# Patient Record
Sex: Female | Born: 1960 | Race: Black or African American | Hispanic: No | Marital: Single | Smoking: Never smoker
Health system: Southern US, Community
[De-identification: ages and names within clinical notes are randomized; demographics above are authoritative.]

## PROBLEM LIST (undated history)

## (undated) DIAGNOSIS — E785 Hyperlipidemia, unspecified: Secondary | ICD-10-CM

## (undated) DIAGNOSIS — E079 Disorder of thyroid, unspecified: Secondary | ICD-10-CM

## (undated) DIAGNOSIS — E119 Type 2 diabetes mellitus without complications: Secondary | ICD-10-CM

## (undated) DIAGNOSIS — E039 Hypothyroidism, unspecified: Secondary | ICD-10-CM

---

## 2005-01-14 ENCOUNTER — Emergency Department: Payer: Self-pay | Admitting: Emergency Medicine

## 2005-01-14 IMAGING — CR DG CHEST 2V
1 series · 2 of 2 positions shown · non-contrast
Comparison: none

REASON FOR EXAM: Nausea
COMMENTS:

PROCEDURE:     DXR - DXR CHEST PA (OR AP) AND LATERAL  - [DATE]  [DATE]
RESULT:     Mild interstitial prominence is noted.  Atypical pneumonitis
cannot be excluded.  The lungs are otherwise clear.  No alveolar infiltrates
are noted.  The cardiovascular structures are unremarkable.

[Series 1396: postero_anterior · 0.11mm/px · 2 of 2 slices shown]
[im 1/2]
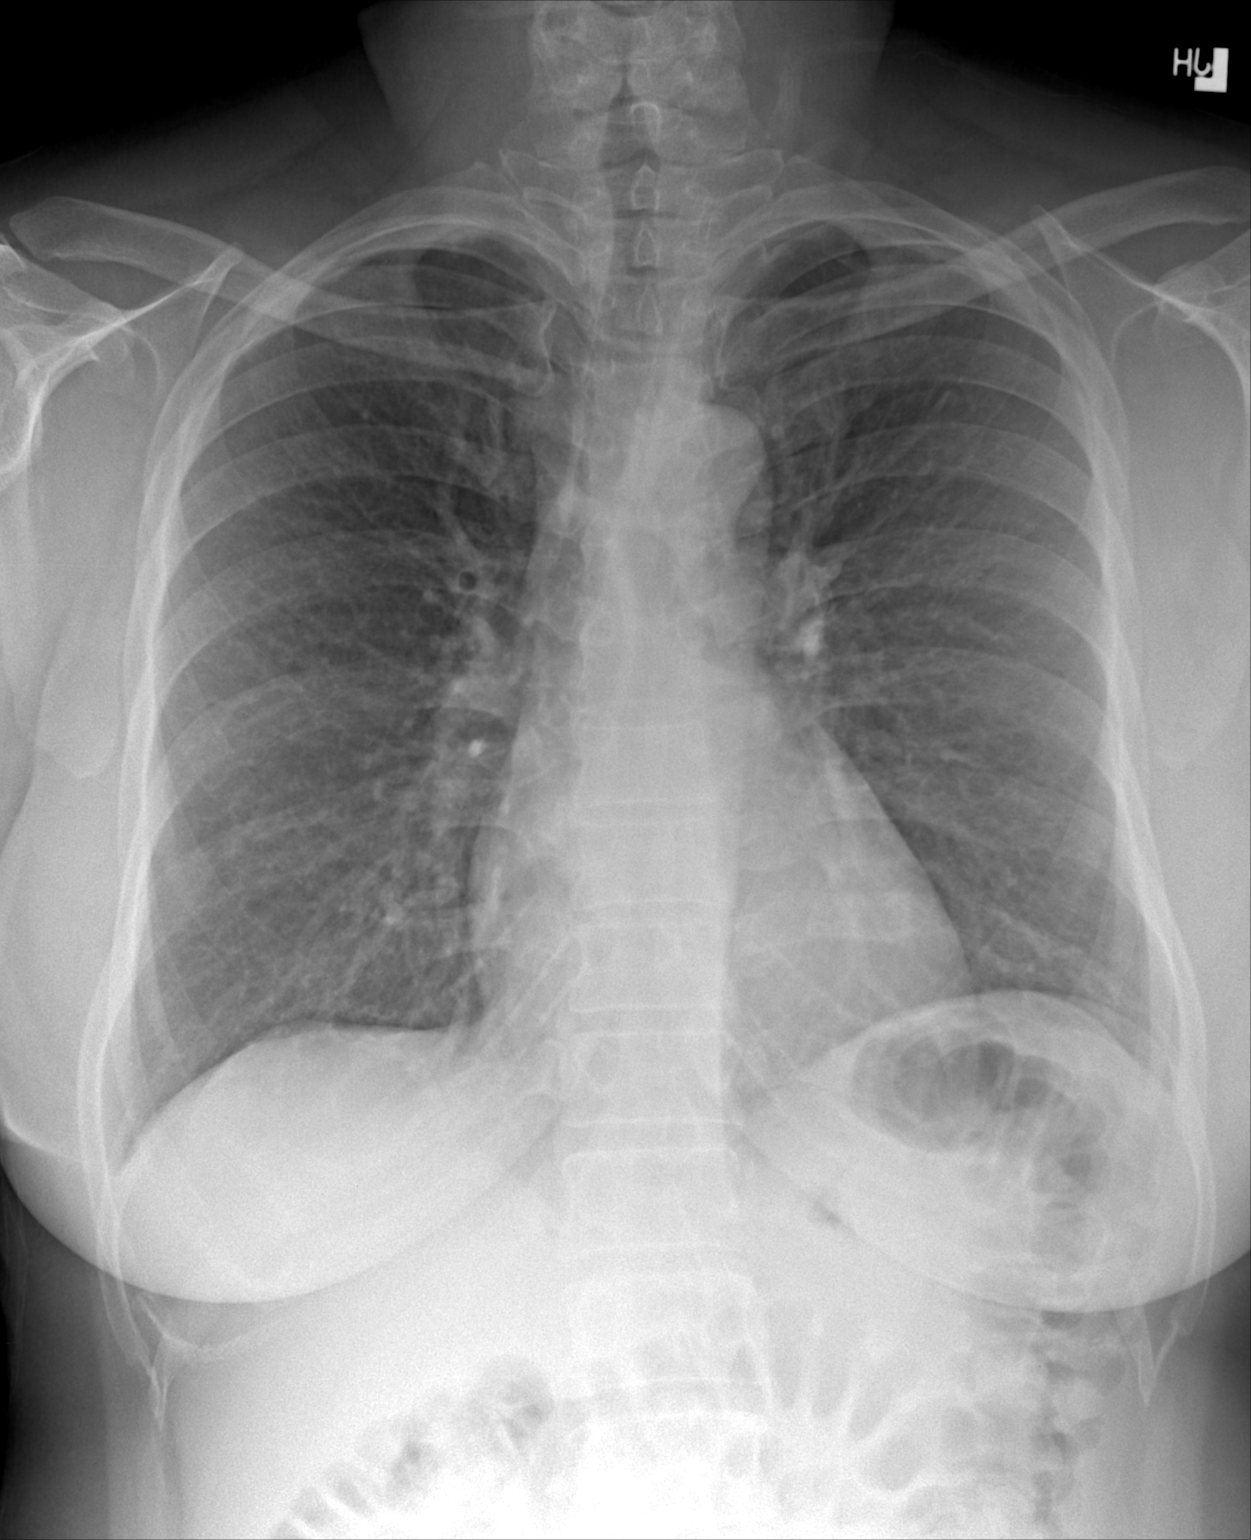
[im 2/2]
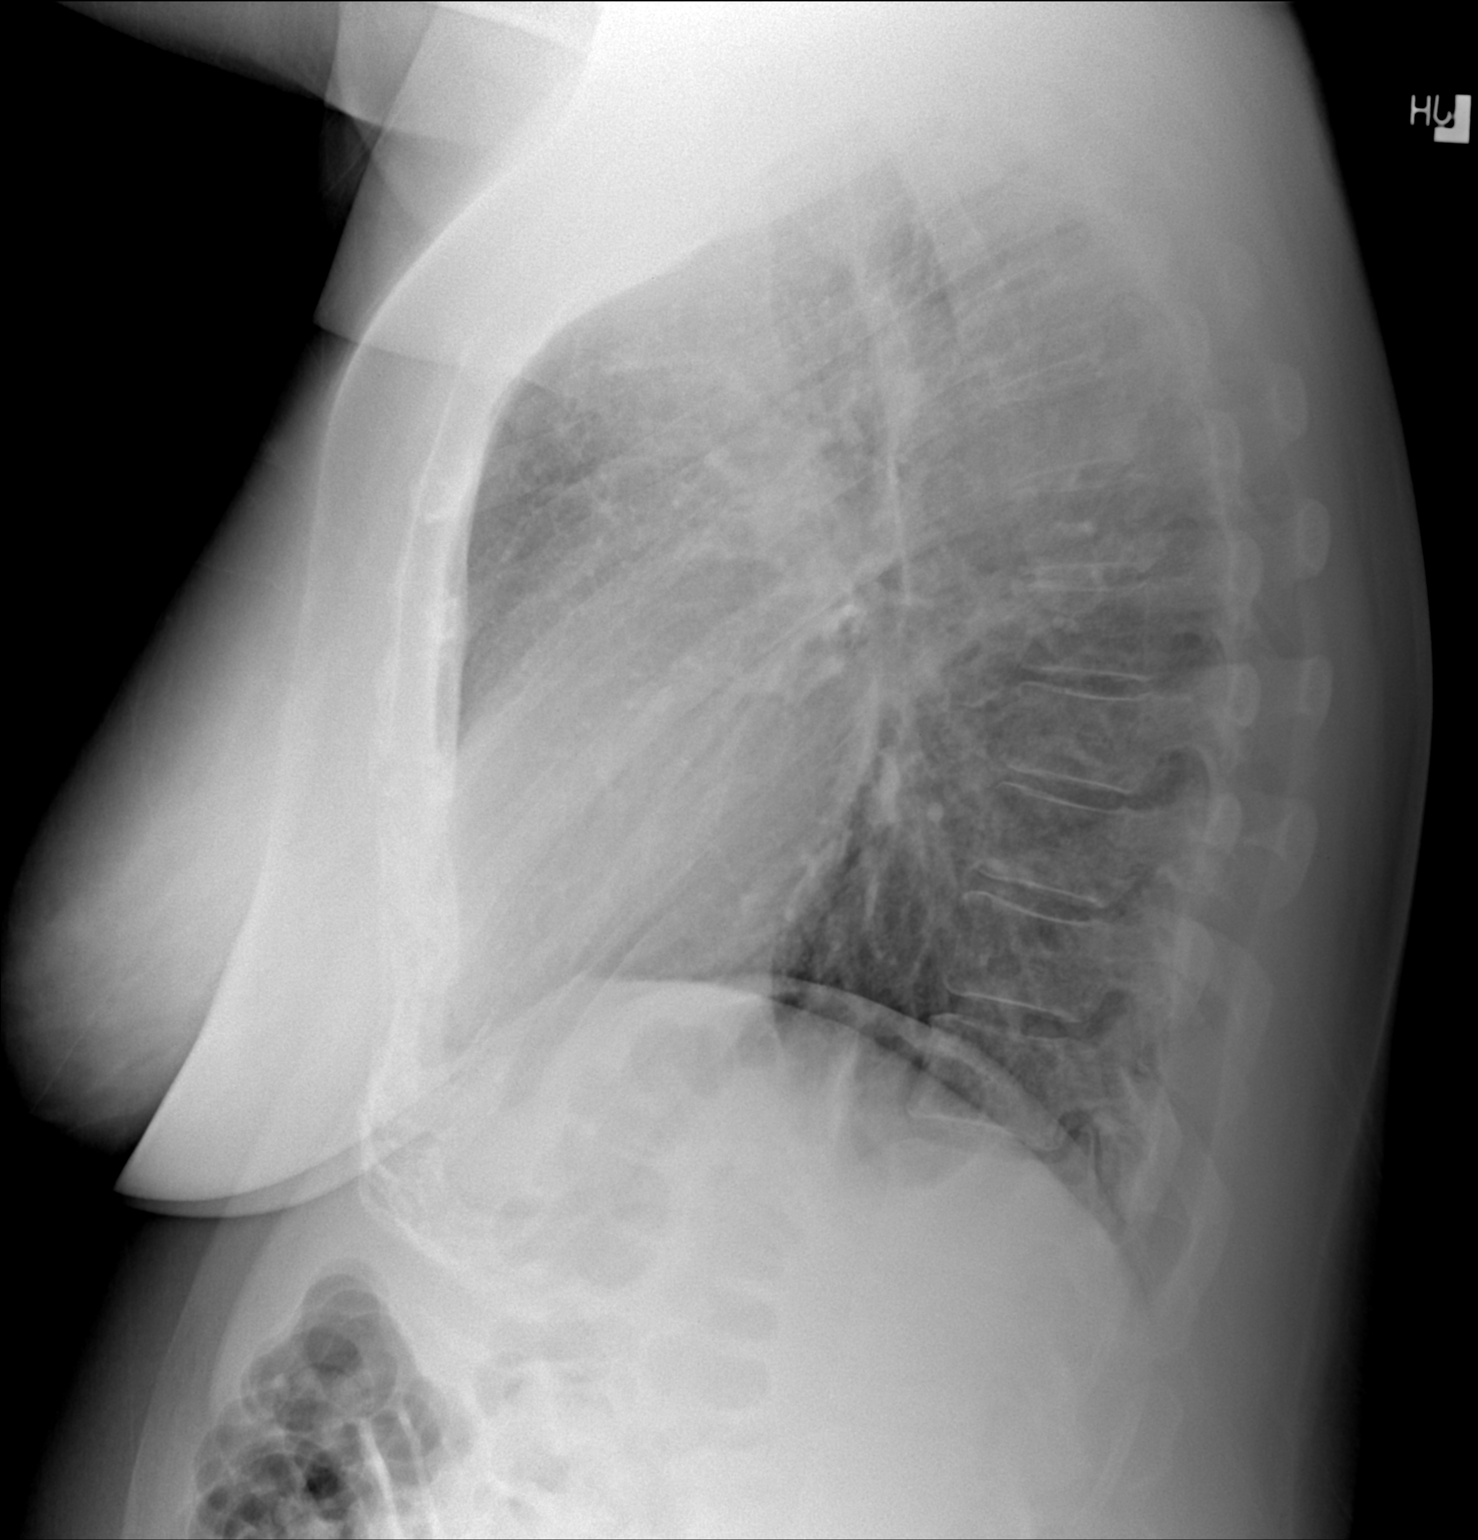

[2 of 2 positions shown; findings below may reference images not displayed]

IMPRESSION: Mild diffuse bilateral interstitial prominence
consistent with atypical pneumonitis.

## 2008-12-30 ENCOUNTER — Emergency Department: Payer: Self-pay | Admitting: Emergency Medicine

## 2010-01-21 ENCOUNTER — Emergency Department: Payer: Self-pay | Admitting: Emergency Medicine

## 2010-01-21 IMAGING — US TRANSABDOMINAL ULTRASOUND OF PELVIS
1 series · 17 of 25 positions shown · non-contrast
Comparison: none

REASON FOR EXAM: menorrhagia
COMMENTS:

[Series 1: transabdominal ultrasound of pelvis · 17 of 30 slices shown]
[im 1/30]
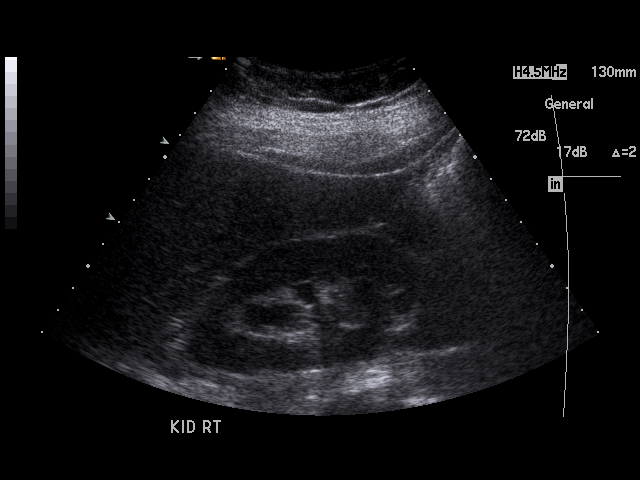
[im 3/30]
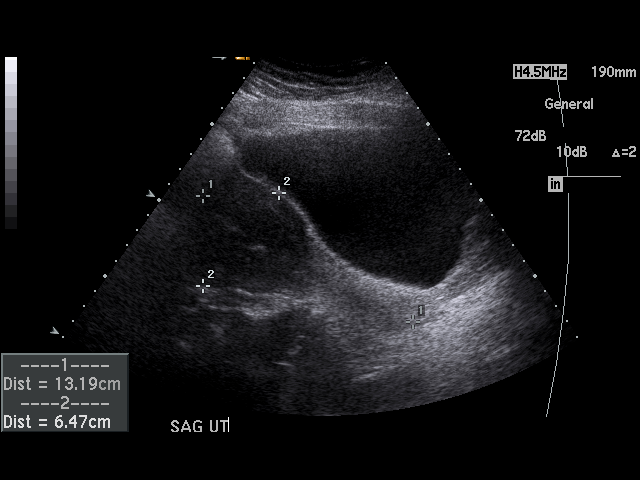
[im 4/30]
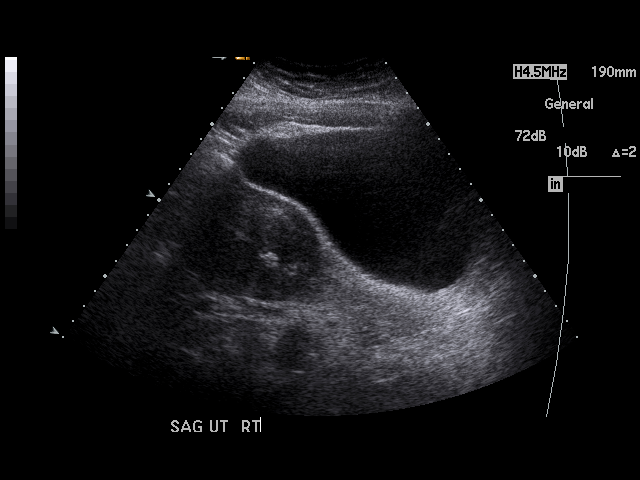
[im 7/30]
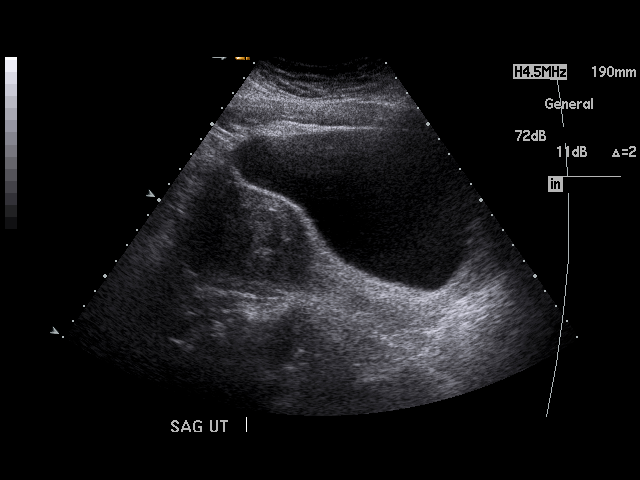
[im 8/30]
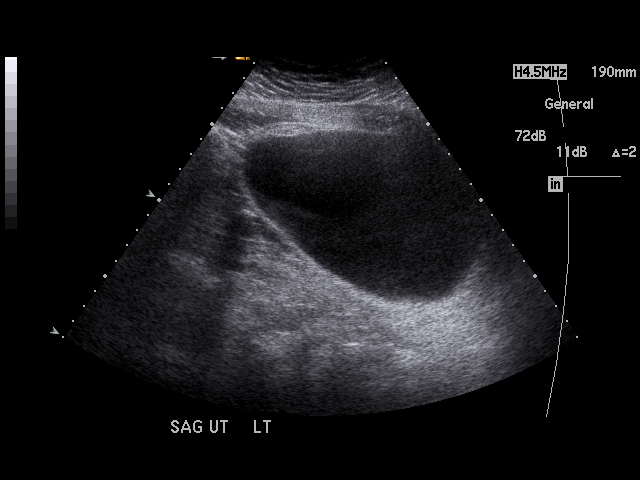
[im 10/30]
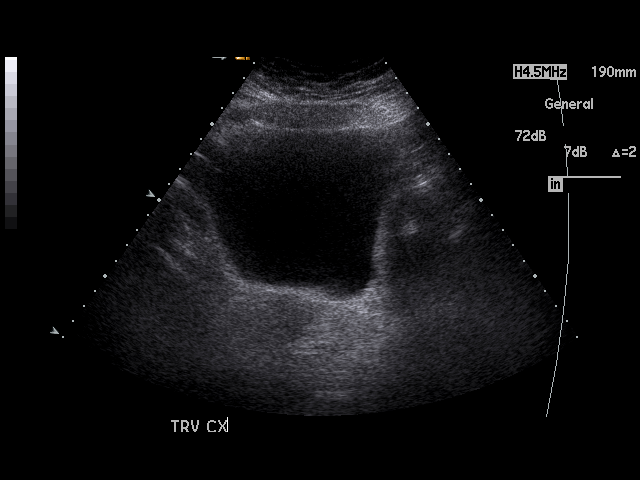
[im 11/30]
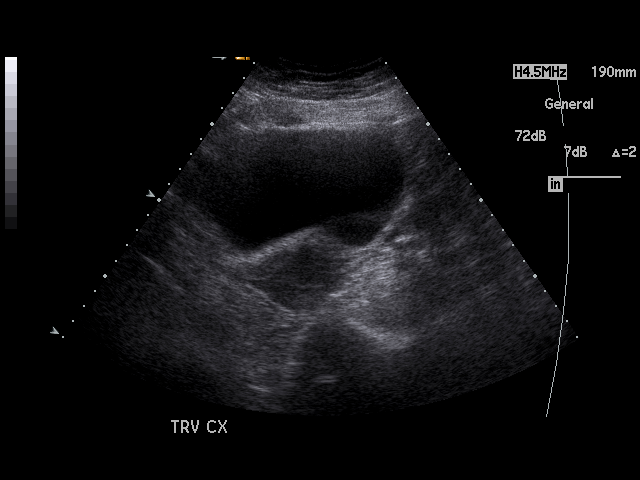
[im 14/30]
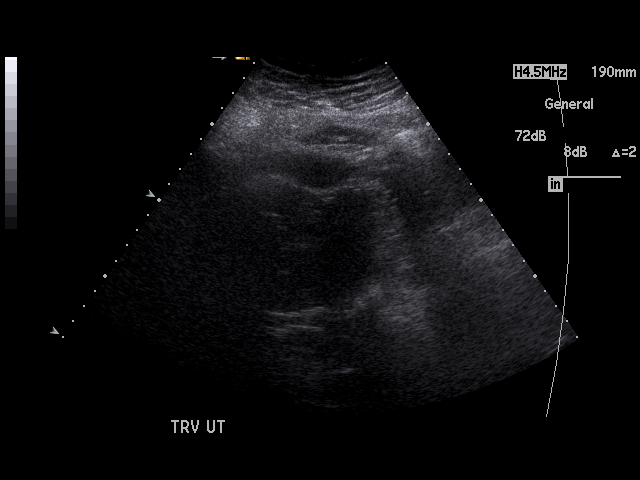
[im 15/30]
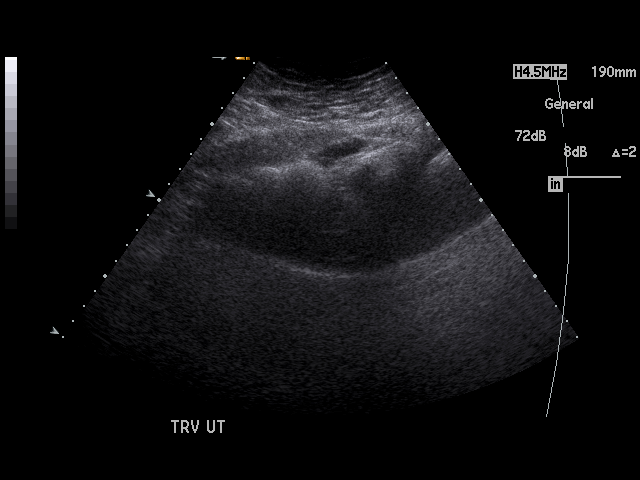
[im 16/30]
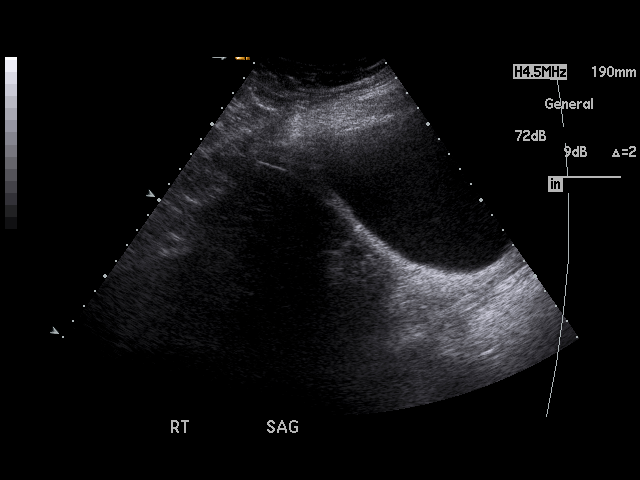
[im 19/30]
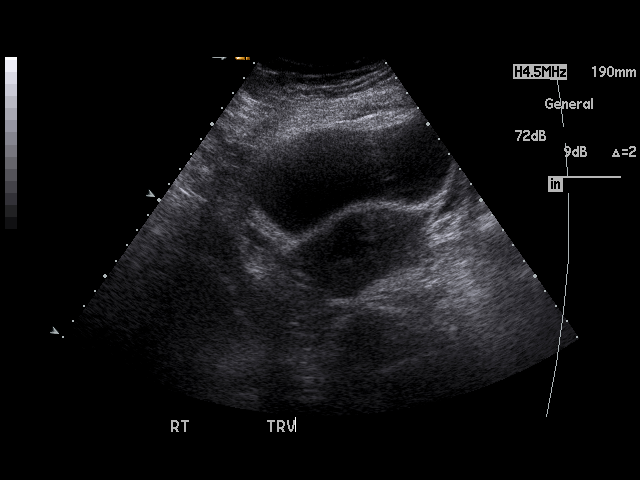
[im 20/30]
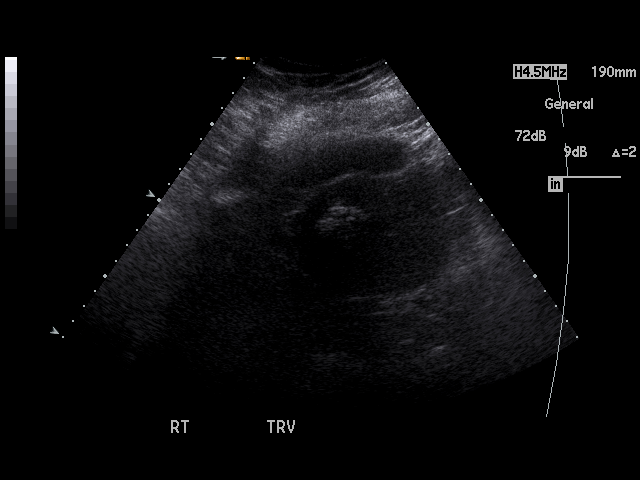
[im 22/30]
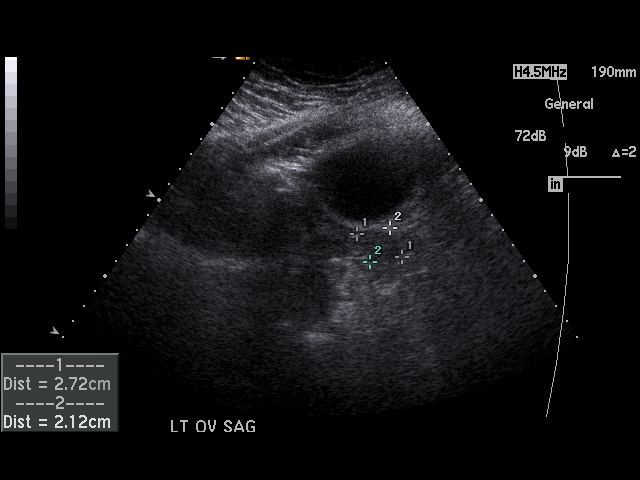
[im 23/30]
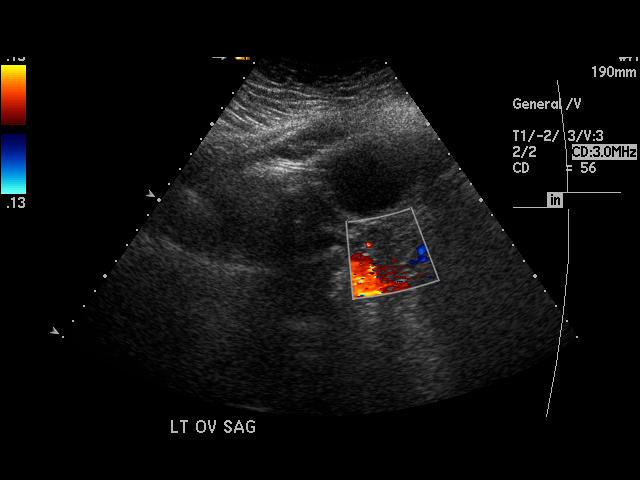
[im 26/30]
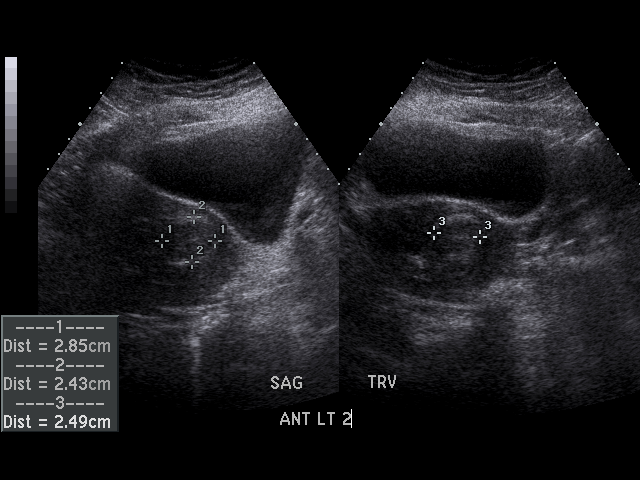
[im 27/30]
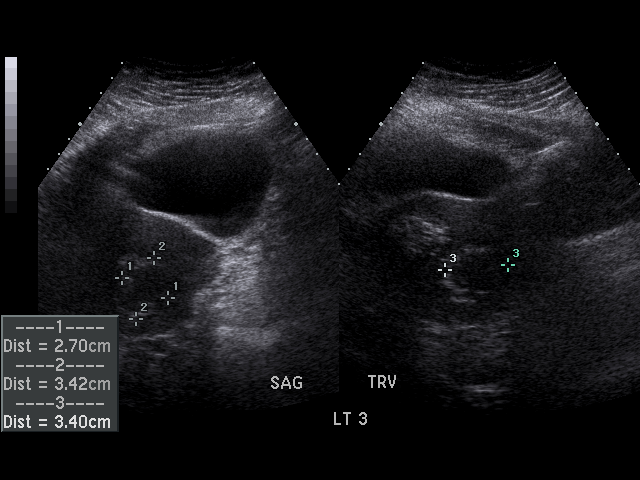
[im 30/30]
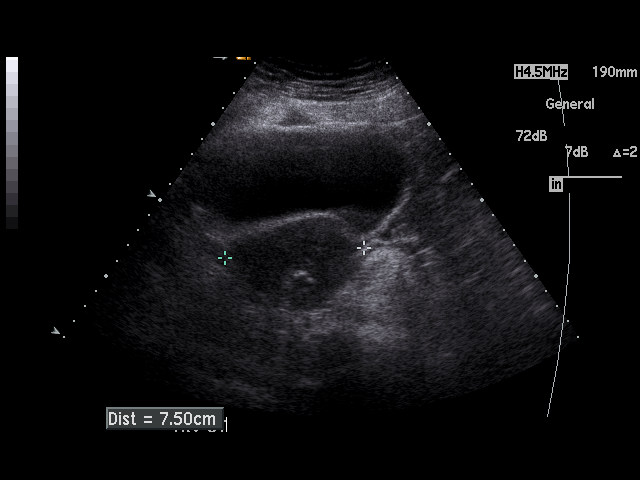

[17 of 25 positions shown; findings below may reference images not displayed]

PROCEDURE:     US  - US PELVIS EXAM  - [DATE]  [DATE]

RESULT:     The uterus is enlarged measuring 13.2 x 6.5 x 7.5 cm. There are
at least 3 hypoechoic foci in the uterine fundus compatible with fibroids.
The endometrial [place] is not abnormally thickened. The right ovary is not
demonstrated. The left ovary is normal in appearance measuring 2.7 x 2.1 x
2.5 cm. There is no free fluid in the cul-de-sac. There is noted the mild
hydronephrosis on the right.
IMPRESSION: 1. The uterus is enlarged and contains at least 3 fibroids with the largest
measuring 3.7 cm in greatest dimension.
2. The right ovary could not be demonstrated. The left ovary appeared normal.
3. There is mild hydronephrosis on the right.

## 2012-05-22 ENCOUNTER — Ambulatory Visit: Payer: Self-pay

## 2012-05-22 IMAGING — MG MM DIGITAL SCREENING BILAT W/ CAD
1 series · 4 of 4 positions shown · non-contrast
Comparison: none

REASON FOR EXAM: screening
COMMENTS:

PROCEDURE:     MAM - MAM [REDACTED] DIG SCREEN MAM W/CAD  - [DATE] [DATE]
RESULT:     Bilateral digital screening mammogram with CAD dated [DATE].

[R CC · right · 4 of 4 slices shown]
[im 1/4]
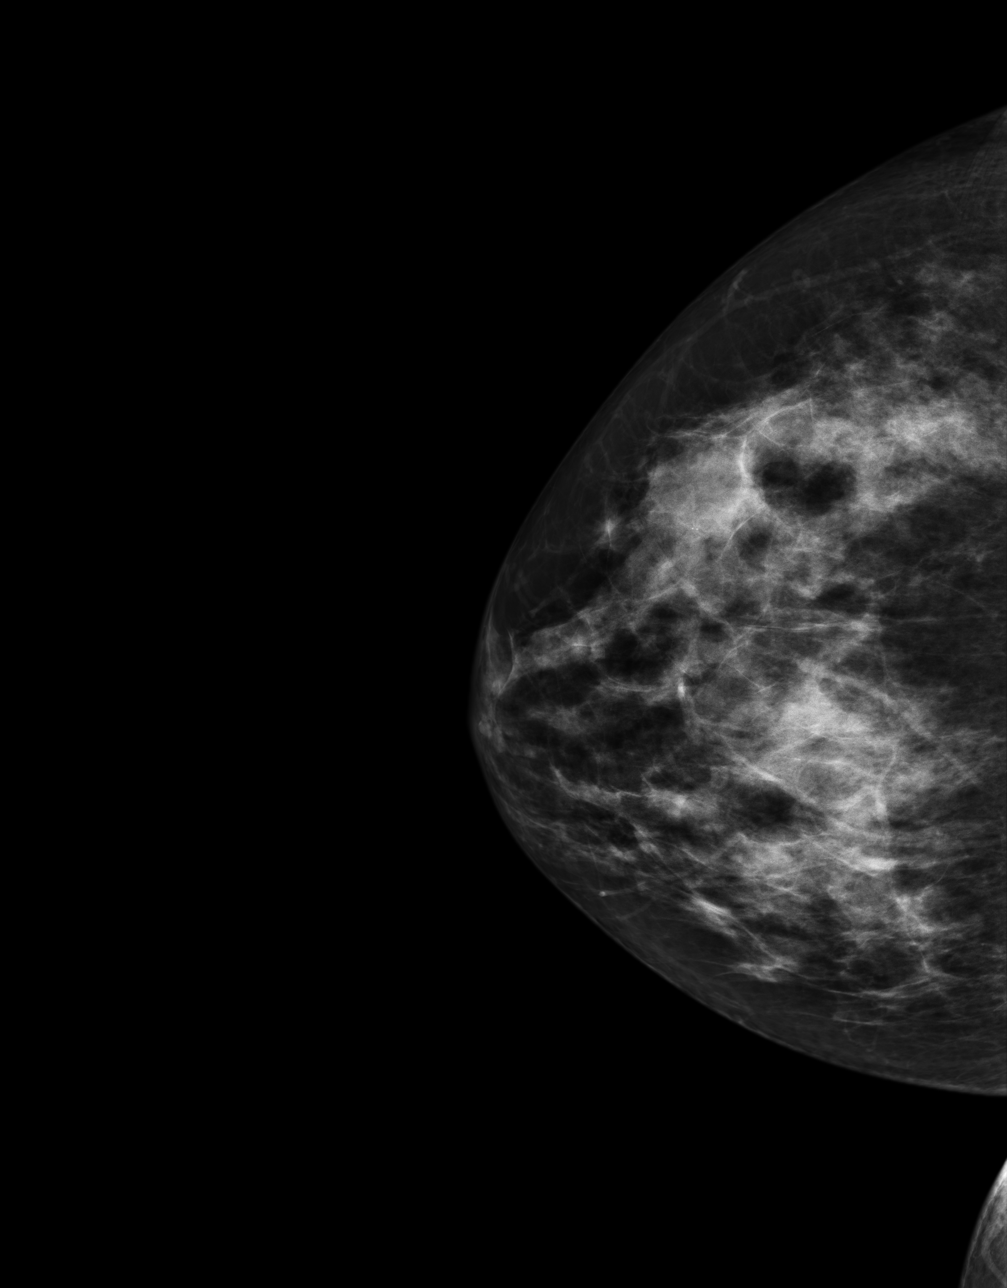
[im 2/4]
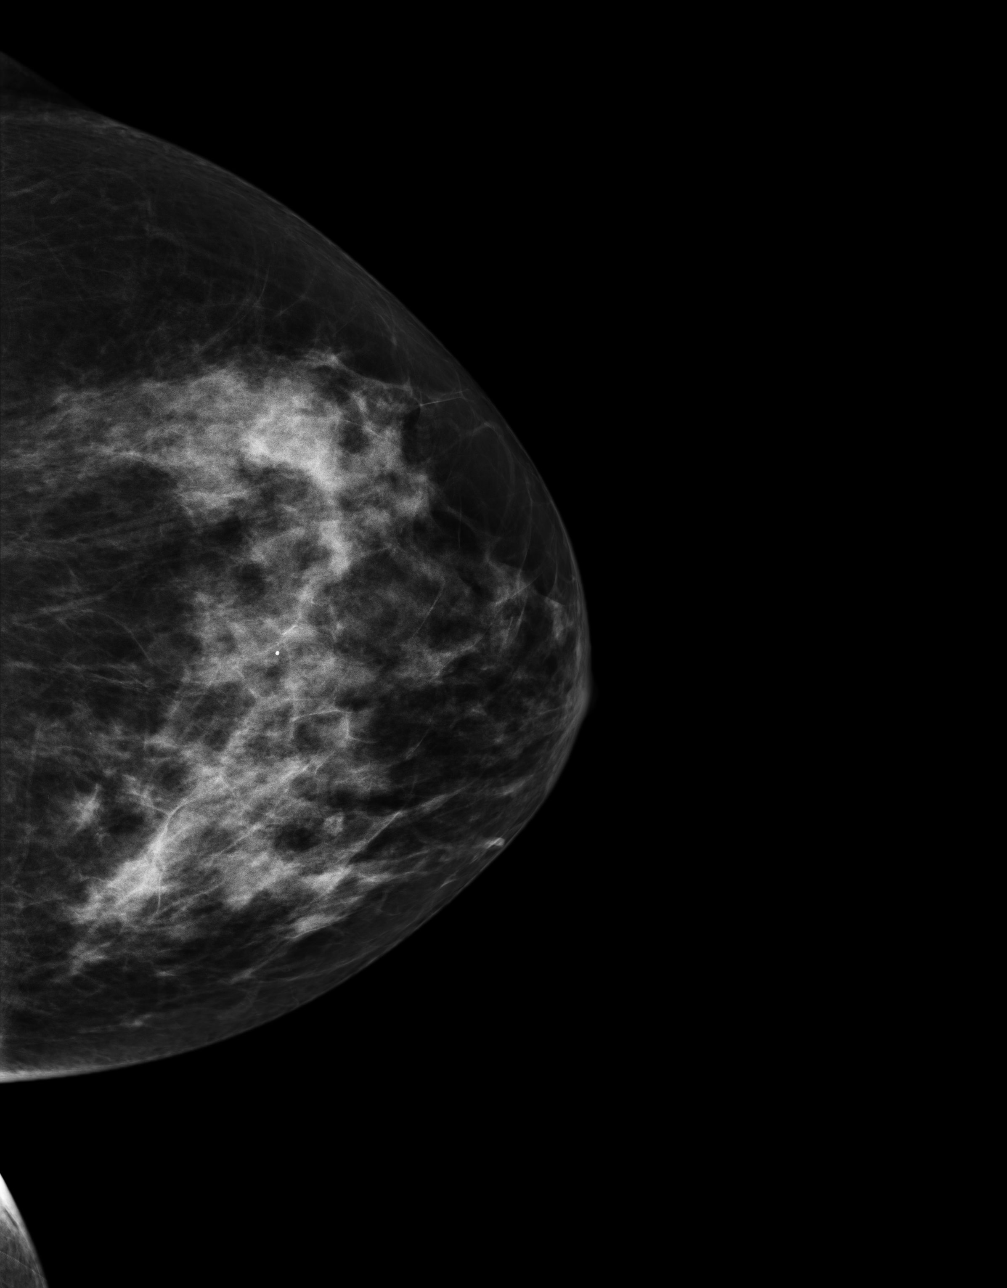
[im 3/4]
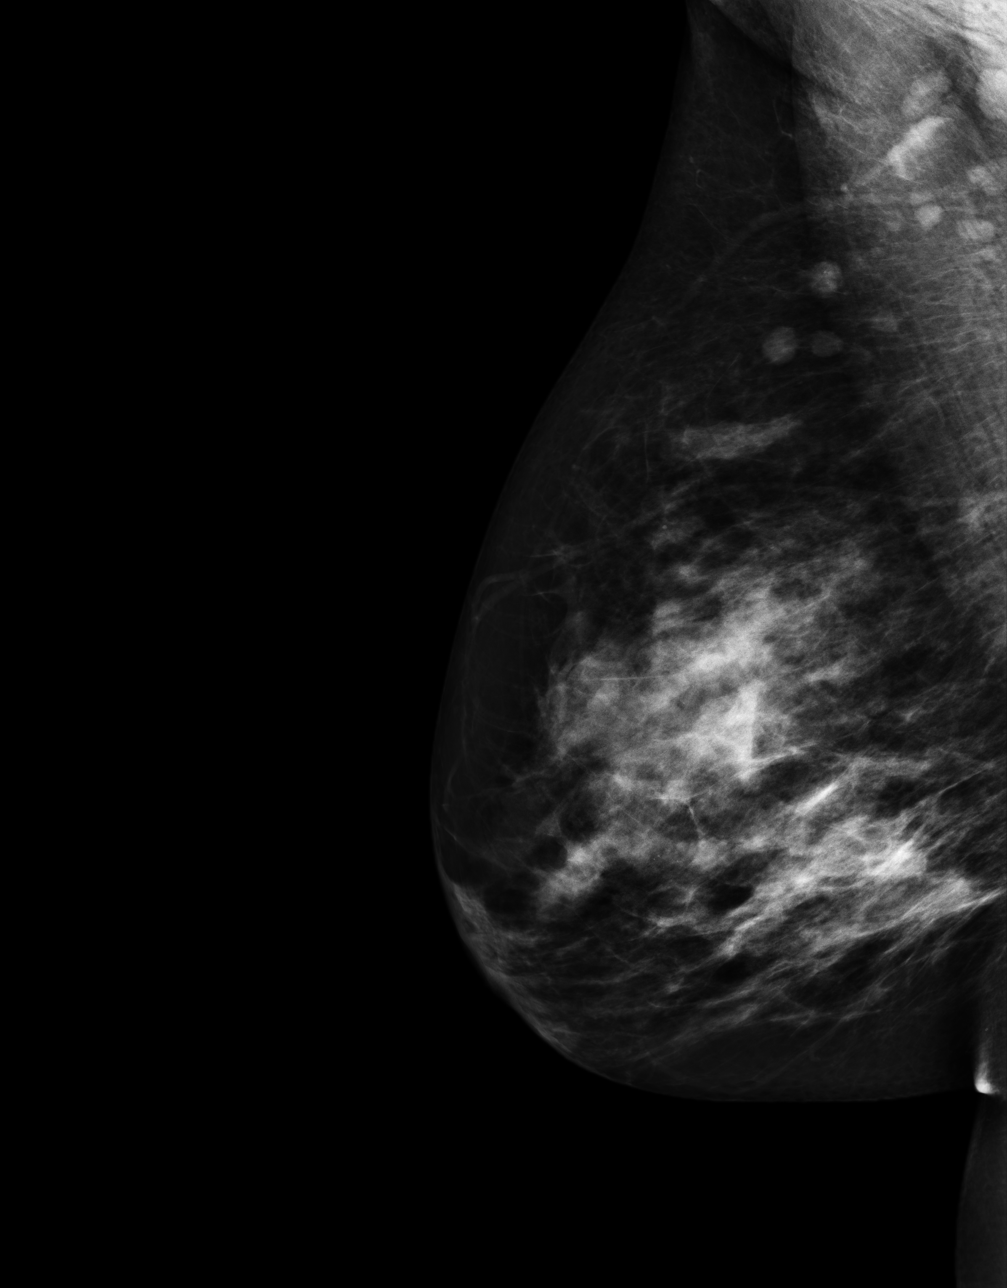
[im 4/4]
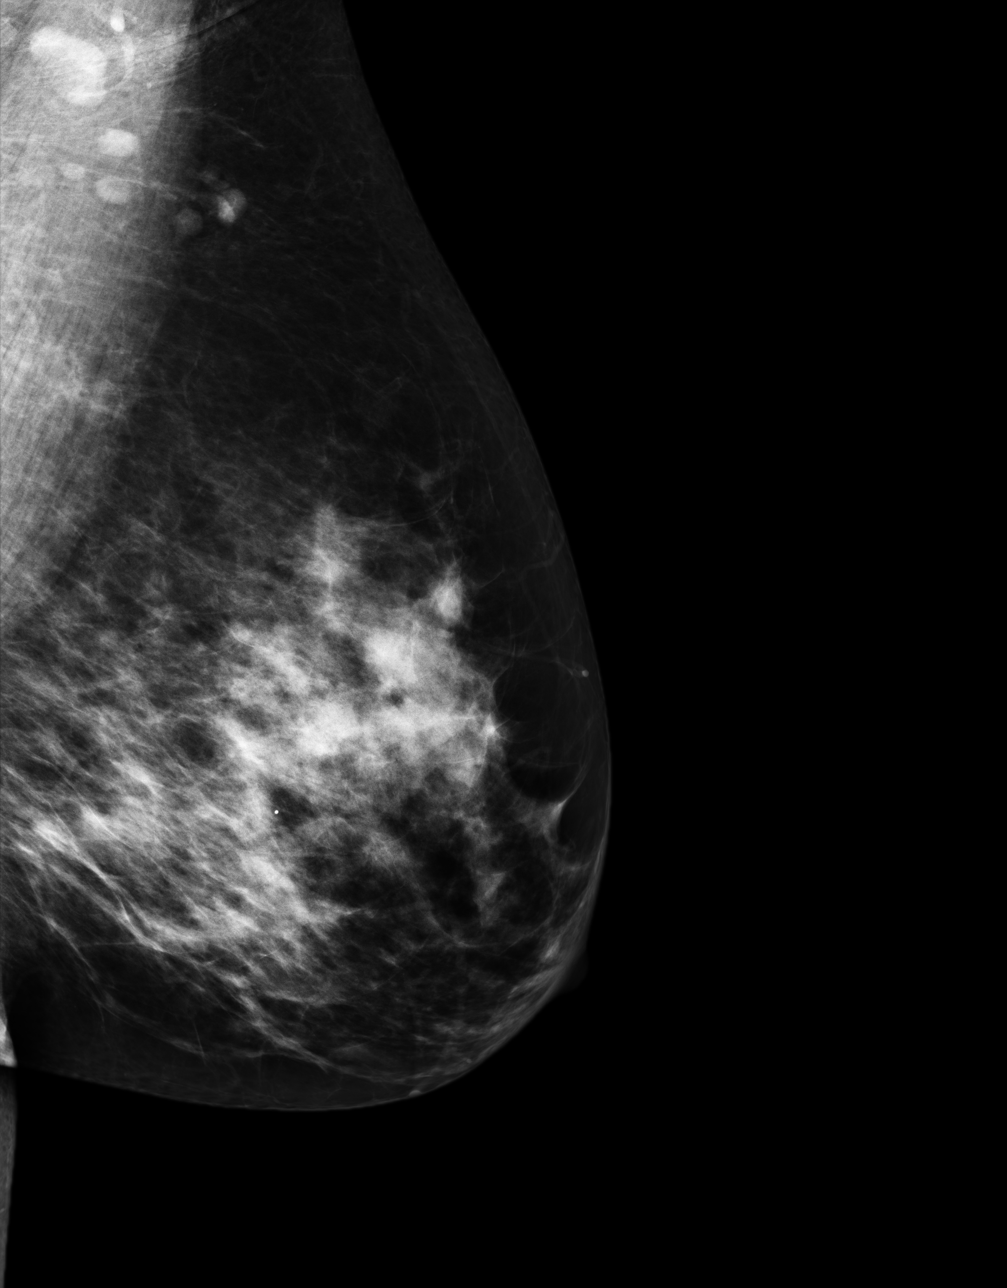

[4 of 4 positions shown; findings below may reference images not displayed]

FINDINGS: BREAST COMPOSITION: The breast composition is SCATTERED
FIBROGLANDULAR TISSUE (glandular tissue is 25-50%)

Areas of asymmetric density projects in the superior portion of the left
breast. Further evaluation medication breast imaging recommended. Is
otherwise no further radiographic evidence to suggest one superior
IMPRESSION: BI-RADS: Category 0 - Need Additional Imaging Evaluation

A NEGATIVE MAMMOGRAM REPORT DOES NOT PRECLUDE BIOPSY OR OTHER EVALUATION OF
A CLINICALLY PALPABLE OR OTHERWISE SUSPICIOUS MASS OR LESION. BREAST CANCER
SORNALY NOT BE DETECTED IN UP TO 10% OF CASES.

## 2012-05-30 ENCOUNTER — Ambulatory Visit: Payer: Self-pay

## 2012-05-30 IMAGING — MG MM ADDITIONAL VIEWS AT NO CHARGE
1 series · 3 of 3 positions shown · non-contrast
Comparison: none

REASON FOR EXAM: av lt asymmetric density [REDACTED]
COMMENTS:

PROCEDURE:     MAM - MAM [REDACTED] ADDED VIEW DIG LEFT  - [DATE]  [DATE]
RESULT:     Additional views left breast dated [DATE]

[L ML · left · 3 of 3 slices shown]
[im 1/3]
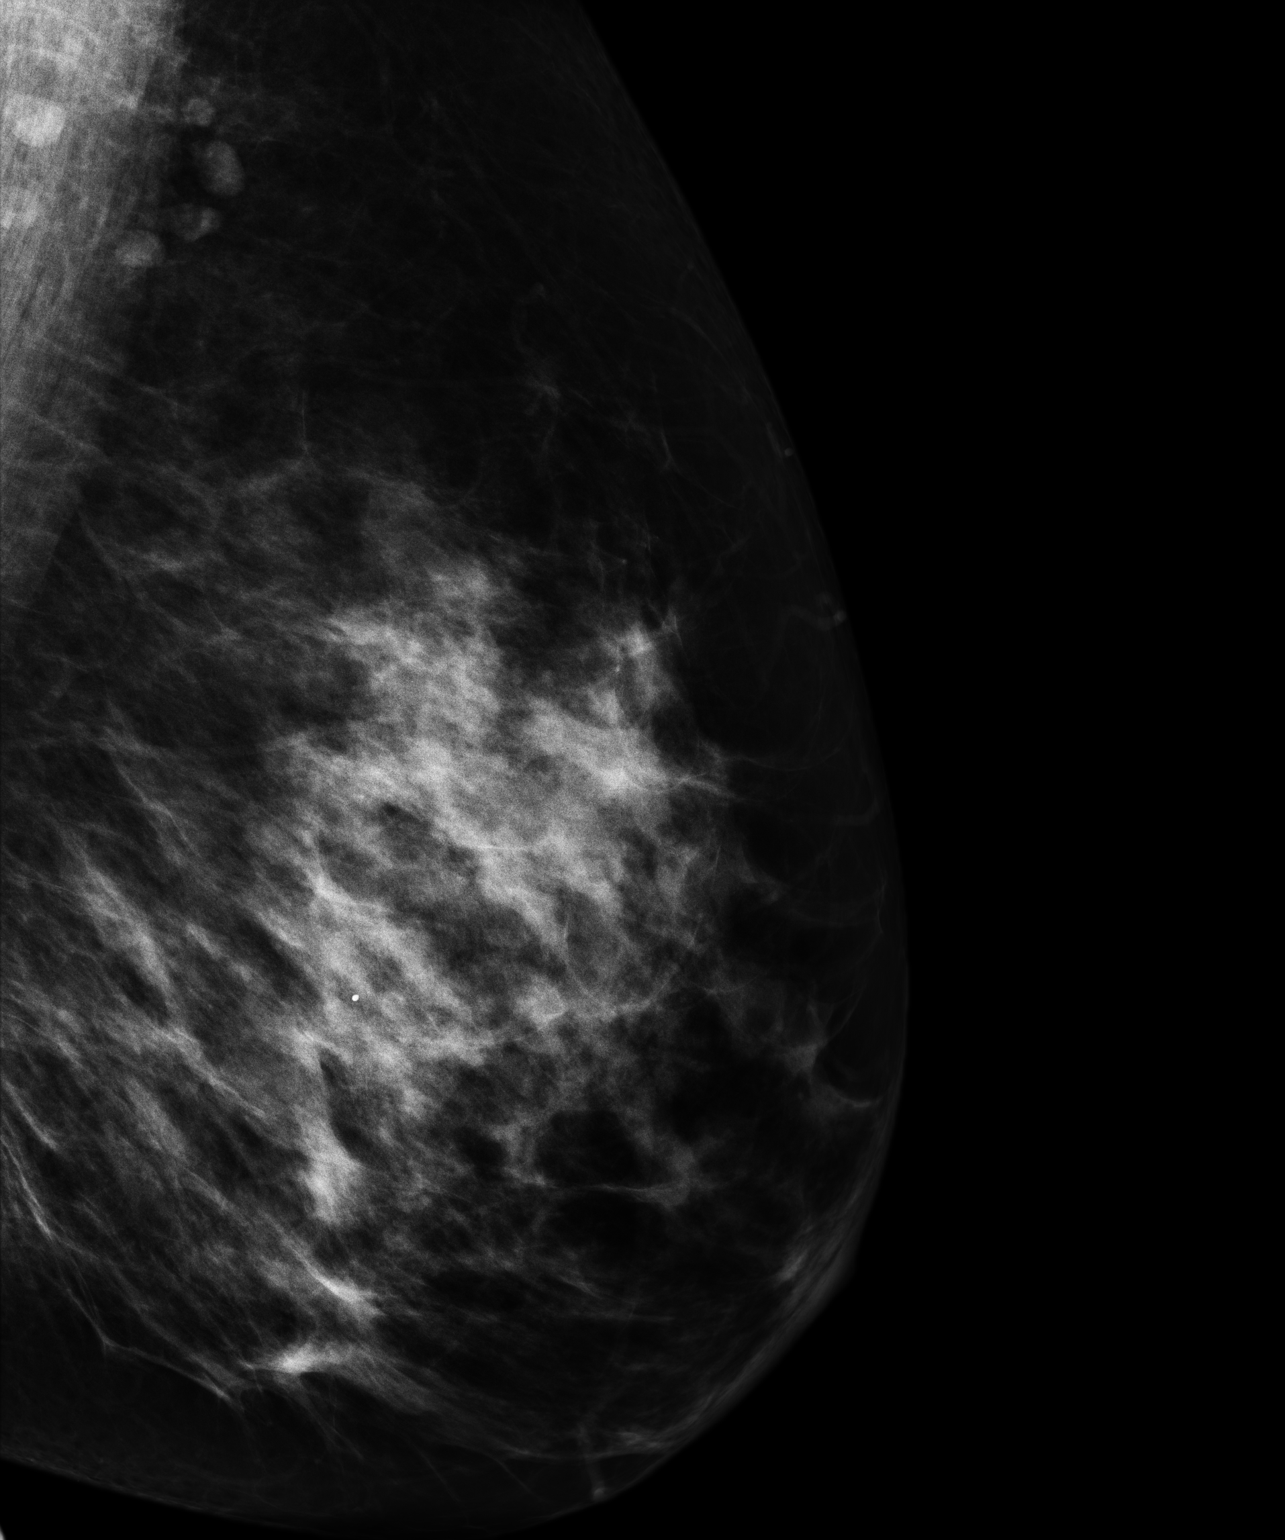
[im 2/3]
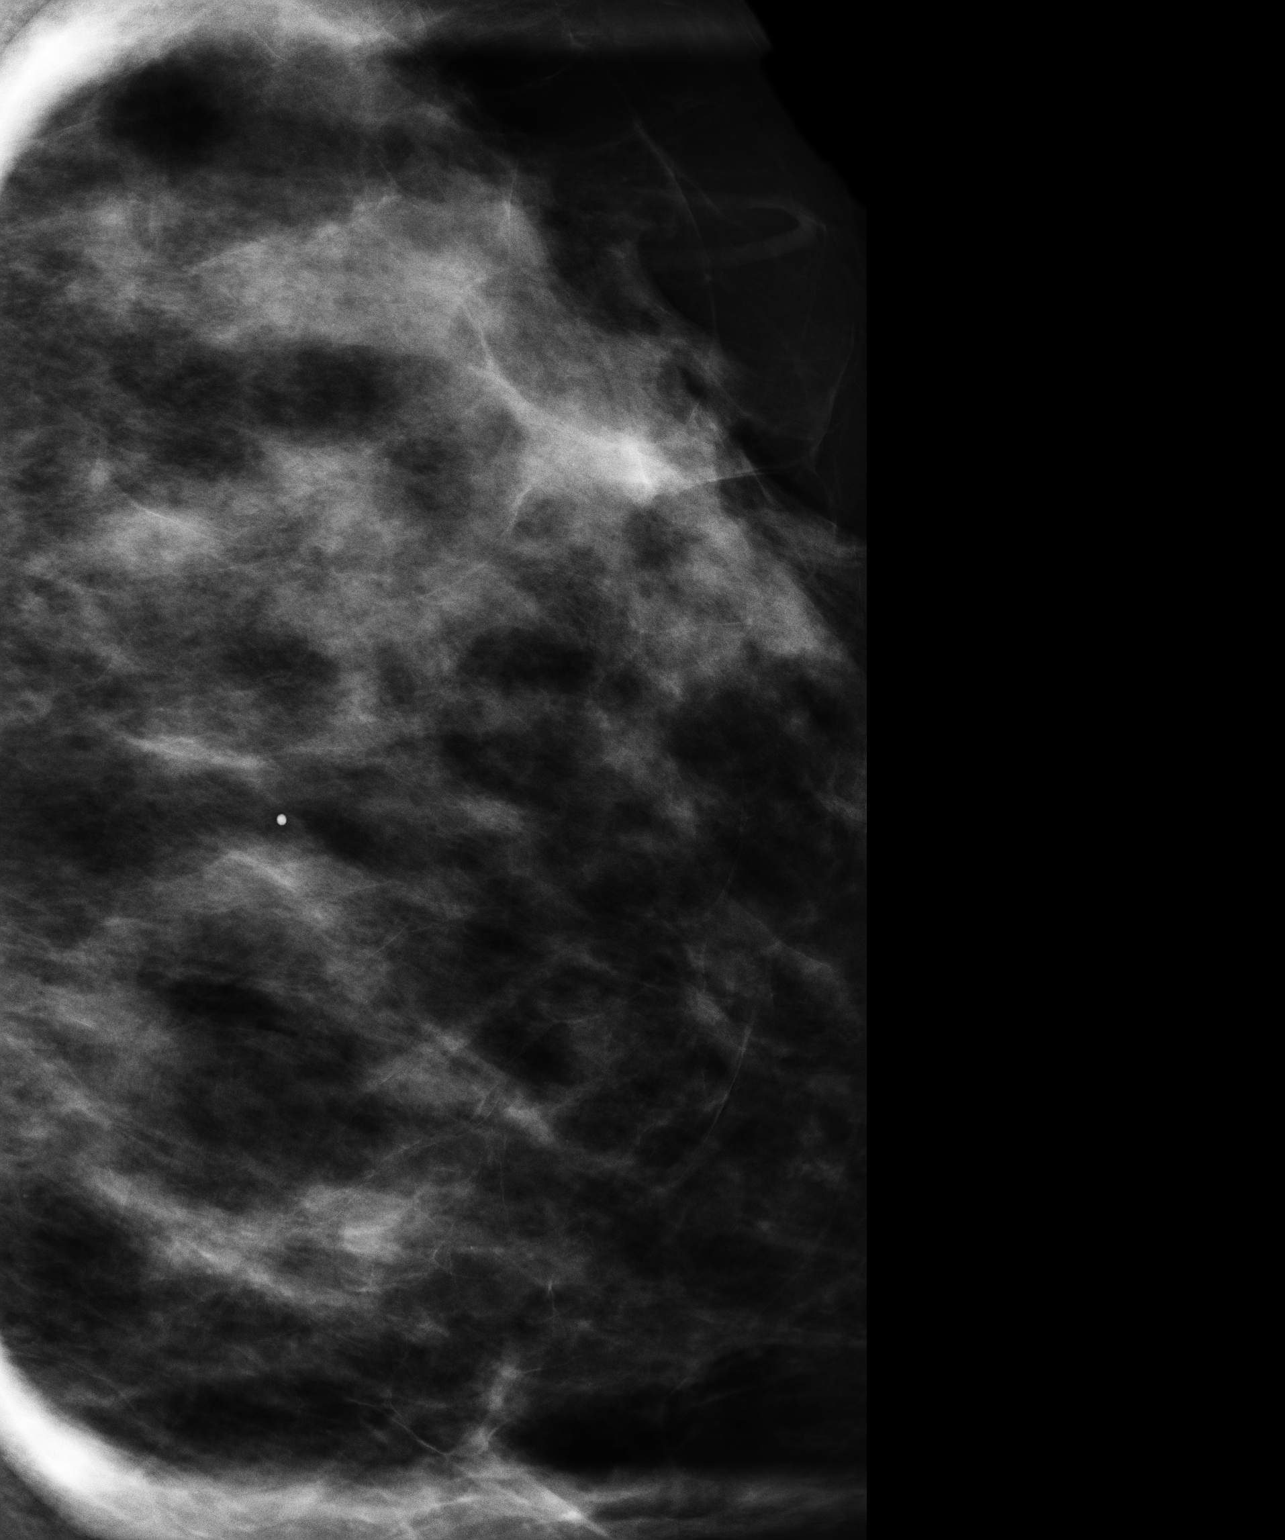
[im 3/3]
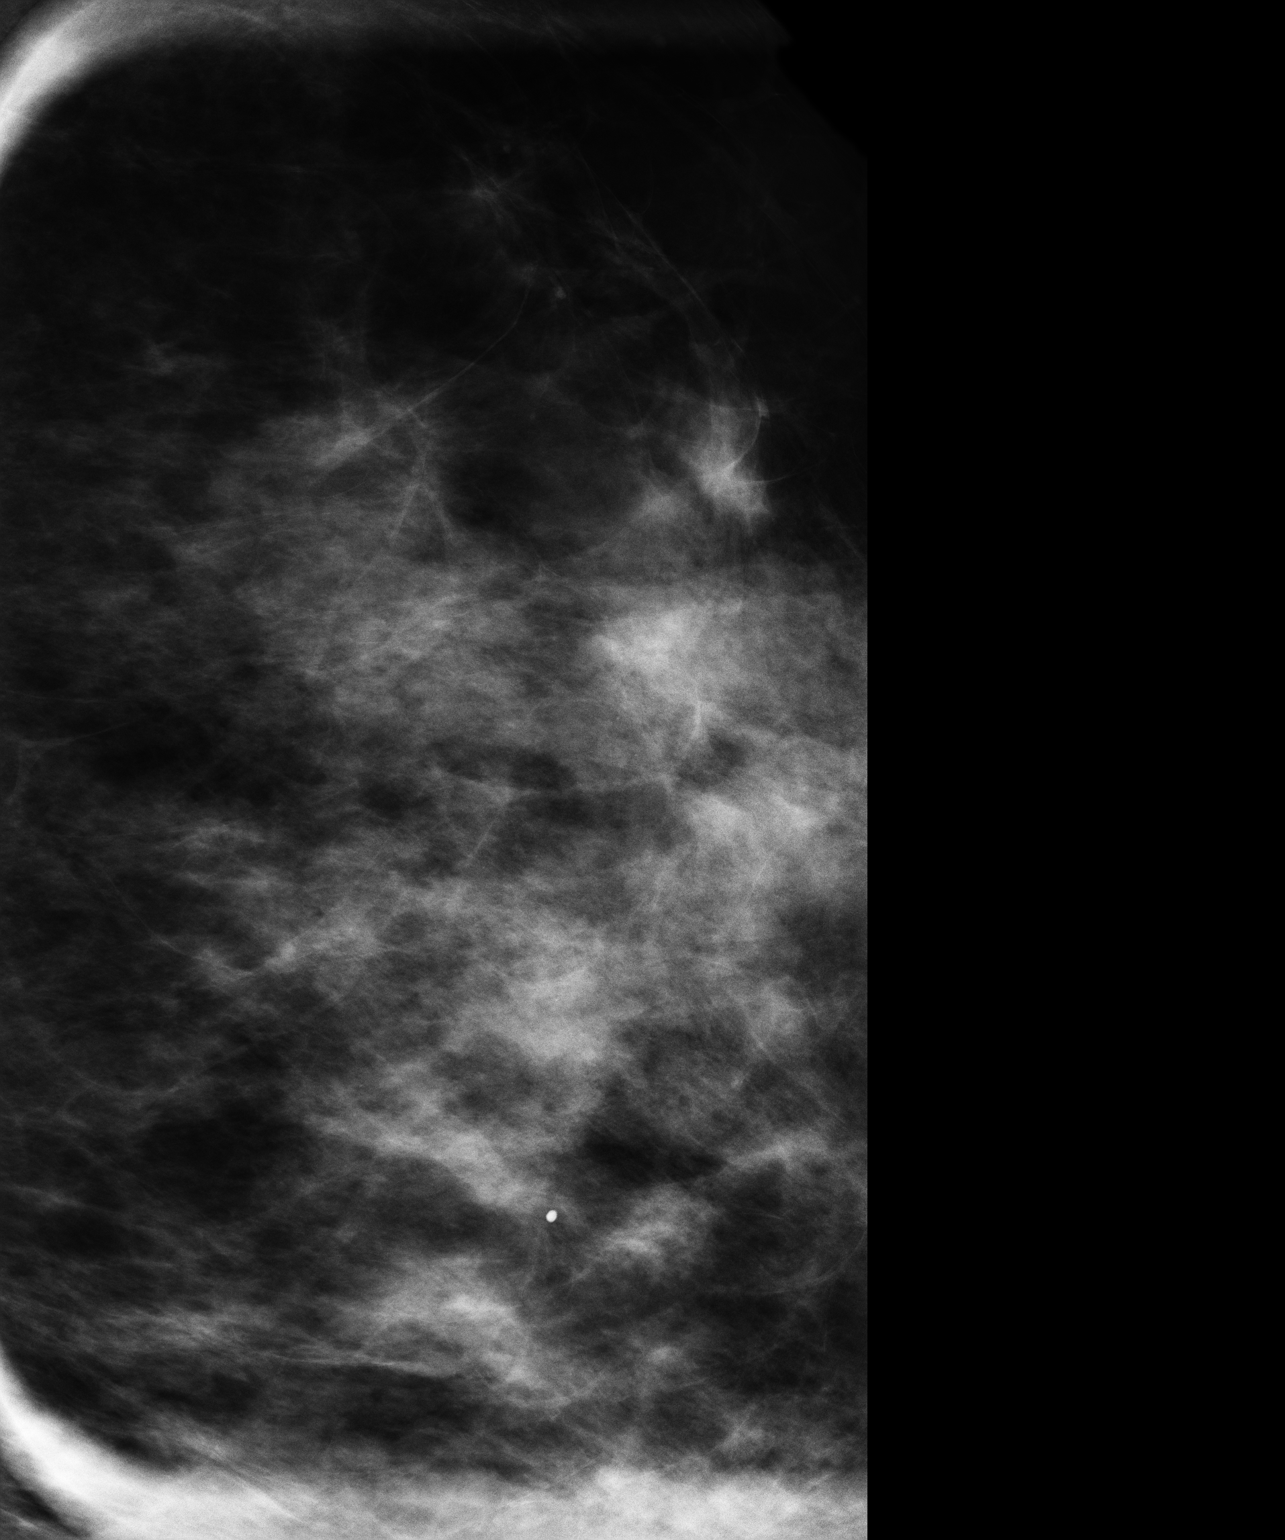

[3 of 3 positions shown; findings below may reference images not displayed]

FINDINGS: The regions of concern within the left breast efface with
compression.
IMPRESSION: BI-RADS: Category 2- Benign Finding

A NEGATIVE MAMMOGRAM REPORT DOES NOT PRECLUDE BIOPSY OR OTHER EVALUATION OF
A CLINICALLY PALPABLE OR OTHERWISE SUSPICIOUS MASS OR LESION. BREAST CANCER
MAY NOT BE DETECTED IN UP TO 10% OF CASES.

## 2012-08-15 DIAGNOSIS — Z0000 Encounter for general adult medical examination without abnormal findings: Secondary | ICD-10-CM | POA: Insufficient documentation

## 2012-08-15 DIAGNOSIS — L819 Disorder of pigmentation, unspecified: Secondary | ICD-10-CM | POA: Insufficient documentation

## 2013-01-01 DIAGNOSIS — E0500 Thyrotoxicosis with diffuse goiter without thyrotoxic crisis or storm: Secondary | ICD-10-CM | POA: Insufficient documentation

## 2013-01-01 DIAGNOSIS — R824 Acetonuria: Secondary | ICD-10-CM | POA: Insufficient documentation

## 2013-01-01 DIAGNOSIS — E1110 Type 2 diabetes mellitus with ketoacidosis without coma: Secondary | ICD-10-CM | POA: Insufficient documentation

## 2013-01-01 DIAGNOSIS — R739 Hyperglycemia, unspecified: Secondary | ICD-10-CM | POA: Insufficient documentation

## 2013-01-06 DIAGNOSIS — E1069 Type 1 diabetes mellitus with other specified complication: Secondary | ICD-10-CM | POA: Insufficient documentation

## 2013-01-06 DIAGNOSIS — E109 Type 1 diabetes mellitus without complications: Secondary | ICD-10-CM | POA: Insufficient documentation

## 2013-06-02 ENCOUNTER — Ambulatory Visit: Payer: Self-pay

## 2013-06-02 IMAGING — MG MM DIGITAL SCREENING BILAT W/ CAD
1 series · 4 of 4 positions shown · non-contrast
Comparison: Previous exam(s).

CLINICAL DATA: Screening.

EXAM:
DIGITAL SCREENING BILATERAL MAMMOGRAM WITH CAD

[R CC · right · 4 of 4 slices shown]
[im 1/4]
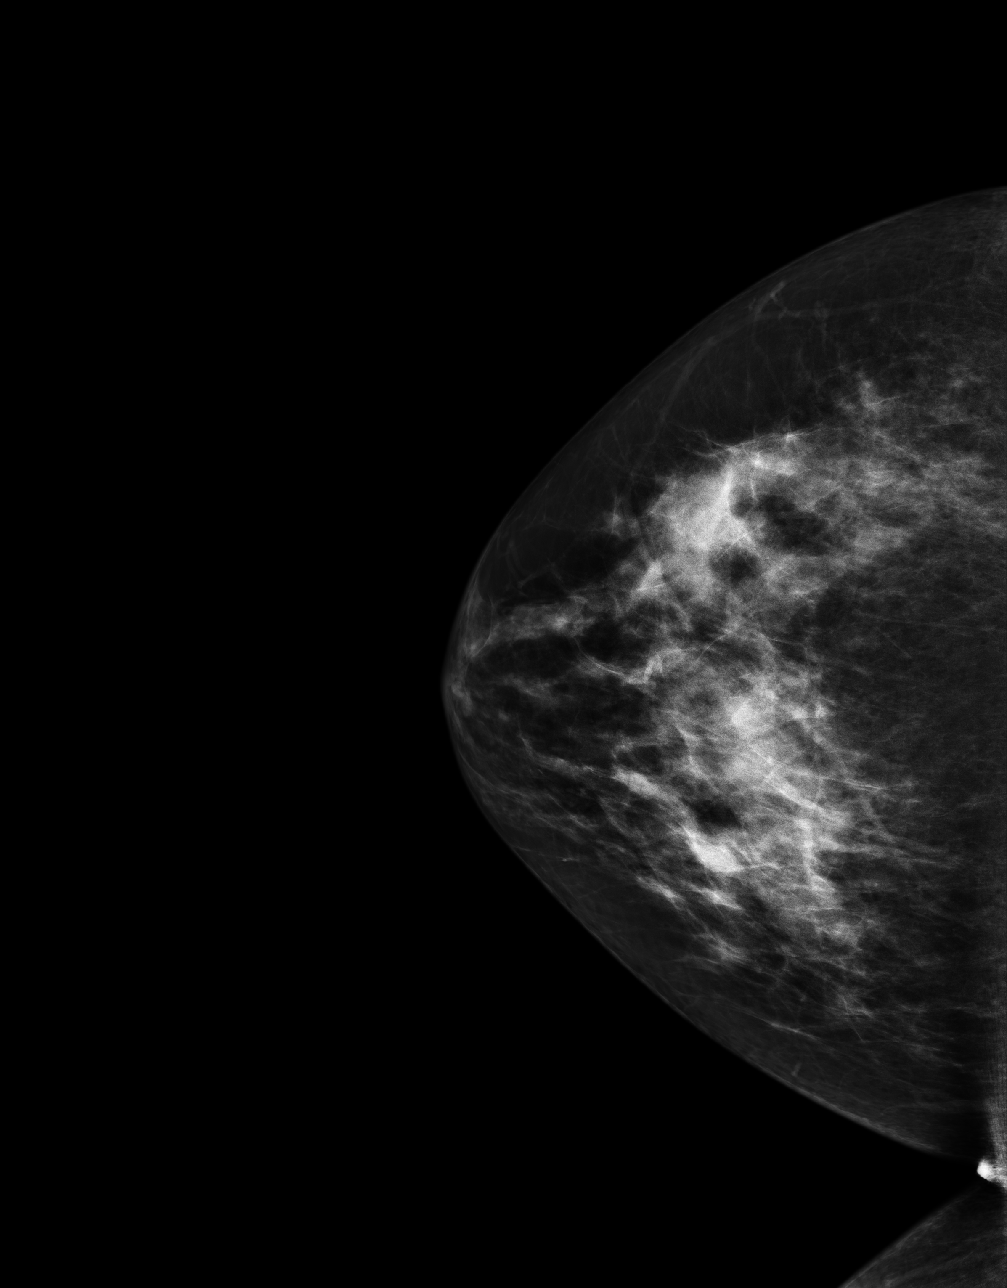
[im 2/4]
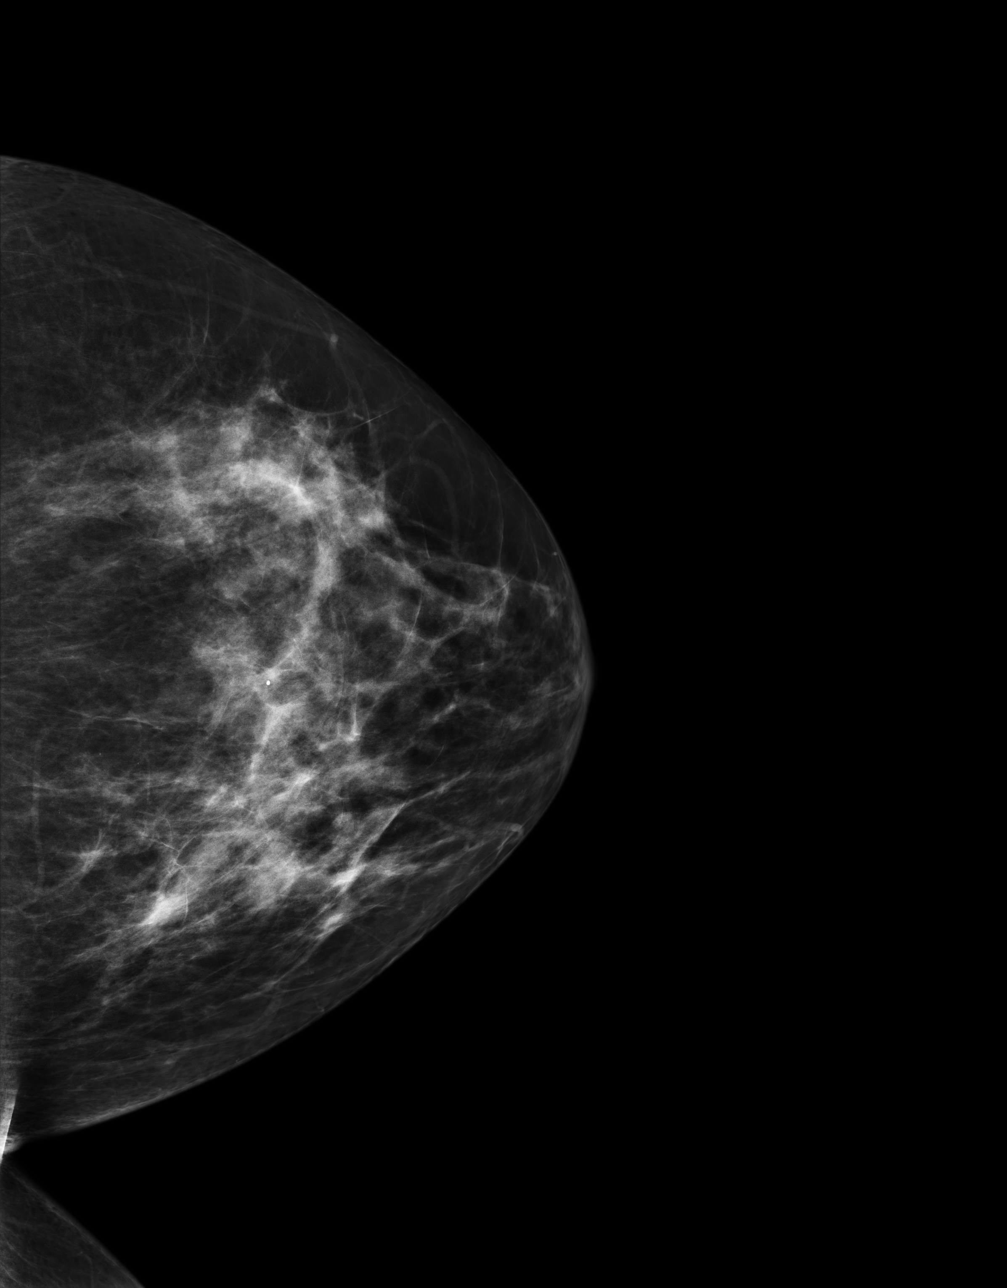
[im 3/4]
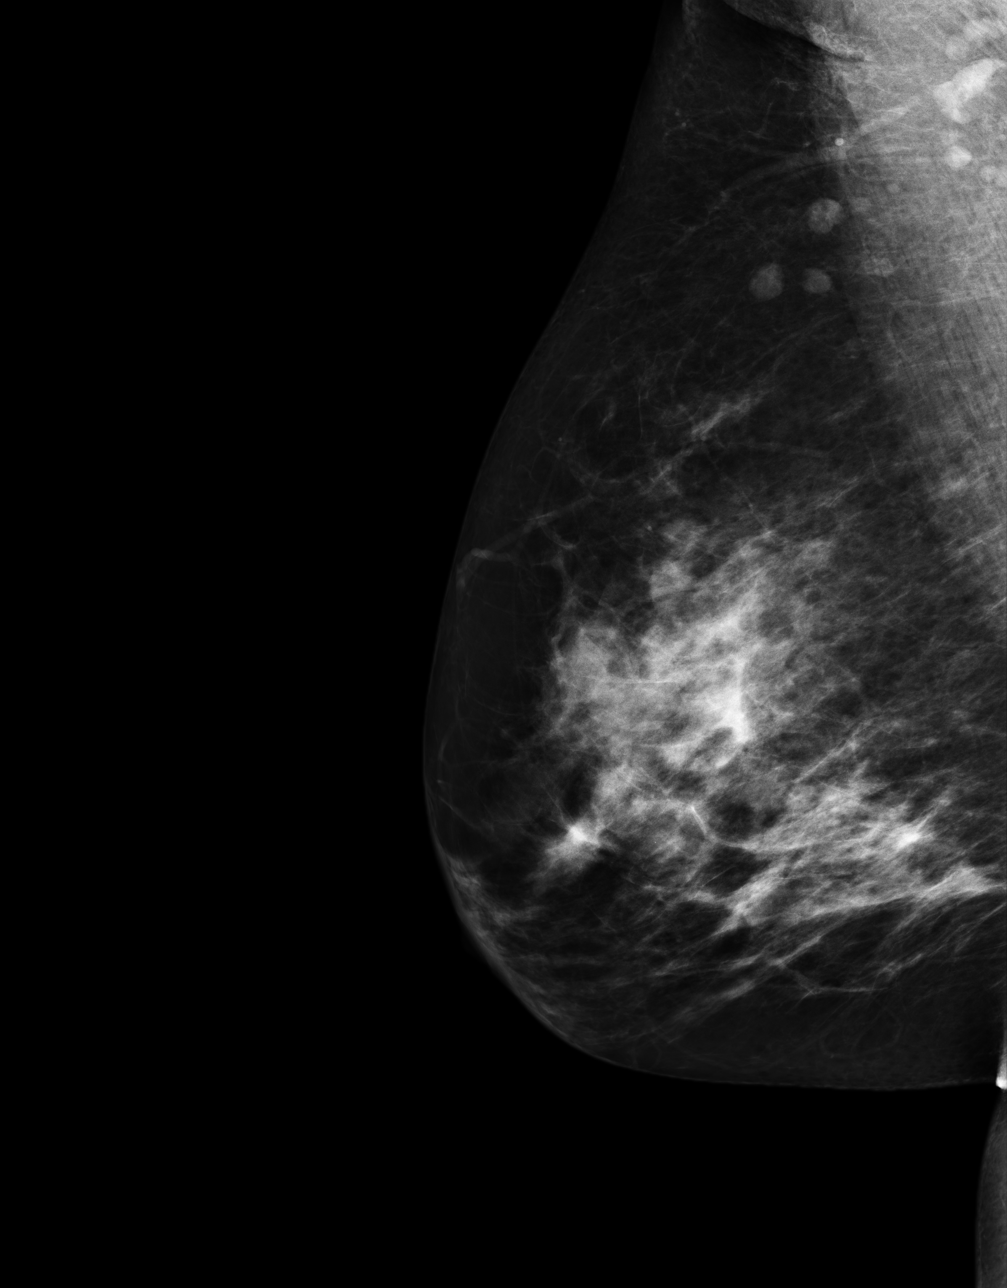
[im 4/4]
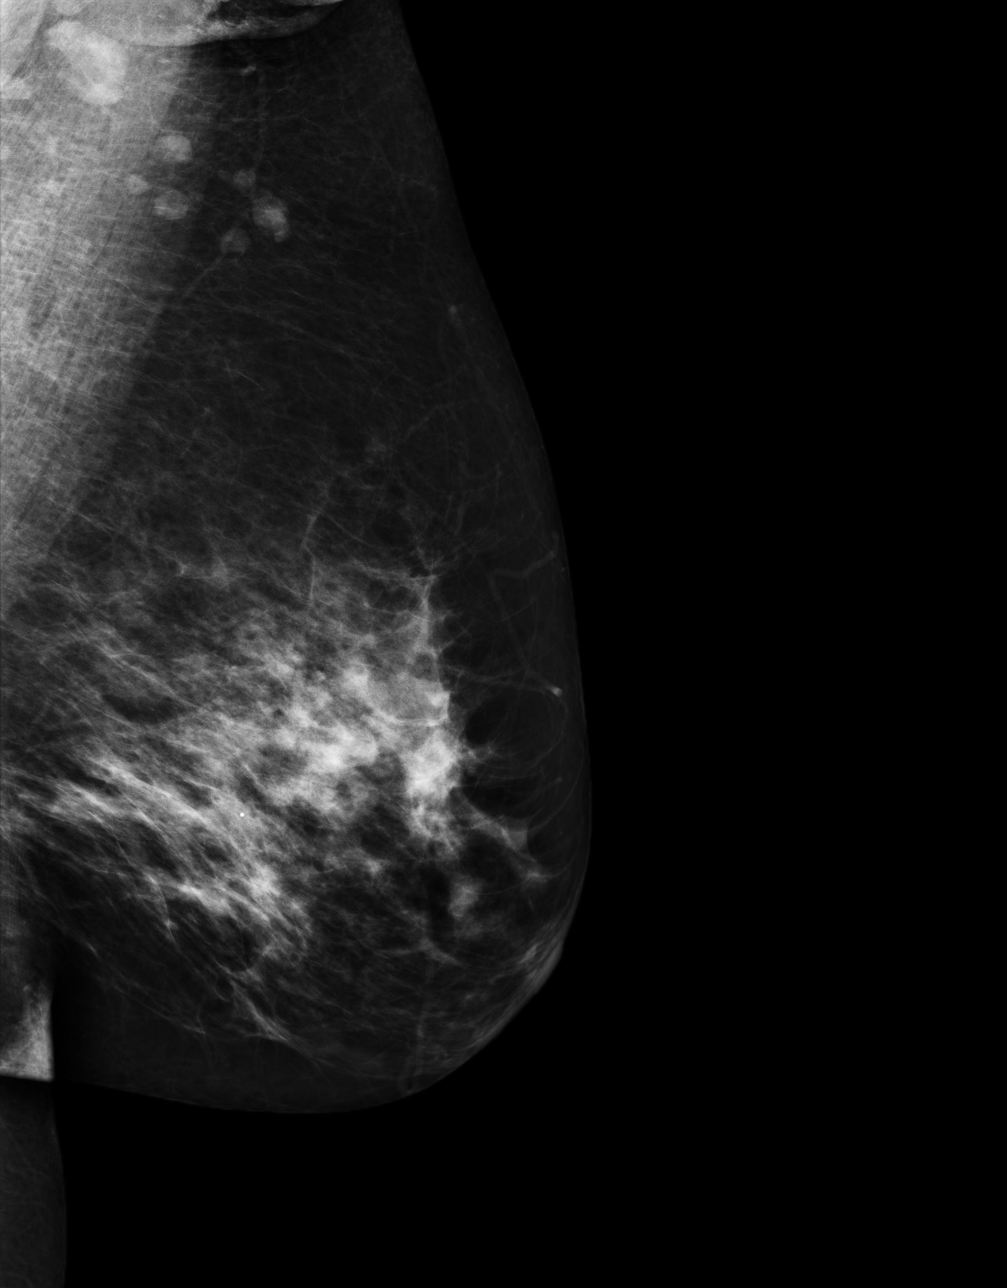

[4 of 4 positions shown; findings below may reference images not displayed]

ACR Breast Density Category c: The breast tissue is heterogeneously
dense, which may obscure small masses.
FINDINGS: There are no findings suspicious for malignancy. Images were
processed with CAD.
IMPRESSION: No mammographic evidence of malignancy. A result letter of this
screening mammogram will be mailed directly to the patient.

RECOMMENDATION:
Screening mammogram in one year. (Code:[0J])

BI-RADS CATEGORY  1: Negative.

## 2013-10-16 DIAGNOSIS — E785 Hyperlipidemia, unspecified: Secondary | ICD-10-CM | POA: Insufficient documentation

## 2013-10-16 DIAGNOSIS — R350 Frequency of micturition: Secondary | ICD-10-CM | POA: Insufficient documentation

## 2013-10-16 DIAGNOSIS — Z79899 Other long term (current) drug therapy: Secondary | ICD-10-CM | POA: Insufficient documentation

## 2013-10-16 DIAGNOSIS — N841 Polyp of cervix uteri: Secondary | ICD-10-CM | POA: Insufficient documentation

## 2014-02-24 DIAGNOSIS — N939 Abnormal uterine and vaginal bleeding, unspecified: Secondary | ICD-10-CM | POA: Insufficient documentation

## 2014-04-15 DIAGNOSIS — M79672 Pain in left foot: Secondary | ICD-10-CM | POA: Insufficient documentation

## 2014-07-09 ENCOUNTER — Ambulatory Visit: Payer: Self-pay | Admitting: Family Medicine

## 2014-07-13 ENCOUNTER — Ambulatory Visit: Payer: Self-pay

## 2014-07-13 IMAGING — MG MM DIGITAL SCREENING BILAT W/ CAD
5 series · 5 of 5 positions shown · non-contrast
Comparison: Previous exam(s).

CLINICAL DATA: Screening.

EXAM:
DIGITAL SCREENING BILATERAL MAMMOGRAM WITH CAD

[R CC]
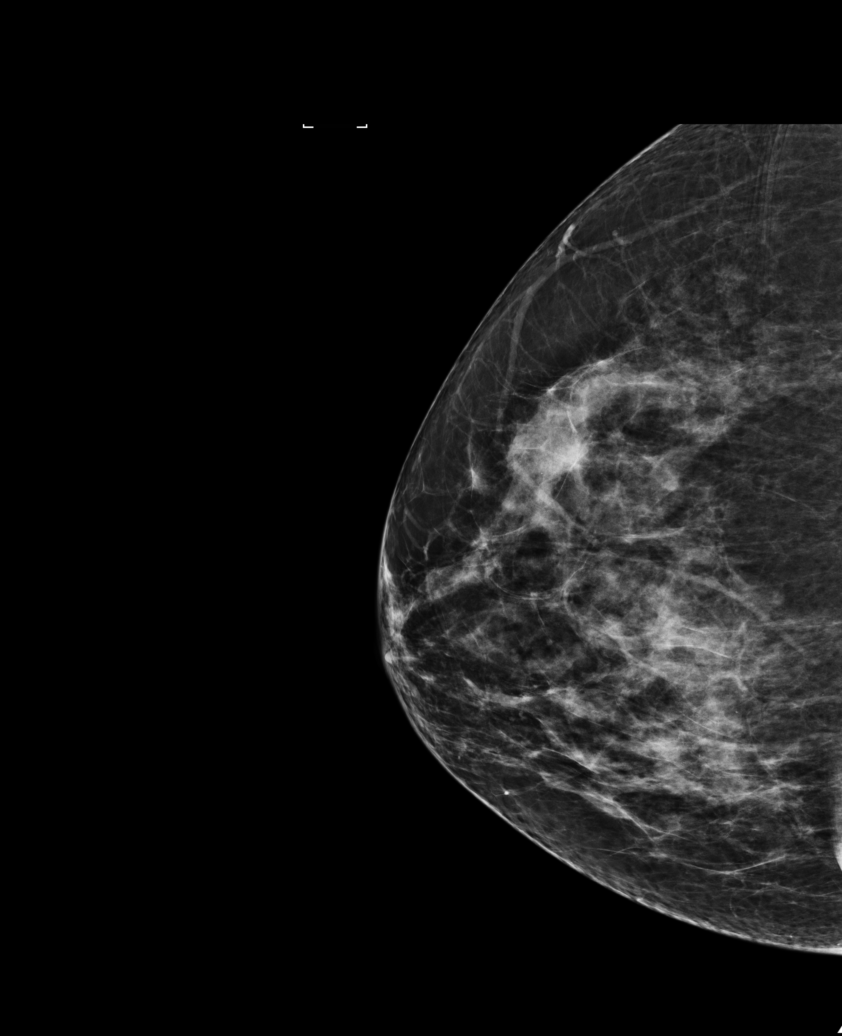

[R MLO (1 of 2)]
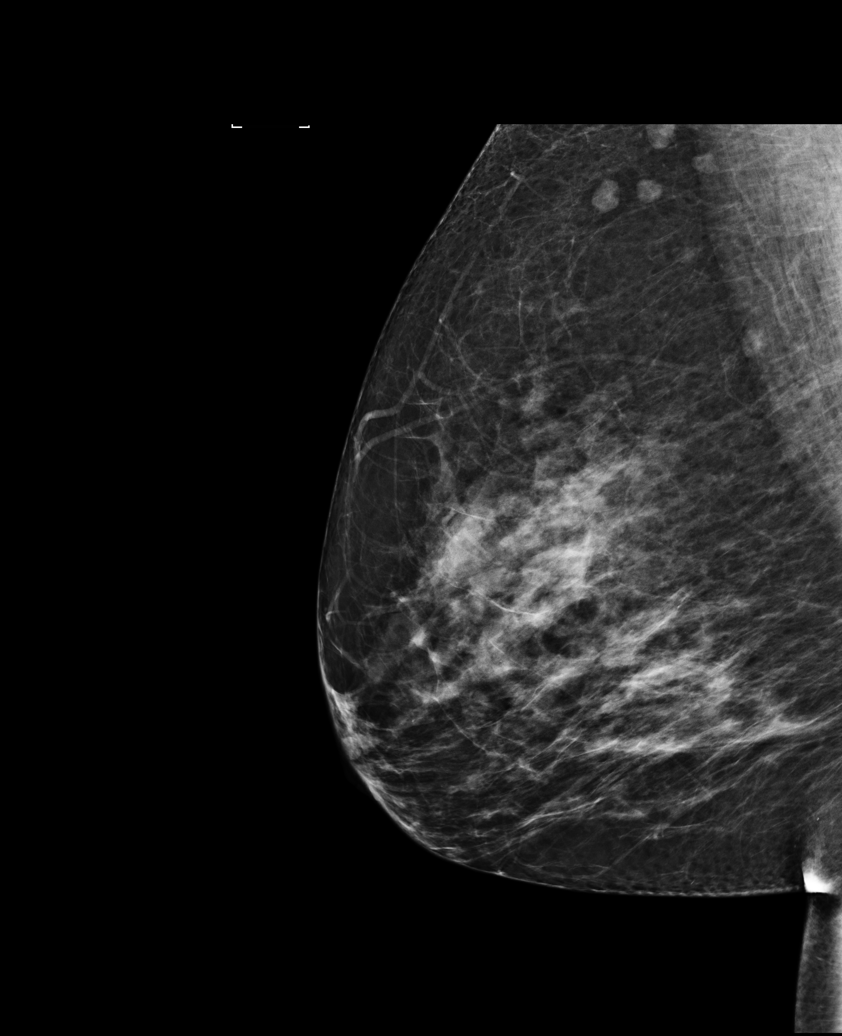

[L MLO]
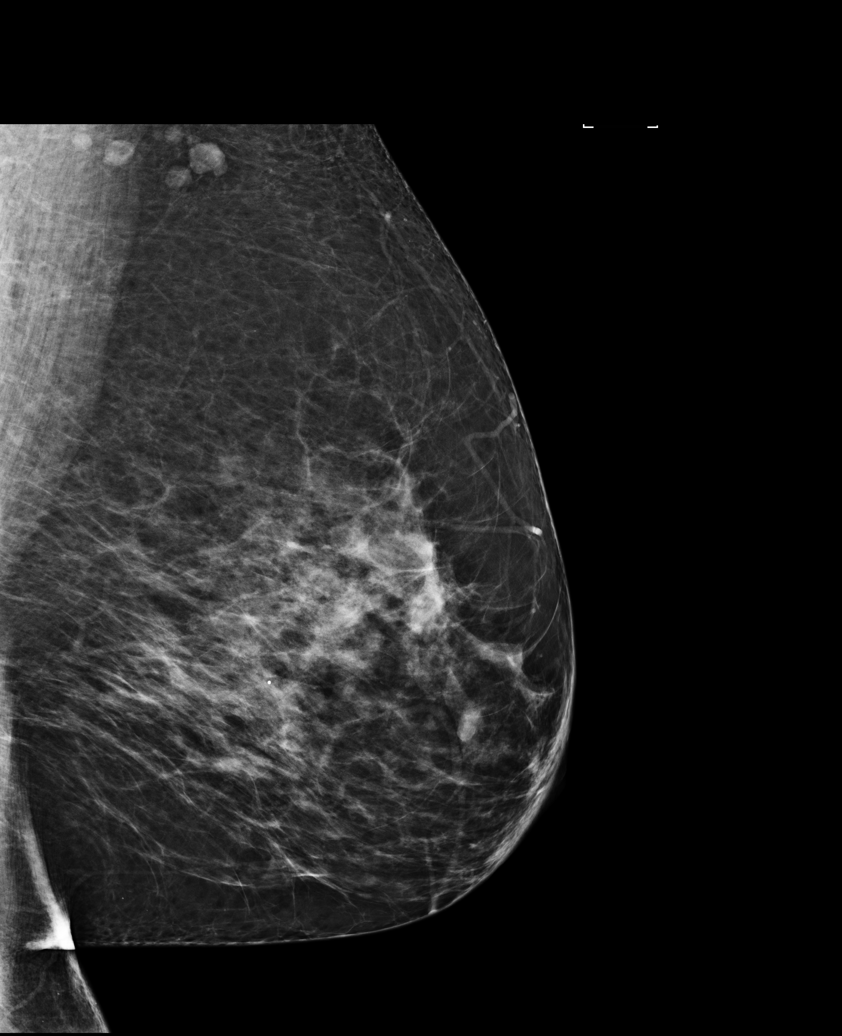

[L CC]
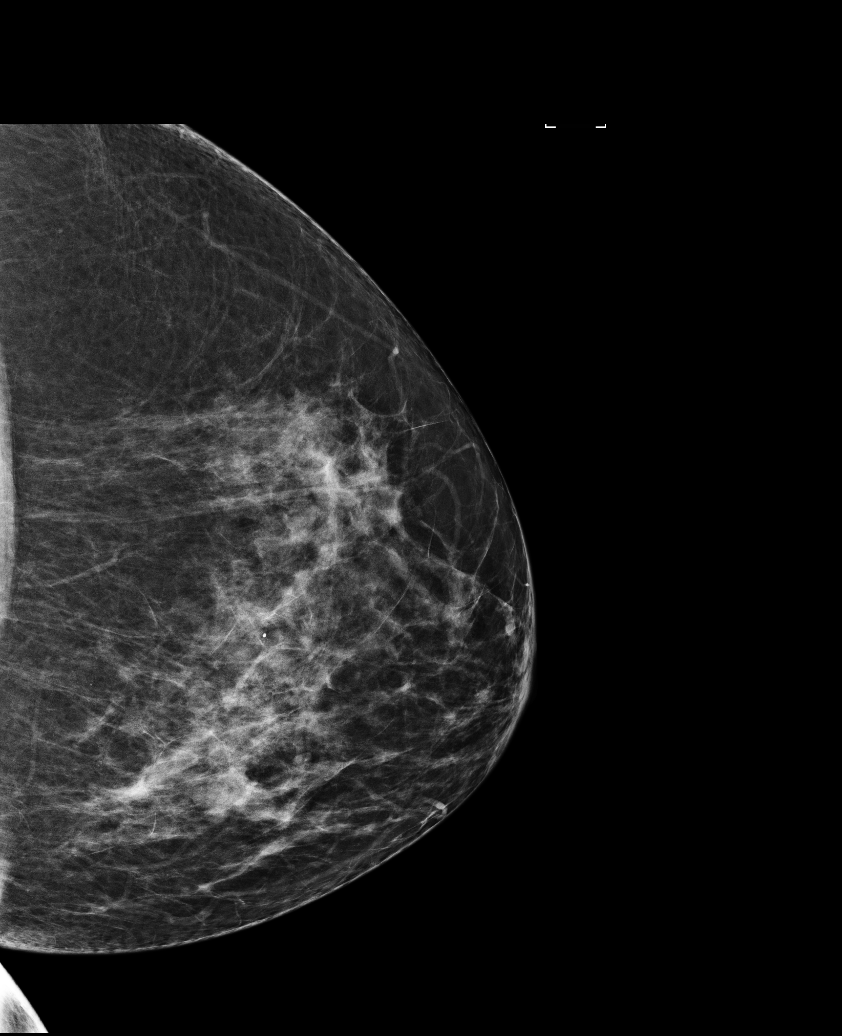

[R MLO (2 of 2)]
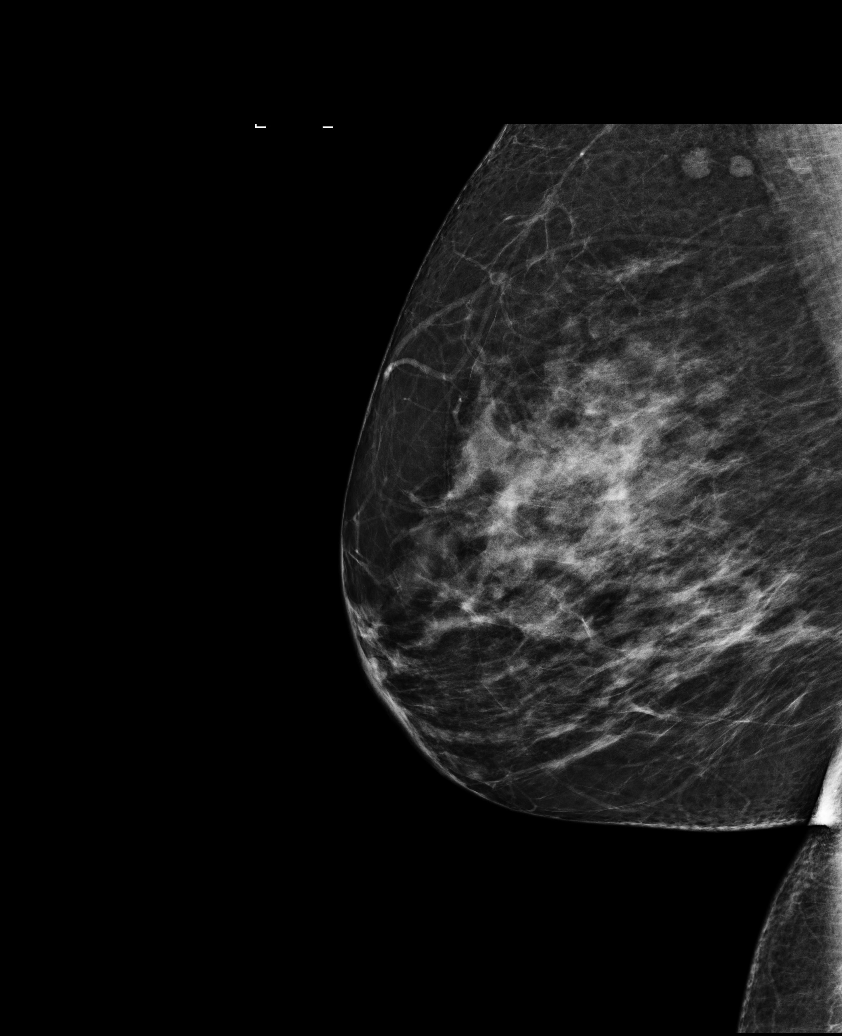

[5 of 5 positions shown; findings below may reference images not displayed]

ACR Breast Density Category c: The breast tissue is heterogeneously
dense, which may obscure small masses.
FINDINGS: There are no findings suspicious for malignancy. Images were
processed with CAD.
IMPRESSION: No mammographic evidence of malignancy. A result letter of this
screening mammogram will be mailed directly to the patient.

RECOMMENDATION:
Screening mammogram in one year. (Code:[0J])

BI-RADS CATEGORY  1: Negative.

## 2014-10-29 DIAGNOSIS — K635 Polyp of colon: Secondary | ICD-10-CM | POA: Insufficient documentation

## 2015-05-31 ENCOUNTER — Other Ambulatory Visit: Payer: Self-pay | Admitting: Internal Medicine

## 2015-07-30 ENCOUNTER — Other Ambulatory Visit: Payer: Self-pay | Admitting: Internal Medicine

## 2015-08-02 ENCOUNTER — Other Ambulatory Visit: Payer: Self-pay | Admitting: Internal Medicine

## 2015-08-02 DIAGNOSIS — Z1239 Encounter for other screening for malignant neoplasm of breast: Secondary | ICD-10-CM

## 2015-08-23 ENCOUNTER — Ambulatory Visit
Admission: RE | Admit: 2015-08-23 | Discharge: 2015-08-23 | Disposition: A | Discharge status: Home / Self Care | Payer: Medicaid Other | Source: Ambulatory Visit | Attending: Internal Medicine | Admitting: Internal Medicine

## 2015-08-23 DIAGNOSIS — Z1231 Encounter for screening mammogram for malignant neoplasm of breast: Secondary | ICD-10-CM | POA: Insufficient documentation

## 2015-08-23 DIAGNOSIS — Z1239 Encounter for other screening for malignant neoplasm of breast: Secondary | ICD-10-CM

## 2015-08-23 IMAGING — MG MM DIGITAL SCREENING BILAT W/ CAD
5 series · 5 of 5 positions shown · non-contrast
Comparison: Previous exam(s).

CLINICAL DATA: Screening.

EXAM:
DIGITAL SCREENING BILATERAL MAMMOGRAM WITH CAD

[L CC]
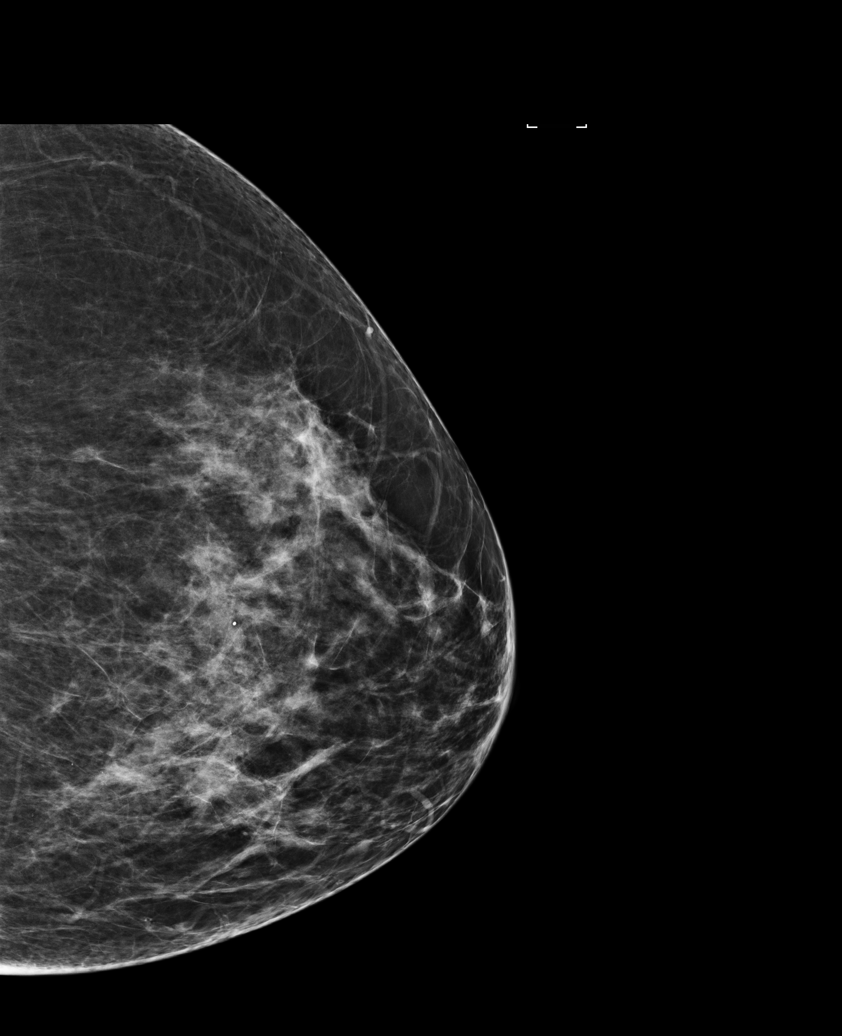

[R MLO (1 of 2)]
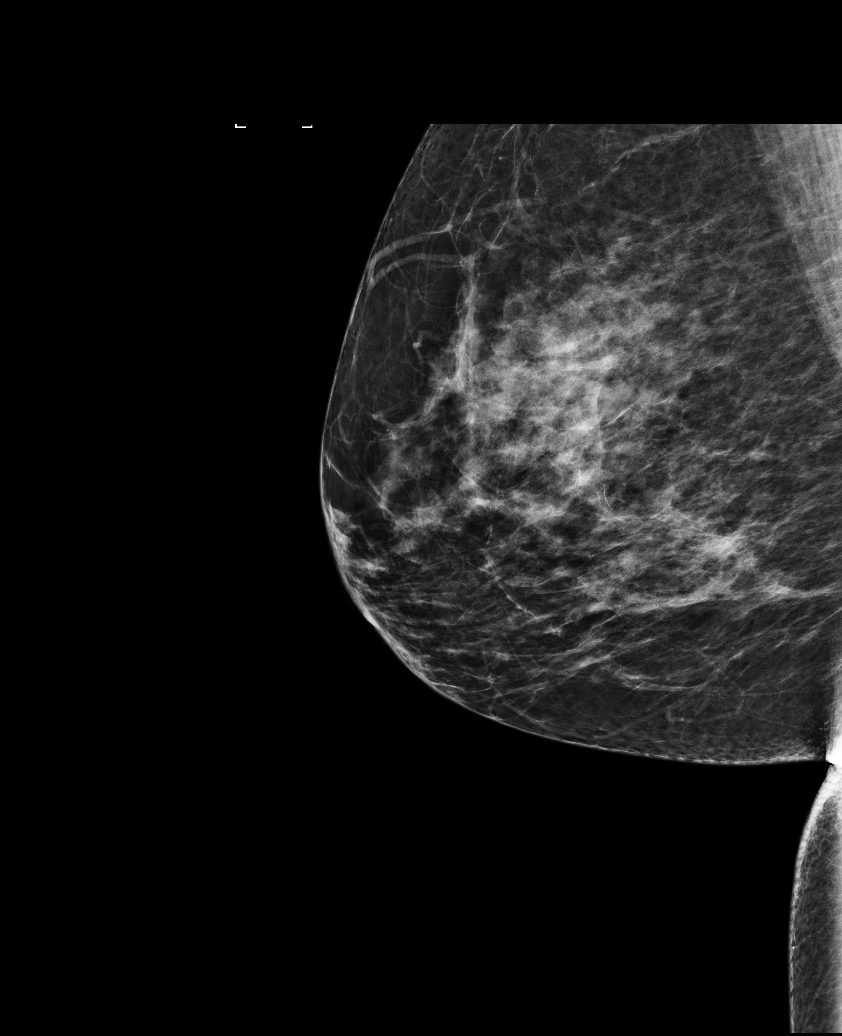

[L MLO]
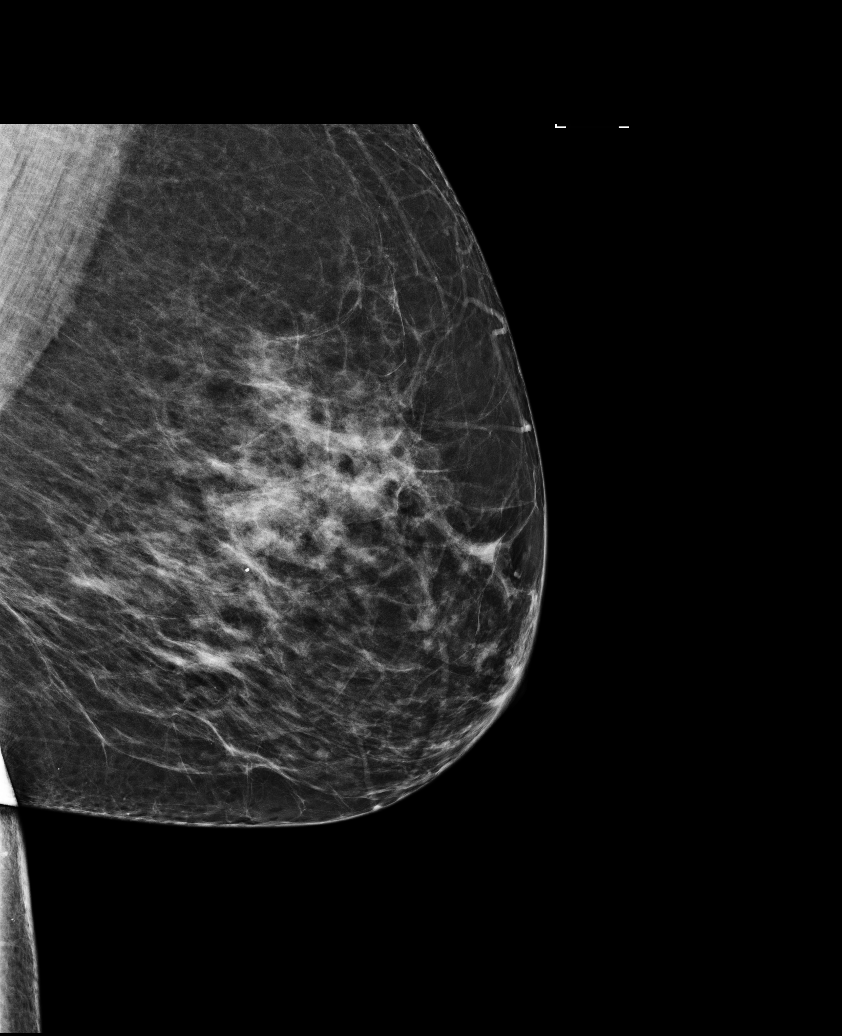

[R CC]
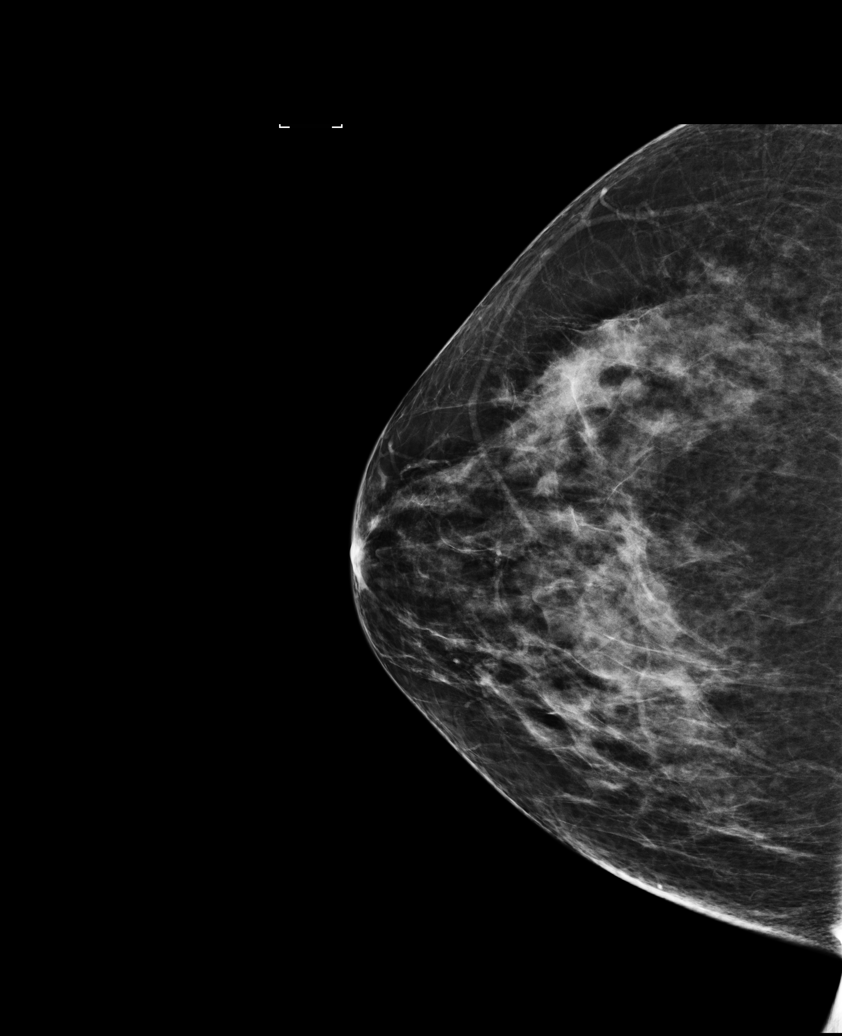

[R MLO (2 of 2)]
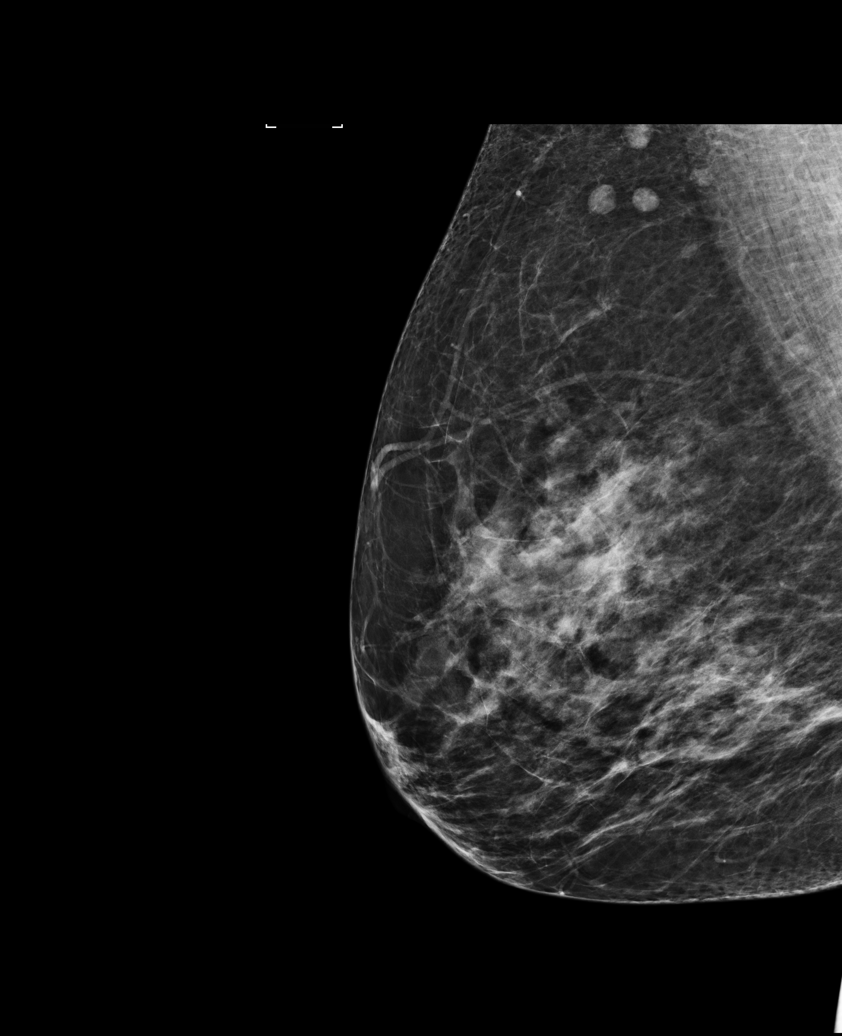

[5 of 5 positions shown; findings below may reference images not displayed]

ACR Breast Density Category c: The breast tissue is heterogeneously
dense, which may obscure small masses.
FINDINGS: There are no findings suspicious for malignancy. Images were
processed with CAD.
IMPRESSION: No mammographic evidence of malignancy. A result letter of this
screening mammogram will be mailed directly to the patient.

RECOMMENDATION:
Screening mammogram in one year. (Code:[0J])

BI-RADS CATEGORY  1: Negative.

## 2016-07-20 ENCOUNTER — Other Ambulatory Visit: Payer: Self-pay | Admitting: Internal Medicine

## 2016-07-20 DIAGNOSIS — Z1231 Encounter for screening mammogram for malignant neoplasm of breast: Secondary | ICD-10-CM | POA: Insufficient documentation

## 2016-07-21 ENCOUNTER — Other Ambulatory Visit: Payer: Self-pay | Admitting: Internal Medicine

## 2016-07-21 DIAGNOSIS — Z1231 Encounter for screening mammogram for malignant neoplasm of breast: Secondary | ICD-10-CM

## 2016-09-22 DIAGNOSIS — E2839 Other primary ovarian failure: Secondary | ICD-10-CM | POA: Insufficient documentation

## 2016-10-17 ENCOUNTER — Other Ambulatory Visit: Payer: Self-pay | Admitting: Internal Medicine

## 2016-10-17 ENCOUNTER — Ambulatory Visit
Admission: RE | Admit: 2016-10-17 | Discharge: 2016-10-17 | Disposition: A | Discharge status: Home / Self Care | Payer: Medicaid Other | Source: Ambulatory Visit | Attending: Internal Medicine | Admitting: Internal Medicine

## 2016-10-17 DIAGNOSIS — Z1231 Encounter for screening mammogram for malignant neoplasm of breast: Secondary | ICD-10-CM | POA: Insufficient documentation

## 2016-10-17 DIAGNOSIS — E2839 Other primary ovarian failure: Secondary | ICD-10-CM

## 2016-10-17 IMAGING — MG MM DIGITAL SCREENING BILAT W/ CAD
4 series · 4 of 4 positions shown · non-contrast
Comparison: Previous exam(s).

CLINICAL DATA: Screening.

EXAM:
DIGITAL SCREENING BILATERAL MAMMOGRAM WITH CAD

[L CC]
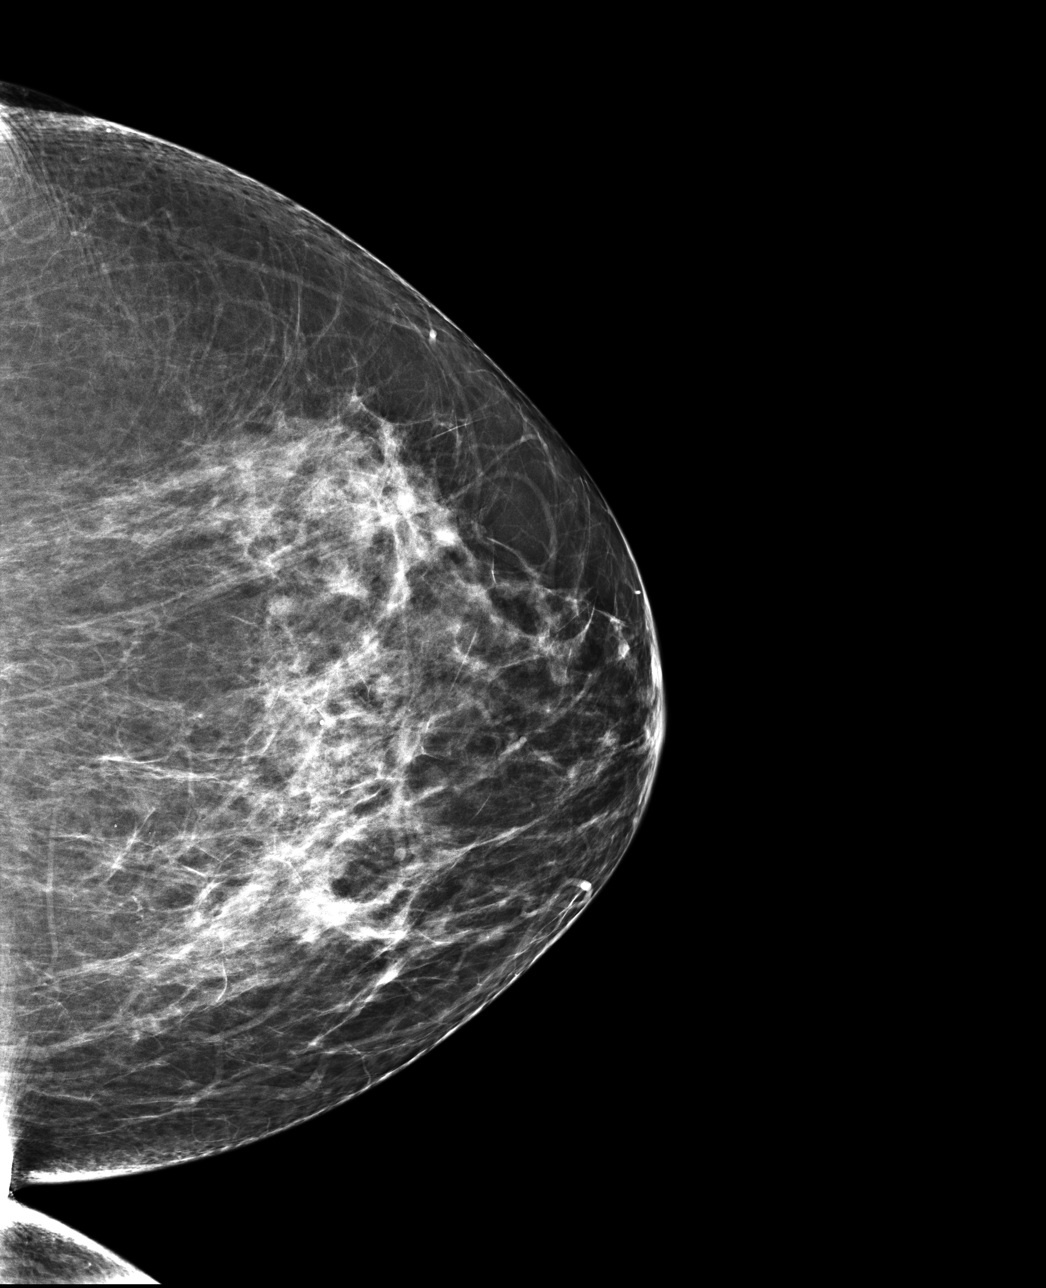

[R CC]
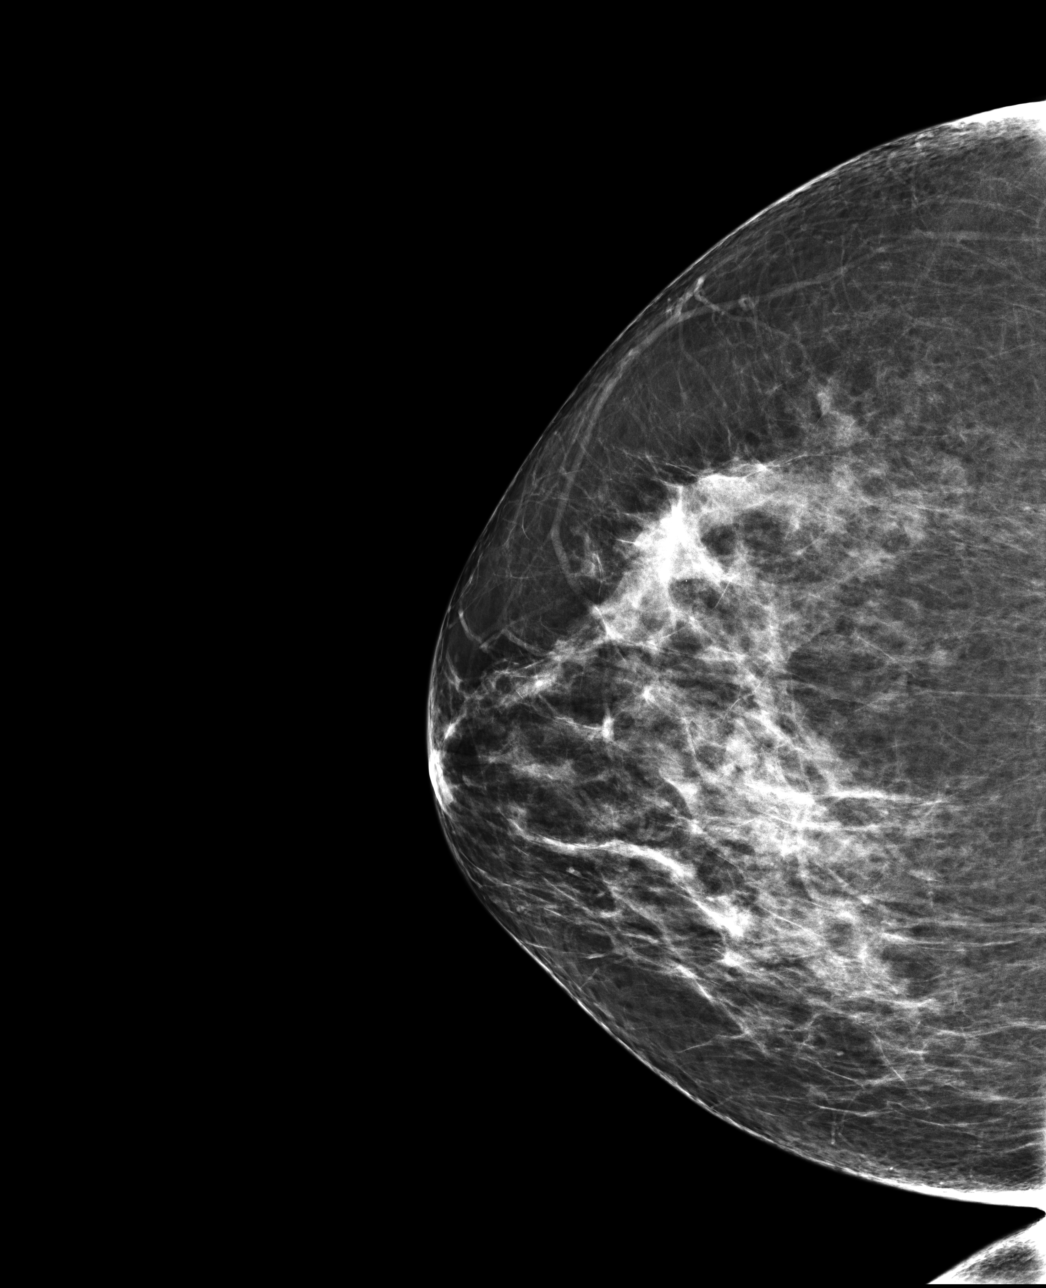

[R MLO]
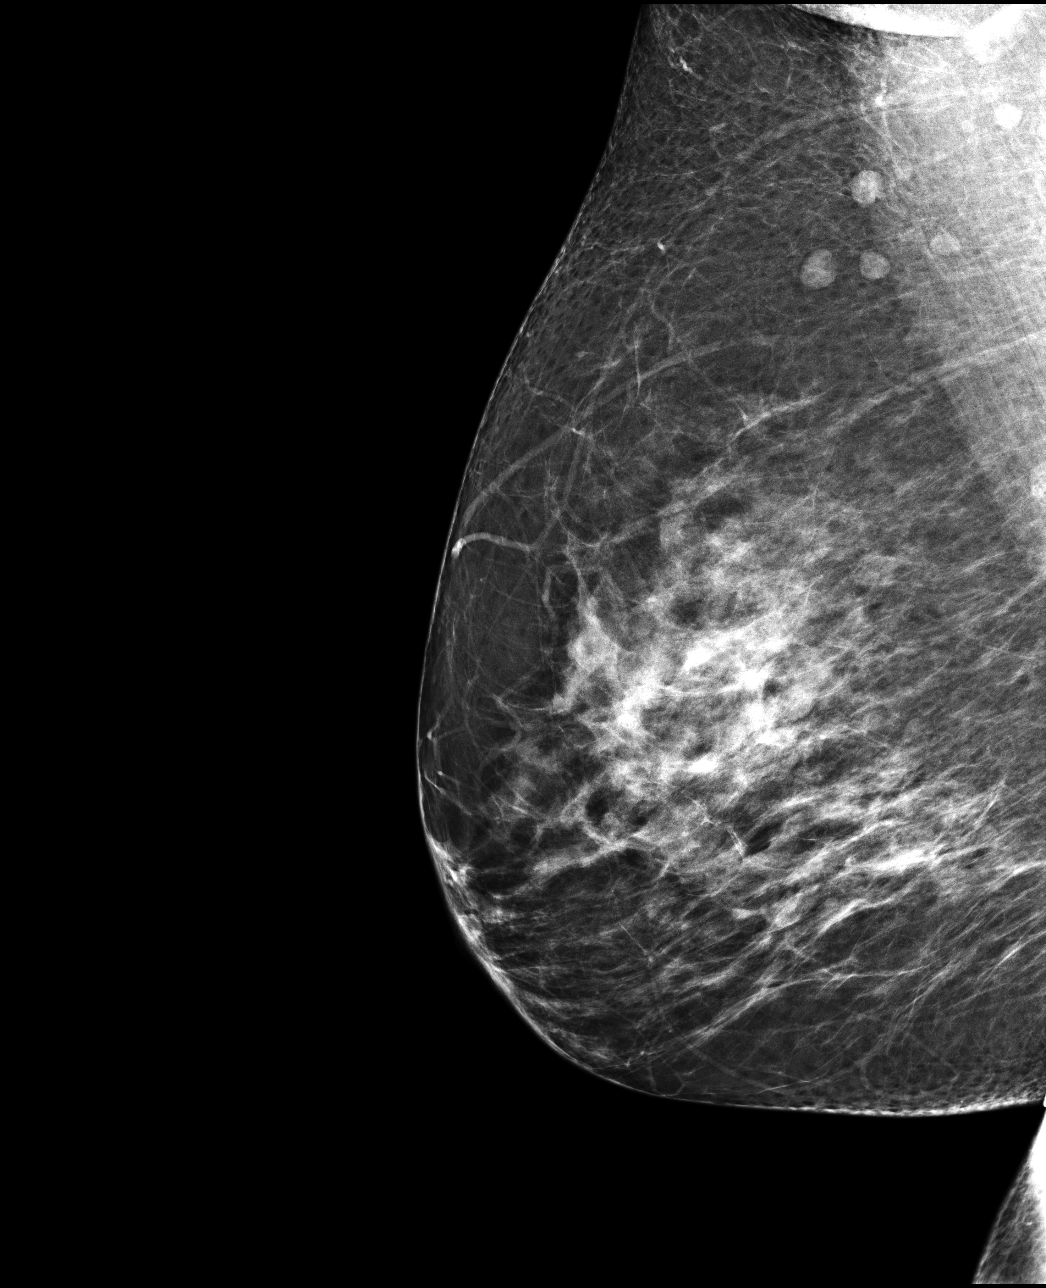

[L MLO]
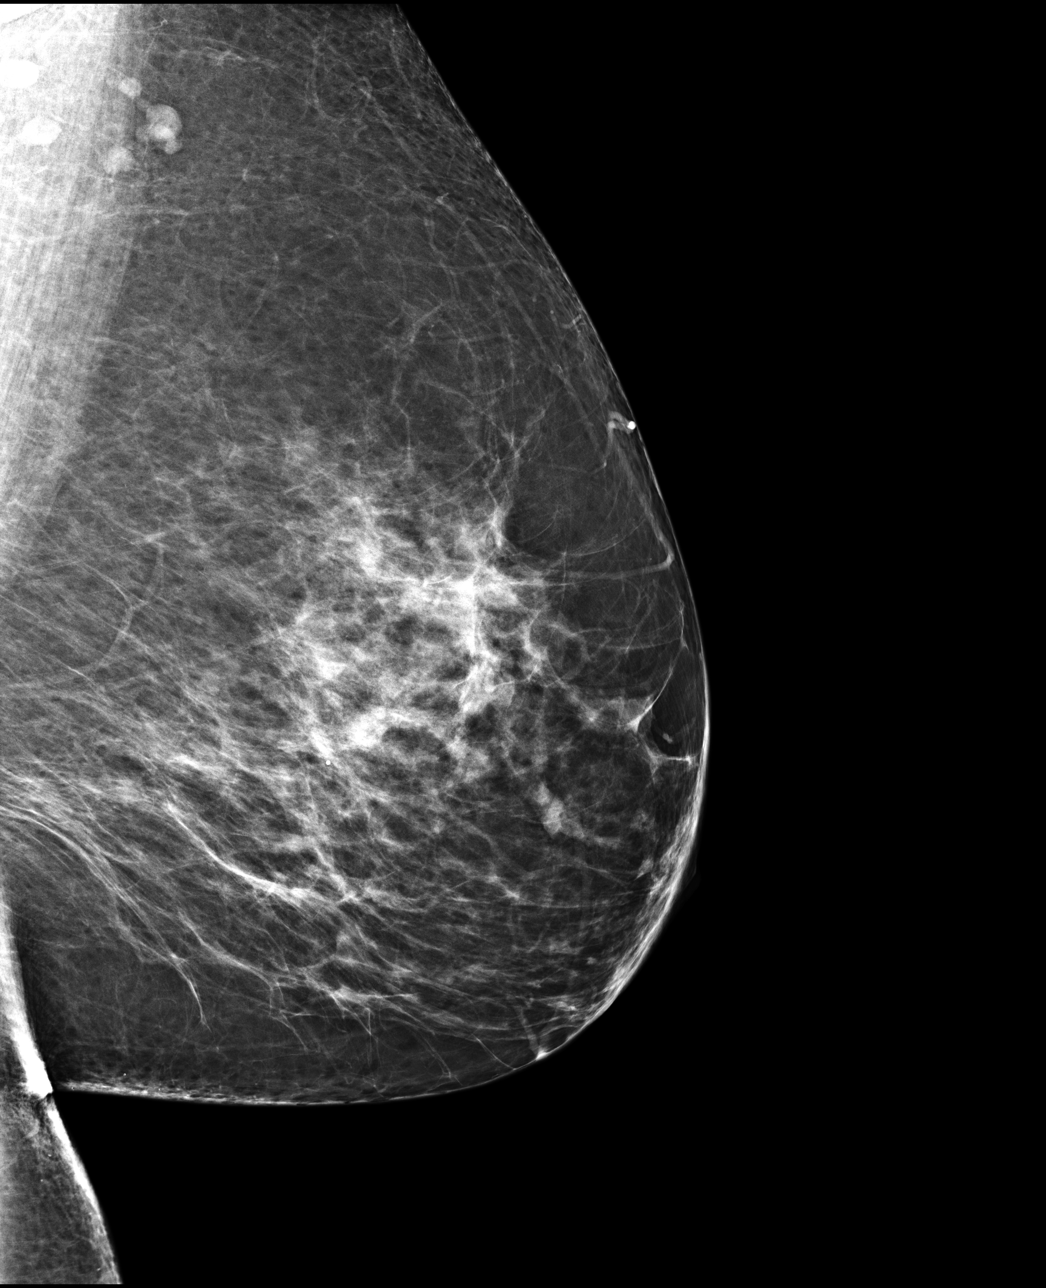

[4 of 4 positions shown; findings below may reference images not displayed]

ACR Breast Density Category c: The breast tissue is heterogeneously
dense, which may obscure small masses.
FINDINGS: There are no findings suspicious for malignancy. Images were
processed with CAD.
IMPRESSION: No mammographic evidence of malignancy. A result letter of this
screening mammogram will be mailed directly to the patient.

RECOMMENDATION:
Screening mammogram in one year. (Code:[0J])

BI-RADS CATEGORY  1: Negative.

## 2016-12-13 ENCOUNTER — Ambulatory Visit
Admission: RE | Admit: 2016-12-13 | Discharge: 2016-12-13 | Disposition: A | Discharge status: Home / Self Care | Payer: Medicaid Other | Source: Ambulatory Visit | Attending: Internal Medicine | Admitting: Internal Medicine

## 2016-12-13 DIAGNOSIS — M8588 Other specified disorders of bone density and structure, other site: Secondary | ICD-10-CM | POA: Diagnosis not present

## 2016-12-13 DIAGNOSIS — E2839 Other primary ovarian failure: Secondary | ICD-10-CM | POA: Diagnosis not present

## 2017-06-11 ENCOUNTER — Emergency Department
Admission: EM | Admit: 2017-06-11 | Discharge: 2017-06-11 | Disposition: A | Discharge status: Home / Self Care | Payer: Medicaid Other | Attending: Emergency Medicine | Admitting: Emergency Medicine

## 2017-06-11 ENCOUNTER — Encounter: Payer: Self-pay | Admitting: Medical Oncology

## 2017-06-11 DIAGNOSIS — L0291 Cutaneous abscess, unspecified: Secondary | ICD-10-CM | POA: Diagnosis not present

## 2017-06-11 DIAGNOSIS — Z79899 Other long term (current) drug therapy: Secondary | ICD-10-CM | POA: Insufficient documentation

## 2017-06-11 DIAGNOSIS — Z7982 Long term (current) use of aspirin: Secondary | ICD-10-CM | POA: Diagnosis not present

## 2017-06-11 DIAGNOSIS — E119 Type 2 diabetes mellitus without complications: Secondary | ICD-10-CM | POA: Insufficient documentation

## 2017-06-11 HISTORY — DX: Type 2 diabetes mellitus without complications: E11.9

## 2017-06-11 HISTORY — DX: Disorder of thyroid, unspecified: E07.9

## 2017-06-11 MED ORDER — IBUPROFEN 600 MG PO TABS: 600.0000 mg | ORAL_TABLET | Freq: Three times a day (TID) | ORAL | 0 refills | Status: DC | PRN

## 2017-06-11 MED ORDER — CLINDAMYCIN HCL 300 MG PO CAPS: 300.0000 mg | ORAL_CAPSULE | Freq: Four times a day (QID) | ORAL | 0 refills | Status: AC

## 2017-06-11 MED ORDER — OXYCODONE-ACETAMINOPHEN 7.5-325 MG PO TABS: 1.0000 | ORAL_TABLET | Freq: Four times a day (QID) | ORAL | 0 refills | Status: DC | PRN

## 2017-06-11 NOTE — ED Triage Notes (Signed)
Pt reports abscess to lower abdomen

## 2017-06-11 NOTE — ED Notes (Addendum)
See triage note.states she has swollen area to lower abd /groin area  On arrival large reddened area noted to right lower pelvic area  States she had area lanced about 1-2 weeks ago   But area returned

## 2017-06-11 NOTE — Discharge Instructions (Signed)
Follow discharge care instructions.  If no improvement in 1 week contact the surgical department to schedule appointment for definitive evaluation and treatment.

## 2017-06-11 NOTE — ED Provider Notes (Signed)
[facility] Emergency Department Provider Note   ____________________________________________   First MD Initiated Contact with Patient 06/11/17 1112     (approximate)  I have reviewed the triage vital signs and the nursing notes.   HISTORY  Chief Complaint Abscess    HPI Andrea [name] is a 57 y.o. female patient has abscess to the right groin area.  Patient states she was seen by urgent care clinic approximately 2 weeks ago and the area was incised and drained and placed placed on doxycycline.  Patient state abscess has returned the same location.  Patient rates the pain as a 10/10.  Patient denies drainage or fever.  Past Medical History:  Diagnosis Date  . Diabetes mellitus without complication ([place])   . Thyroid disease     There are no active problems to display for this patient.   History reviewed. No pertinent surgical history.  Prior to Admission medications   Medication Sig Start Date End Date Taking? Authorizing Provider  aspirin 81 MG chewable tablet Chew 81 mg by mouth daily.   Yes [provider]  insulin aspart protamine- aspart (NOVOLOG MIX 70/30) (70-30) 100 UNIT/ML injection Inject into the skin.   Yes [provider]  lisinopril (PRINIVIL,ZESTRIL) 20 MG tablet Take 20 mg by mouth daily.   Yes [provider]  pravastatin (PRAVACHOL) 40 MG tablet Take 40 mg by mouth daily.   Yes [provider]  clindamycin (CLEOCIN) 300 MG capsule Take 1 capsule (300 mg total) by mouth 4 (four) times daily for 10 days. 06/11/17 06/21/17  Sable Feil, PA-C  ibuprofen (ADVIL,MOTRIN) 600 MG tablet Take 1 tablet (600 mg total) by mouth every 8 (eight) hours as needed. 06/11/17   Sable Feil, PA-C  oxyCODONE-acetaminophen (PERCOCET) 7.5-325 MG tablet Take 1 tablet by mouth every 6 (six) hours as needed for severe pain. 06/11/17   Sable Feil, PA-C    Allergies Patient has no known allergies.  Family History    Problem Relation Age of Onset  . Breast cancer Neg Hx     Social History Social History   Tobacco Use  . Smoking status: Never Smoker  . Smokeless tobacco: Never Used  Substance Use Topics  . Alcohol use: No    Frequency: Never  . Drug use: Not on file    Review of Systems Constitutional: No fever/chills Eyes: No visual changes. ENT: No sore throat. Cardiovascular: Denies chest pain. Respiratory: Denies shortness of breath. Gastrointestinal: No abdominal pain.  No nausea, no vomiting.  No diarrhea.  No constipation. Genitourinary: Negative for dysuria. Musculoskeletal: Negative for back pain. Skin: Nodule lesion right groin area Neurological: Negative for headaches, focal weakness or numbness. Endocrine:Diabetes and hypothyroidism. ____________________________________________   PHYSICAL EXAM:  VITAL SIGNS: ED Triage Vitals  Enc Vitals Group     BP 06/11/17 1026 134/70     Pulse Rate 06/11/17 1026 100     Resp 06/11/17 1026 16     Temp 06/11/17 1026 98.6 F (37 C)     Temp Source 06/11/17 1026 Oral     SpO2 06/11/17 1026 95 %     Weight 06/11/17 1025 191 lb (86.6 kg)     Height 06/11/17 1025 5\' 1"  (1.549 m)     Head Circumference --      Peak Flow --      Pain Score 06/11/17 1025 10     Pain Loc --      Pain Edu? --  Excl. in [place]? --     Constitutional: Alert and oriented. Well appearing and in no acute distress.  Anxious Cardiovascular: Normal rate, regular rhythm. Grossly normal heart sounds.  Good peripheral circulation. Respiratory: Normal respiratory effort.  No retractions. Lungs CTAB. Neurologic:  Normal speech and language. No gross focal neurologic deficits are appreciated. No gait instability. Skin: Chaperone exam revealed nodular lesion space approximately 2.5 cm.  Psychiatric: Mood and affect are normal. Speech and behavior are normal.  ____________________________________________   LABS (all labs ordered are listed, but only abnormal  results are displayed)  Labs Reviewed - No data to display ____________________________________________  EKG   ____________________________________________  RADIOLOGY  ED MD interpretation:    Official radiology report(s): No results found.  ____________________________________________   PROCEDURES  Procedure(s) performed: None  Procedures  Critical Care performed: No  ____________________________________________   INITIAL IMPRESSION / ASSESSMENT AND PLAN / ED COURSE  As part of my medical decision making, I reviewed the following data within the electronic MEDICAL RECORD NUMBER    Abscess right groin area.  Discussed rationale for incision and drainage at this time.  Patient refused procedure.  Patient states she will try antibiotics and pain medication.  Advised patient if no improvement or worsening of complaint to contact the surgical department for definitive evaluation and treatment.  Patient will be discharged prescription for clindamycin, Percocet, and ibuprofen.      ____________________________________________   FINAL CLINICAL IMPRESSION(S) / ED DIAGNOSES  Final diagnoses:  Abscess     ED Discharge Orders        Ordered    clindamycin (CLEOCIN) 300 MG capsule  4 times daily     06/11/17 1117    oxyCODONE-acetaminophen (PERCOCET) 7.5-325 MG tablet  Every 6 hours PRN     06/11/17 1117    ibuprofen (ADVIL,MOTRIN) 600 MG tablet  Every 8 hours PRN     06/11/17 1117       Note:  This document was prepared using Dragon voice recognition software and may include unintentional dictation errors.    Sable Feil, PA-C 06/11/17 1124    Lavonia Drafts, MD 06/11/17 1316

## 2017-06-12 ENCOUNTER — Telehealth: Payer: Self-pay | Admitting: General Practice

## 2017-06-12 NOTE — Telephone Encounter (Signed)
-----   Message from Mickie Kay sent at 06/12/2017 11:58 AM EST ----- Regarding: ED follow up ED follow up-New patient--Abscess of right groin--being treated with antibiotics at this time. Please ask patient if she would like to see a surgeon to follow up on the status of the abscess or another possible I&D. ---New patient. Any Surgeon.

## 2017-06-12 NOTE — Telephone Encounter (Signed)
Patient's coming in next week to see Dr. Adonis Huguenin

## 2017-06-12 NOTE — Telephone Encounter (Signed)
Left a message for the patient to call the office. °

## 2017-06-13 ENCOUNTER — Emergency Department
Admission: EM | Admit: 2017-06-13 | Discharge: 2017-06-13 | Disposition: A | Discharge status: Home / Self Care | Payer: Medicaid Other | Source: Home / Self Care | Attending: Emergency Medicine | Admitting: Emergency Medicine

## 2017-06-13 ENCOUNTER — Encounter: Payer: Self-pay | Admitting: Emergency Medicine

## 2017-06-13 DIAGNOSIS — Z794 Long term (current) use of insulin: Secondary | ICD-10-CM | POA: Insufficient documentation

## 2017-06-13 DIAGNOSIS — E119 Type 2 diabetes mellitus without complications: Secondary | ICD-10-CM | POA: Insufficient documentation

## 2017-06-13 DIAGNOSIS — L0291 Cutaneous abscess, unspecified: Secondary | ICD-10-CM

## 2017-06-13 DIAGNOSIS — Z79899 Other long term (current) drug therapy: Secondary | ICD-10-CM

## 2017-06-13 DIAGNOSIS — L02214 Cutaneous abscess of groin: Secondary | ICD-10-CM | POA: Insufficient documentation

## 2017-06-13 DIAGNOSIS — Z7982 Long term (current) use of aspirin: Secondary | ICD-10-CM

## 2017-06-13 NOTE — ED Provider Notes (Signed)
[facility] Emergency Department Provider Note  ____________________________________________  Time seen: Approximately 4:47 PM  I have reviewed the triage vital signs and the nursing notes.   HISTORY  Chief Complaint Abscess    HPI Andrea [name] is a 57 y.o. female that presents to the emergency department for evaluation of abscess to right groin for 3 days.  Patient was seen in this emergency department 2 days ago and was given clindamycin.  She started taking the clindamycin yesterday.  Abscess burst today and started draining.  She states that the drainage smells foul.  She called the hospital who recommended that she come to the emergency department to be evaluated. She has an appointment tomorrow with general surgery.  No fever, chills, nausea, vomiting, abdominal pain.   Past Medical History:  Diagnosis Date  . Diabetes mellitus without complication ([place])   . Thyroid disease     There are no active problems to display for this patient.   History reviewed. No pertinent surgical history.  Prior to Admission medications   Medication Sig Start Date End Date Taking? Authorizing Provider  aspirin 81 MG chewable tablet Chew 81 mg by mouth daily.    [provider]  clindamycin (CLEOCIN) 300 MG capsule Take 1 capsule (300 mg total) by mouth 4 (four) times daily for 10 days. 06/11/17 06/21/17  Sable Feil, PA-C  ibuprofen (ADVIL,MOTRIN) 600 MG tablet Take 1 tablet (600 mg total) by mouth every 8 (eight) hours as needed. 06/11/17   Sable Feil, PA-C  insulin aspart protamine- aspart (NOVOLOG MIX 70/30) (70-30) 100 UNIT/ML injection Inject into the skin.    [provider]  lisinopril (PRINIVIL,ZESTRIL) 20 MG tablet Take 20 mg by mouth daily.    [provider]  oxyCODONE-acetaminophen (PERCOCET) 7.5-325 MG tablet Take 1 tablet by mouth every 6 (six) hours as needed for severe pain. 06/11/17   Sable Feil, PA-C  pravastatin  (PRAVACHOL) 40 MG tablet Take 40 mg by mouth daily.    [provider]    Allergies Patient has no known allergies.  Family History  Problem Relation Age of Onset  . Breast cancer Neg Hx     Social History Social History   Tobacco Use  . Smoking status: Never Smoker  . Smokeless tobacco: Never Used  Substance Use Topics  . Alcohol use: No    Frequency: Never  . Drug use: Not on file     Review of Systems  Constitutional: No fever/chills Cardiovascular: No chest pain. Respiratory: No SOB. Gastrointestinal: No abdominal pain.  No nausea, no vomiting.  Skin: Negative for lacerations, ecchymosis.   ____________________________________________   PHYSICAL EXAM:  VITAL SIGNS: ED Triage Vitals  Enc Vitals Group     BP 06/13/17 1621 (!) 178/85     Pulse Rate 06/13/17 1621 (!) 103     Resp 06/13/17 1621 18     Temp 06/13/17 1621 98.6 F (37 C)     Temp Source 06/13/17 1621 Oral     SpO2 06/13/17 1621 98 %     Weight 06/13/17 1622 191 lb (86.6 kg)     Height 06/13/17 1622 5\' 1"  (1.549 m)     Head Circumference --      Peak Flow --      Pain Score 06/13/17 1622 10     Pain Loc --      Pain Edu? --      Excl. in [place]? --  Constitutional: Alert and oriented. Well appearing and in no acute distress. Eyes: Conjunctivae are normal. PERRL. EOMI. Head: Atraumatic. ENT:      Ears:      Nose: No congestion/rhinnorhea.      Mouth/Throat: Mucous membranes are moist.  Neck: No stridor.   Cardiovascular: Normal rate, regular rhythm.  Good peripheral circulation. Respiratory: Normal respiratory effort without tachypnea or retractions. Lungs CTAB. Good air entry to the bases with no decreased or absent breath sounds. Musculoskeletal: Full range of motion to all extremities. No gross deformities appreciated. Neurologic:  Normal speech and language. No gross focal neurologic deficits are appreciated.  Skin:  Skin is warm, dry.  1/4 cm opening to right groin that is  actively draining yellow foul-smelling purulent drainage.   ____________________________________________   LABS (all labs ordered are listed, but only abnormal results are displayed)  Labs Reviewed - No data to display ____________________________________________  EKG   ____________________________________________  RADIOLOGY  No results found.  ____________________________________________    PROCEDURES  Procedure(s) performed:    Procedures    Medications - No data to display   ____________________________________________   INITIAL IMPRESSION / ASSESSMENT AND PLAN / ED COURSE  Pertinent labs & imaging results that were available during my care of the patient were reviewed by me and considered in my medical decision making (see chart for details).  Review of the [place] CSRS was performed in accordance of the [place] prior to dispensing any controlled drugs.   Patient's diagnosis is consistent with abscess.  Vital signs and exam are reassuring.  Abscess is actively draining in ED so no indication for futher I&D.  She has started taking clindamycin.  She has an appointment with general surgery tomorrow for evaluation.  Patient is to follow up with PCP as directed. Patient is given ED precautions to return to the ED for any worsening or new symptoms.     ____________________________________________  FINAL CLINICAL IMPRESSION(S) / ED DIAGNOSES  Final diagnoses:  Abscess      NEW MEDICATIONS STARTED DURING THIS VISIT:  ED Discharge Orders    None          This chart was dictated using voice recognition software/Dragon. Despite best efforts to proofread, errors can occur which can change the meaning. Any change was purely unintentional.    Laban Emperor, PA-C 06/13/17 1833    Schuyler Amor, MD 06/14/17 2223

## 2017-06-13 NOTE — ED Triage Notes (Signed)
PT was seen here on Monday for a abscess in lower abdomen. Pt states "it busted today," which made her call the nurse hotline who told her to come in to have it evaluated. Pt states she was given antibiotics and has been taking them as prescribed.

## 2017-06-14 ENCOUNTER — Encounter: Payer: Self-pay | Admitting: *Deleted

## 2017-06-14 ENCOUNTER — Other Ambulatory Visit: Payer: Self-pay

## 2017-06-14 ENCOUNTER — Ambulatory Visit (INDEPENDENT_AMBULATORY_CARE_PROVIDER_SITE_OTHER): Payer: Medicaid Other | Admitting: Surgery

## 2017-06-14 ENCOUNTER — Inpatient Hospital Stay
Admission: AD | Admit: 2017-06-14 | Discharge: 2017-06-16 | DRG: 603 | Disposition: A | Discharge status: Home / Self Care | Payer: Medicaid Other | Source: Ambulatory Visit | Attending: Surgery | Admitting: Surgery

## 2017-06-14 ENCOUNTER — Encounter: Payer: Self-pay | Admitting: Surgery

## 2017-06-14 VITALS — BP 149/90 | HR 105 | Temp 98.2°F | Ht 61.0 in | Wt 197.8 lb

## 2017-06-14 DIAGNOSIS — Z8249 Family history of ischemic heart disease and other diseases of the circulatory system: Secondary | ICD-10-CM | POA: Diagnosis not present

## 2017-06-14 DIAGNOSIS — E1065 Type 1 diabetes mellitus with hyperglycemia: Secondary | ICD-10-CM | POA: Diagnosis present

## 2017-06-14 DIAGNOSIS — L0390 Cellulitis, unspecified: Secondary | ICD-10-CM | POA: Diagnosis present

## 2017-06-14 DIAGNOSIS — Z794 Long term (current) use of insulin: Secondary | ICD-10-CM | POA: Diagnosis not present

## 2017-06-14 DIAGNOSIS — E039 Hypothyroidism, unspecified: Secondary | ICD-10-CM | POA: Insufficient documentation

## 2017-06-14 DIAGNOSIS — Z79899 Other long term (current) drug therapy: Secondary | ICD-10-CM | POA: Diagnosis not present

## 2017-06-14 DIAGNOSIS — E785 Hyperlipidemia, unspecified: Secondary | ICD-10-CM | POA: Diagnosis present

## 2017-06-14 DIAGNOSIS — Z91040 Latex allergy status: Secondary | ICD-10-CM | POA: Diagnosis not present

## 2017-06-14 DIAGNOSIS — E1159 Type 2 diabetes mellitus with other circulatory complications: Secondary | ICD-10-CM | POA: Insufficient documentation

## 2017-06-14 DIAGNOSIS — I10 Essential (primary) hypertension: Secondary | ICD-10-CM

## 2017-06-14 DIAGNOSIS — N921 Excessive and frequent menstruation with irregular cycle: Secondary | ICD-10-CM | POA: Insufficient documentation

## 2017-06-14 DIAGNOSIS — Z79890 Hormone replacement therapy (postmenopausal): Secondary | ICD-10-CM

## 2017-06-14 DIAGNOSIS — B009 Herpesviral infection, unspecified: Secondary | ICD-10-CM | POA: Insufficient documentation

## 2017-06-14 DIAGNOSIS — D219 Benign neoplasm of connective and other soft tissue, unspecified: Secondary | ICD-10-CM | POA: Insufficient documentation

## 2017-06-14 DIAGNOSIS — Z7982 Long term (current) use of aspirin: Secondary | ICD-10-CM | POA: Diagnosis not present

## 2017-06-14 DIAGNOSIS — L02214 Cutaneous abscess of groin: Secondary | ICD-10-CM

## 2017-06-14 DIAGNOSIS — M549 Dorsalgia, unspecified: Secondary | ICD-10-CM | POA: Insufficient documentation

## 2017-06-14 HISTORY — DX: Hyperlipidemia, unspecified: E78.5

## 2017-06-14 HISTORY — DX: Hypothyroidism, unspecified: E03.9

## 2017-06-14 LAB — CBC WITH DIFFERENTIAL/PLATELET
Basophils Absolute: 0.1 10*3/uL (ref 0–0.1)
Basophils Relative: 1 %
Eosinophils Absolute: 0.1 10*3/uL (ref 0–0.7)
Eosinophils Relative: 1 %
HCT: 37.2 % (ref 35.0–47.0)
Hemoglobin: 12.2 g/dL (ref 12.0–16.0)
Lymphocytes Relative: 13 %
Lymphs Abs: 1.3 10*3/uL (ref 1.0–3.6)
MCH: 27.8 pg (ref 26.0–34.0)
MCHC: 32.9 g/dL (ref 32.0–36.0)
MCV: 84.5 fL (ref 80.0–100.0)
Monocytes Absolute: 0.7 10*3/uL (ref 0.2–0.9)
Monocytes Relative: 7 %
Neutro Abs: 8 10*3/uL — ABNORMAL HIGH (ref 1.4–6.5)
Neutrophils Relative %: 78 %
Platelets: 241 10*3/uL (ref 150–440)
RBC: 4.41 MIL/uL (ref 3.80–5.20)
RDW: 14.3 % (ref 11.5–14.5)
WBC: 10.2 10*3/uL (ref 3.6–11.0)

## 2017-06-14 LAB — BASIC METABOLIC PANEL
Anion gap: 11 (ref 5–15)
BUN: 6 mg/dL (ref 6–20)
CO2: 24 mmol/L (ref 22–32)
Calcium: 8.8 mg/dL — ABNORMAL LOW (ref 8.9–10.3)
Chloride: 100 mmol/L — ABNORMAL LOW (ref 101–111)
Creatinine, Ser: 0.78 mg/dL (ref 0.44–1.00)
GFR calc Af Amer: >60 mL/min (ref >60)
GFR calc non Af Amer: >60 mL/min (ref >60)
Glucose, Bld: 368 mg/dL — ABNORMAL HIGH (ref 65–99)
Potassium: 3.6 mmol/L (ref 3.5–5.1)
Sodium: 135 mmol/L (ref 135–145)

## 2017-06-14 LAB — GLUCOSE, CAPILLARY
Glucose-Capillary: 320 mg/dL — ABNORMAL HIGH (ref 65–99)
Glucose-Capillary: 292 mg/dL — ABNORMAL HIGH (ref 65–99)
Glucose-Capillary: 81 mg/dL (ref 65–99)

## 2017-06-14 LAB — MAGNESIUM: Magnesium: 1.8 mg/dL (ref 1.7–2.4)

## 2017-06-14 LAB — SURGICAL PCR SCREEN
MRSA, PCR: NEGATIVE
Staphylococcus aureus: NEGATIVE

## 2017-06-14 MED ORDER — ONDANSETRON HCL 4 MG/2ML IJ SOLN
4.0000 mg | Freq: Four times a day (QID) | INTRAMUSCULAR | Status: DC | PRN
  Administered 2017-06-15: 4 mg via INTRAVENOUS

## 2017-06-14 MED ORDER — GLYCOPYRROLATE 0.2 MG/ML IJ SOLN
INTRAMUSCULAR | Status: AC
  Filled 2017-06-14: qty 1

## 2017-06-14 MED ORDER — INSULIN ASPART 100 UNIT/ML ~~LOC~~ SOLN: 0.0000 [IU] | SUBCUTANEOUS | Status: DC

## 2017-06-14 MED ORDER — PROPOFOL 10 MG/ML IV BOLUS
INTRAVENOUS | Status: AC
  Filled 2017-06-14: qty 20

## 2017-06-14 MED ORDER — INSULIN ASPART 100 UNIT/ML ~~LOC~~ SOLN: 0.0000 [IU] | Freq: Every day | SUBCUTANEOUS | Status: DC

## 2017-06-14 MED ORDER — PANTOPRAZOLE SODIUM 40 MG IV SOLR
40.0000 mg | Freq: Every day | INTRAVENOUS | Status: DC
  Administered 2017-06-14 – 2017-06-15 (×2): 40 mg via INTRAVENOUS
  Filled 2017-06-14 (×2): qty 40

## 2017-06-14 MED ORDER — POLYETHYLENE GLYCOL 3350 17 G PO PACK: 17.0000 g | PACK | Freq: Every day | ORAL | Status: DC | PRN

## 2017-06-14 MED ORDER — INSULIN ASPART 100 UNIT/ML ~~LOC~~ SOLN
0.0000 [IU] | Freq: Three times a day (TID) | SUBCUTANEOUS | Status: DC
  Administered 2017-06-14: 8 [IU] via SUBCUTANEOUS
  Administered 2017-06-15: 2 [IU] via SUBCUTANEOUS
  Administered 2017-06-15 (×2): 5 [IU] via SUBCUTANEOUS
  Administered 2017-06-16: 2 [IU] via SUBCUTANEOUS
  Filled 2017-06-14 (×5): qty 1

## 2017-06-14 MED ORDER — VANCOMYCIN HCL IN DEXTROSE 1-5 GM/200ML-% IV SOLN
1000.0000 mg | Freq: Once | INTRAVENOUS | Status: AC
  Administered 2017-06-14: 1000 mg via INTRAVENOUS
  Filled 2017-06-14: qty 200

## 2017-06-14 MED ORDER — FENTANYL CITRATE (PF) 100 MCG/2ML IJ SOLN
INTRAMUSCULAR | Status: AC
  Filled 2017-06-14: qty 2

## 2017-06-14 MED ORDER — HEPARIN SODIUM (PORCINE) 5000 UNIT/ML IJ SOLN
5000.0000 [IU] | Freq: Three times a day (TID) | INTRAMUSCULAR | Status: DC
  Administered 2017-06-14 – 2017-06-16 (×5): 5000 [IU] via SUBCUTANEOUS
  Filled 2017-06-14 (×5): qty 1

## 2017-06-14 MED ORDER — PRAVASTATIN SODIUM 20 MG PO TABS
40.0000 mg | ORAL_TABLET | Freq: Every evening | ORAL | Status: DC
  Administered 2017-06-15: 40 mg via ORAL
  Filled 2017-06-14: qty 2

## 2017-06-14 MED ORDER — PIPERACILLIN-TAZOBACTAM 3.375 G IVPB
3.3750 g | Freq: Three times a day (TID) | INTRAVENOUS | Status: DC
  Administered 2017-06-14 – 2017-06-16 (×5): 3.375 g via INTRAVENOUS
  Filled 2017-06-14 (×5): qty 50

## 2017-06-14 MED ORDER — LEVOTHYROXINE SODIUM 88 MCG PO TABS
88.0000 ug | ORAL_TABLET | Freq: Every day | ORAL | Status: DC
  Administered 2017-06-16: 88 ug via ORAL
  Filled 2017-06-14 (×2): qty 1

## 2017-06-14 MED ORDER — PIPERACILLIN-TAZOBACTAM 3.375 G IVPB 30 MIN
3.3750 g | Freq: Once | INTRAVENOUS | Status: AC
  Administered 2017-06-14: 3.375 g via INTRAVENOUS
  Filled 2017-06-14: qty 50

## 2017-06-14 MED ORDER — HYDROMORPHONE HCL 1 MG/ML IJ SOLN
0.5000 mg | INTRAMUSCULAR | Status: DC | PRN
  Administered 2017-06-14 – 2017-06-15 (×3): 0.5 mg via INTRAVENOUS
  Filled 2017-06-14 (×3): qty 0.5

## 2017-06-14 MED ORDER — ACETAMINOPHEN 500 MG PO TABS
1000.0000 mg | ORAL_TABLET | Freq: Four times a day (QID) | ORAL | Status: DC
  Administered 2017-06-14 – 2017-06-16 (×5): 1000 mg via ORAL
  Filled 2017-06-14 (×5): qty 2

## 2017-06-14 MED ORDER — OXYCODONE HCL 5 MG PO TABS
5.0000 mg | ORAL_TABLET | ORAL | Status: DC | PRN
  Administered 2017-06-15 (×2): 10 mg via ORAL
  Filled 2017-06-14 (×2): qty 2

## 2017-06-14 MED ORDER — LACTATED RINGERS IV SOLN
125.0000 mL/h | INTRAVENOUS | Status: DC
  Administered 2017-06-14 – 2017-06-15 (×2): 125 mL/h via INTRAVENOUS
  Administered 2017-06-15: 10:00:00 via INTRAVENOUS
  Administered 2017-06-15 – 2017-06-16 (×2): 125 mL/h via INTRAVENOUS

## 2017-06-14 MED ORDER — LIDOCAINE HCL (PF) 2 % IJ SOLN
INTRAMUSCULAR | Status: AC
  Filled 2017-06-14: qty 10

## 2017-06-14 MED ORDER — VANCOMYCIN HCL IN DEXTROSE 1-5 GM/200ML-% IV SOLN
1000.0000 mg | Freq: Two times a day (BID) | INTRAVENOUS | Status: DC
  Administered 2017-06-14 – 2017-06-15 (×2): 1000 mg via INTRAVENOUS
  Filled 2017-06-14 (×3): qty 200

## 2017-06-14 MED ORDER — SUCCINYLCHOLINE CHLORIDE 20 MG/ML IJ SOLN
INTRAMUSCULAR | Status: AC
  Filled 2017-06-14: qty 1

## 2017-06-14 MED ORDER — ONDANSETRON HCL 4 MG/2ML IJ SOLN
INTRAMUSCULAR | Status: AC
  Filled 2017-06-14: qty 2

## 2017-06-14 MED ORDER — ONDANSETRON 4 MG PO TBDP: 4.0000 mg | ORAL_TABLET | Freq: Four times a day (QID) | ORAL | Status: DC | PRN

## 2017-06-14 MED ORDER — ORAL CARE MOUTH RINSE
15.0000 mL | Freq: Two times a day (BID) | OROMUCOSAL | Status: DC
  Administered 2017-06-14 – 2017-06-16 (×3): 15 mL via OROMUCOSAL

## 2017-06-14 NOTE — Progress Notes (Signed)
Due to multiple emergencies Pt will be done in am. She understands. She asked me to have something to eat before MN.

## 2017-06-14 NOTE — Patient Instructions (Signed)
Patient to be direct admitted for right groin abscess.  Spoke with Robin at Direct admit.

## 2017-06-14 NOTE — H&P (Signed)
06/14/2017  Reason for Visit:  Right groin abscess  History of Present Illness: Andrea [name] is a 57 y.o. female who presents with a two week history of a right groin abscess.  She had initially been to Urgent Care on 2/3 and had an incision and drainage procedure there and was started on doxycycline.  She did presented to the emergency room on 2/11 with recurrence of the abscess with severe pain in the right groin.  At that point the patient refused incision and drainage and was started on clindamycin and given pain medication as well for home.  Then yesterday 2/13, the abscess burst and started draining.  She presented again to the emergency room.  They were able to press on the abscess and drain more fluid and referred to surgery for further evaluation.  She still having significant pain.  Denies any fevers or chills.  She does report having nausea and an episode of emesis while they were pressing on the abscess yesterday because of the pain.  She reports the pain is severe enough that she has a hard time walking because of the friction.  Past Medical History:     Past Medical History:  Diagnosis Date  . Diabetes mellitus without complication ([place])   . Thyroid disease      Past Surgical History: None.  Home Medications:        Prior to Admission medications   Medication Sig Start Date End Date Taking? Authorizing Provider  acyclovir (ZOVIRAX) 400 MG tablet acyclovir 400 mg tablet  TAKE 1 TABLET (400 MG TOTAL) BY MOUTH 2 (TWO) TIMES DAILY.   Yes [provider]  aspirin 81 MG chewable tablet Chew 81 mg by mouth daily.   Yes [provider]  Cholecalciferol (VITAMIN D3) 2000 units capsule Take by mouth. 01/23/17  Yes [provider]  clindamycin (CLEOCIN) 300 MG capsule Take 1 capsule (300 mg total) by mouth 4 (four) times daily for 10 days. 06/11/17 06/21/17 Yes Sable Feil, PA-C  cyclobenzaprine (FLEXERIL) 5 MG tablet cyclobenzaprine 5 mg tablet  TAKE ONE TABLET (5 MG TOTAL) BY MOUTH EVERY 8 (EIGHT) HOURS AS NEEDED FOR MUSCLE SPASMS. 03/20/17  Yes [provider]  doxycycline (VIBRAMYCIN) 100 MG capsule TAKE 1 CAPSULE BY MOUTH TWICE A DAY (INS?) 06/04/17  Yes [provider]  HYDROcodone-acetaminophen (NORCO/VICODIN) 5-325 MG tablet Take by mouth. 04/11/14  Yes [provider]  ibuprofen (ADVIL,MOTRIN) 600 MG tablet Take 1 tablet (600 mg total) by mouth every 8 (eight) hours as needed. 06/11/17  Yes Sable Feil, PA-C  insulin aspart protamine- aspart (NOVOLOG MIX 70/30) (70-30) 100 UNIT/ML injection Inject into the skin.   Yes [provider]  insulin NPH-regular Human (NOVOLIN 70/30) (70-30) 100 UNIT/ML injection Take 23 Units before breakfast and dinner 01/04/13  Yes [provider]  levothyroxine (SYNTHROID, LEVOTHROID) 88 MCG tablet Take 88 mcg by mouth daily before breakfast.   Yes [provider]  lisinopril (PRINIVIL,ZESTRIL) 20 MG tablet Take 20 mg by mouth daily.   Yes [provider]  oxyCODONE-acetaminophen (PERCOCET) 7.5-325 MG tablet Take 1 tablet by mouth every 6 (six) hours as needed for severe pain. 06/11/17  Yes Sable Feil, PA-C  pravastatin (PRAVACHOL) 40 MG tablet Take 40 mg by mouth daily.   Yes [provider]    Allergies:     Allergies  Allergen Reactions  . Latex Hives  . Other     Social History:  reports that  has  never smoked. she has never used smokeless tobacco. She reports that she does not drink alcohol. Her drug history is not on file.   Family History:      Family History  Problem Relation Age of Onset  . Breast cancer Neg Hx     Review of Systems: Review of Systems  Constitutional: Negative for chills and fever.  Respiratory: Negative for shortness of breath.   Cardiovascular: Negative for chest pain.  Gastrointestinal: Positive for nausea and vomiting. Negative for abdominal pain, constipation  and diarrhea.  Genitourinary: Negative for dysuria.  Musculoskeletal: Negative for myalgias.  Skin: Negative for rash.       Right groin abscess, with purulent drainage.  Neurological: Negative for dizziness.  Psychiatric/Behavioral: Negative for depression.  All other systems reviewed and are negative.   Physical Exam BP (!) 149/90   Pulse (!) 105   Temp 98.2 F (36.8 C) (Oral)   Ht 5\' 1"  (1.549 m)   Wt 89.7 kg (197 lb 12.8 oz)   BMI 37.37 kg/m  CONSTITUTIONAL: No acute distress HEENT:  Normocephalic, atraumatic, extraocular motion intact. NECK: Trachea is midline, and there is no jugular venous distension.  RESPIRATORY:  Lungs are clear, and breath sounds are equal bilaterally. Normal respiratory effort without pathologic use of accessory muscles. CARDIOVASCULAR: Heart is regular without murmurs, gallops, or rubs. GI: The abdomen is soft, obese, nondistended, with a right groin abscess.  There is an area of fluctuance measuring 4 cm diameter.  There is a prior I&D site as well as the punctate site where the purulent drainage is coming from.  Is trying to seal up already.  Very tender to palpation with significant surrounding induration, cellulitis, and firmness. MUSCULOSKELETAL:  Normal muscle strength and tone in all four extremities.  No peripheral edema or cyanosis. SKIN: Skin turgor is normal. There are no pathologic skin lesions.  NEUROLOGIC:  Motor and sensation is grossly normal.  Cranial nerves are grossly intact. PSYCH:  Alert and oriented to person, place and time. Affect is normal.  Laboratory Analysis: LabResultsLast24Hours  No results found for this or any previous visit (from the past 24 hour(s)).    Imaging: No results found.  Assessment and Plan: This is a 57 y.o. female who presents with right groin abscess has been ongoing for last 2 weeks status post prior I&D on 2/3.  Discussed with the patient that currently given the severe redness of her  abscess and the amount of pain that she is on, we would not be able to do an I&D procedure at bedside in the office.  I do not think that we would be able to anesthetize the area enough to be able to do a successful and thorough I&D in the office.  In light of that we will admit the patient to the hospital today.  She did have lunch at 1:30 PM today and will be n.p.o. now so that we can do her I&D tonight.  She is aware that Dr. Dahlia Byes will be doing the surgery tonight.  She will be n.p.o., with IV fluid hydration, with broad-spectrum IV antibiotic started.  We will get a new set of labs on admission.  Given her type 1 diabetes, she will be on aggressive sliding scale with place a consult to hospitalist for assistance with glucose management.  The patient does report that her glucose has been higher at home with this infection.  She understands this plan and all of her questions have been answered.  Melvyn Neth, [place]

## 2017-06-14 NOTE — Progress Notes (Signed)
Patient direct admit from office.

## 2017-06-14 NOTE — Progress Notes (Signed)
06/14/2017  Reason for Visit:  Right groin abscess  History of Present Illness: Merla Sawka is a 57 y.o. female who presents with a two week history of a right groin abscess.  She had initially been to Urgent Care on 2/3 and had an incision and drainage procedure there and was started on doxycycline.  She did presented to the emergency room on 2/11 with recurrence of the abscess with severe pain in the right groin.  At that point the patient refused incision and drainage and was started on clindamycin and given pain medication as well for home.  Then yesterday 2/13, the abscess burst and started draining.  She presented again to the emergency room.  They were able to press on the abscess and drain more fluid and referred to surgery for further evaluation.  She still having significant pain.  Denies any fevers or chills.  She does report having nausea and an episode of emesis while they were pressing on the abscess yesterday because of the pain.  She reports the pain is severe enough that she has a hard time walking because of the friction.  Past Medical History: Past Medical History:  Diagnosis Date  . Diabetes mellitus without complication ([place])   . Thyroid disease      Past Surgical History: None.  Home Medications: Prior to Admission medications   Medication Sig Start Date End Date Taking? Authorizing Provider  acyclovir (ZOVIRAX) 400 MG tablet acyclovir 400 mg tablet  TAKE 1 TABLET (400 MG TOTAL) BY MOUTH 2 (TWO) TIMES DAILY.   Yes [provider]  aspirin 81 MG chewable tablet Chew 81 mg by mouth daily.   Yes [provider]  Cholecalciferol (VITAMIN D3) 2000 units capsule Take by mouth. 01/23/17  Yes [provider]  clindamycin (CLEOCIN) 300 MG capsule Take 1 capsule (300 mg total) by mouth 4 (four) times daily for 10 days. 06/11/17 06/21/17 Yes Sable Feil, PA-C  cyclobenzaprine (FLEXERIL) 5 MG tablet cyclobenzaprine 5 mg tablet  TAKE ONE TABLET (5 MG  TOTAL) BY MOUTH EVERY 8 (EIGHT) HOURS AS NEEDED FOR MUSCLE SPASMS. 03/20/17  Yes [provider]  doxycycline (VIBRAMYCIN) 100 MG capsule TAKE 1 CAPSULE BY MOUTH TWICE A DAY (INS?) 06/04/17  Yes [provider]  HYDROcodone-acetaminophen (NORCO/VICODIN) 5-325 MG tablet Take by mouth. 04/11/14  Yes [provider]  ibuprofen (ADVIL,MOTRIN) 600 MG tablet Take 1 tablet (600 mg total) by mouth every 8 (eight) hours as needed. 06/11/17  Yes Sable Feil, PA-C  insulin aspart protamine- aspart (NOVOLOG MIX 70/30) (70-30) 100 UNIT/ML injection Inject into the skin.   Yes [provider]  insulin NPH-regular Human (NOVOLIN 70/30) (70-30) 100 UNIT/ML injection Take 23 Units before breakfast and dinner 01/04/13  Yes [provider]  levothyroxine (SYNTHROID, LEVOTHROID) 88 MCG tablet Take 88 mcg by mouth daily before breakfast.   Yes [provider]  lisinopril (PRINIVIL,ZESTRIL) 20 MG tablet Take 20 mg by mouth daily.   Yes [provider]  oxyCODONE-acetaminophen (PERCOCET) 7.5-325 MG tablet Take 1 tablet by mouth every 6 (six) hours as needed for severe pain. 06/11/17  Yes Sable Feil, PA-C  pravastatin (PRAVACHOL) 40 MG tablet Take 40 mg by mouth daily.   Yes [provider]    Allergies: Allergies  Allergen Reactions  . Latex Hives  . Other     Social History:  reports that  has never smoked. she has never used smokeless tobacco. She reports that she does not  drink alcohol. Her drug history is not on file.   Family History: Family History  Problem Relation Age of Onset  . Breast cancer Neg Hx     Review of Systems: Review of Systems  Constitutional: Negative for chills and fever.  Respiratory: Negative for shortness of breath.   Cardiovascular: Negative for chest pain.  Gastrointestinal: Positive for nausea and vomiting. Negative for abdominal pain, constipation and diarrhea.  Genitourinary: Negative for dysuria.   Musculoskeletal: Negative for myalgias.  Skin: Negative for rash.       Right groin abscess, with purulent drainage.  Neurological: Negative for dizziness.  Psychiatric/Behavioral: Negative for depression.  All other systems reviewed and are negative.   Physical Exam BP (!) 149/90   Pulse (!) 105   Temp 98.2 F (36.8 C) (Oral)   Ht 5\' 1"  (1.549 m)   Wt 89.7 kg (197 lb 12.8 oz)   BMI 37.37 kg/m  CONSTITUTIONAL: No acute distress HEENT:  Normocephalic, atraumatic, extraocular motion intact. NECK: Trachea is midline, and there is no jugular venous distension.  RESPIRATORY:  Lungs are clear, and breath sounds are equal bilaterally. Normal respiratory effort without pathologic use of accessory muscles. CARDIOVASCULAR: Heart is regular without murmurs, gallops, or rubs. GI: The abdomen is soft, obese, nondistended, with a right groin abscess.  There is an area of fluctuance measuring 4 cm diameter.  There is a prior I&D site as well as the punctate site where the purulent drainage is coming from.  Is trying to seal up already.  Very tender to palpation with significant surrounding induration, cellulitis, and firmness. MUSCULOSKELETAL:  Normal muscle strength and tone in all four extremities.  No peripheral edema or cyanosis. SKIN: Skin turgor is normal. There are no pathologic skin lesions.  NEUROLOGIC:  Motor and sensation is grossly normal.  Cranial nerves are grossly intact. PSYCH:  Alert and oriented to person, place and time. Affect is normal.  Laboratory Analysis: No results found for this or any previous visit (from the past 24 hour(s)).  Imaging: No results found.  Assessment and Plan: This is a 57 y.o. female who presents with right groin abscess has been ongoing for last 2 weeks status post prior I&D on 2/3.  Discussed with the patient that currently given the severe redness of her abscess and the amount of pain that she is on, we would not be able to do an I&D procedure at  bedside in the office.  I do not think that we would be able to anesthetize the area enough to be able to do a successful and thorough I&D in the office.  In light of that we will admit the patient to the hospital today.  She did have lunch at 1:30 PM today and will be n.p.o. now so that we can do her I&D tonight.  She is aware that Dr. Dahlia Byes will be doing the surgery tonight.  She will be n.p.o., with IV fluid hydration, with broad-spectrum IV antibiotic started.  We will get a new set of labs on admission.  Given her type 1 diabetes, she will be on aggressive sliding scale with place a consult to hospitalist for assistance with glucose management.  The patient does report that her glucose has been higher at home with this infection.  She understands this plan and all of her questions have been answered.    Melvyn Neth, [place]

## 2017-06-14 NOTE — Progress Notes (Signed)
Pharmacy Antibiotic Note  Andrea [name] is a 57 y.o. female admitted on 06/14/2017 with groin abscess.  Pharmacy has been consulted for vancomycin + piperacillin/tazobactam dosing.  Plan: Piperacillin/tazobactam 3.375 g IV q8h EI  Vancomycin 1000 mg IV once followed by vancomycin 1000 mg IV q12h (6 hr stack) VT = 15-20 mcg/mL  Kinetics: Using adjusted body weight = 65 kg, CrCl = 79 mL/min Ke: 0.07 Half-life: 10 hrs Vd; 46 L Cmin ~18 mcg/mL    Temp (24hrs), Avg:98.4 F (36.9 C), Min:98.2 F (36.8 C), Max:98.6 F (37 C)  Recent Labs  Lab 06/14/17 1457  WBC 10.2  CREATININE 0.78    Estimated Creatinine Clearance: 79.1 mL/min (by C-G formula based on SCr of 0.78 mg/dL).    Allergies  Allergen Reactions  . Latex Hives    Antimicrobials this admission: vancomycin 2/14 >>  Piperacillin/tazobactam 2/14 >>   Dose adjustments this admission:  Microbiology results: 2/14 Surgical PCR: Sent  Thank you for allowing pharmacy to be a part of this patient's care.  Lenis Noon, PharmD, BCPS Clinical pharmacist 06/14/2017 3:54 PM

## 2017-06-14 NOTE — Progress Notes (Signed)
Verified per Pharmacy to hang both vancy and zosyn. As they are not to be on call to OR.

## 2017-06-14 NOTE — Consult Note (Signed)
[facility] [place] NAME: Andrea [name]    MR#:  [id number]  DATE OF BIRTH:  [birth year]  DATE OF CONSULT:  06/14/2017  PRIMARY CARE PHYSICIAN: [name], MD   REQUESTING/REFERRING PHYSICIAN: Dr. Hampton Abbot.   CHIEF COMPLAINT:  No chief complaint on file. Management of Diabetes.   HISTORY OF PRESENT ILLNESS:  Andrea [name]  is a 57 y.o. female with a known history of diabetes, hypertension, hyperlipidemia, hypothyroidism, who presented to the hospital from the surgeon's office due to right groin pain and swelling.  Patient has had some right groin pain and swelling over the past 2 weeks progressively getting worse.  She was seen in emergency room yesterday underwent a incision and drainage and put on Keflex and told to follow-up with surgery.  Today she was seen at the general surgeon's office and was still having significant pain and swelling in the right groin therefore sent to the hospital for direct admission for further incision and drainage.  Hospitalist services were contacted for medical management and management of her hyperglycemia.  Patient presently denies any chest pains, shortness of breath, nausea, vomiting, fever, chills or any other associated symptoms presently.  PAST MEDICAL HISTORY:   Past Medical History:  Diagnosis Date  . Diabetes mellitus without complication ([place])   . Hyperlipidemia   . Hypothyroidism   . Thyroid disease     PAST SURGICAL HISTOIRY:  No past surgical history on file.  SOCIAL HISTORY:   Social History   Tobacco Use  . Smoking status: Never Smoker  . Smokeless tobacco: Never Used  Substance Use Topics  . Alcohol use: No    Frequency: Never    FAMILY HISTORY:   Family History  Problem Relation Age of Onset  . Heart disease Father   . Breast cancer Neg Hx     DRUG ALLERGIES:   Allergies  Allergen Reactions  . Latex Hives  . Other     REVIEW OF SYSTEMS:   Review of Systems   Constitutional: Negative for fever and weight loss.  HENT: Negative for congestion, nosebleeds and tinnitus.   Eyes: Negative for blurred vision, double vision and redness.  Respiratory: Negative for cough, hemoptysis and shortness of breath.   Cardiovascular: Negative for chest pain, orthopnea, leg swelling and PND.  Gastrointestinal: Negative for abdominal pain, diarrhea, melena, nausea and vomiting.  Genitourinary: Negative for dysuria, hematuria and urgency.  Musculoskeletal: Negative for falls and joint pain.  Neurological: Negative for dizziness, tingling, sensory change, focal weakness, seizures, weakness and headaches.  Endo/Heme/Allergies: Negative for polydipsia. Does not bruise/bleed easily.  Psychiatric/Behavioral: Negative for depression and memory loss. The patient is not nervous/anxious.      MEDICATIONS AT HOME:   Prior to Admission medications   Medication Sig Start Date End Date Taking? Authorizing Provider  acyclovir (ZOVIRAX) 400 MG tablet acyclovir 400 mg tablet  TAKE 1 TABLET (400 MG TOTAL) BY MOUTH 2 (TWO) TIMES DAILY.    [provider]  aspirin 81 MG chewable tablet Chew 81 mg by mouth daily.    [provider]  Cholecalciferol (VITAMIN D3) 2000 units capsule Take by mouth. 01/23/17   [provider]  clindamycin (CLEOCIN) 300 MG capsule Take 1 capsule (300 mg total) by mouth 4 (four) times daily for 10 days. 06/11/17 06/21/17  Sable Feil, PA-C  cyclobenzaprine (FLEXERIL) 5 MG tablet cyclobenzaprine 5 mg tablet  TAKE ONE TABLET (5 MG TOTAL) BY MOUTH EVERY 8 (EIGHT)  HOURS AS NEEDED FOR MUSCLE SPASMS. 03/20/17   [provider]  doxycycline (VIBRAMYCIN) 100 MG capsule TAKE 1 CAPSULE BY MOUTH TWICE A DAY (INS?) 06/04/17   [provider]  HYDROcodone-acetaminophen (NORCO/VICODIN) 5-325 MG tablet Take by mouth. 04/11/14   [provider]  ibuprofen (ADVIL,MOTRIN) 600 MG tablet Take 1 tablet (600 mg total) by mouth  every 8 (eight) hours as needed. 06/11/17   Sable Feil, PA-C  insulin aspart protamine- aspart (NOVOLOG MIX 70/30) (70-30) 100 UNIT/ML injection Inject into the skin.    [provider]  insulin NPH-regular Human (NOVOLIN 70/30) (70-30) 100 UNIT/ML injection Take 23 Units before breakfast and dinner 01/04/13   [provider]  levothyroxine (SYNTHROID, LEVOTHROID) 88 MCG tablet Take 88 mcg by mouth daily before breakfast.    [provider]  lisinopril (PRINIVIL,ZESTRIL) 20 MG tablet Take 20 mg by mouth daily.    [provider]  oxyCODONE-acetaminophen (PERCOCET) 7.5-325 MG tablet Take 1 tablet by mouth every 6 (six) hours as needed for severe pain. 06/11/17   Sable Feil, PA-C  pravastatin (PRAVACHOL) 40 MG tablet Take 40 mg by mouth daily.    [provider]      VITAL SIGNS:  Blood pressure (!) 145/72, pulse (!) 106, temperature 98.4 F (36.9 C), temperature source Oral, resp. rate 20, SpO2 98 %.  PHYSICAL EXAMINATION:  GENERAL:  57 y.o.-year-old patient lying in the bed in no acute distress.  EYES: Pupils equal, round, reactive to light and accommodation. No scleral icterus. Extraocular muscles intact.  HEENT: Head atraumatic, normocephalic. Oropharynx and nasopharynx clear.  NECK:  Supple, no jugular venous distention. No thyroid enlargement, no tenderness.  LUNGS: Normal breath sounds bilaterally, no wheezing, rales, rhonchi . No use of accessory muscles of respiration.  CARDIOVASCULAR: S1, S2, RRR. No murmurs, rubs, gallops, clicks.  ABDOMEN: Soft, nontender, nondistended. Bowel sounds present. No organomegaly or mass.  EXTREMITIES: No pedal edema, cyanosis, or clubbing.  NEUROLOGIC: Cranial nerves II through XII are intact. No focal motor or sensory deficits appreciated bilaterally  PSYCHIATRIC: The patient is alert and oriented x 3. SKIN: No obvious rash, lesion, or ulcer.  Right groin induration and redness and warmth secondary to  an abscess.  LABORATORY PANEL:   CBC Recent Labs  Lab 06/14/17 1457  WBC 10.2  HGB 12.2  HCT 37.2  PLT 241   ------------------------------------------------------------------------------------------------------------------  Chemistries  Recent Labs  Lab 06/14/17 1457  NA 135  K 3.6  CL 100*  CO2 24  GLUCOSE 368*  BUN 6  CREATININE 0.78  CALCIUM 8.8*  MG 1.8   ------------------------------------------------------------------------------------------------------------------  Cardiac Enzymes No results for input(s): TROPONINI in the last 168 hours. ------------------------------------------------------------------------------------------------------------------  RADIOLOGY:  No results found.   IMPRESSION AND PLAN:   57 year old female with past medical history of diabetes, hypertension, hyperlipidemia, hypothyroidism who presented to the hospital for direct admission due to right groin pain and induration and noted to have a right inguinal abscess.   1.  Right inguinal abscess-patient to go for incision and drainage by general surgery later today.  Continue further care as per general surgery.  2.  Diabetes type 2 without complication- I will place the patient on a moderate sliding scale insulin for now. - Patient is presently n.p.o., and postoperatively can resume her 70/30 NovoLog insulin.  Follow blood sugars.  3.  Essential hypertension-can resume lisinopril postoperatively.  4.  Hyperlipidemia-resume Pravachol.  5.  Hypothyroidism-continue Synthroid.  Thanks so much for the consult  we will follow along with you.    All the records are reviewed and case discussed with Consulting provider. Management plans discussed with the patient, family and they are in agreement.  CODE STATUS: Full code  TOTAL TIME TAKING CARE OF THIS PATIENT: 40 minutes.    Henreitta Leber M.D on 06/14/2017 at 3:26 PM  Between 7am to 6pm - Pager - [phone]  After 6pm go  to [email or web address] - password [email or web address] [facility]  Office  [phone]  CC: Primary care Physician: [name], MD

## 2017-06-15 ENCOUNTER — Encounter: Payer: Self-pay | Admitting: Certified Registered Nurse Anesthetist

## 2017-06-15 ENCOUNTER — Inpatient Hospital Stay: Payer: Medicaid Other | Admitting: Certified Registered"

## 2017-06-15 ENCOUNTER — Encounter
Admission: AD | Disposition: A | Discharge status: Home / Self Care | Payer: Self-pay | Source: Ambulatory Visit | Attending: Surgery

## 2017-06-15 DIAGNOSIS — L02214 Cutaneous abscess of groin: Principal | ICD-10-CM

## 2017-06-15 HISTORY — PX: INCISION AND DRAINAGE ABSCESS: SHX5864

## 2017-06-15 LAB — BASIC METABOLIC PANEL
Anion gap: 11 (ref 5–15)
BUN: 8 mg/dL (ref 6–20)
CO2: 23 mmol/L (ref 22–32)
Calcium: 8.3 mg/dL — ABNORMAL LOW (ref 8.9–10.3)
Chloride: 103 mmol/L (ref 101–111)
Creatinine, Ser: 1.09 mg/dL — ABNORMAL HIGH (ref 0.44–1.00)
GFR calc Af Amer: >60 mL/min (ref >60)
GFR calc non Af Amer: 55 mL/min — ABNORMAL LOW (ref >60)
Glucose, Bld: 292 mg/dL — ABNORMAL HIGH (ref 65–99)
Potassium: 3.7 mmol/L (ref 3.5–5.1)
Sodium: 137 mmol/L (ref 135–145)

## 2017-06-15 LAB — CBC
HCT: 35.4 % (ref 35.0–47.0)
Hemoglobin: 11.9 g/dL — ABNORMAL LOW (ref 12.0–16.0)
MCH: 28.1 pg (ref 26.0–34.0)
MCHC: 33.7 g/dL (ref 32.0–36.0)
MCV: 83.4 fL (ref 80.0–100.0)
Platelets: 209 10*3/uL (ref 150–440)
RBC: 4.24 MIL/uL (ref 3.80–5.20)
RDW: 13.9 % (ref 11.5–14.5)
WBC: 8.7 10*3/uL (ref 3.6–11.0)

## 2017-06-15 LAB — HEMOGLOBIN A1C
Hgb A1c MFr Bld: 11.6 % — ABNORMAL HIGH (ref 4.8–5.6)
Mean Plasma Glucose: 286.22 mg/dL

## 2017-06-15 LAB — GLUCOSE, CAPILLARY
Glucose-Capillary: 75 mg/dL (ref 65–99)
Glucose-Capillary: 211 mg/dL — ABNORMAL HIGH (ref 65–99)
Glucose-Capillary: 209 mg/dL — ABNORMAL HIGH (ref 65–99)
Glucose-Capillary: 166 mg/dL — ABNORMAL HIGH (ref 65–99)
Glucose-Capillary: 220 mg/dL — ABNORMAL HIGH (ref 65–99)
Glucose-Capillary: 122 mg/dL — ABNORMAL HIGH (ref 65–99)
Glucose-Capillary: 100 mg/dL — ABNORMAL HIGH (ref 65–99)

## 2017-06-15 LAB — MAGNESIUM: Magnesium: 1.7 mg/dL (ref 1.7–2.4)

## 2017-06-15 SURGERY — INCISION AND DRAINAGE, ABSCESS
Anesthesia: General | Laterality: Right

## 2017-06-15 MED ORDER — PROPOFOL 10 MG/ML IV BOLUS
INTRAVENOUS | Status: AC
  Filled 2017-06-15: qty 20

## 2017-06-15 MED ORDER — LIDOCAINE HCL (CARDIAC) 20 MG/ML IV SOLN
INTRAVENOUS | Status: DC | PRN
  Administered 2017-06-15: 100 mg via INTRAVENOUS

## 2017-06-15 MED ORDER — HYDROCODONE-ACETAMINOPHEN 5-325 MG PO TABS
1.0000 | ORAL_TABLET | Freq: Four times a day (QID) | ORAL | Status: DC | PRN
Start: 2017-06-15 — End: 2017-06-16

## 2017-06-15 MED ORDER — ONDANSETRON HCL 4 MG/2ML IJ SOLN
INTRAMUSCULAR | Status: AC
Start: 2017-06-15 — End: ?
  Filled 2017-06-15: qty 2

## 2017-06-15 MED ORDER — FENTANYL CITRATE (PF) 100 MCG/2ML IJ SOLN
INTRAMUSCULAR | Status: AC
  Administered 2017-06-15: 25 ug via INTRAVENOUS
  Filled 2017-06-15: qty 2

## 2017-06-15 MED ORDER — INSULIN ASPART 100 UNIT/ML ~~LOC~~ SOLN
5.0000 [IU] | Freq: Three times a day (TID) | SUBCUTANEOUS | Status: DC
  Administered 2017-06-15 – 2017-06-16 (×3): 5 [IU] via SUBCUTANEOUS
  Filled 2017-06-15 (×3): qty 1

## 2017-06-15 MED ORDER — FENTANYL CITRATE (PF) 100 MCG/2ML IJ SOLN
25.0000 ug | INTRAMUSCULAR | Status: AC | PRN
  Administered 2017-06-15 (×6): 25 ug via INTRAVENOUS

## 2017-06-15 MED ORDER — FENTANYL CITRATE (PF) 100 MCG/2ML IJ SOLN
INTRAMUSCULAR | Status: DC | PRN
  Administered 2017-06-15 (×2): 25 ug via INTRAVENOUS
  Administered 2017-06-15: 50 ug via INTRAVENOUS

## 2017-06-15 MED ORDER — ONDANSETRON HCL 4 MG/2ML IJ SOLN
4.0000 mg | Freq: Once | INTRAMUSCULAR | Status: DC | PRN
Start: 2017-06-15 — End: 2017-06-15

## 2017-06-15 MED ORDER — MIDAZOLAM HCL 2 MG/2ML IJ SOLN
INTRAMUSCULAR | Status: DC | PRN
  Administered 2017-06-15: 2 mg via INTRAVENOUS

## 2017-06-15 MED ORDER — LIDOCAINE HCL 1 % IJ SOLN
INTRAMUSCULAR | Status: DC | PRN
  Administered 2017-06-15: 20 mL

## 2017-06-15 MED ORDER — INSULIN DETEMIR 100 UNIT/ML ~~LOC~~ SOLN
10.0000 [IU] | Freq: Two times a day (BID) | SUBCUTANEOUS | Status: DC
  Administered 2017-06-15: 10 [IU] via SUBCUTANEOUS
  Filled 2017-06-15 (×2): qty 0.1

## 2017-06-15 MED ORDER — PROPOFOL 10 MG/ML IV BOLUS
INTRAVENOUS | Status: DC | PRN
  Administered 2017-06-15: 170 mg via INTRAVENOUS

## 2017-06-15 MED ORDER — MIDAZOLAM HCL 2 MG/2ML IJ SOLN
INTRAMUSCULAR | Status: AC
  Filled 2017-06-15: qty 2

## 2017-06-15 MED ORDER — INSULIN DETEMIR 100 UNIT/ML ~~LOC~~ SOLN
20.0000 [IU] | Freq: Two times a day (BID) | SUBCUTANEOUS | Status: DC
  Administered 2017-06-15 – 2017-06-16 (×2): 20 [IU] via SUBCUTANEOUS
  Filled 2017-06-15 (×3): qty 0.2

## 2017-06-15 MED ORDER — LIDOCAINE HCL (PF) 2 % IJ SOLN
INTRAMUSCULAR | Status: AC
  Filled 2017-06-15: qty 10

## 2017-06-15 MED ORDER — FENTANYL CITRATE (PF) 100 MCG/2ML IJ SOLN
INTRAMUSCULAR | Status: AC
  Filled 2017-06-15: qty 2

## 2017-06-15 MED ORDER — VANCOMYCIN HCL IN DEXTROSE 750-5 MG/150ML-% IV SOLN
750.0000 mg | Freq: Two times a day (BID) | INTRAVENOUS | Status: DC
  Administered 2017-06-15 – 2017-06-16 (×2): 750 mg via INTRAVENOUS
  Filled 2017-06-15 (×3): qty 150

## 2017-06-15 SURGICAL SUPPLY — 20 items
BLADE CLIPPER SURG (BLADE) ×3 IMPLANT
BLADE SURG 15 STRL LF DISP TIS (BLADE) ×1 IMPLANT
BLADE SURG 15 STRL SS (BLADE) ×2
BRUSH SCRUB EZ  4% CHG (MISCELLANEOUS)
BRUSH SCRUB EZ 4% CHG (MISCELLANEOUS) IMPLANT
CANISTER SUCT 3000ML PPV (MISCELLANEOUS) ×3 IMPLANT
DRAIN PENROSE 1/4X12 LTX (DRAIN) ×3 IMPLANT
DRAPE LAPAROTOMY 77X122 PED (DRAPES) ×3 IMPLANT
ELECT REM PT RETURN 9FT ADLT (ELECTROSURGICAL) ×3
ELECTRODE REM PT RTRN 9FT ADLT (ELECTROSURGICAL) ×1 IMPLANT
GLOVE BIO SURGEON STRL SZ7 (GLOVE) ×3 IMPLANT
GOWN STRL REUS W/ TWL LRG LVL3 (GOWN DISPOSABLE) ×2 IMPLANT
GOWN STRL REUS W/TWL LRG LVL3 (GOWN DISPOSABLE) ×4
NEEDLE HYPO 22GX1.5 SAFETY (NEEDLE) ×3 IMPLANT
NS IRRIG 1000ML POUR BTL (IV SOLUTION) ×3 IMPLANT
PACK BASIN MINOR ARMC (MISCELLANEOUS) ×3 IMPLANT
PAD ABD DERMACEA PRESS 5X9 (GAUZE/BANDAGES/DRESSINGS) ×3 IMPLANT
SOL PREP PVP 2OZ (MISCELLANEOUS) ×3
SOLUTION PREP PVP 2OZ (MISCELLANEOUS) ×1 IMPLANT
SPONGE LAP 18X18 5 PK (GAUZE/BANDAGES/DRESSINGS) ×3 IMPLANT

## 2017-06-15 NOTE — Brief Op Note (Signed)
06/15/2017  10:09 AM  PATIENT:  Dickey Gave  57 y.o. female  PRE-OPERATIVE DIAGNOSIS:  RIGHT GROIN ABSCESS  POST-OPERATIVE DIAGNOSIS:  RIGHT GROIN ABSCESS  PROCEDURE:  Procedure(s): INCISION AND DRAINAGE ABSCESS (Right)  SURGEON:  Surgeon(s) and Role:    * Clayburn Pert, MD - Primary  PHYSICIAN ASSISTANT:   ASSISTANTS: none   ANESTHESIA:   general  EBL:  5 mL   BLOOD ADMINISTERED:none  DRAINS: Penrose drain in the right groin abscess cavity   LOCAL MEDICATIONS USED:  LIDOCAINE   SPECIMEN:  No Specimen  DISPOSITION OF SPECIMEN:  N/A  COUNTS:  YES  TOURNIQUET:  * No tourniquets in log *  DICTATION: .Dragon Dictation  PLAN OF CARE: return to ward  PATIENT DISPOSITION:  PACU - hemodynamically stable.   Delay start of Pharmacological VTE agent (>24hrs) due to surgical blood loss or risk of bleeding: no

## 2017-06-15 NOTE — Progress Notes (Signed)
Pharmacy Antibiotic Note  Andrea [name] is a 57 y.o. female admitted on 06/14/2017 with groin abscess.  Pharmacy has been consulted for vancomycin + piperacillin/tazobactam dosing.  Plan: Continue Piperacillin/tazobactam 3.375 g IV q8h EI  Patient had a bump in scr this AM. Will decrease dose to vancomycin 750mg  q 12 hr. Trough prior to the 4th new dose. Scr in the AM   Height: 5\' 1"  (154.9 cm) Weight: 200 lb 13.4 oz (91.1 kg) IBW/kg (Calculated) : 47.8  Temp (24hrs), Avg:98 F (36.7 C), Min:97.7 F (36.5 C), Max:98.4 F (36.9 C)  Recent Labs  Lab 06/14/17 1457 06/15/17 0502  WBC 10.2 8.7  CREATININE 0.78 1.09*    Estimated Creatinine Clearance: 58.5 mL/min (A) (by C-G formula based on SCr of 1.09 mg/dL (H)).    Allergies  Allergen Reactions  . Latex Hives    Antimicrobials this admission: vancomycin 2/14 >>  Piperacillin/tazobactam 2/14 >>   Dose adjustments this admission:  Microbiology results: 2/14 Surgical PCR: Sent  Thank you for allowing pharmacy to be a part of this patient's care.  Ramond Dial, PharmD, BCPS Clinical pharmacist 06/15/2017 11:38 AM

## 2017-06-15 NOTE — Progress Notes (Signed)
[place] [facility] NAME: Andrea [name]    MR#:  [id number]  DATE OF BIRTH:  1961/04/13  SUBJECTIVE:  CHIEF COMPLAINT:  No chief complaint on file.  -Came in with right labial and pubic cellulitis with abscess. -For I&D today  REVIEW OF SYSTEMS:  Review of Systems  Constitutional: Negative for chills, fever and malaise/fatigue.  HENT: Negative for congestion, ear discharge, hearing loss and nosebleeds.   Eyes: Negative for blurred vision and double vision.  Respiratory: Negative for cough, shortness of breath and wheezing.   Cardiovascular: Negative for chest pain and palpitations.  Gastrointestinal: Negative for abdominal pain, constipation, diarrhea, nausea and vomiting.  Genitourinary: Negative for dysuria and urgency.  Musculoskeletal: Positive for myalgias.  Neurological: Negative for dizziness, speech change, focal weakness, seizures and headaches.  Psychiatric/Behavioral: Negative for depression.    DRUG ALLERGIES:   Allergies  Allergen Reactions  . Latex Hives    VITALS:  Blood pressure (!) 129/56, pulse 67, temperature 97.7 F (36.5 C), temperature source Oral, resp. rate 18, height 5\' 1"  (1.549 m), weight 91.1 kg (200 lb 13.4 oz), SpO2 98 %.  PHYSICAL EXAMINATION:  Physical Exam  GENERAL:  57 y.o.-year-old patient lying in the bed with no acute distress.  EYES: Pupils equal, round, reactive to light and accommodation. No scleral icterus. Extraocular muscles intact.  HEENT: Head atraumatic, normocephalic. Oropharynx and nasopharynx clear.  NECK:  Supple, no jugular venous distention. No thyroid enlargement, no tenderness.  LUNGS: Normal breath sounds bilaterally, no wheezing, rales,rhonchi or crepitation. No use of accessory muscles of respiration.  CARDIOVASCULAR: S1, S2 normal. No murmurs, rubs, or gallops.  ABDOMEN: Soft, nontender, nondistended. Bowel sounds present. No organomegaly or mass.  GENITOURINARY: Firm  to touch and very tender swelling in the pubic area especially towards the right side extending onto the labia majora EXTREMITIES: No pedal edema, cyanosis, or clubbing.  NEUROLOGIC: Cranial nerves II through XII are intact. Muscle strength 5/5 in all extremities. Sensation intact. Gait not checked.  PSYCHIATRIC: The patient is alert and oriented x 3.  SKIN: No obvious rash, lesion, or ulcer.    LABORATORY PANEL:   CBC Recent Labs  Lab 06/15/17 0502  WBC 8.7  HGB 11.9*  HCT 35.4  PLT 209   ------------------------------------------------------------------------------------------------------------------  Chemistries  Recent Labs  Lab 06/15/17 0502  NA 137  K 3.7  CL 103  CO2 23  GLUCOSE 292*  BUN 8  CREATININE 1.09*  CALCIUM 8.3*  MG 1.7   ------------------------------------------------------------------------------------------------------------------  Cardiac Enzymes No results for input(s): TROPONINI in the last 168 hours. ------------------------------------------------------------------------------------------------------------------  RADIOLOGY:  No results found.  EKG:  No orders found for this or any previous visit.  ASSESSMENT AND PLAN:   57 year old female with type 1 diabetes mellitus presents to hospital secondary to nonhealing right groin swelling and abscess  1. Right pubic cellulitis with possible abscess-appreciate surgical input -For incision and drainage today. Sent for cultures -On vancomycin and Zosyn. Can be discharged on oral Bactrim. Follow up cultures -Further incision management by surgery and outpatient follow-up  2. Type 1 diabetes mellitus with hyperglycemia-A1c is pending. -Likely elevated sugars from underlying infection. - At discharge continue her Novolin 70/30 twice a day with a meal insulin. -If she ends up staying, we will resume home dose and add a meal insulin.  3. Diabetes with proteinuria-on lisinopril, can be resumed at  discharge.  4. Hypothyroidism-continue Synthroid  5. DVT prophylaxis-subcutaneous heparin  Possible discharge today  or tomorrow per surgery Will sign off, consult if needed    All the records are reviewed and case discussed with Care Management/Social Workerr. Management plans discussed with the patient, family and they are in agreement.  CODE STATUS: Full Code  TOTAL TIME TAKING CARE OF THIS PATIENT: 38 minutes.   POSSIBLE D/C TODAY OR TOMORROW, DEPENDING ON CLINICAL CONDITION.   Gladstone Lighter M.D on 06/15/2017 at 9:36 AM  Between 7am to 6pm - Pager - [phone]  After 6pm go to [email or web address] - password [email or web address]  Office  [phone]  CC: Primary care physician; [name], MD

## 2017-06-15 NOTE — Plan of Care (Signed)
Pt went for I&D today. Has been in quite a bit of pain since returning. Changed abd dressing, minimal drianage

## 2017-06-15 NOTE — Progress Notes (Signed)
   Patient seen and examined.  Still needs I&D of right groin abscess. Discussed procedure in detail and all questions answered to her satisfaction.  Plan to proceed to OR as soon as there is availability.  Clayburn Pert, MD [facility]  Day [facility] [phone] Night [facility] [phone]

## 2017-06-15 NOTE — Transfer of Care (Signed)
Immediate Anesthesia Transfer of Care Note  Patient: Andrea [name]  Procedure(s) Performed: INCISION AND DRAINAGE ABSCESS (Right )  Patient Location: PACU  Anesthesia Type:General  Level of Consciousness: awake and patient cooperative  Airway & Oxygen Therapy: Patient Spontanous Breathing and Patient connected to face mask oxygen  Post-op Assessment: Report given to RN and Post -op Vital signs reviewed and stable  Post vital signs: Reviewed and stable  Last Vitals:  Vitals:   06/14/17 2002 06/15/17 0529  BP: 129/61 (!) 129/56  Pulse: 89 67  Resp: 20 18  Temp: 36.7 C 36.5 C  SpO2: 99% 98%    Last Pain:  Vitals:   06/15/17 0529  TempSrc: Oral  PainSc:          Complications: No apparent anesthesia complications

## 2017-06-15 NOTE — Anesthesia Postprocedure Evaluation (Signed)
Anesthesia Post Note  Patient: Andrea [name]  Procedure(s) Performed: INCISION AND DRAINAGE ABSCESS (Right )  Patient location during evaluation: PACU Anesthesia Type: General Level of consciousness: awake and alert Pain management: pain level controlled Vital Signs Assessment: post-procedure vital signs reviewed and stable Respiratory status: spontaneous breathing and respiratory function stable Cardiovascular status: stable Anesthetic complications: no     Last Vitals:  Vitals:   06/15/17 1017 06/15/17 1030  BP: 127/63 138/75  Pulse: 82 79  Resp: (!) 22 19  Temp: 36.5 C   SpO2: 97% 99%    Last Pain:  Vitals:   06/15/17 1040  TempSrc:   PainSc: 6                  KEPHART,WILLIAM K

## 2017-06-15 NOTE — Anesthesia Post-op Follow-up Note (Signed)
Anesthesia QCDR form completed.        

## 2017-06-15 NOTE — Op Note (Signed)
   Pre-operative Diagnosis: Right groin abscess  Post-operative Diagnosis: Same  Procedure performed: Incision and drainage of right groin abscess  Surgeon: Clayburn Pert   Assistants: None  Anesthesia: General LMA anesthesia  ASA Class: 2  Surgeon: Clayburn Pert, MD FACS  Anesthesia: Gen. with endotracheal tube  Assistant: None  Procedure Details  The patient was seen again in the Holding Room. The benefits, complications, treatment options, and expected outcomes were discussed with the patient. The risks of bleeding, infection, recurrence of symptoms, failure to resolve symptoms, any of which could require further surgery were reviewed with the patient.   The patient was taken to Operating Room, identified as Amisha Pospisil and the procedure verified.  A Time Out was held and the above information confirmed.  Prior to the induction of general anesthesia, antibiotic prophylaxis was administered. VTE prophylaxis was in place. General LMA anesthesia was then administered and tolerated well. After the induction, the right groin was prepped with Betadine and draped in the sterile fashion. The patient was positioned in the supine position.  The spontaneously draining aspect of the right groin infection was localized with a 1% lidocaine solution.  The skin was then incised with a 15 blade scalpel medially towards the center of the palpable fluctuance.  Copious purulent fluid was expressed.  The entire abscess cavity was irrigated until the irrigation returned clear.  It was investigated with a hemostat forceps and all loculations were broken up.  The area was irrigated again.  The size of the abscess cavity measured approximately 5 cm in greatest diameter.  A counterincision was made on the most dependent aspect of the abscess cavity and 1/4 inch Penrose drain was placed crossing the abscess cavity through both incisions.  The drain was secured with 3-0 nylon sutures.  The abdomen was  cleaned with a wet and dry lap sponge.  A sterile dressing of ABD pad held in place with mesh underwear was then placed.  Patient was awoken from LMA anesthesia without any difficulty.  She was transferred to the PACU in good condition.  There were no immediate complications and all counts are correct at the end of the procedure.  Findings: Right groin abscess   Estimated Blood Loss: 5 mL         Drains: Quarter-inch Penrose drain         Specimens: None          Complications: None                  Condition: Good   Clayburn Pert, MD, FACS

## 2017-06-15 NOTE — Anesthesia Procedure Notes (Signed)
Procedure Name: LMA Insertion Date/Time: 06/15/2017 9:48 AM Performed by: Eben Burow, CRNA Pre-anesthesia Checklist: Patient identified, Emergency Drugs available, Suction available, Patient being monitored and Timeout performed Patient Re-evaluated:Patient Re-evaluated prior to induction Oxygen Delivery Method: Circle system utilized Preoxygenation: Pre-oxygenation with 100% oxygen Induction Type: IV induction LMA: LMA inserted LMA Size: 4.0 Number of attempts: 1 Placement Confirmation: positive ETCO2 and breath sounds checked- equal and bilateral Tube secured with: Tape Dental Injury: Teeth and Oropharynx as per pre-operative assessment

## 2017-06-15 NOTE — Progress Notes (Addendum)
Inpatient Diabetes Program Recommendations  AACE/ADA: New Consensus Statement on Inpatient Glycemic Control (2015)  Target Ranges:  Prepandial:   less than 140 mg/dL      Peak postprandial:   less than 180 mg/dL (1-2 hours)      Critically ill patients:  140 - 180 mg/dL   Lab Results  Component Value Date   GLUCAP 211 (H) 06/15/2017    Review of Glycemic Control  Diabetes history: Type 1 Outpatient Diabetes medications: Novolin 70/30 34 units bid  Current orders for Inpatient glycemic control: Levemir 10 units bid, Novolog 0-15 units tid, Novolog 0-5 units qhs  Inpatient Diabetes Program Recommendations: Based on her home doses- patient takes 23.8 units of basal and 10.2 units of short acting insulin bid.   Based on this, please consider increasing Levemir to 16 units bid (30% reduction from home dose).  Please consider decreasing Novolog correction to 0-9 units tid.   Once she is eating, she will need to have mealtime insulin since she makes no insulin- consider adding Novolog 8 units tid with meals (20% reduction from home dose).   Gentry Fitz, RN, BA, MHA, CDE Diabetes Coordinator Inpatient Diabetes Program  [phone] (Team Pager) [phone] ([place]) 06/15/2017 8:58 AM

## 2017-06-15 NOTE — Anesthesia Preprocedure Evaluation (Signed)
Anesthesia Evaluation  Patient identified by MRN, date of birth, ID band Patient awake    Reviewed: Allergy & Precautions, NPO status , Patient's Chart, lab work & pertinent test results  History of Anesthesia Complications Negative for: history of anesthetic complications  Airway Mallampati: III       Dental   Pulmonary neg sleep apnea, neg COPD,           Cardiovascular (-) hypertension(-) Past MI and (-) CHF (-) dysrhythmias (-) Valvular Problems/Murmurs     Neuro/Psych neg Seizures    GI/Hepatic Neg liver ROS, neg GERD  ,  Endo/Other  diabetes, Type 1, Insulin DependentHypothyroidism   Renal/GU negative Renal ROS     Musculoskeletal   Abdominal   Peds  Hematology   Anesthesia Other Findings   Reproductive/Obstetrics                             Anesthesia Physical Anesthesia Plan  ASA: III  Anesthesia Plan: General   Post-op Pain Management:    Induction: Intravenous  PONV Risk Score and Plan: 3 and Dexamethasone, Ondansetron and Midazolam  Airway Management Planned: LMA and Oral ETT  Additional Equipment:   Intra-op Plan:   Post-operative Plan:   Informed Consent: I have reviewed the patients History and Physical, chart, labs and discussed the procedure including the risks, benefits and alternatives for the proposed anesthesia with the patient or authorized representative who has indicated his/her understanding and acceptance.     Plan Discussed with:   Anesthesia Plan Comments:         Anesthesia Quick Evaluation

## 2017-06-16 ENCOUNTER — Encounter: Payer: Self-pay | Admitting: General Surgery

## 2017-06-16 LAB — CREATININE, SERUM
Creatinine, Ser: 1.82 mg/dL — ABNORMAL HIGH (ref 0.44–1.00)
GFR calc Af Amer: 34 mL/min — ABNORMAL LOW (ref >60)
GFR calc non Af Amer: 30 mL/min — ABNORMAL LOW (ref >60)

## 2017-06-16 LAB — GLUCOSE, CAPILLARY: Glucose-Capillary: 150 mg/dL — ABNORMAL HIGH (ref 65–99)

## 2017-06-16 MED ORDER — PIPERACILLIN-TAZOBACTAM 3.375 G IVPB: 3.3750 g | Freq: Two times a day (BID) | INTRAVENOUS | Status: DC

## 2017-06-16 MED ORDER — SULFAMETHOXAZOLE-TRIMETHOPRIM 800-160 MG PO TABS: 1.0000 | ORAL_TABLET | Freq: Two times a day (BID) | ORAL | 0 refills | Status: DC

## 2017-06-16 MED ORDER — OXYCODONE-ACETAMINOPHEN 7.5-325 MG PO TABS: 1.0000 | ORAL_TABLET | Freq: Four times a day (QID) | ORAL | 0 refills | Status: DC | PRN

## 2017-06-16 NOTE — Progress Notes (Signed)
Pharmacy Antibiotic Note  Andrea [name] is a 57 y.o. female admitted on 06/14/2017 with groin abscess.  Pharmacy has been consulted for vancomycin + piperacillin/tazobactam dosing.  Plan: Scr has increased significantly both days. Will discontinue Vancomycin dosing for now. Will recheck Vancomycin random level @ 0900. Will order Serum creatinine with am labs. If level within range, will redose Vancomycin.  Zosyn: Will dose Zosyn as if CrCl <10 ml/min since patient in AKI and CrCl can't accurately measure true renal function.     Height: 5\' 1"  (154.9 cm) Weight: 200 lb 13.4 oz (91.1 kg) IBW/kg (Calculated) : 47.8  Temp (24hrs), Avg:98.2 F (36.8 C), Min:97.8 F (36.6 C), Max:98.6 F (37 C)  Recent Labs  Lab 06/14/17 1457 06/15/17 0502 06/16/17 0440  WBC 10.2 8.7  --   CREATININE 0.78 1.09* 1.82*    Estimated Creatinine Clearance: 35 mL/min (A) (by C-G formula based on SCr of 1.82 mg/dL (H)).    Allergies  Allergen Reactions  . Latex Hives    Antimicrobials this admission: vancomycin 2/14 >>  Piperacillin/tazobactam 2/14 >>   Dose adjustments this admission:  Microbiology results: 2/14 Surgical PCR: Sent  Thank you for allowing pharmacy to be a part of this patient's care.  Larene Beach, PharmD, BCPS Clinical pharmacist 06/16/2017 10:30 AM

## 2017-06-16 NOTE — Progress Notes (Signed)
[place] at [place] NAME: Andrea [name]    MR#:  [id number]  DATE OF BIRTH:  04-27-1961  SUBJECTIVE:  CHIEF COMPLAINT:  No chief complaint on file.  -Came in with right labial and pubic cellulitis with abscess. -s/p I & D, have pain but feels better now. - Plan for d/c by surgery.  REVIEW OF SYSTEMS:  Review of Systems  Constitutional: Negative for chills, fever and malaise/fatigue.  HENT: Negative for congestion, ear discharge, hearing loss and nosebleeds.   Eyes: Negative for blurred vision and double vision.  Respiratory: Negative for cough, shortness of breath and wheezing.   Cardiovascular: Negative for chest pain and palpitations.  Gastrointestinal: Negative for abdominal pain, constipation, diarrhea, nausea and vomiting.  Genitourinary: Negative for dysuria and urgency.  Musculoskeletal: Positive for myalgias.  Neurological: Negative for dizziness, speech change, focal weakness, seizures and headaches.  Psychiatric/Behavioral: Negative for depression.    DRUG ALLERGIES:   Allergies  Allergen Reactions  . Latex Hives    VITALS:  Blood pressure 139/63, pulse 73, temperature 98.6 F (37 C), temperature source Oral, resp. rate 20, height 5\' 1"  (1.549 m), weight 91.1 kg (200 lb 13.4 oz), SpO2 100 %.  PHYSICAL EXAMINATION:  Physical Exam  GENERAL:  57 y.o.-year-old patient lying in the bed with no acute distress.  EYES: Pupils equal, round, reactive to light and accommodation. No scleral icterus. Extraocular muscles intact.  HEENT: Head atraumatic, normocephalic. Oropharynx and nasopharynx clear.  NECK:  Supple, no jugular venous distention. No thyroid enlargement, no tenderness.  LUNGS: Normal breath sounds bilaterally, no wheezing, rales,rhonchi or crepitation. No use of accessory muscles of respiration.  CARDIOVASCULAR: S1, S2 normal. No murmurs, rubs, or gallops.  ABDOMEN: Soft, nontender, nondistended. Bowel sounds  present. No organomegaly or mass.  GENITOURINARY: post surgical dressing present. EXTREMITIES: No pedal edema, cyanosis, or clubbing.  NEUROLOGIC: Cranial nerves II through XII are intact. Muscle strength 5/5 in all extremities. Sensation intact. Gait not checked.  PSYCHIATRIC: The patient is alert and oriented x 3.  SKIN: No obvious rash, lesion, or ulcer.    LABORATORY PANEL:   CBC Recent Labs  Lab 06/15/17 0502  WBC 8.7  HGB 11.9*  HCT 35.4  PLT 209   ------------------------------------------------------------------------------------------------------------------  Chemistries  Recent Labs  Lab 06/15/17 0502 06/16/17 0440  NA 137  --   K 3.7  --   CL 103  --   CO2 23  --   GLUCOSE 292*  --   BUN 8  --   CREATININE 1.09* 1.82*  CALCIUM 8.3*  --   MG 1.7  --    ------------------------------------------------------------------------------------------------------------------  Cardiac Enzymes No results for input(s): TROPONINI in the last 168 hours. ------------------------------------------------------------------------------------------------------------------  RADIOLOGY:  No results found.  EKG:  No orders found for this or any previous visit.  ASSESSMENT AND PLAN:   57 year old female with type 1 diabetes mellitus presents to hospital secondary to nonhealing right groin swelling and abscess  1. Right pubic cellulitis with possible abscess-appreciate surgical input  -On vancomycin and Zosyn.   discharged on oral Bactrim. Follow up cultures S/p I & D  2. Type 1 diabetes mellitus with hyperglycemia-A1c is pending. -Likely elevated sugars from underlying infection. - At discharge continue her Novolin 70/30 twice a day with a meal insulin.  3. Diabetes with proteinuria-on lisinopril, can be resumed at discharge.  4. Hypothyroidism-continue Synthroid  5. DVT prophylaxis-subcutaneous heparin  Discharge by primary team.   All the  records are reviewed  and case discussed with Care Management/Social Workerr. Management plans discussed with the patient, family and they are in agreement.  CODE STATUS: Full Code  TOTAL TIME TAKING CARE OF THIS PATIENT: 35 minutes.   POSSIBLE D/C TODAY OR TOMORROW, DEPENDING ON CLINICAL CONDITION.   Vaughan Basta M.D on 06/16/2017 at 11:21 AM  Between 7am to 6pm - Pager - [phone]  After 6pm go to [email or web address] - password [email or web address] [facility]  Office  [phone]  CC: Primary care physician; [name], MD

## 2017-06-16 NOTE — Discharge Instructions (Signed)
Incision and Drainage, Care After Refer to this sheet in the next few weeks. These instructions provide you with information about caring for yourself after your procedure. Your health care provider may also give you more specific instructions. Your treatment has been planned according to current medical practices, but problems sometimes occur. Call your health care provider if you have any problems or questions after your procedure. What can I expect after the procedure? After the procedure, it is common to have:  Pain or discomfort around your incision site.  Drainage from your incision.  Follow these instructions at home:  Take over-the-counter and prescription medicines only as told by your health care provider.  If you were prescribed an antibiotic medicine, take it as told by your health care provider.Do not stop taking the antibiotic even if you start to feel better.  Followinstructions from your health care provider about: ? How to take care of your incision. ? When and how you should change your packing and bandage (dressing). Wash your hands with soap and water before you change your dressing. If soap and water are not available, use hand sanitizer. ? When you should remove your dressing.  Do not take baths, swim, or use a hot tub until your health care provider approves.  Keep all follow-up visits as told by your health care provider. This is important.  Check your incision area every day for signs of infection. Check for: ? More redness, swelling, or pain. ? More fluid or blood. ? Warmth. ? Pus or a bad smell. Contact a health care provider if:  Your cyst or abscess returns.  You have a fever.  You have more redness, swelling, or pain around your incision.  You have more fluid or blood coming from your incision.  Your incision feels warm to the touch.  You have pus or a bad smell coming from your incision. Get help right away if:  You have severe pain or  bleeding.  You cannot eat or drink without vomiting.  You have decreased urine output.  You become short of breath.  You have chest pain.  You cough up blood.  The area where the incision and drainage occurred becomes numb or it tingles. This information is not intended to replace advice given to you by your health care provider. Make sure you discuss any questions you have with your health care provider. Document Released: 07/10/2011 Document Revised: 09/17/2015 Document Reviewed: 02/05/2015 Elsevier Interactive Patient Education  2018 [employer]. Percutaneous Abscess Drain, Care After This sheet gives you information about how to care for yourself after your procedure. Your health care provider may also give you more specific instructions. If you have problems or questions, contact   Skin Abscess A skin abscess is an infected area on or under your skin that contains pus and other material. An abscess can happen almost anywhere on your body. Some abscesses break open (rupture) on their own. Most continue to get worse unless they are treated. The infection can spread deeper into the body and into your blood, which can make you feel sick. Treatment usually involves draining the abscess. Follow these instructions at home: Abscess Care  If you have an abscess that has not drained, place a warm, clean, wet washcloth over the abscess several times a day. Do this as told by your doctor.  Follow instructions from your doctor about how to take care of your abscess. Make sure you: ? Cover the abscess with a bandage (dressing). ?  Change your bandage or gauze as told by your doctor. ? Wash your hands with soap and water before you change the bandage or gauze. If you cannot use soap and water, use hand sanitizer.  Check your abscess every day for signs that the infection is getting worse. Check for: ? More redness, swelling, or pain. ? More fluid or blood. ? Warmth. ? More pus or a bad  smell. Medicines   Take over-the-counter and prescription medicines only as told by your doctor.  If you were prescribed an antibiotic medicine, take it as told by your doctor. Do not stop taking the antibiotic even if you start to feel better. General instructions  To avoid spreading the infection: ? Do not share personal care items, towels, or hot tubs with others. ? Avoid making skin-to-skin contact with other people.  Keep all follow-up visits as told by your doctor. This is important. Contact a doctor if:  You have more redness, swelling, or pain around your abscess.  You have more fluid or blood coming from your abscess.  Your abscess feels warm when you touch it.  You have more pus or a bad smell coming from your abscess.  You have a fever.  Your muscles ache.  You have chills.  You feel sick. Get help right away if:  You have very bad (severe) pain.  You see red streaks on your skin spreading away from the abscess. This information is not intended to replace advice given to you by your health care provider. Make sure you discuss any questions you have with your health care provider. Document Released: 10/04/2007 Document Revised: 12/12/2015 Document Reviewed: 02/24/2015 Elsevier Interactive Patient Education  2018 [employer]. your health care provider. What can I expect after the procedure? After your procedure, it is common to have:  A small amount of bruising and discomfort in the area where the drainage tube (catheter) was placed.  Sleepiness and fatigue. This should go away after the medicines you were given have worn off.  Follow these instructions at home: Incision care  Follow instructions from your health care provider about how to take care of your incision. Make sure you: ? Wash your hands with soap and water before you change your bandage (dressing). If soap and water are not available, use hand sanitizer. ? Change your dressing as told by  your health care provider. ? Leave stitches (sutures), skin glue, or adhesive strips in place. These skin closures may need to stay in place for 2 weeks or longer. If adhesive strip edges start to loosen and curl up, you may trim the loose edges. Do not remove adhesive strips completely unless your health care provider tells you to do that.  Check your incision area every day for signs of infection. Check for: ? More redness, swelling, or pain. ? More fluid or blood. ? Warmth. ? Pus or a bad smell. ? Fluid leaking from around your catheter (instead of fluid draining through your catheter). Drain care  Follow instructions from your health care provider about Changing the bandage over the drain as needed.If directed, write down the following information every time you empty your bag: ? The date and time. ? The amount of drainage. General instructions  Rest at home for 1-2 days after your procedure. Return to your normal activities as told by your health care provider.  Do not take baths, swim, or use a hot tub for 24 hours after your procedure, or until your health care provider  says that this is okay.  Take over-the-counter and prescription medicines only as told by your health care provider.  Keep all follow-up visits as told by your health care provider. This is important. Contact a health care provider if:  You have less than 10 mL of drainage a day for 2-3 days in a row, or as directed by your health care provider.  You have more redness, swelling, or pain around your incision area.  You have more fluid or blood coming from your incision area.  Your incision area feels warm to the touch.  You have pus or a bad smell coming from your incision area.  You have fluid leaking from around your catheter (instead of through your catheter).  You have a fever or chills.  You have pain that does not get better with medicine. Get help right away if:  Your catheter comes out.  You  suddenly stop having drainage from your catheter.  You suddenly have blood in the fluid that is draining from your catheter.  You become dizzy or you faint.  You develop a rash.  You have nausea or vomiting.  You have difficulty breathing or you feel short of breath.  You develop chest pain.  You have problems with your speech or vision.  You have trouble balancing or moving your arms or legs. Summary  It is common to have a small amount of bruising and discomfort in the area where the drainage tube (catheter) was placed.  You may be directed to record the amount of drainage from the bag every time you empty it.  Follow instructions from your health care provider about emptying and cleaning your catheter and collection bag. This information is not intended to replace advice given to you by your health care provider. Make sure you discuss any questions you have with your health care provider. Document Released: 09/01/2013 Document Revised: 03/09/2016 Document Reviewed: 03/09/2016 Elsevier Interactive Patient Education  2017 [employer].

## 2017-06-16 NOTE — Discharge Summary (Signed)
Patient ID: Andrea [name] MRN: [id number] DOB/AGE: 05/04/1960 57 y.o.  Admit date: 06/14/2017 Discharge date: 06/16/2017  Discharge Diagnoses:  Right groin abscess  Procedures Performed: Incision and Drainage of right groin abscess   Discharged Condition: good  Hospital Course: Patient admitted from clinic. Went to OR for drainage of right groin abscess. Able to be discharged home with Penrose in place the first post operative day. Her exam was benign at the time of discharge.  Discharge Orders: Discharge Instructions    Call MD for:  persistant nausea and vomiting   Complete by:  As directed    Call MD for:  redness, tenderness, or signs of infection (pain, swelling, redness, odor or [place]/yellow discharge around incision site)   Complete by:  As directed    Call MD for:  severe uncontrolled pain   Complete by:  As directed    Call MD for:  temperature >100.4   Complete by:  As directed    Diet - low sodium heart healthy   Complete by:  As directed    Increase activity slowly   Complete by:  As directed       Disposition: 01-Home or Self Care  Discharge Medications: Allergies as of 06/16/2017      Reactions   Latex Hives      Medication List    TAKE these medications   aspirin EC 81 MG tablet Take 81 mg by mouth daily.   Cholecalciferol 2000 units Tabs Take 2,000 Units by mouth 2 (two) times daily.   clindamycin 300 MG capsule Commonly known as:  CLEOCIN Take 1 capsule (300 mg total) by mouth 4 (four) times daily for 10 days.   cyclobenzaprine 5 MG tablet Commonly known as:  FLEXERIL Take 5 mg by mouth every 8 (eight) hours as needed for muscle spasms.   ibuprofen 600 MG tablet Commonly known as:  ADVIL,MOTRIN Take 1 tablet (600 mg total) by mouth every 8 (eight) hours as needed. What changed:  reasons to take this   insulin NPH-regular Human (70-30) 100 UNIT/ML injection Commonly known as:  NOVOLIN 70/30 Inject 34 Units into the skin 2 (two) times daily  before a meal.   levothyroxine 88 MCG tablet Commonly known as:  SYNTHROID, LEVOTHROID Take 88 mcg by mouth daily before breakfast.   lisinopril 20 MG tablet Commonly known as:  PRINIVIL,ZESTRIL Take 20 mg by mouth daily.   oxyCODONE-acetaminophen 7.5-325 MG tablet Commonly known as:  PERCOCET Take 1 tablet by mouth every 6 (six) hours as needed for severe pain.   pravastatin 40 MG tablet Commonly known as:  PRAVACHOL Take 40 mg by mouth every evening.   senna-docusate 8.6-50 MG tablet Commonly known as:  Senokot-S Take 1 tablet by mouth daily.   sulfamethoxazole-trimethoprim 800-160 MG tablet Commonly known as:  BACTRIM DS,SEPTRA DS Take 1 tablet by mouth 2 (two) times daily.        Follwup: Follow-up Information    Clayburn Pert, MD. Go in 4 day(s).   Specialty:  General Surgery Why:  Report to clinic Wed morning at [facility] for a 9:15am appointment Contact information: [address] [place] [address] [phone]           Signed: Clayburn Pert 06/16/2017, 7:51 AM

## 2017-06-16 NOTE — Progress Notes (Signed)
Dickey Gave  A and O x 4. VSS. Pt tolerating diet well. No complaints of pain or nausea. IV removed intact, prescriptions given. Medications returned to pt from pharmacy and security brought belongings from the safe. Also teach pt how to do her dressing changes and given supplies.  Pt voiced understanding of discharge instructions with no further questions. Pt discharged via wheelchair with axillary.    Allergies as of 06/16/2017      Reactions   Latex Hives      Medication List    TAKE these medications   aspirin EC 81 MG tablet Take 81 mg by mouth daily.   Cholecalciferol 2000 units Tabs Take 2,000 Units by mouth 2 (two) times daily.   clindamycin 300 MG capsule Commonly known as:  CLEOCIN Take 1 capsule (300 mg total) by mouth 4 (four) times daily for 10 days.   cyclobenzaprine 5 MG tablet Commonly known as:  FLEXERIL Take 5 mg by mouth every 8 (eight) hours as needed for muscle spasms.   ibuprofen 600 MG tablet Commonly known as:  ADVIL,MOTRIN Take 1 tablet (600 mg total) by mouth every 8 (eight) hours as needed. What changed:  reasons to take this   insulin NPH-regular Human (70-30) 100 UNIT/ML injection Commonly known as:  NOVOLIN 70/30 Inject 34 Units into the skin 2 (two) times daily before a meal.   levothyroxine 88 MCG tablet Commonly known as:  SYNTHROID, LEVOTHROID Take 88 mcg by mouth daily before breakfast.   lisinopril 20 MG tablet Commonly known as:  PRINIVIL,ZESTRIL Take 20 mg by mouth daily.   oxyCODONE-acetaminophen 7.5-325 MG tablet Commonly known as:  PERCOCET Take 1 tablet by mouth every 6 (six) hours as needed for severe pain.   pravastatin 40 MG tablet Commonly known as:  PRAVACHOL Take 40 mg by mouth every evening.   senna-docusate 8.6-50 MG tablet Commonly known as:  Senokot-S Take 1 tablet by mouth daily.   sulfamethoxazole-trimethoprim 800-160 MG tablet Commonly known as:  BACTRIM DS,SEPTRA DS Take 1 tablet by mouth 2 (two) times  daily.       Vitals:   06/15/17 2050 06/16/17 0557  BP: 128/71 139/63  Pulse: 63 73  Resp: 20 20  Temp: 98.2 F (36.8 C) 98.6 F (37 C)  SpO2: 95% 100%    Andrea [name]

## 2017-06-19 ENCOUNTER — Telehealth: Payer: Self-pay | Admitting: General Surgery

## 2017-06-19 NOTE — Telephone Encounter (Signed)
Patient states that she has had a lot of nausea and no appetite since her discharge date. She would like to have a nurse call her.

## 2017-06-19 NOTE — Telephone Encounter (Signed)
Returned call to patient at this time, no answer.

## 2017-06-20 ENCOUNTER — Inpatient Hospital Stay: Payer: Self-pay | Admitting: General Surgery

## 2017-06-21 ENCOUNTER — Ambulatory Visit: Payer: Self-pay | Admitting: General Surgery

## 2017-06-22 ENCOUNTER — Encounter: Payer: Self-pay | Admitting: General Surgery

## 2017-06-22 ENCOUNTER — Ambulatory Visit (INDEPENDENT_AMBULATORY_CARE_PROVIDER_SITE_OTHER): Payer: Medicaid Other | Admitting: General Surgery

## 2017-06-22 VITALS — BP 112/64 | HR 80 | Temp 98.0°F | Ht 61.0 in | Wt 195.0 lb

## 2017-06-22 DIAGNOSIS — Z4889 Encounter for other specified surgical aftercare: Secondary | ICD-10-CM

## 2017-06-22 NOTE — Progress Notes (Signed)
Outpatient Surgical Follow Up  06/22/2017  Andrea [name] is an 57 y.o. female.   Chief Complaint  Patient presents with  . Hospitalization Follow-up    Hospital F/U: Abscess of Right Groin/ I&D 06/14/2017 Dr. Adonis Huguenin    HPI: 57 year old female returns to clinic for follow-up now 8 days status post I&D of a right groin abscess.  She reports she has had some chills since leaving the hospital but denies any fevers.  She is eating well and having normal bowel function.  She denies any chest pain, shortness of breath, diarrhea, constipation.  The drainage on her dressings has gotten less and less and states is now just a spot once or twice a day.  Past Medical History:  Diagnosis Date  . Diabetes mellitus without complication ([place])   . Hyperlipidemia   . Hypothyroidism   . Thyroid disease     Past Surgical History:  Procedure Laterality Date  . INCISION AND DRAINAGE ABSCESS Right 06/15/2017   Procedure: INCISION AND DRAINAGE ABSCESS;  Surgeon: Clayburn Pert, MD;  Location: [facility] ORS;  Service: General;  Laterality: Right;    Family History  Problem Relation Age of Onset  . Heart disease Father   . Breast cancer Neg Hx     Social History:  reports that  has never smoked. she has never used smokeless tobacco. She reports that she does not drink alcohol or use drugs.  Allergies:  Allergies  Allergen Reactions  . Latex Hives    Medications reviewed.    ROS A multipoint review of systems was completed, all pertinent positives and negatives are documented within the HPI and the remainder are negative   BP 112/64   Pulse 80   Temp 98 F (36.7 C) (Oral)   Ht 5\' 1"  (1.549 m)   Wt 88.5 kg (195 lb)   BMI 36.84 kg/m   Physical Exam General: No acute distress Chest: Clear to auscultation Heart: Regular rate and rhythm Abdomen: Soft and nontender.  Penrose drain in the right groin without any evidence of purulence or erythema.    No results found for this or any  previous visit (from the past 48 hour(s)). No results found.  Assessment/Plan:  1. Aftercare following surgery 57 year old female status post I&D of right groin abscess.  Doing very well.  Penrose drain removed in clinic without any difficulty.  Discussed continued wound care therapy.  Discussed continuing her antibiotics.  She will follow-up in clinic one more time next week to ensure continued wound healing.     Clayburn Pert, MD [facility] General Surgeon  06/22/2017,11:44 AM

## 2017-06-22 NOTE — Patient Instructions (Signed)
This ar will continue to drain please keep it covered and dry.  We will see you back as listed below:  Please call our office if you have any questions.

## 2017-06-27 ENCOUNTER — Ambulatory Visit (INDEPENDENT_AMBULATORY_CARE_PROVIDER_SITE_OTHER): Payer: Medicaid Other | Admitting: Surgery

## 2017-06-27 VITALS — BP 130/80 | HR 87 | Temp 98.2°F | Ht 61.0 in | Wt 190.0 lb

## 2017-06-27 DIAGNOSIS — L02214 Cutaneous abscess of groin: Secondary | ICD-10-CM

## 2017-06-27 NOTE — Patient Instructions (Signed)

## 2017-06-27 NOTE — Progress Notes (Signed)
Outpatient postop visit  06/27/2017  Andrea [name] is an 57 y.o. female.    Procedure: I&D of right groin abscess  CC:  HPI: This a patient who underwent the I&D of a right groin abscess.  Operative report is been reviewed.  There are no cultures available in the results section.  Penrose drain was removed at the last visit.  Patient has no complaints at this time but is still using a gauze dressing because of minimal drainage  Medications reviewed.    Physical Exam:  There were no vitals taken for this visit.    PE:  No erythema no drainage there is a 1 mm area still open and a spot of blood-tinged fluid that is dried on the gauze pad.   Assessment/Plan:  No sign of ongoing infection.  Patient is finished her antibiotics.  Recommend follow-up on an as-needed basis.  Florene Glen, MD, FACS

## 2017-09-27 ENCOUNTER — Other Ambulatory Visit: Payer: Self-pay | Admitting: Internal Medicine

## 2017-09-27 DIAGNOSIS — Z1231 Encounter for screening mammogram for malignant neoplasm of breast: Secondary | ICD-10-CM

## 2017-11-07 ENCOUNTER — Ambulatory Visit
Admission: RE | Admit: 2017-11-07 | Discharge: 2017-11-07 | Disposition: A | Discharge status: Home / Self Care | Payer: Medicaid Other | Source: Ambulatory Visit | Attending: Internal Medicine | Admitting: Internal Medicine

## 2017-11-07 DIAGNOSIS — Z1231 Encounter for screening mammogram for malignant neoplasm of breast: Secondary | ICD-10-CM | POA: Insufficient documentation

## 2017-11-07 IMAGING — MG MM DIGITAL SCREENING BILAT W/ TOMO W/ CAD
8 series · 8 of 24 positions shown · non-contrast
Comparison: Previous exam(s).

CLINICAL DATA: Screening.

EXAM:
DIGITAL SCREENING BILATERAL MAMMOGRAM WITH TOMO AND CAD

[L CC synth-2D]
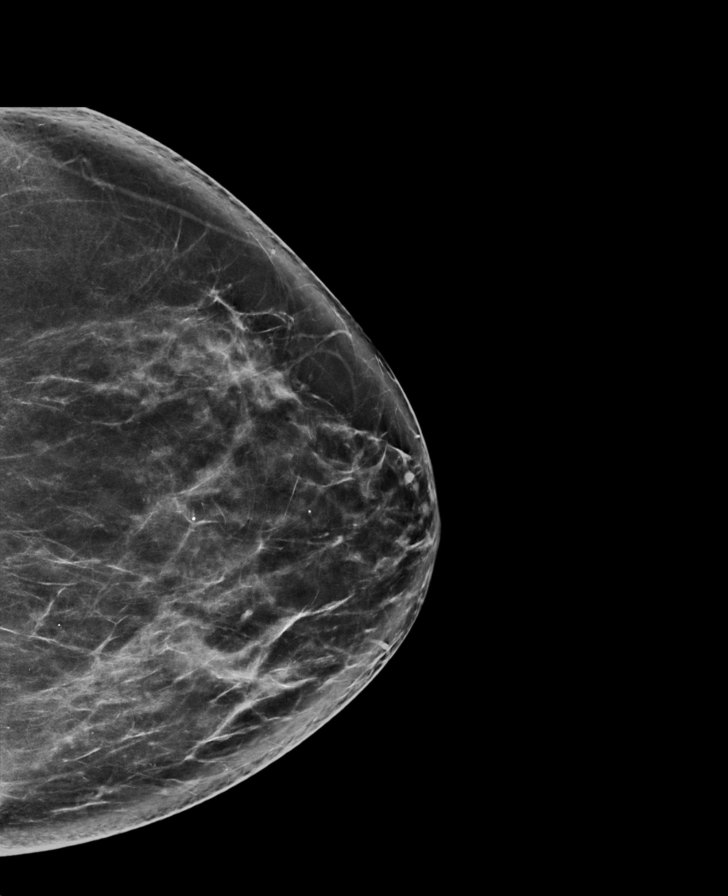

[R MLO synth-2D]
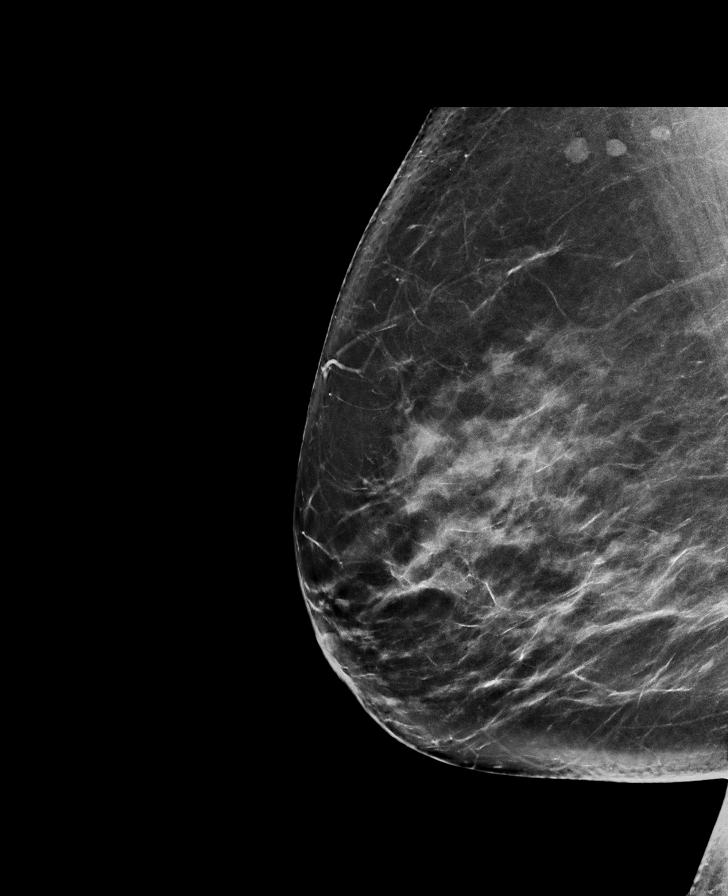

[L MLO synth-2D]
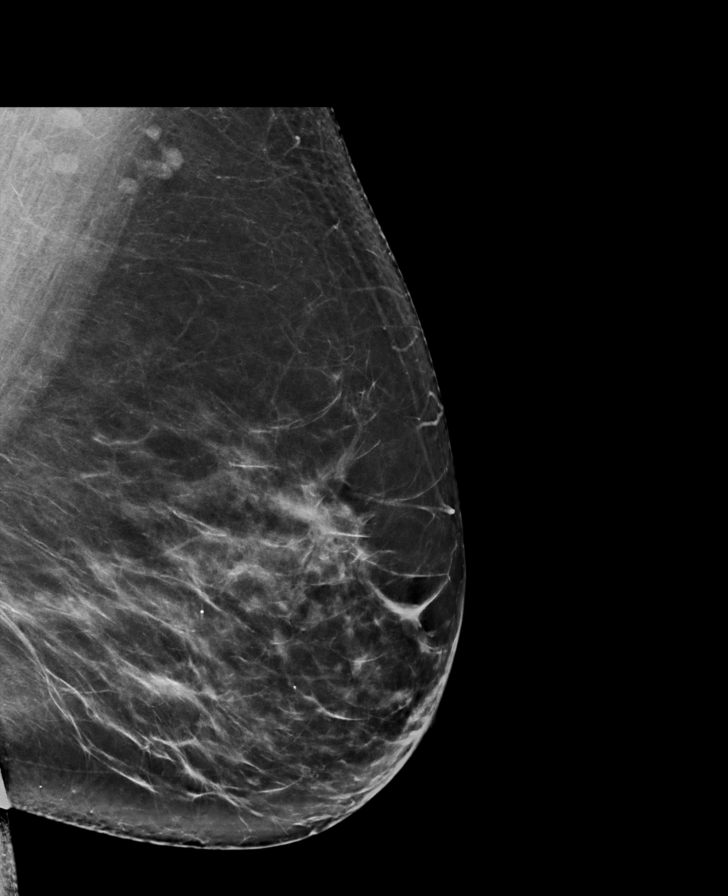

[R CC synth-2D]
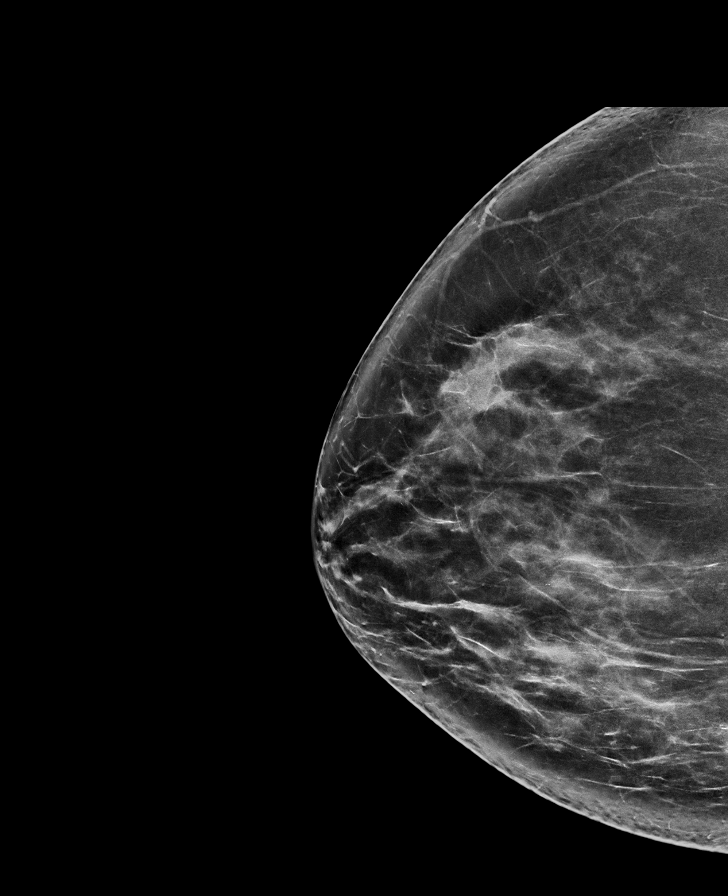

[L CC tomo · tomo slice 44/87.0]
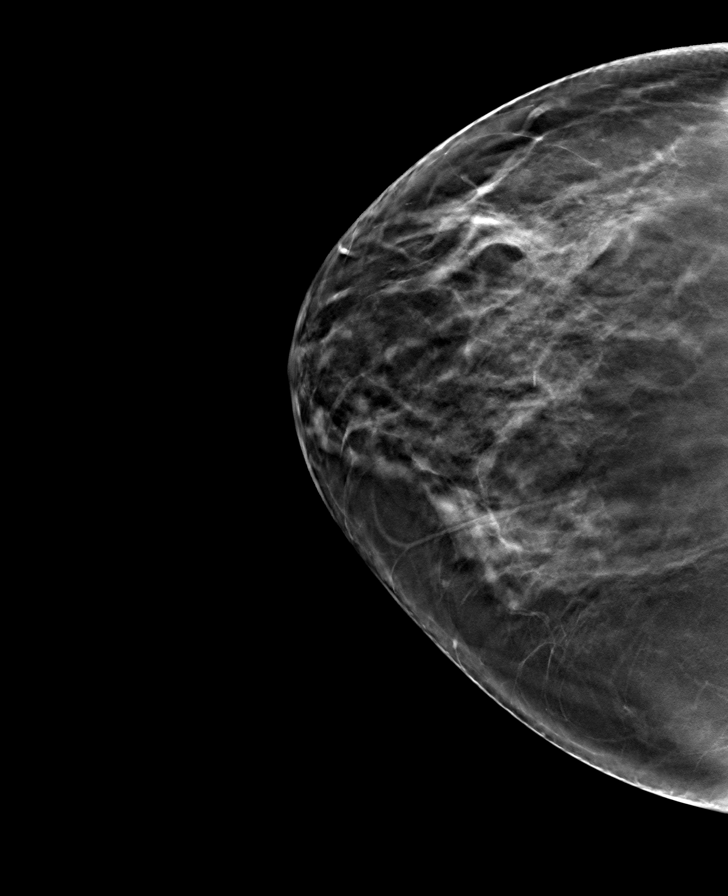

[R MLO tomo · tomo slice 45/88.0]
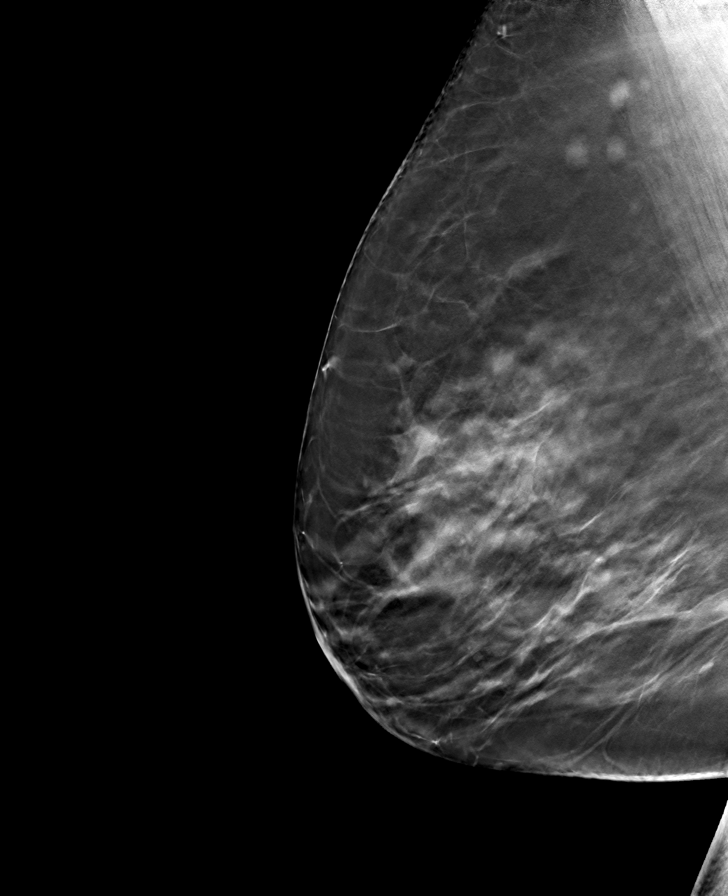

[L MLO tomo · tomo slice 47/92.0]
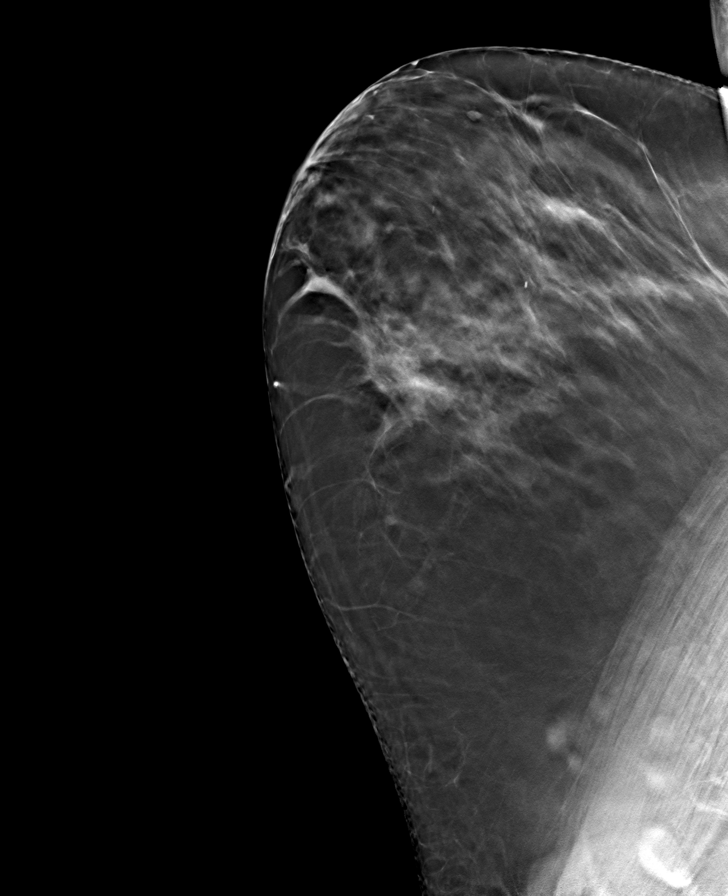

[R CC tomo · tomo slice 43/84.0]
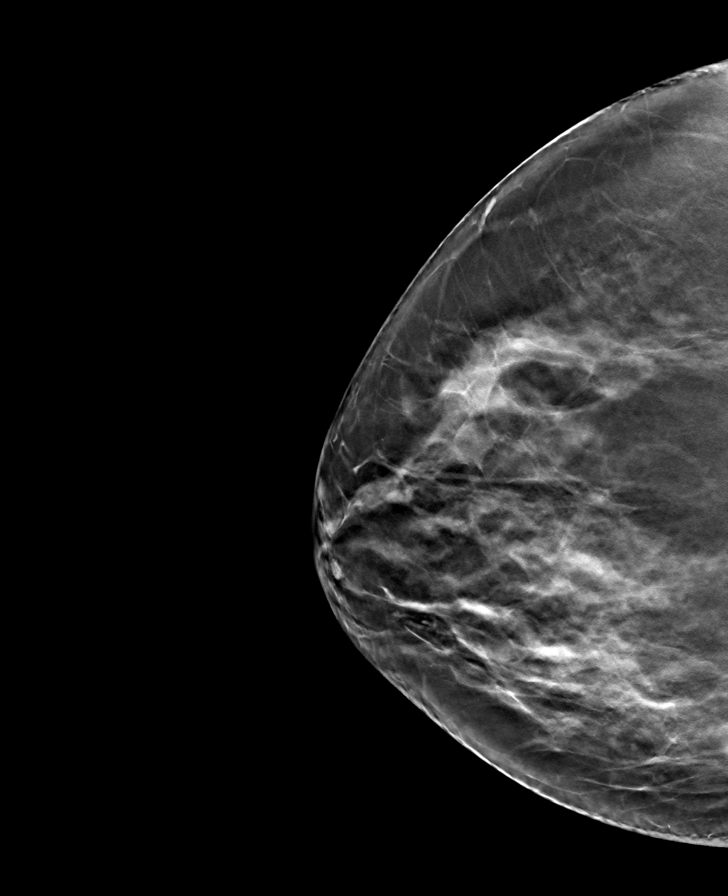

[8 of 24 positions shown; findings below may reference images not displayed]

ACR Breast Density Category c: The breast tissue is heterogeneously
dense, which may obscure small masses.
FINDINGS: There are no findings suspicious for malignancy. Images were
processed with CAD.
IMPRESSION: No mammographic evidence of malignancy. A result letter of this
screening mammogram will be mailed directly to the patient.

RECOMMENDATION:
Screening mammogram in one year. (Code:[5V])

BI-RADS CATEGORY  1: Negative.

## 2018-10-29 ENCOUNTER — Other Ambulatory Visit: Payer: Self-pay

## 2018-10-29 ENCOUNTER — Ambulatory Visit
Admission: EM | Admit: 2018-10-29 | Discharge: 2018-10-29 | Disposition: A | Discharge status: Home / Self Care | Payer: Medicaid Other | Attending: Family Medicine | Admitting: Family Medicine

## 2018-10-29 DIAGNOSIS — E039 Hypothyroidism, unspecified: Secondary | ICD-10-CM | POA: Diagnosis present

## 2018-10-29 DIAGNOSIS — E1065 Type 1 diabetes mellitus with hyperglycemia: Secondary | ICD-10-CM | POA: Diagnosis not present

## 2018-10-29 DIAGNOSIS — R634 Abnormal weight loss: Secondary | ICD-10-CM | POA: Diagnosis present

## 2018-10-29 LAB — COMPREHENSIVE METABOLIC PANEL
ALT: 8 U/L (ref 0–44)
AST: 12 U/L — ABNORMAL LOW (ref 15–41)
Albumin: 4.2 g/dL (ref 3.5–5.0)
Alkaline Phosphatase: 52 U/L (ref 38–126)
Anion gap: 10 (ref 5–15)
BUN: 5 mg/dL — ABNORMAL LOW (ref 6–20)
CO2: 23 mmol/L (ref 22–32)
Calcium: 9.4 mg/dL (ref 8.9–10.3)
Chloride: 99 mmol/L (ref 98–111)
Creatinine, Ser: 0.69 mg/dL (ref 0.44–1.00)
GFR calc Af Amer: >60 mL/min (ref >60)
GFR calc non Af Amer: >60 mL/min (ref >60)
Glucose, Bld: 315 mg/dL — ABNORMAL HIGH (ref 70–99)
Potassium: 3.3 mmol/L — ABNORMAL LOW (ref 3.5–5.1)
Sodium: 132 mmol/L — ABNORMAL LOW (ref 135–145)
Total Bilirubin: 1.5 mg/dL — ABNORMAL HIGH (ref 0.3–1.2)
Total Protein: 7.7 g/dL (ref 6.5–8.1)

## 2018-10-29 LAB — CBC WITH DIFFERENTIAL/PLATELET
Abs Immature Granulocytes: 0.04 10*3/uL (ref 0.00–0.07)
Basophils Absolute: 0.1 10*3/uL (ref 0.0–0.1)
Basophils Relative: 1 %
Eosinophils Absolute: 0 10*3/uL (ref 0.0–0.5)
Eosinophils Relative: 0 %
HCT: 39.8 % (ref 36.0–46.0)
Hemoglobin: 13.8 g/dL (ref 12.0–15.0)
Immature Granulocytes: 1 %
Lymphocytes Relative: 30 %
Lymphs Abs: 2.2 10*3/uL (ref 0.7–4.0)
MCH: 29.7 pg (ref 26.0–34.0)
MCHC: 34.7 g/dL (ref 30.0–36.0)
MCV: 85.8 fL (ref 80.0–100.0)
Monocytes Absolute: 0.5 10*3/uL (ref 0.1–1.0)
Monocytes Relative: 7 %
Neutro Abs: 4.4 10*3/uL (ref 1.7–7.7)
Neutrophils Relative %: 61 %
Platelets: 254 10*3/uL (ref 150–400)
RBC: 4.64 MIL/uL (ref 3.87–5.11)
RDW: 13.1 % (ref 11.5–15.5)
WBC: 7.2 10*3/uL (ref 4.0–10.5)
nRBC: 0 % (ref 0.0–0.2)

## 2018-10-29 LAB — LIPASE, BLOOD: Lipase: 20 U/L (ref 11–51)

## 2018-10-29 LAB — GLUCOSE, CAPILLARY: Glucose-Capillary: 296 mg/dL — ABNORMAL HIGH (ref 70–99)

## 2018-10-29 MED ORDER — POLYETHYLENE GLYCOL 3350 17 GM/SCOOP PO POWD: ORAL | 0 refills | Status: DC

## 2018-10-29 NOTE — ED Provider Notes (Signed)
[facility]    CSN: [id number] Arrival date & time: 10/29/18  1732      History   Chief Complaint Chief Complaint  Patient presents with  . Constipation   HPI  58 year old female with uncontrolled diabetes presents with constipation, decrease appetite, and ongoing weight loss.  Patient reports ongoing unintentional weight loss for the past few months. Review of the EMR shows a 21 lb weight loss since 4/7 (168 lbs to 147 lbs). She reports a 2 week history of lack of appetite and constipation. Has not had a BM for ~ 1 week. Does not feel well. No fever. No abdominal pain. No relieving factors. No other associated symptoms. No other complaints.  History reviewed and updated as below.  Past Medical History:  Diagnosis Date  . Diabetes mellitus without complication ([place])   . Hyperlipidemia   . Hypothyroidism   . Thyroid disease   Cataract  Patient Active Problem List   Diagnosis Date Noted  . Back pain 06/14/2017  . Fibroids 06/14/2017  . 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 Hypertension associated with diabetes ([place]) 06/14/2017  . Hypothyroid 06/14/2017  . Menometrorrhagia 06/14/2017  . Abscess of right groin 06/14/2017  . Ovarian failure 09/22/2016  . Visit for screening mammogram 07/20/2016  . Colon polyps 10/29/2014  . Left foot pain 04/15/2014  . Abnormal uterine bleeding 02/24/2014  . Cervical polyp 10/16/2013  . High risk medication use 10/16/2013  . Hyperlipidemia 10/16/2013  . Increased frequency of urination 10/16/2013  . Diabetes mellitus type 1 ([place]) 01/06/2013  . Diabetic keto-acidosis ([place]) 01/01/2013  . Graves disease 01/01/2013  . Hyperglycemia 01/01/2013  . Urine ketones 01/01/2013  . Annual physical exam 08/15/2012  . Pigmented skin lesions 08/15/2012    Past Surgical History:  Procedure Laterality Date  . INCISION AND DRAINAGE ABSCESS Right 06/15/2017   Procedure: INCISION AND DRAINAGE ABSCESS;  Surgeon: Clayburn Pert, MD;  Location:  [facility] ORS;  Service: General;  Laterality: Right;    OB History   No obstetric history on file.      Home Medications    Prior to Admission medications   Medication Sig Start Date End Date Taking? Authorizing Provider  aspirin EC 81 MG tablet Take 81 mg by mouth daily.   Yes [provider]  Cholecalciferol 2000 units TABS Take 2,000 Units by mouth 2 (two) times daily.   Yes [provider]  ibuprofen (ADVIL,MOTRIN) 600 MG tablet Take 1 tablet (600 mg total) by mouth every 8 (eight) hours as needed. Patient taking differently: Take 600 mg by mouth every 8 (eight) hours as needed for mild pain or moderate pain.  06/11/17  Yes Sable Feil, PA-C  insulin NPH-regular Human (NOVOLIN 70/30) (70-30) 100 UNIT/ML injection Inject 34 Units into the skin 2 (two) times daily before a meal.   Yes [provider]  levothyroxine (SYNTHROID, LEVOTHROID) 88 MCG tablet Take 88 mcg by mouth daily before breakfast.   Yes [provider]  lisinopril (PRINIVIL,ZESTRIL) 20 MG tablet Take 20 mg by mouth daily.   Yes [provider]  oxyCODONE-acetaminophen (PERCOCET) 7.5-325 MG tablet Take 1 tablet by mouth every 6 (six) hours as needed for severe pain. 06/16/17  Yes Clayburn Pert, MD  pravastatin (PRAVACHOL) 40 MG tablet Take 40 mg by mouth every evening.    Yes [provider]  senna-docusate (SENOKOT-S) 8.6-50 MG tablet Take 1 tablet by mouth daily.   Yes [provider]  polyethylene glycol powder (GLYCOLAX/MIRALAX)  17 GM/SCOOP powder 17 g once or twice daily for constipation. 10/29/18   Coral Spikes, DO    Family History Family History  Problem Relation Age of Onset  . Heart disease Father   . Breast cancer Neg Hx     Social History Social History   Tobacco Use  . Smoking status: Never Smoker  . Smokeless tobacco: Never Used  Substance Use Topics  . Alcohol use: No    Frequency: Never  . Drug use: No     Allergies   Latex    Review of Systems Review of Systems  Constitutional: Positive for appetite change and unexpected weight change.  Gastrointestinal: Positive for constipation.   Physical Exam Triage Vital Signs ED Triage Vitals  Enc Vitals Group     BP 10/29/18 1752 132/88     Pulse Rate 10/29/18 1752 (!) 108     Resp 10/29/18 1752 18     Temp 10/29/18 1752 98.6 F (37 C)     Temp Source 10/29/18 1752 Oral     SpO2 10/29/18 1752 100 %     Weight 10/29/18 1749 147 lb (66.7 kg)     Height 10/29/18 1749 5\' 1"  (1.549 m)     Head Circumference --      Peak Flow --      Pain Score 10/29/18 1749 0     Pain Loc --      Pain Edu? --      Excl. in [place]? --    No data found.  Updated Vital Signs BP 132/88 (BP Location: Left Arm)   Pulse (!) 108   Temp 98.6 F (37 C) (Oral)   Resp 18   Ht 5\' 1"  (1.549 m)   Wt 66.7 kg   SpO2 100%   BMI 27.78 kg/m   Visual Acuity Right Eye Distance:   Left Eye Distance:   Bilateral Distance:    Right Eye Near:   Left Eye Near:    Bilateral Near:     Physical Exam Vitals signs and nursing note reviewed.  Constitutional:      General: She is not in acute distress.    Comments: Appears fatigued.  HENT:     Head: Normocephalic and atraumatic.     Mouth/Throat:     Mouth: Mucous membranes are moist.     Pharynx: Oropharynx is clear.  Eyes:     General: No scleral icterus.    Conjunctiva/sclera: Conjunctivae normal.  Cardiovascular:     Rate and Rhythm: Regular rhythm. Tachycardia present.  Pulmonary:     Effort: Pulmonary effort is normal.     Breath sounds: Normal breath sounds. No wheezing, rhonchi or rales.  Abdominal:     General: There is no distension.     Palpations: Abdomen is soft.     Tenderness: There is no abdominal tenderness.  Neurological:     Mental Status: She is alert.  Psychiatric:     Comments: Flat affect. Depressed mood.     UC Treatments / Results  Labs (all labs ordered are listed, but only abnormal results are  displayed) Labs Reviewed  GLUCOSE, CAPILLARY - Abnormal; Notable for the following components:      Result Value   Glucose-Capillary 296 (*)    All other components within normal limits  COMPREHENSIVE METABOLIC PANEL - Abnormal; Notable for the following components:   Sodium 132 (*)    Potassium 3.3 (*)    Glucose, Bld 315 (*)  BUN 5 (*)    AST 12 (*)    Total Bilirubin 1.5 (*)    All other components within normal limits  CBC WITH DIFFERENTIAL/PLATELET  LIPASE, BLOOD  CBG MONITORING, ED    EKG   Radiology No results found.  Procedures Procedures (including critical care time)  Medications Ordered in UC Medications - No data to display  Initial Impression / Assessment and Plan / UC Course  I have reviewed the triage vital signs and the nursing notes.  Pertinent labs & imaging results that were available during my care of the patient were reviewed by me and considered in my medical decision making (see chart for details).    58 year old female presents with unintentional weight loss, constipation, decrease in appetite. Labs notable for mild hypokalemia, hyperglycemia, slightly elevated bilirubin. Etiology remains unclear. Weight loss is worrisome but diabetes remains very uncontrolled as is hypothyroidism. Cancer screenings are up to date according to the EMR. Miralax as needed for constipation. Advised close follow up with PCP and Endo.   Final Clinical Impressions(s) / UC Diagnoses   Final diagnoses:  Unintentional weight loss  Uncontrolled type 1 diabetes mellitus with hyperglycemia (HCC)  Hypothyroidism, unspecified type     Discharge Instructions     You labs were essentially stable.  Your weight loss concerns me.   You need close follow up with your PCP. We need to make sure your screenings are up to date and we need to get your hypothyroidism and diabetes under control.  Take care  Dr. Lacinda Axon    ED Prescriptions    Medication Sig Dispense Auth.  Provider   polyethylene glycol powder (GLYCOLAX/MIRALAX) 17 GM/SCOOP powder 17 g once or twice daily for constipation. 500 g Coral Spikes, DO     Controlled Substance Prescriptions [place] Controlled Substance Registry consulted? Not Applicable   Coral Spikes, DO 10/29/18 2209

## 2018-10-29 NOTE — Discharge Instructions (Signed)
You labs were essentially stable.  Your weight loss concerns me.   You need close follow up with your PCP. We need to make sure your screenings are up to date and we need to get your hypothyroidism and diabetes under control.  Take care  Dr. Lacinda Axon

## 2018-10-29 NOTE — ED Triage Notes (Signed)
Patient complains of constipation and loss of appetite that started 2 weeks ago. Patient states that she has not had a BM in 1 week. Patient states that she is not having pain but just feels off.

## 2018-12-13 ENCOUNTER — Other Ambulatory Visit: Payer: Self-pay | Admitting: Internal Medicine

## 2018-12-13 DIAGNOSIS — Z1231 Encounter for screening mammogram for malignant neoplasm of breast: Secondary | ICD-10-CM

## 2018-12-16 ENCOUNTER — Ambulatory Visit
Admission: RE | Admit: 2018-12-16 | Discharge: 2018-12-16 | Disposition: A | Discharge status: Home / Self Care | Payer: Medicaid Other | Source: Ambulatory Visit | Attending: Internal Medicine | Admitting: Internal Medicine

## 2018-12-16 ENCOUNTER — Emergency Department
Admission: EM | Admit: 2018-12-16 | Discharge: 2018-12-16 | Disposition: A | Discharge status: Home / Self Care | Payer: Medicaid Other | Attending: Emergency Medicine | Admitting: Emergency Medicine

## 2018-12-16 ENCOUNTER — Other Ambulatory Visit: Payer: Self-pay

## 2018-12-16 ENCOUNTER — Emergency Department: Payer: Medicaid Other

## 2018-12-16 ENCOUNTER — Encounter: Payer: Self-pay | Admitting: Emergency Medicine

## 2018-12-16 DIAGNOSIS — Z1231 Encounter for screening mammogram for malignant neoplasm of breast: Secondary | ICD-10-CM | POA: Diagnosis present

## 2018-12-16 DIAGNOSIS — Z7982 Long term (current) use of aspirin: Secondary | ICD-10-CM | POA: Insufficient documentation

## 2018-12-16 DIAGNOSIS — K5900 Constipation, unspecified: Secondary | ICD-10-CM | POA: Diagnosis not present

## 2018-12-16 DIAGNOSIS — Z79899 Other long term (current) drug therapy: Secondary | ICD-10-CM | POA: Insufficient documentation

## 2018-12-16 DIAGNOSIS — Z794 Long term (current) use of insulin: Secondary | ICD-10-CM | POA: Diagnosis not present

## 2018-12-16 DIAGNOSIS — E039 Hypothyroidism, unspecified: Secondary | ICD-10-CM | POA: Diagnosis not present

## 2018-12-16 DIAGNOSIS — I10 Essential (primary) hypertension: Secondary | ICD-10-CM | POA: Insufficient documentation

## 2018-12-16 DIAGNOSIS — E109 Type 1 diabetes mellitus without complications: Secondary | ICD-10-CM | POA: Diagnosis not present

## 2018-12-16 IMAGING — MG DIGITAL SCREENING BILATERAL MAMMOGRAM WITH TOMO AND CAD
8 series · 8 of 24 positions shown · non-contrast
Comparison: Previous exam(s).

CLINICAL DATA: Screening.

EXAM:
DIGITAL SCREENING BILATERAL MAMMOGRAM WITH TOMO AND CAD

[R MLO synth-2D]
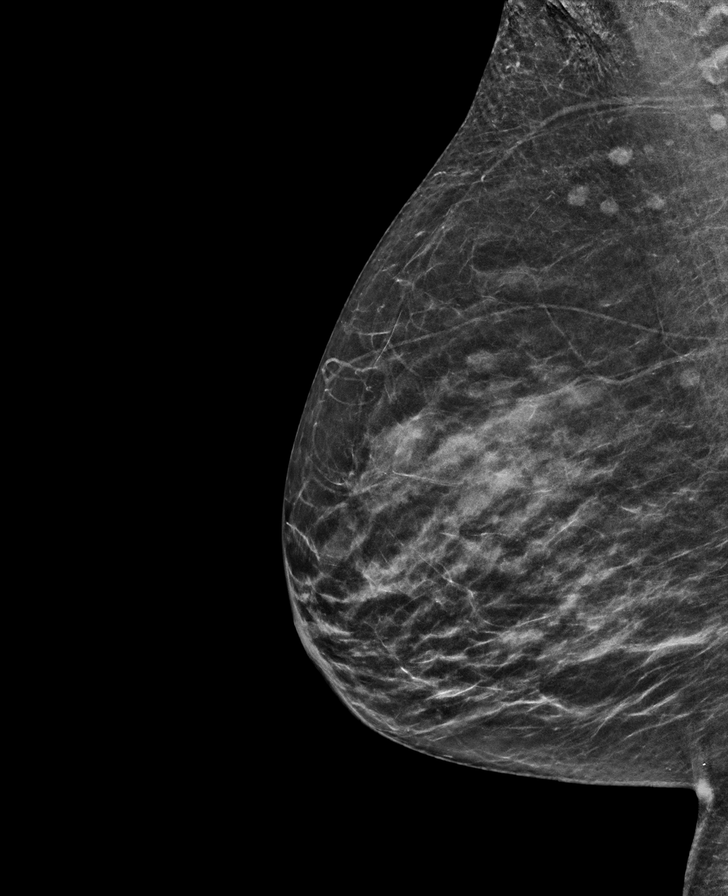

[L CC synth-2D]
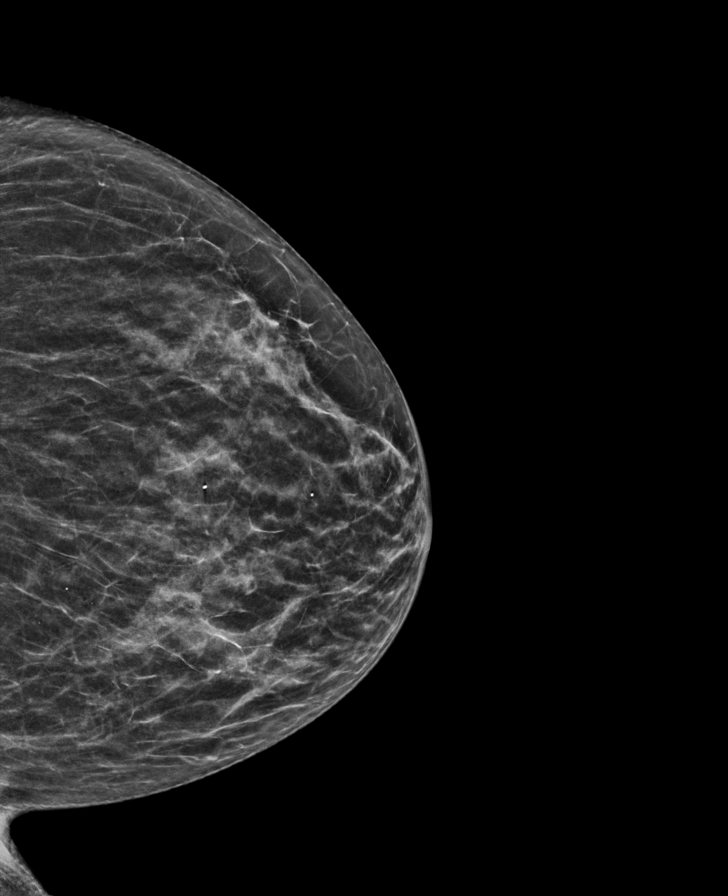

[R CC synth-2D]
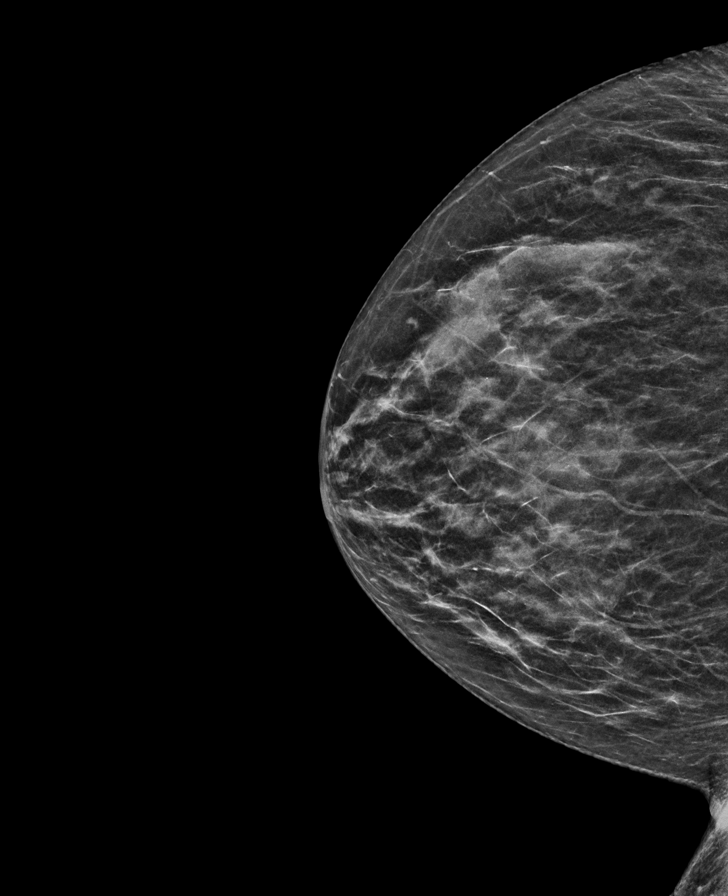

[L MLO synth-2D]
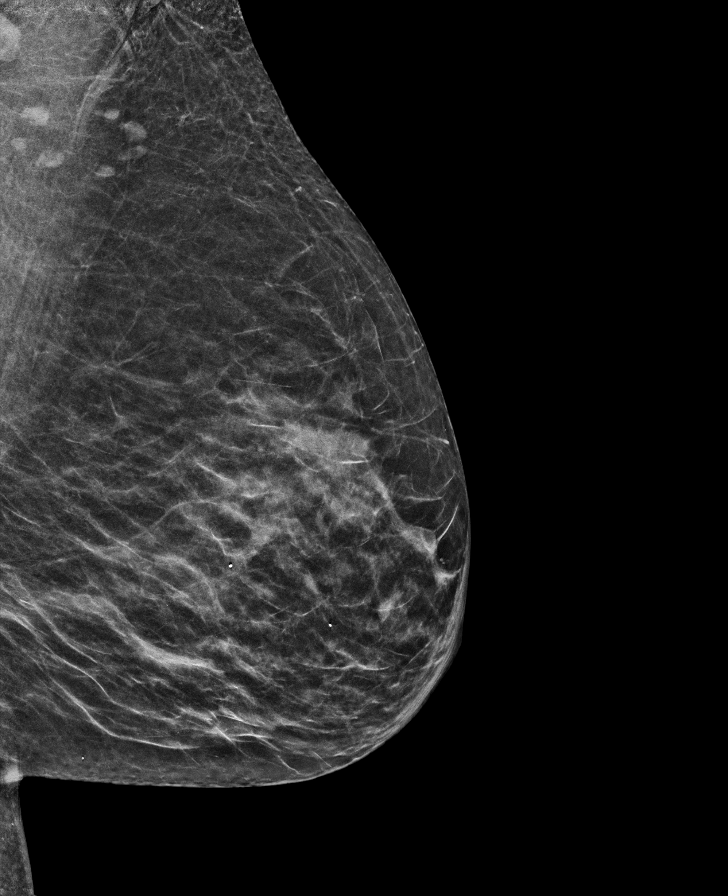

[L CC tomo · tomo slice 25/48.0]
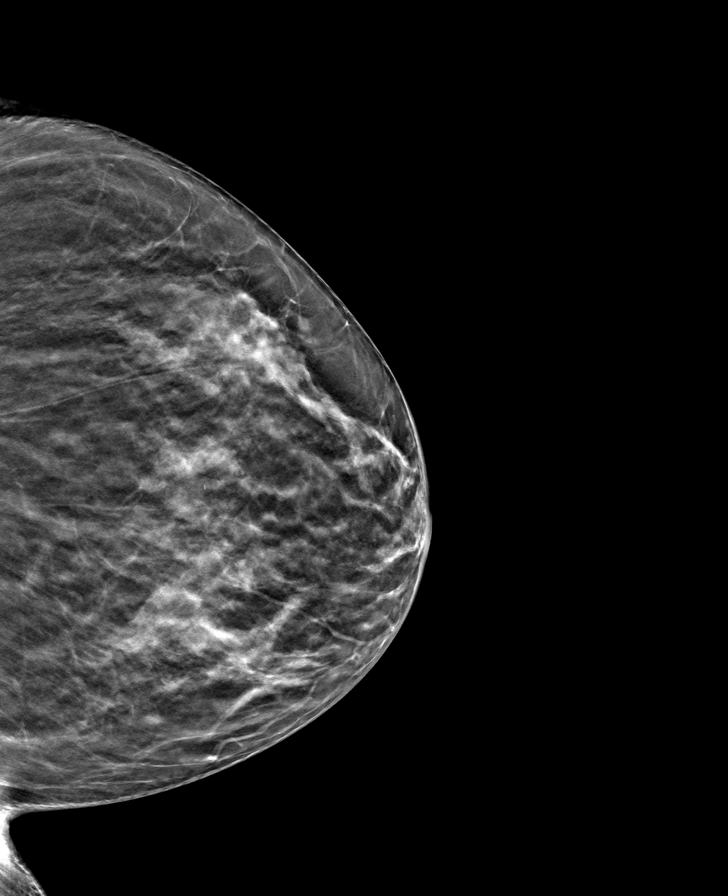

[R CC tomo · tomo slice 25/48.0]
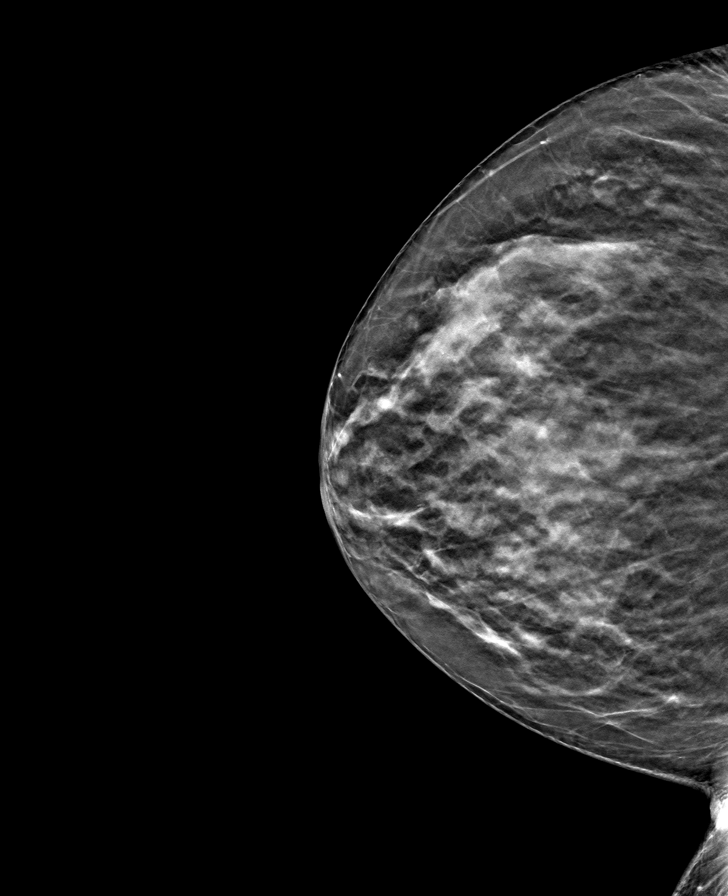

[L MLO tomo · tomo slice 27/52.0]
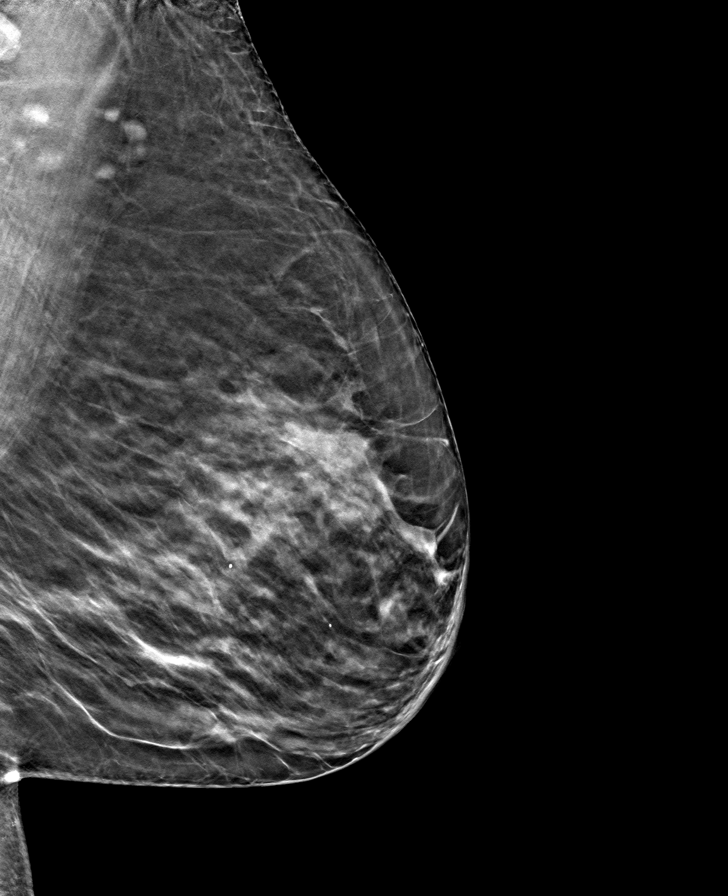

[R MLO tomo · tomo slice 26/51.0]
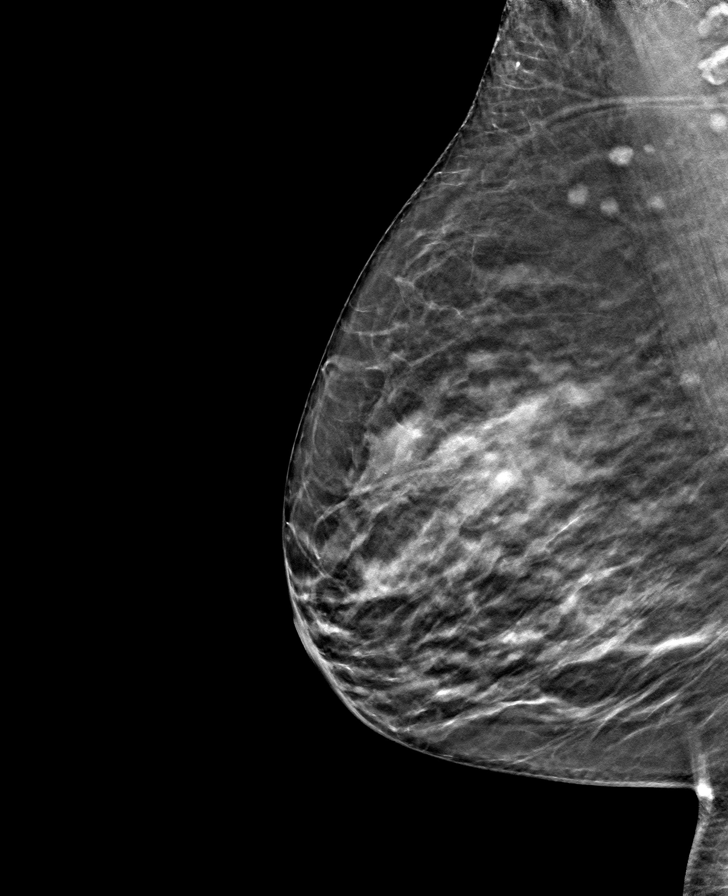

[8 of 24 positions shown; findings below may reference images not displayed]

ACR Breast Density Category c: The breast tissue is heterogeneously
dense, which may obscure small masses.
FINDINGS: There are no findings suspicious for malignancy. Images were
processed with CAD.
IMPRESSION: No mammographic evidence of malignancy. A result letter of this
screening mammogram will be mailed directly to the patient.

RECOMMENDATION:
Screening mammogram in one year. (Code:[5V])

BI-RADS CATEGORY  1: Negative.

## 2018-12-16 IMAGING — DX ABDOMEN - 1 VIEW
1 series · 1 of 1 positions shown · non-contrast
Comparison: None.

CLINICAL DATA: Constipation for 2 weeks

EXAM:
ABDOMEN - 1 VIEW

[abdomen supine]
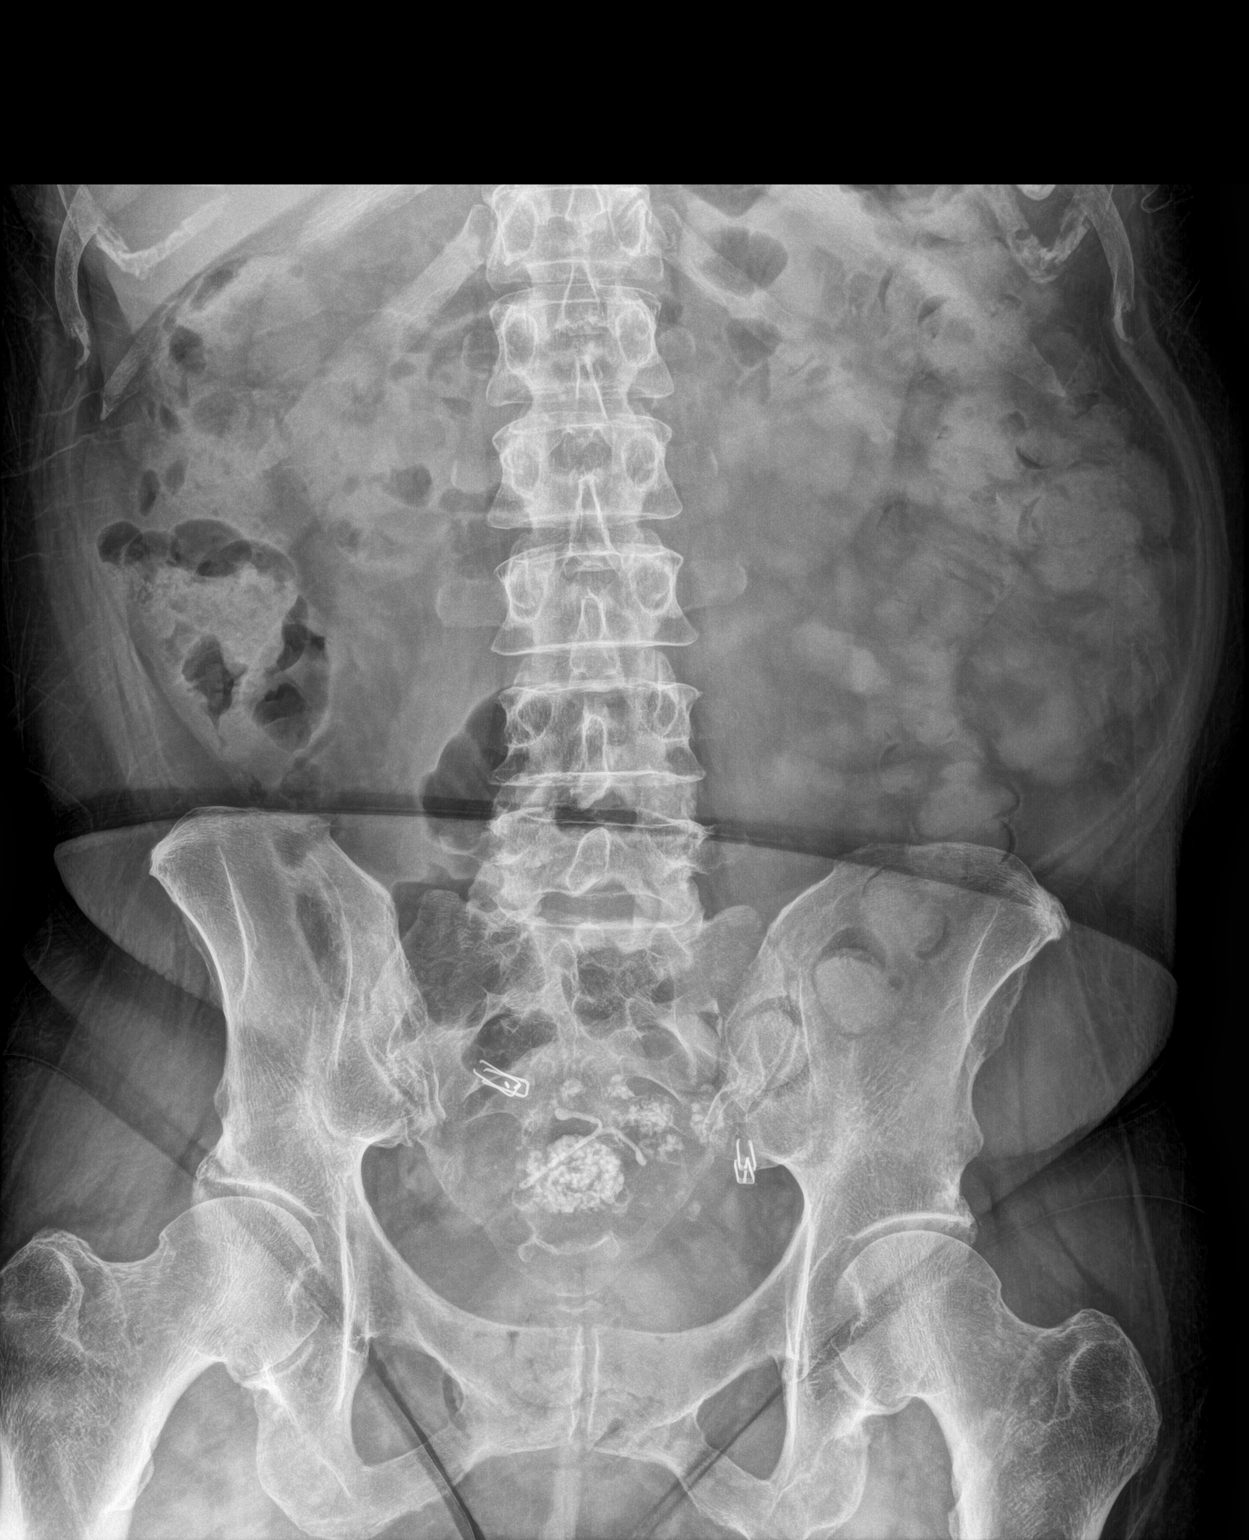

[1 of 1 positions shown; findings below may reference images not displayed]

FINDINGS: Scattered large and small bowel gas is noted. Fecal material is
noted throughout the colon consistent with mild constipation.
Calcified uterine fibroids are noted. An IUD is noted in place.
Tubal ligation clips are noted. No bony abnormality is seen.
IMPRESSION: Mild constipation.

## 2018-12-16 MED ORDER — MAGNESIUM CITRATE PO SOLN
1.0000 | Freq: Once | ORAL | Status: AC
  Administered 2018-12-16: 1
  Filled 2018-12-16: qty 296

## 2018-12-16 MED ORDER — DOCUSATE SODIUM 50 MG/5ML PO LIQD
50.0000 mg | Freq: Once | ORAL | Status: AC
  Administered 2018-12-16: 18:00:00 50 mg
  Filled 2018-12-16: qty 10

## 2018-12-16 MED ORDER — SENNOSIDES-DOCUSATE SODIUM 8.6-50 MG PO TABS: 1.0000 | ORAL_TABLET | Freq: Every day | ORAL | 0 refills | Status: DC

## 2018-12-16 MED ORDER — MAGNESIUM CITRATE PO SOLN: 1.0000 | Freq: Once | ORAL | 0 refills | Status: AC

## 2018-12-16 NOTE — ED Provider Notes (Signed)
[facility] Emergency Department Provider Note  ____________________________________________  Time seen: Approximately 3:22 PM  I have reviewed the triage vital signs and the nursing notes.   HISTORY  Chief Complaint Constipation    HPI Andrea [name] is a 58 y.o. female who presents the emergency department complaining of constipation for approximately 2 weeks.  Patient reports that she has not had  a normal bowel movement in 2 weeks.  Patient denies any pain.  She denies any nausea or vomiting.  No history of constipation in the past.  Patient reports that she has taken multiple over-the-counter medications without success of alleviating her constipation.  Patient reports that she feels like "I have to go."  However, patient is unable to express any stool.        Past Medical History:  Diagnosis Date  . Diabetes mellitus without complication ([place])   . Hyperlipidemia   . Hypothyroidism   . Thyroid disease     Patient Active Problem List   Diagnosis Date Noted  . Back pain 06/14/2017  . Fibroids 06/14/2017  . 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 Hypertension associated with diabetes ([place]) 06/14/2017  . Hypothyroid 06/14/2017  . Menometrorrhagia 06/14/2017  . Abscess of right groin 06/14/2017  . Ovarian failure 09/22/2016  . Visit for screening mammogram 07/20/2016  . Colon polyps 10/29/2014  . Left foot pain 04/15/2014  . Abnormal uterine bleeding 02/24/2014  . Cervical polyp 10/16/2013  . High risk medication use 10/16/2013  . Hyperlipidemia 10/16/2013  . Increased frequency of urination 10/16/2013  . Diabetes mellitus type 1 ([place]) 01/06/2013  . Diabetic keto-acidosis ([place]) 01/01/2013  . Graves disease 01/01/2013  . Hyperglycemia 01/01/2013  . Urine ketones 01/01/2013  . Annual physical exam 08/15/2012  . Pigmented skin lesions 08/15/2012    Past Surgical History:  Procedure Laterality Date  . INCISION AND DRAINAGE ABSCESS Right 06/15/2017   Procedure: INCISION AND DRAINAGE ABSCESS;  Surgeon: Clayburn Pert, MD;  Location: [facility] ORS;  Service: General;  Laterality: Right;    Prior to Admission medications   Medication Sig Start Date End Date Taking? Authorizing Provider  aspirin EC 81 MG tablet Take 81 mg by mouth daily.    [provider]  Cholecalciferol 2000 units TABS Take 2,000 Units by mouth 2 (two) times daily.    [provider]  ibuprofen (ADVIL,MOTRIN) 600 MG tablet Take 1 tablet (600 mg total) by mouth every 8 (eight) hours as needed. Patient taking differently: Take 600 mg by mouth every 8 (eight) hours as needed for mild pain or moderate pain.  06/11/17   Sable Feil, PA-C  insulin NPH-regular Human (NOVOLIN 70/30) (70-30) 100 UNIT/ML injection Inject 34 Units into the skin 2 (two) times daily before a meal.    [provider]  levothyroxine (SYNTHROID, LEVOTHROID) 88 MCG tablet Take 88 mcg by mouth daily before breakfast.    [provider]  lisinopril (PRINIVIL,ZESTRIL) 20 MG tablet Take 20 mg by mouth daily.    [provider]  magnesium citrate SOLN Take 296 mLs (1 Bottle total) by mouth once for 1 dose. 12/16/18 12/16/18  , Charline Bills, PA-C  polyethylene glycol powder (GLYCOLAX/MIRALAX) 17 GM/SCOOP powder 17 g once or twice daily for constipation. 10/29/18   Coral Spikes, DO  pravastatin (PRAVACHOL) 40 MG tablet Take 40 mg by mouth every evening.     [provider]  senna-docusate (SENOKOT-S) 8.6-50 MG tablet Take 1 tablet by mouth daily.    [provider]  senna-docusate (SENOKOT-S) 8.6-50 MG tablet Take 1 tablet by mouth daily. 12/16/18   , Charline Bills, PA-C    Allergies Latex  Family History  Problem Relation Age of Onset  . Heart disease Father   . Breast cancer Neg Hx     Social History Social History   Tobacco Use  . Smoking status: Never Smoker  . Smokeless tobacco: Never Used  Substance Use Topics  . Alcohol  use: No    Frequency: Never  . Drug use: No     Review of Systems  Constitutional: No fever/chills Eyes: No visual changes. No discharge ENT: No upper respiratory complaints. Cardiovascular: no chest pain. Respiratory: no cough. No SOB. Gastrointestinal: No abdominal pain.  No nausea, no vomiting.  No diarrhea.  Positive constipation. Genitourinary: Negative for dysuria. No hematuria Musculoskeletal: Negative for musculoskeletal pain. Skin: Negative for rash, abrasions, lacerations, ecchymosis. Neurological: Negative for headaches, focal weakness or numbness. 10-point ROS otherwise negative.  ____________________________________________   PHYSICAL EXAM:  VITAL SIGNS: ED Triage Vitals  Enc Vitals Group     BP 12/16/18 1437 107/71     Pulse Rate 12/16/18 1437 (!) 126     Resp --      Temp 12/16/18 1437 98.6 F (37 C)     Temp Source 12/16/18 1437 Oral     SpO2 12/16/18 1437 98 %     Weight 12/16/18 1437 147 lb (66.7 kg)     Height 12/16/18 1437 5\' 1"  (1.549 m)     Head Circumference --      Peak Flow --      Pain Score 12/16/18 1447 4     Pain Loc --      Pain Edu? --      Excl. in [place]? --      Constitutional: Alert and oriented. Well appearing and in no acute distress. Eyes: Conjunctivae are normal. PERRL. EOMI. Head: Atraumatic. Neck: No stridor.    Cardiovascular: Normal rate, regular rhythm. Normal S1 and S2.  Good peripheral circulation. Respiratory: Normal respiratory effort without tachypnea or retractions. Lungs CTAB. Good air entry to the bases with no decreased or absent breath sounds. Gastrointestinal: Decreased bowel sounds in the left upper and lower quadrant, bowel sounds present right side.  Soft and nontender to palpation all quadrants. No guarding or rigidity. No palpable masses. No distention. No CVA tenderness. Musculoskeletal: Full range of motion to all extremities. No gross deformities appreciated. Neurologic:  Normal speech and language. No  gross focal neurologic deficits are appreciated.  Skin:  Skin is warm, dry and intact. No rash noted. Psychiatric: Mood and affect are normal. Speech and behavior are normal. Patient exhibits appropriate insight and judgement.   ____________________________________________   LABS (all labs ordered are listed, but only abnormal results are displayed)  Labs Reviewed - No data to display ____________________________________________  EKG   ____________________________________________  RADIOLOGY I personally viewed and evaluated these images as part of my medical decision making, as well as reviewing the written report by the radiologist.  I concur with radiologist finding of increased stool burden but no evidence of obstruction   ____________________________________________    PROCEDURES  Procedure(s) performed:    Procedures    Medications  docusate (COLACE) 50 MG/5ML liquid 50 mg (50 mg Per Tube Given 12/16/18 1734)  magnesium citrate solution 1 Bottle (1 Bottle Per Tube Given 12/16/18 1734)     ____________________________________________   INITIAL IMPRESSION / ASSESSMENT AND PLAN / ED COURSE  Pertinent  labs & imaging results that were available during my care of the patient were reviewed by me and considered in my medical decision making (see chart for details).  Review of the [place] CSRS was performed in accordance of the [place] prior to dispensing any controlled drugs.           Patient's diagnosis is consistent with constipation.  Patient presented to the emergency department with constipation x2 weeks.  Patient has not had a bowel movement in this period.  Patient denies any chronic GI complaints.  No history of constipation.  Multiple over-the-counter medications without relief.  X-ray reveals findings consistent with increased tumor burden.  Given inability to defecate in the past 2 weeks, patient will be given an enema here, started on Senokot and magnesium  citrate at home.  Follow-up with primary care as needed. Patient is given ED precautions to return to the ED for any worsening or new symptoms.     ____________________________________________  FINAL CLINICAL IMPRESSION(S) / ED DIAGNOSES  Final diagnoses:  Constipation, unspecified constipation type      NEW MEDICATIONS STARTED DURING THIS VISIT:  ED Discharge Orders         Ordered    senna-docusate (SENOKOT-S) 8.6-50 MG tablet  Daily     12/16/18 1655    magnesium citrate SOLN   Once    Note to Pharmacy: 10 bottles   12/16/18 1655              This chart was dictated using voice recognition software/Dragon. Despite best efforts to proofread, errors can occur which can change the meaning. Any change was purely unintentional.    Darletta Moll, PA-C 12/16/18 1801    Arta Silence, MD 12/16/18 [phone]

## 2018-12-16 NOTE — ED Triage Notes (Signed)
Pt presents to ED via POV with c/o constipation x 2 weeks. Pt states has tried OTC medications and enemas at home without relief. PT states feels like she has the sensation of needing to have BM but is unable to.

## 2018-12-16 NOTE — ED Notes (Signed)
See triage note  Presents with constipation  States sx's started couple of weeks ago  Has tried OTC meds w/o relief   No n/v

## 2019-08-16 ENCOUNTER — Encounter: Payer: Self-pay | Admitting: Emergency Medicine

## 2019-08-16 ENCOUNTER — Ambulatory Visit
Admission: EM | Admit: 2019-08-16 | Discharge: 2019-08-16 | Disposition: A | Discharge status: Home / Self Care | Payer: Medicaid Other

## 2019-08-16 ENCOUNTER — Other Ambulatory Visit: Payer: Self-pay

## 2019-08-16 DIAGNOSIS — H6121 Impacted cerumen, right ear: Secondary | ICD-10-CM

## 2019-08-16 DIAGNOSIS — H60502 Unspecified acute noninfective otitis externa, left ear: Secondary | ICD-10-CM

## 2019-08-16 DIAGNOSIS — H6122 Impacted cerumen, left ear: Secondary | ICD-10-CM

## 2019-08-16 DIAGNOSIS — H60501 Unspecified acute noninfective otitis externa, right ear: Secondary | ICD-10-CM

## 2019-08-16 DIAGNOSIS — H9201 Otalgia, right ear: Secondary | ICD-10-CM

## 2019-08-16 DIAGNOSIS — H9202 Otalgia, left ear: Secondary | ICD-10-CM

## 2019-08-16 MED ORDER — DOCUSATE SODIUM 50 MG/5ML PO LIQD
50.0000 mg | Freq: Once | ORAL | Status: AC
  Administered 2019-08-16: 50 mg via OTIC

## 2019-08-16 MED ORDER — NEOMYCIN-POLYMYXIN-HC 3.5-10000-1 OT SUSP: 4.0000 [drp] | Freq: Three times a day (TID) | OTIC | 0 refills | Status: DC

## 2019-08-16 NOTE — ED Provider Notes (Signed)
[place], [place]   Name: Andrea [name] DOB: February 13, 1961 MRN: [id number] CSN: [id number] PCP: [name], MD  Arrival date and time:  08/16/19 1510  Chief Complaint:  Otalgia (right)  NOTE: Prior to seeing the patient today, I have reviewed the triage nursing documentation and vital signs. Clinical staff has updated patient's PMH/PSHx, current medication list, and drug allergies/intolerances to ensure comprehensive history available to assist in medical decision making.   History:   HPI: Andrea [name] is a 59 y.o. female who presents today with complaints of pain in her RIGHT ear. Pain began with acute onset yesterday. She denies any associated fevers. Patient has not had any other recent upper respiratory symptoms; no cough, congestion, rhinorrhea, sneezing, or sore throat. She denies forceful nose blowing. Patient has not appreciated any otorrhea. She advises that her ability to hear from the RIGHT ear has acutely changed with the onset of the pain; describes hearing as being muffled. Patient denies history of recurrent ear infections. She has never had tympanostomy tubes in the past. Patient advising that she has not been swimming in the recent past. Patient denies the use of cotton tip swabs to clean her ears. Patient does not have a history of seasonal allergies. Despite her symptoms, patient has not taken any over the counter interventions to help improve/relieve her reported symptoms at home.   Past Medical History:  Diagnosis Date  . Diabetes mellitus without complication ([place])   . Hyperlipidemia   . Hypothyroidism   . Thyroid disease     Past Surgical History:  Procedure Laterality Date  . INCISION AND DRAINAGE ABSCESS Right 06/15/2017   Procedure: INCISION AND DRAINAGE ABSCESS;  Surgeon: Clayburn Pert, MD;  Location: [facility] ORS;  Service: General;  Laterality: Right;    Family History  Problem Relation Age of Onset  . Heart disease Father   . Breast cancer Neg Hx      Social History   Tobacco Use  . Smoking status: Never Smoker  . Smokeless tobacco: Never Used  Substance Use Topics  . Alcohol use: No  . Drug use: No    Patient Active Problem List   Diagnosis Date Noted  . Back pain 06/14/2017  . Fibroids 06/14/2017  . 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 Hypertension associated with diabetes ([place]) 06/14/2017  . Hypothyroid 06/14/2017  . Menometrorrhagia 06/14/2017  . Ovarian failure 09/22/2016  . Colon polyps 10/29/2014  . Left foot pain 04/15/2014  . Abnormal uterine bleeding 02/24/2014  . Cervical polyp 10/16/2013  . High risk medication use 10/16/2013  . Hyperlipidemia 10/16/2013  . Increased frequency of urination 10/16/2013  . Diabetes mellitus type 1 ([place]) 01/06/2013  . Diabetic keto-acidosis ([place]) 01/01/2013  . Graves disease 01/01/2013  . Hyperglycemia 01/01/2013  . Urine ketones 01/01/2013  . Annual physical exam 08/15/2012  . Pigmented skin lesions 08/15/2012    Home Medications:    Current Meds  Medication Sig  . aspirin EC 81 MG tablet Take 81 mg by mouth daily.  . Cholecalciferol 2000 units TABS Take 2,000 Units by mouth 2 (two) times daily.  . insulin NPH-regular Human (NOVOLIN 70/30) (70-30) 100 UNIT/ML injection Inject 34 Units into the skin 2 (two) times daily before a meal.  . levonorgestrel (MIRENA) 20 MCG/24HR IUD by Intrauterine route.  Marland Kitchen levothyroxine (SYNTHROID, LEVOTHROID) 88 MCG tablet Take 88 mcg by mouth daily before breakfast.  . lisinopril (PRINIVIL,ZESTRIL) 20 MG tablet Take 20 mg by mouth daily.  . rosuvastatin (CRESTOR) 20 MG tablet Take  by mouth.    Allergies:   Latex  Review of Systems (ROS):  Review of systems NEGATIVE unless otherwise noted in narrative H&P section.   Vital Signs: Today's Vitals   08/16/19 1521 08/16/19 1525 08/16/19 1621  BP:  120/83   Pulse:  (!) 106   Resp:  18   Temp:  97.7 F (36.5 C)   TempSrc:  Oral   SpO2:  100%   Weight: 147 lb 0.8 oz (66.7 kg)    Height:  5\' 1"  (1.549 m)    PainSc: 8   8     Physical Exam: Physical Exam  Constitutional: She is oriented to person, place, and time and well-developed, well-nourished, and in no distress.  HENT:  Head: Normocephalic and atraumatic.  Right Ear: There is tenderness.  Left Ear: Tympanic membrane normal.  RIGHT ear with EAC totally obscured by cerumen. Unable to visualize TM (pre-procedural).   Eyes: Pupils are equal, round, and reactive to light.  Cardiovascular: Regular rhythm, normal heart sounds and intact distal pulses. Tachycardia present.  Pulmonary/Chest: Effort normal and breath sounds normal.  Neurological: She is alert and oriented to person, place, and time. Gait normal.  Skin: Skin is warm and dry. No rash noted. She is not diaphoretic.  Psychiatric: Mood, memory, affect and judgment normal.  Nursing note and vitals reviewed.   Urgent Care Treatments / Results:   Orders Placed This Encounter  Procedures  . Ear wax removal  . ED EAR CERUMEN REMOVAL    LABS: PLEASE NOTE: all labs that were ordered this encounter are listed, however only abnormal results are displayed. Labs Reviewed - No data to display  EKG: -None  RADIOLOGY: No results found.  PROCEDURES: Ear Cerumen Removal  Date/Time: 08/16/2019 3:45 PM Performed by: Karen Kitchens, NP Authorized by: Karen Kitchens, NP   Consent:    Consent obtained:  Verbal   Consent given by:  Patient   Risks discussed:  Bleeding, infection, pain, dizziness, incomplete removal and TM perforation   Alternatives discussed:  Alternative treatment, observation and referral Procedure details:    Location:  R ear   Procedure type comment:  Docusate dwell. Warm water lavage followed by gentle disimpaction attempt with loop currette. Post-procedure details:    Inspection:  Bleeding, macerated skin and TM intact   Hearing quality:  Improved   Patient tolerance of procedure:  Tolerated well, no immediate  complications    MEDICATIONS RECEIVED THIS VISIT: Medications  docusate (COLACE) 50 MG/5ML liquid 50 mg (50 mg Right EAR Given 08/16/19 1555)    PERTINENT CLINICAL COURSE NOTES/UPDATES:   Initial Impression / Assessment and Plan / Urgent Care Course:  Pertinent labs & imaging results that were available during my care of the patient were personally reviewed by me and considered in my medical decision making (see lab/imaging section of note for values and interpretations).  Kjersten Lichterman is a 59 y.o. female who presents to [facility] today with complaints of Otalgia (right)  Patient is well appearing overall in clinic today. She does not appear to be in any acute distress. Presenting symptoms (see HPI) and exam as documented above. Symptoms have been present and bothering the patient since yesterday. Exam reveals RIGHT ear with EAC totally obscured by cerumen. Ears disimpacted as per above procedure note. Post procedural exam of the ear reveals minor bleeding and tissue maceration. Will cover for otitis externa using a 5 day course of Cortisporin otic gtts. Patient advised to expect for  minor discomfort and sanguinous drainage for the the few days. She was advised to keep her ears clean and dry. May use Tylenol and/or Ibuprofen as needed for discomfort. Discussed routine ear cleaning. Patient advised to avoid use of cotton tipped swabs to clear her ears. Reviewed periodic use of Debrox to prevent cerumen buildup.   Discussed follow up with primary care physician in 1 week for re-evaluation. I have reviewed the follow up and strict return precautions for any new or worsening symptoms. Patient is aware of symptoms that would be deemed urgent/emergent, and would thus require further evaluation either here or in the emergency department. At the time of discharge, she verbalized understanding and consent with the discharge plan as it was reviewed with her. All questions were fielded by provider  and/or clinic staff prior to patient discharge.    Final Clinical Impressions / Urgent Care Diagnoses:   Final diagnoses:  Impacted cerumen of right ear  Acute otitis externa of right ear, unspecified type  Right ear pain    New Prescriptions:  [place] Controlled Substance Registry consulted? Not Applicable  Meds ordered this encounter  Medications  . docusate (COLACE) 50 MG/5ML liquid 50 mg  . neomycin-polymyxin-hydrocortisone (CORTISPORIN) 3.5-10000-1 OTIC suspension    Sig: Place 4 drops into the left ear 3 (three) times daily. X 5 days    Dispense:  10 mL    Refill:  0    Recommended Follow up Care:  Patient encouraged to follow up with the following provider within the specified time frame, or sooner as dictated by the severity of her symptoms. As always, she was instructed that for any urgent/emergent care needs, she should seek care either here or in the emergency department for more immediate evaluation.  Follow-up Information    Bright, Aaron Mose, MD In 1 week.   Specialty: Internal Medicine Contact information: [address] [place] [id number] [phone]         NOTE: This note was prepared using Dragon dictation software along with smaller phrase technology. Despite my best ability to proofread, there is the potential that transcriptional errors may still occur from this process, and are completely unintentional.    Karen Kitchens, NP 08/18/19 [phone]

## 2019-08-16 NOTE — Discharge Instructions (Addendum)
It was very nice seeing you today in clinic. Thank you for entrusting me with your care.   Keep ears clean and dry. Use drops as prescribed. AVOID cotton tip swabs for ear cleaning May use Tylenol and/or Ibuprofen as needed for discomfort.   Make arrangements to follow up with your regular doctor in 1 week for re-evaluation if not improving. Please remember, our [place] providers are "right here with you" when you need [place].   Again, it was my pleasure to take care of you today. Thank you for choosing our clinic. I hope that you start to feel better quickly.   Honor Loh, MSN, APRN, FNP-C, CEN Advanced Practice Provider [facility]

## 2019-08-16 NOTE — ED Triage Notes (Signed)
Pt c/o right ear pain. Started yesterday. Denies fever.

## 2019-10-08 ENCOUNTER — Other Ambulatory Visit: Payer: Self-pay

## 2019-10-08 ENCOUNTER — Encounter: Payer: Self-pay | Admitting: Emergency Medicine

## 2019-10-08 ENCOUNTER — Emergency Department
Admission: EM | Admit: 2019-10-08 | Discharge: 2019-10-08 | Disposition: A | Discharge status: Home / Self Care | Payer: Medicaid Other | Attending: Emergency Medicine | Admitting: Emergency Medicine

## 2019-10-08 DIAGNOSIS — E1165 Type 2 diabetes mellitus with hyperglycemia: Secondary | ICD-10-CM | POA: Diagnosis not present

## 2019-10-08 DIAGNOSIS — E039 Hypothyroidism, unspecified: Secondary | ICD-10-CM | POA: Insufficient documentation

## 2019-10-08 DIAGNOSIS — R531 Weakness: Secondary | ICD-10-CM

## 2019-10-08 DIAGNOSIS — Z7984 Long term (current) use of oral hypoglycemic drugs: Secondary | ICD-10-CM | POA: Insufficient documentation

## 2019-10-08 DIAGNOSIS — Z91040 Latex allergy status: Secondary | ICD-10-CM | POA: Diagnosis not present

## 2019-10-08 DIAGNOSIS — E876 Hypokalemia: Secondary | ICD-10-CM | POA: Insufficient documentation

## 2019-10-08 DIAGNOSIS — R739 Hyperglycemia, unspecified: Secondary | ICD-10-CM

## 2019-10-08 LAB — URINALYSIS, COMPLETE (UACMP) WITH MICROSCOPIC
Bacteria, UA: NONE SEEN
Bilirubin Urine: NEGATIVE
Glucose, UA: >=500 mg/dL — AB
Hgb urine dipstick: NEGATIVE
Ketones, ur: NEGATIVE mg/dL
Leukocytes,Ua: NEGATIVE
Nitrite: NEGATIVE
Protein, ur: NEGATIVE mg/dL
Specific Gravity, Urine: 1.017 (ref 1.005–1.030)
Squamous Epithelial / LPF: NONE SEEN (ref 0–5)
pH: 6 (ref 5.0–8.0)

## 2019-10-08 LAB — BLOOD GAS, VENOUS
Acid-base deficit: 0.1 mmol/L (ref 0.0–2.0)
Bicarbonate: 25.9 mmol/L (ref 20.0–28.0)
O2 Saturation: 55.7 %
Patient temperature: 37
pCO2, Ven: 47 mmHg (ref 44.0–60.0)
pH, Ven: 7.35 (ref 7.250–7.430)
pO2, Ven: 31 mmHg — CL (ref 32.0–45.0)

## 2019-10-08 LAB — GLUCOSE, CAPILLARY
Glucose-Capillary: 459 mg/dL — ABNORMAL HIGH (ref 70–99)
Glucose-Capillary: 534 mg/dL (ref 70–99)
Glucose-Capillary: 396 mg/dL — ABNORMAL HIGH (ref 70–99)

## 2019-10-08 LAB — CBC
HCT: 32 % — ABNORMAL LOW (ref 36.0–46.0)
Hemoglobin: 11.3 g/dL — ABNORMAL LOW (ref 12.0–15.0)
MCH: 29 pg (ref 26.0–34.0)
MCHC: 35.3 g/dL (ref 30.0–36.0)
MCV: 82.1 fL (ref 80.0–100.0)
Platelets: 266 10*3/uL (ref 150–400)
RBC: 3.9 MIL/uL (ref 3.87–5.11)
RDW: 12.1 % (ref 11.5–15.5)
WBC: 14.3 10*3/uL — ABNORMAL HIGH (ref 4.0–10.5)
nRBC: 0 % (ref 0.0–0.2)

## 2019-10-08 LAB — BASIC METABOLIC PANEL
Anion gap: 13 (ref 5–15)
BUN: 9 mg/dL (ref 6–20)
CO2: 25 mmol/L (ref 22–32)
Calcium: 9.1 mg/dL (ref 8.9–10.3)
Chloride: 96 mmol/L — ABNORMAL LOW (ref 98–111)
Creatinine, Ser: 0.8 mg/dL (ref 0.44–1.00)
GFR calc Af Amer: >60 mL/min (ref >60)
GFR calc non Af Amer: >60 mL/min (ref >60)
Glucose, Bld: 502 mg/dL (ref 70–99)
Potassium: 2.7 mmol/L — CL (ref 3.5–5.1)
Sodium: 134 mmol/L — ABNORMAL LOW (ref 135–145)

## 2019-10-08 LAB — TROPONIN I (HIGH SENSITIVITY)
Troponin I (High Sensitivity): 12 ng/L (ref <18)
Troponin I (High Sensitivity): 16 ng/L (ref <18)

## 2019-10-08 LAB — BETA-HYDROXYBUTYRIC ACID: Beta-Hydroxybutyric Acid: 0.21 mmol/L (ref 0.05–0.27)

## 2019-10-08 LAB — POCT PREGNANCY, URINE: Preg Test, Ur: NEGATIVE

## 2019-10-08 MED ORDER — TOLTERODINE TARTRATE ER 4 MG PO CP24
4.0000 mg | ORAL_CAPSULE | Freq: Every day | ORAL | 2 refills | Status: DC
Start: 2019-10-08 — End: 2019-10-08

## 2019-10-08 MED ORDER — INSULIN ASPART 100 UNIT/ML ~~LOC~~ SOLN
SUBCUTANEOUS | Status: AC
  Filled 2019-10-08: qty 1

## 2019-10-08 MED ORDER — POTASSIUM CHLORIDE CRYS ER 20 MEQ PO TBCR: 20.0000 meq | EXTENDED_RELEASE_TABLET | Freq: Every day | ORAL | 0 refills | Status: AC

## 2019-10-08 MED ORDER — SODIUM CHLORIDE 0.9 % IV BOLUS
1000.0000 mL | Freq: Once | INTRAVENOUS | Status: AC
  Administered 2019-10-08: 1000 mL via INTRAVENOUS

## 2019-10-08 MED ORDER — INSULIN ASPART PROT & ASPART (70-30 MIX) 100 UNIT/ML ~~LOC~~ SUSP
85.0000 [IU] | Freq: Once | SUBCUTANEOUS | Status: AC
  Administered 2019-10-08: 85 [IU] via SUBCUTANEOUS
  Filled 2019-10-08: qty 10

## 2019-10-08 MED ORDER — INSULIN ASPART 100 UNIT/ML ~~LOC~~ SOLN
10.0000 [IU] | Freq: Once | SUBCUTANEOUS | Status: AC
  Administered 2019-10-08: 10 [IU] via INTRAVENOUS
  Filled 2019-10-08: qty 1

## 2019-10-08 MED ORDER — SODIUM CHLORIDE 0.9 % IV BOLUS
500.0000 mL | Freq: Once | INTRAVENOUS | Status: AC
  Administered 2019-10-08: 500 mL via INTRAVENOUS

## 2019-10-08 MED ORDER — PHENAZOPYRIDINE HCL 200 MG PO TABS
200.0000 mg | ORAL_TABLET | Freq: Once | ORAL | Status: AC
  Administered 2019-10-08: 200 mg via ORAL
  Filled 2019-10-08: qty 1

## 2019-10-08 MED ORDER — TOLTERODINE TARTRATE ER 4 MG PO CP24
4.0000 mg | ORAL_CAPSULE | Freq: Every day | ORAL | 2 refills | Status: AC
Start: 2019-10-08 — End: 2020-10-07

## 2019-10-08 MED ORDER — POTASSIUM CHLORIDE CRYS ER 20 MEQ PO TBCR: 20.0000 meq | EXTENDED_RELEASE_TABLET | Freq: Every day | ORAL | 0 refills | Status: DC

## 2019-10-08 MED ORDER — POTASSIUM CHLORIDE CRYS ER 20 MEQ PO TBCR
40.0000 meq | EXTENDED_RELEASE_TABLET | Freq: Once | ORAL | Status: AC
  Administered 2019-10-08: 40 meq via ORAL
  Filled 2019-10-08: qty 2

## 2019-10-08 NOTE — ED Notes (Signed)
Complete lab tube rainbow drawn on pt

## 2019-10-08 NOTE — ED Triage Notes (Signed)
Here for generalized weakness for a week with multiple falls. Pt reports falling twice today because of the weakness. No pain. No fever.  Unlabored. VSS.

## 2019-10-08 NOTE — ED Notes (Addendum)
Pt st falling today at 1030 trying tot walk to her car to come to [facility] ED due weakness for 1 week.  Pt st "I fell forward, I do not remember how I fell." denies hitting her head.  Pt was found by granddaughter.  Pt st urinary frequency since yesterday. Denies dysuria/hematuria.   Pt denies CP/SHOB/dizziness.   A/Ox4.

## 2019-10-08 NOTE — ED Triage Notes (Signed)
First Nurse Note:  Patient reports weakness x 1 week and reports falling x 2 today.  Patient appears weak and frail.

## 2019-10-08 NOTE — ED Notes (Signed)
Pt taken to bathroom to void in wheelchair.

## 2019-10-08 NOTE — ED Notes (Signed)
Pt taking in wheelchair to use restroom. Pt able to use toilet independently.

## 2019-10-08 NOTE — ED Provider Notes (Signed)
ER Provider Note       Time seen: 6:37 PM    I have reviewed the vital signs and the nursing notes.  HISTORY   Chief Complaint Fall and Weakness    HPI Andrea [name] is a 59 y.o. female with a history of diabetes, hyperlipidemia, hypothyroidism who presents today for weakness for the past week with multiple falls.  Patient reports falling twice today because of weakness.  She states she is recently had a "stomach infection" that has made her lose weight.  Past Medical History:  Diagnosis Date  . Diabetes mellitus without complication ([place])   . Hyperlipidemia   . Hypothyroidism   . Thyroid disease     Past Surgical History:  Procedure Laterality Date  . INCISION AND DRAINAGE ABSCESS Right 06/15/2017   Procedure: INCISION AND DRAINAGE ABSCESS;  Surgeon: Clayburn Pert, MD;  Location: [facility] ORS;  Service: General;  Laterality: Right;    Allergies Latex  Review of Systems Constitutional: Negative for fever. Cardiovascular: Negative for chest pain. Respiratory: Negative for shortness of breath. Gastrointestinal: Negative for abdominal pain, vomiting and diarrhea. Musculoskeletal: Negative for back pain. Skin: Negative for rash. Neurological: Positive for weakness  All systems negative/normal/unremarkable except as stated in the HPI  ____________________________________________   PHYSICAL EXAM:  VITAL SIGNS: Vitals:   10/08/19 1337 10/08/19 1731  BP: (!) 102/55 117/76  Pulse: (!) 124 (!) 126  Resp: 18 (!) 22  Temp: 98.7 F (37.1 C) 98.5 F (36.9 C)  SpO2: 100% 95%    Constitutional: Alert and oriented. Well appearing and in no distress. Eyes: Conjunctivae are normal. Normal extraocular movements. Cardiovascular: Normal rate, regular rhythm. No murmurs, rubs, or gallops. Respiratory: Normal respiratory effort without tachypnea nor retractions. Breath sounds are clear and equal bilaterally. No wheezes/rales/rhonchi. Gastrointestinal: Soft and nontender.  Normal bowel sounds Musculoskeletal: Nontender with normal range of motion in extremities. No lower extremity tenderness nor edema. Neurologic:  Normal speech and language. No gross focal neurologic deficits are appreciated.  Skin:  Skin is warm, dry and intact. No rash noted. Psychiatric: Speech and behavior are normal.  ____________________________________________  EKG: Interpreted by me.  Sinus tachycardia with rate of 130 bpm, normal PR interval, normal QRS, normal QT  ____________________________________________   LABS (pertinent positives/negatives)  Labs Reviewed  BASIC METABOLIC PANEL - Abnormal; Notable for the following components:      Result Value   Sodium 134 (*)    Potassium 2.7 (*)    Chloride 96 (*)    Glucose, Bld 502 (*)    All other components within normal limits  CBC - Abnormal; Notable for the following components:   WBC 14.3 (*)    Hemoglobin 11.3 (*)    HCT 32.0 (*)    All other components within normal limits  URINALYSIS, COMPLETE (UACMP) WITH MICROSCOPIC - Abnormal; Notable for the following components:   Color, Urine COLORLESS (*)    APPearance CLEAR (*)    Glucose, UA >=500 (*)    All other components within normal limits  GLUCOSE, CAPILLARY - Abnormal; Notable for the following components:   Glucose-Capillary 459 (*)    All other components within normal limits  GLUCOSE, CAPILLARY - Abnormal; Notable for the following components:   Glucose-Capillary 534 (*)    All other components within normal limits  BLOOD GAS, VENOUS - Abnormal; Notable for the following components:   pO2, Ven 31.0 (*)    All other components within normal limits  GLUCOSE, CAPILLARY - Abnormal; Notable for  the following components:   Glucose-Capillary 396 (*)    All other components within normal limits  BETA-HYDROXYBUTYRIC ACID  CBG MONITORING, ED  POC URINE PREG, ED  POCT PREGNANCY, URINE  TROPONIN I (HIGH SENSITIVITY)  TROPONIN I (HIGH SENSITIVITY)   DIFFERENTIAL  DIAGNOSIS  Dehydration, electrolyte abnormality, arrhythmia, MI, DKA  ASSESSMENT AND PLAN  Weakness, hyperglycemia, hypokalemia   Plan: The patient had presented for weakness with recent falls. Patient's labs did reveal significant hyperglycemia.  She had received oral potassium and IV fluids prior to my evaluation.  She was due for her nightly 70/30 insulin which we gave her.  Her blood sugars appear to be finally starting to come down.  There are no signs of DKA.  She is cleared for outpatient follow-up with potassium supplements.  Lenise Arena MD    Note: This note was generated in part or whole with voice recognition software. Voice recognition is usually quite accurate but there are transcription errors that can and very often do occur. I apologize for any typographical errors that were not detected and corrected.     Earleen Newport, MD 10/08/19 2117

## 2019-10-18 ENCOUNTER — Emergency Department
Admission: EM | Admit: 2019-10-18 | Discharge: 2019-10-18 | Disposition: A | Discharge status: Home / Self Care | Payer: Medicaid Other | Attending: Emergency Medicine | Admitting: Emergency Medicine

## 2019-10-18 ENCOUNTER — Other Ambulatory Visit: Payer: Self-pay

## 2019-10-18 DIAGNOSIS — E11649 Type 2 diabetes mellitus with hypoglycemia without coma: Secondary | ICD-10-CM | POA: Insufficient documentation

## 2019-10-18 DIAGNOSIS — E039 Hypothyroidism, unspecified: Secondary | ICD-10-CM | POA: Diagnosis not present

## 2019-10-18 DIAGNOSIS — E162 Hypoglycemia, unspecified: Secondary | ICD-10-CM

## 2019-10-18 DIAGNOSIS — Z79899 Other long term (current) drug therapy: Secondary | ICD-10-CM | POA: Insufficient documentation

## 2019-10-18 DIAGNOSIS — Z794 Long term (current) use of insulin: Secondary | ICD-10-CM | POA: Insufficient documentation

## 2019-10-18 LAB — CBC WITH DIFFERENTIAL/PLATELET
Abs Immature Granulocytes: 0.03 10*3/uL (ref 0.00–0.07)
Basophils Absolute: 0 10*3/uL (ref 0.0–0.1)
Basophils Relative: 0 %
Eosinophils Absolute: 0 10*3/uL (ref 0.0–0.5)
Eosinophils Relative: 0 %
HCT: 35.6 % — ABNORMAL LOW (ref 36.0–46.0)
Hemoglobin: 12.5 g/dL (ref 12.0–15.0)
Immature Granulocytes: 0 %
Lymphocytes Relative: 12 %
Lymphs Abs: 1.1 10*3/uL (ref 0.7–4.0)
MCH: 29.1 pg (ref 26.0–34.0)
MCHC: 35.1 g/dL (ref 30.0–36.0)
MCV: 82.8 fL (ref 80.0–100.0)
Monocytes Absolute: 0.6 10*3/uL (ref 0.1–1.0)
Monocytes Relative: 7 %
Neutro Abs: 7.5 10*3/uL (ref 1.7–7.7)
Neutrophils Relative %: 81 %
Platelets: 377 10*3/uL (ref 150–400)
RBC: 4.3 MIL/uL (ref 3.87–5.11)
RDW: 13.2 % (ref 11.5–15.5)
WBC: 9.2 10*3/uL (ref 4.0–10.5)
nRBC: 0 % (ref 0.0–0.2)

## 2019-10-18 LAB — COMPREHENSIVE METABOLIC PANEL
ALT: 14 U/L (ref 0–44)
AST: 30 U/L (ref 15–41)
Albumin: 3.4 g/dL — ABNORMAL LOW (ref 3.5–5.0)
Alkaline Phosphatase: 71 U/L (ref 38–126)
Anion gap: 14 (ref 5–15)
BUN: 9 mg/dL (ref 6–20)
CO2: 30 mmol/L (ref 22–32)
Calcium: 9.8 mg/dL (ref 8.9–10.3)
Chloride: 102 mmol/L (ref 98–111)
Creatinine, Ser: 0.63 mg/dL (ref 0.44–1.00)
GFR calc Af Amer: >60 mL/min (ref >60)
GFR calc non Af Amer: >60 mL/min (ref >60)
Glucose, Bld: 30 mg/dL — CL (ref 70–99)
Potassium: 3 mmol/L — ABNORMAL LOW (ref 3.5–5.1)
Sodium: 146 mmol/L — ABNORMAL HIGH (ref 135–145)
Total Bilirubin: 0.8 mg/dL (ref 0.3–1.2)
Total Protein: 7.8 g/dL (ref 6.5–8.1)

## 2019-10-18 LAB — GLUCOSE, CAPILLARY
Glucose-Capillary: 13 mg/dL — CL (ref 70–99)
Glucose-Capillary: 15 mg/dL — CL (ref 70–99)
Glucose-Capillary: 156 mg/dL — ABNORMAL HIGH (ref 70–99)
Glucose-Capillary: 124 mg/dL — ABNORMAL HIGH (ref 70–99)
Glucose-Capillary: 93 mg/dL (ref 70–99)
Glucose-Capillary: 143 mg/dL — ABNORMAL HIGH (ref 70–99)

## 2019-10-18 MED ORDER — DEXTROSE 50 % IV SOLN
1.0000 | Freq: Once | INTRAVENOUS | Status: AC
  Administered 2019-10-18: 50 mL via INTRAVENOUS

## 2019-10-18 MED ORDER — DEXTROSE 50 % IV SOLN
50.0000 mL | Freq: Once | INTRAVENOUS | Status: AC
  Administered 2019-10-18: 50 mL via INTRAVENOUS

## 2019-10-18 MED ORDER — DEXTROSE 5 % IV SOLN: Freq: Once | INTRAVENOUS | Status: AC

## 2019-10-18 NOTE — ED Provider Notes (Signed)
Medical screening examination/treatment/procedure(s) were conducted as a shared visit with non-physician practitioner(s) and myself.  I personally evaluated the patient during the encounter. Briefly, the patient is a 59 yo female with h/o brittle T1DM here w/ hypoglycemia. Pt has reportedly had increasing difficulty controlling her BS at home, has unfortunately gone from A1C of <9 to >15 on recent check up. She's had some associated weight loss which I suspect is all related to her brittle diabetes. Today, suspect hypoglycemia 2/2 poor appetite with high insulin dose. Improving here. Will monitor, PO challenge. If stable and improving, will have her cut back her insulin dose for now and call her endocrinologist in the AM.  EKG: Normal sinus rhythm, VR 111. QRS 78, QTc 494. No acute ST elevations or depressions. No EKG evidence of acute ischemia or infarct.     Duffy Bruce, MD 10/18/19 [phone]

## 2019-10-18 NOTE — ED Notes (Signed)
Pt given meal tray and juice, recheck bg after eating per EDP

## 2019-10-18 NOTE — ED Triage Notes (Addendum)
Pt arrives via EMS from home after being called out for a possible stroke- pt was found to have a cbg of 24 initially- pt was given 15g of glucose and repeat cbg was 40- pt VSS- pt was also given 1 mg IM glucagon- pt remains nonverbal at this time

## 2019-10-18 NOTE — ED Notes (Signed)
Date and time results received: 10/18/19 1531 (use smartphrase ".now" to insert current time)  Test: glucose Critical Value: 30  Name of Provider Notified: Isaacs  Orders Received? Or Actions Taken?: NA

## 2019-10-18 NOTE — Discharge Instructions (Addendum)
Decrease your insulin dosage to 20units twice daily before a meal.  If you blood sugar is below 80, do not take your insulin.

## 2019-10-18 NOTE — ED Notes (Signed)
IVF stopped per EDP order, pt given OJ 8 oz per EDP order

## 2019-10-18 NOTE — ED Provider Notes (Signed)
[facility] Emergency Department Provider Note ____________________________________________   First MD Initiated Contact with Patient 10/18/19 1513     (approximate)  I have reviewed the triage vital signs and the nursing notes.   HISTORY  Chief Complaint Hypoglycemia  HPI Andrea [name] is a 59 y.o. female with a history of diabetes, hyperlipidemia, hypothyroidism who presents to the emergency department for treatment and evaluation of hypoglycemia.  EMS report her initial glucose was 24.  They gave her 15 g of glucose and 1 mg glucagon IM.  Repeat CBG was 40.  Patient is awake but not talking at the moment.      Past Medical History:  Diagnosis Date   Diabetes mellitus without complication ([place])    Hyperlipidemia    Hypothyroidism    Thyroid disease     Patient Active Problem List   Diagnosis Date Noted   Back pain 06/14/2017   Fibroids 06/14/2017   Herpes simplex 06/14/2017   Hypertension associated with diabetes ([place]) 06/14/2017   Hypothyroid 06/14/2017   Menometrorrhagia 06/14/2017   Ovarian failure 09/22/2016   Colon polyps 10/29/2014   Left foot pain 04/15/2014   Abnormal uterine bleeding 02/24/2014   Cervical polyp 10/16/2013   High risk medication use 10/16/2013   Hyperlipidemia 10/16/2013   Increased frequency of urination 10/16/2013   Diabetes mellitus type 1 ([place]) 01/06/2013   Diabetic keto-acidosis ([place]) 01/01/2013   Graves disease 01/01/2013   Hyperglycemia 01/01/2013   Urine ketones 01/01/2013   Annual physical exam 08/15/2012   Pigmented skin lesions 08/15/2012    Past Surgical History:  Procedure Laterality Date   INCISION AND DRAINAGE ABSCESS Right 06/15/2017   Procedure: INCISION AND DRAINAGE ABSCESS;  Surgeon: Clayburn Pert, MD;  Location: [facility] ORS;  Service: General;  Laterality: Right;    Prior to Admission medications   Medication Sig Start Date End Date Taking? Authorizing Provider    aspirin EC 81 MG tablet Take 81 mg by mouth daily.    [provider]  Cholecalciferol 2000 units TABS Take 2,000 Units by mouth 2 (two) times daily.    [provider]  insulin NPH-regular Human (NOVOLIN 70/30) (70-30) 100 UNIT/ML injection Inject 34 Units into the skin 2 (two) times daily before a meal.    [provider]  levonorgestrel (MIRENA) 20 MCG/24HR IUD by Intrauterine route.    [provider]  levothyroxine (SYNTHROID, LEVOTHROID) 88 MCG tablet Take 88 mcg by mouth daily before breakfast.    [provider]  lisinopril (PRINIVIL,ZESTRIL) 20 MG tablet Take 20 mg by mouth daily.    [provider]  neomycin-polymyxin-hydrocortisone (CORTISPORIN) 3.5-10000-1 OTIC suspension Place 4 drops into the left ear 3 (three) times daily. X 5 days 08/16/19   Karen Kitchens, NP  potassium chloride SA (KLOR-CON) 20 MEQ tablet Take 1 tablet (20 mEq total) by mouth daily. 10/08/19   Earleen Newport, MD  rosuvastatin (CRESTOR) 20 MG tablet Take by mouth. 08/04/19   [provider]  tolterodine (DETROL LA) 4 MG 24 hr capsule Take 1 capsule (4 mg total) by mouth daily. 10/08/19 10/07/20  Earleen Newport, MD  pravastatin (PRAVACHOL) 40 MG tablet Take 40 mg by mouth every evening.   08/16/19  [provider]    Allergies Latex  Family History  Problem Relation Age of Onset   Heart disease Father    Breast cancer Neg Hx     Social History Social History   Tobacco Use   Smoking  status: Never Smoker   Smokeless tobacco: Never Used  Vaping Use   Vaping Use: Never used  Substance Use Topics   Alcohol use: No   Drug use: No    Review of Systems  Level 5 caveat: Patient is nonverbal related to hypoglycemia ____________________________________________   PHYSICAL EXAM:  VITAL SIGNS: ED Triage Vitals  Enc Vitals Group     BP 10/18/19 1502 (!) 145/110     Pulse Rate 10/18/19 1504 (!) 111     Resp 10/18/19 1502  18     Temp 10/18/19 1502 98.1 F (36.7 C)     Temp Source 10/18/19 1502 Oral     SpO2 10/18/19 1507 99 %     Weight 10/18/19 1456 118 lb (53.5 kg)     Height 10/18/19 1456 5\' 7"  (1.702 m)     Head Circumference --      Peak Flow --      Pain Score --      Pain Loc --      Pain Edu? --      Excl. in [place]? --     Constitutional: Alert and oriented. Well appearing and in no acute distress. Eyes: Conjunctivae are normal. PERRL. EOMI. Head: Atraumatic. Nose: No congestion/rhinnorhea. Mouth/Throat: Mucous membranes are moist.  Oropharynx non-erythematous. Neck: No stridor.   Hematological/Lymphatic/Immunilogical: No cervical lymphadenopathy. Cardiovascular: Normal rate, regular rhythm. Grossly normal heart sounds.  Good peripheral circulation. Respiratory: Normal respiratory effort.  No retractions. Lungs CTAB. Gastrointestinal: Soft and nontender. No distention. No abdominal bruits. No CVA tenderness. Genitourinary:  Musculoskeletal: No lower extremity tenderness nor edema.  No joint effusions. Neurologic:  Normal speech and language. No gross focal neurologic deficits are appreciated. No gait instability. Skin:  Skin is warm, dry and intact. No rash noted. Psychiatric: Mood and affect are normal. Speech and behavior are normal.  ____________________________________________   LABS (all labs ordered are listed, but only abnormal results are displayed)  Labs Reviewed  GLUCOSE, CAPILLARY - Abnormal; Notable for the following components:      Result Value   Glucose-Capillary 15 (*)    All other components within normal limits  CBC WITH DIFFERENTIAL/PLATELET - Abnormal; Notable for the following components:   HCT 35.6 (*)    All other components within normal limits  COMPREHENSIVE METABOLIC PANEL - Abnormal; Notable for the following components:   Sodium 146 (*)    Potassium 3.0 (*)    Glucose, Bld 30 (*)    Albumin 3.4 (*)    All other components within normal limits  GLUCOSE,  CAPILLARY - Abnormal; Notable for the following components:   Glucose-Capillary 13 (*)    All other components within normal limits  GLUCOSE, CAPILLARY - Abnormal; Notable for the following components:   Glucose-Capillary 156 (*)    All other components within normal limits  GLUCOSE, CAPILLARY - Abnormal; Notable for the following components:   Glucose-Capillary 124 (*)    All other components within normal limits  GLUCOSE, CAPILLARY - Abnormal; Notable for the following components:   Glucose-Capillary 143 (*)    All other components within normal limits  GLUCOSE, CAPILLARY - Abnormal; Notable for the following components:   Glucose-Capillary 228 (*)    All other components within normal limits  GLUCOSE, CAPILLARY   ____________________________________________  EKG  ED ECG REPORT I, Timera Windt, FNP-BC personally viewed and interpreted this ECG.   Date: 10/18/2019  EKG Time: 1508  Rate: 111  Rhythm: sinus tachycardia  Axis: normal  Intervals:none  ST&T Change: no ST elevation  ____________________________________________  RADIOLOGY  ED MD interpretation:    Not indicated.  I, Sherrie George, personally viewed and evaluated these images (plain radiographs) as part of my medical decision making, as well as reviewing the written report by the radiologist.  Official radiology report(s): No results found.  ____________________________________________   PROCEDURES  Procedure(s) performed (including Critical Care):  Procedures  ____________________________________________   INITIAL IMPRESSION / ASSESSMENT AND PLAN     59 year old female presenting to the emergency department for treatment of hypoglycemia.  EMS reports that they were initially called out for stroke symptoms but upon arrival glucose was found to be 24.  EMS gave 15 g of glucose and 1 mg of glucagon.  Repeat Pete CBG was 40.  DIFFERENTIAL DIAGNOSIS  Hypoglycemia, CVA  ED COURSE  Initial CBG  here was 15.  1 amp of D50 was given.  D5 drip started as well.  Patient is awake, alert, and is now talking.  She denies any headache, chest pain, nausea, vomiting, diarrhea.  She states that she did eat today.  She took her insulin around 11 or 12:00 this afternoon.  She states that her grandson checked her sugar before she took her insulin and it was "40 something."  She took her short acting insulin despite the lower reading.  Patient was monitored closely for about 4 hours. Glucose was stable and she ate and drank while here. I recommended that she cut her insulin dosage in about half until she can follow up with her PCP. Listed in the computer, she is to take 34 units twice daily, but the patient states that she is taking 80 units in the morning and 85 units in the evening. She was advised to take the prescribed 34 units as prescribed. ____________________________________________   FINAL CLINICAL IMPRESSION(S) / ED DIAGNOSES  Final diagnoses:  Hypoglycemia     ED Discharge Orders    None       Margarit Minshall was evaluated in Emergency Department on 10/19/2019 for the symptoms described in the history of present illness. She was evaluated in the context of the global COVID-19 pandemic, which necessitated consideration that the patient might be at risk for infection with the SARS-CoV-2 virus that causes COVID-19. Institutional protocols and algorithms that pertain to the evaluation of patients at risk for COVID-19 are in a state of rapid change based on information released by regulatory bodies including the CDC and federal and state organizations. These policies and algorithms were followed during the patient's care in the ED.   Note:  This document was prepared using Dragon voice recognition software and may include unintentional dictation errors.   Victorino Dike, FNP 10/19/19 1234    Duffy Bruce, MD 10/20/19 [phone]

## 2019-10-18 NOTE — ED Notes (Signed)
Pt less shaky than on admission, pt states she is starting to feel better. Grandson at bedside.

## 2019-10-19 ENCOUNTER — Observation Stay (HOSPITAL_COMMUNITY): Payer: Medicaid Other

## 2019-10-19 ENCOUNTER — Emergency Department (HOSPITAL_COMMUNITY): Payer: Medicaid Other

## 2019-10-19 ENCOUNTER — Inpatient Hospital Stay (HOSPITAL_COMMUNITY)
Admit: 2019-10-19 | Discharge: 2019-11-12 | DRG: 637 | Disposition: A | Discharge status: Skilled Nursing Facility | Payer: Medicaid Other | Attending: Internal Medicine | Admitting: Internal Medicine

## 2019-10-19 ENCOUNTER — Other Ambulatory Visit: Payer: Self-pay

## 2019-10-19 DIAGNOSIS — Z91040 Latex allergy status: Secondary | ICD-10-CM

## 2019-10-19 DIAGNOSIS — E876 Hypokalemia: Secondary | ICD-10-CM

## 2019-10-19 DIAGNOSIS — Z7982 Long term (current) use of aspirin: Secondary | ICD-10-CM

## 2019-10-19 DIAGNOSIS — Z681 Body mass index (BMI) 19 or less, adult: Secondary | ICD-10-CM

## 2019-10-19 DIAGNOSIS — E10649 Type 1 diabetes mellitus with hypoglycemia without coma: Principal | ICD-10-CM | POA: Diagnosis present

## 2019-10-19 DIAGNOSIS — R414 Neurologic neglect syndrome: Secondary | ICD-10-CM | POA: Diagnosis present

## 2019-10-19 DIAGNOSIS — Z87891 Personal history of nicotine dependence: Secondary | ICD-10-CM

## 2019-10-19 DIAGNOSIS — R000 Tachycardia, unspecified: Secondary | ICD-10-CM | POA: Diagnosis present

## 2019-10-19 DIAGNOSIS — I10 Essential (primary) hypertension: Secondary | ICD-10-CM | POA: Diagnosis present

## 2019-10-19 DIAGNOSIS — G9341 Metabolic encephalopathy: Secondary | ICD-10-CM | POA: Diagnosis present

## 2019-10-19 DIAGNOSIS — T383X5A Adverse effect of insulin and oral hypoglycemic [antidiabetic] drugs, initial encounter: Secondary | ICD-10-CM | POA: Diagnosis not present

## 2019-10-19 DIAGNOSIS — G40901 Epilepsy, unspecified, not intractable, with status epilepticus: Secondary | ICD-10-CM

## 2019-10-19 DIAGNOSIS — E2839 Other primary ovarian failure: Secondary | ICD-10-CM

## 2019-10-19 DIAGNOSIS — Z7189 Other specified counseling: Secondary | ICD-10-CM

## 2019-10-19 DIAGNOSIS — E160 Drug-induced hypoglycemia without coma: Secondary | ICD-10-CM

## 2019-10-19 DIAGNOSIS — R911 Solitary pulmonary nodule: Secondary | ICD-10-CM | POA: Diagnosis present

## 2019-10-19 DIAGNOSIS — Z975 Presence of (intrauterine) contraceptive device: Secondary | ICD-10-CM

## 2019-10-19 DIAGNOSIS — Z515 Encounter for palliative care: Secondary | ICD-10-CM

## 2019-10-19 DIAGNOSIS — G8194 Hemiplegia, unspecified affecting left nondominant side: Secondary | ICD-10-CM | POA: Diagnosis present

## 2019-10-19 DIAGNOSIS — R54 Age-related physical debility: Secondary | ICD-10-CM | POA: Diagnosis present

## 2019-10-19 DIAGNOSIS — E162 Hypoglycemia, unspecified: Secondary | ICD-10-CM | POA: Diagnosis present

## 2019-10-19 DIAGNOSIS — K5641 Fecal impaction: Secondary | ICD-10-CM | POA: Diagnosis present

## 2019-10-19 DIAGNOSIS — I4891 Unspecified atrial fibrillation: Secondary | ICD-10-CM | POA: Diagnosis present

## 2019-10-19 DIAGNOSIS — Z794 Long term (current) use of insulin: Secondary | ICD-10-CM

## 2019-10-19 DIAGNOSIS — Z8249 Family history of ischemic heart disease and other diseases of the circulatory system: Secondary | ICD-10-CM

## 2019-10-19 DIAGNOSIS — R29810 Facial weakness: Secondary | ICD-10-CM | POA: Diagnosis present

## 2019-10-19 DIAGNOSIS — G8384 Todd's paralysis (postepileptic): Secondary | ICD-10-CM | POA: Diagnosis present

## 2019-10-19 DIAGNOSIS — K635 Polyp of colon: Secondary | ICD-10-CM | POA: Diagnosis present

## 2019-10-19 DIAGNOSIS — R824 Acetonuria: Secondary | ICD-10-CM

## 2019-10-19 DIAGNOSIS — Z20822 Contact with and (suspected) exposure to covid-19: Secondary | ICD-10-CM | POA: Diagnosis present

## 2019-10-19 DIAGNOSIS — K5730 Diverticulosis of large intestine without perforation or abscess without bleeding: Secondary | ICD-10-CM | POA: Diagnosis present

## 2019-10-19 DIAGNOSIS — Z79890 Hormone replacement therapy (postmenopausal): Secondary | ICD-10-CM

## 2019-10-19 DIAGNOSIS — L819 Disorder of pigmentation, unspecified: Secondary | ICD-10-CM

## 2019-10-19 DIAGNOSIS — E43 Unspecified severe protein-calorie malnutrition: Secondary | ICD-10-CM | POA: Insufficient documentation

## 2019-10-19 DIAGNOSIS — N1330 Unspecified hydronephrosis: Secondary | ICD-10-CM | POA: Diagnosis present

## 2019-10-19 DIAGNOSIS — E785 Hyperlipidemia, unspecified: Secondary | ICD-10-CM | POA: Diagnosis present

## 2019-10-19 DIAGNOSIS — K633 Ulcer of intestine: Secondary | ICD-10-CM | POA: Diagnosis present

## 2019-10-19 DIAGNOSIS — K5289 Other specified noninfective gastroenteritis and colitis: Secondary | ICD-10-CM | POA: Diagnosis present

## 2019-10-19 DIAGNOSIS — D509 Iron deficiency anemia, unspecified: Secondary | ICD-10-CM | POA: Diagnosis present

## 2019-10-19 DIAGNOSIS — E1069 Type 1 diabetes mellitus with other specified complication: Secondary | ICD-10-CM

## 2019-10-19 DIAGNOSIS — R471 Dysarthria and anarthria: Secondary | ICD-10-CM | POA: Diagnosis present

## 2019-10-19 DIAGNOSIS — E039 Hypothyroidism, unspecified: Secondary | ICD-10-CM | POA: Diagnosis present

## 2019-10-19 DIAGNOSIS — Z79899 Other long term (current) drug therapy: Secondary | ICD-10-CM

## 2019-10-19 DIAGNOSIS — K648 Other hemorrhoids: Secondary | ICD-10-CM | POA: Diagnosis present

## 2019-10-19 DIAGNOSIS — R569 Unspecified convulsions: Secondary | ICD-10-CM

## 2019-10-19 LAB — CBG MONITORING, ED
Glucose-Capillary: 131 mg/dL — ABNORMAL HIGH (ref 70–99)
Glucose-Capillary: 62 mg/dL — ABNORMAL LOW (ref 70–99)
Glucose-Capillary: 46 mg/dL — ABNORMAL LOW (ref 70–99)
Glucose-Capillary: 140 mg/dL — ABNORMAL HIGH (ref 70–99)

## 2019-10-19 LAB — CBC
HCT: 31.4 % — ABNORMAL LOW (ref 36.0–46.0)
Hemoglobin: 10.2 g/dL — ABNORMAL LOW (ref 12.0–15.0)
MCH: 28.8 pg (ref 26.0–34.0)
MCHC: 32.5 g/dL (ref 30.0–36.0)
MCV: 88.7 fL (ref 80.0–100.0)
Platelets: 329 10*3/uL (ref 150–400)
RBC: 3.54 MIL/uL — ABNORMAL LOW (ref 3.87–5.11)
RDW: 13.9 % (ref 11.5–15.5)
WBC: 8.9 10*3/uL (ref 4.0–10.5)
nRBC: 0 % (ref 0.0–0.2)

## 2019-10-19 LAB — DIFFERENTIAL
Abs Immature Granulocytes: 0.03 10*3/uL (ref 0.00–0.07)
Basophils Absolute: 0 10*3/uL (ref 0.0–0.1)
Basophils Relative: 0 %
Eosinophils Absolute: 0 10*3/uL (ref 0.0–0.5)
Eosinophils Relative: 0 %
Immature Granulocytes: 0 %
Lymphocytes Relative: 13 %
Lymphs Abs: 1.2 10*3/uL (ref 0.7–4.0)
Monocytes Absolute: 0.8 10*3/uL (ref 0.1–1.0)
Monocytes Relative: 9 %
Neutro Abs: 6.8 10*3/uL (ref 1.7–7.7)
Neutrophils Relative %: 78 %

## 2019-10-19 LAB — I-STAT CHEM 8, ED
BUN: 5 mg/dL — ABNORMAL LOW (ref 6–20)
Calcium, Ion: 1.09 mmol/L — ABNORMAL LOW (ref 1.15–1.40)
Chloride: 105 mmol/L (ref 98–111)
Creatinine, Ser: 0.5 mg/dL (ref 0.44–1.00)
Glucose, Bld: 154 mg/dL — ABNORMAL HIGH (ref 70–99)
HCT: 30 % — ABNORMAL LOW (ref 36.0–46.0)
Hemoglobin: 10.2 g/dL — ABNORMAL LOW (ref 12.0–15.0)
Potassium: 3.1 mmol/L — ABNORMAL LOW (ref 3.5–5.1)
Sodium: 144 mmol/L (ref 135–145)
TCO2: 27 mmol/L (ref 22–32)

## 2019-10-19 LAB — COMPREHENSIVE METABOLIC PANEL
ALT: 15 U/L (ref 0–44)
AST: 31 U/L (ref 15–41)
Albumin: 2.6 g/dL — ABNORMAL LOW (ref 3.5–5.0)
Alkaline Phosphatase: 59 U/L (ref 38–126)
Anion gap: 11 (ref 5–15)
BUN: 6 mg/dL (ref 6–20)
CO2: 24 mmol/L (ref 22–32)
Calcium: 8.7 mg/dL — ABNORMAL LOW (ref 8.9–10.3)
Chloride: 108 mmol/L (ref 98–111)
Creatinine, Ser: 0.76 mg/dL (ref 0.44–1.00)
GFR calc Af Amer: >60 mL/min (ref >60)
GFR calc non Af Amer: >60 mL/min (ref >60)
Glucose, Bld: 155 mg/dL — ABNORMAL HIGH (ref 70–99)
Potassium: 3.2 mmol/L — ABNORMAL LOW (ref 3.5–5.1)
Sodium: 143 mmol/L (ref 135–145)
Total Bilirubin: 0.7 mg/dL (ref 0.3–1.2)
Total Protein: 6.6 g/dL (ref 6.5–8.1)

## 2019-10-19 LAB — SARS CORONAVIRUS 2 BY RT PCR (HOSPITAL ORDER, PERFORMED IN ~~LOC~~ HOSPITAL LAB): SARS Coronavirus 2: NEGATIVE

## 2019-10-19 LAB — PROTIME-INR
INR: 1.1 (ref 0.8–1.2)
Prothrombin Time: 13.8 seconds (ref 11.4–15.2)

## 2019-10-19 LAB — GLUCOSE, CAPILLARY: Glucose-Capillary: 228 mg/dL — ABNORMAL HIGH (ref 70–99)

## 2019-10-19 LAB — I-STAT BETA HCG BLOOD, ED (MC, WL, AP ONLY): I-stat hCG, quantitative: <5 m[IU]/mL (ref <5)

## 2019-10-19 LAB — APTT: aPTT: 22 seconds — ABNORMAL LOW (ref 24–36)

## 2019-10-19 IMAGING — MR MR HEAD W/O CM
10 of 12 series · 34 of 48 positions shown · non-contrast
Comparison: Head CT [DATE]

CLINICAL DATA: Stroke follow-up. Left-sided weakness. Hypoglycemia.
Witnessed seizures by EMS. Clinically suspected PIPPEN paralysis.

EXAM:
MRI HEAD WITHOUT CONTRAST
TECHNIQUE: Multiplanar, multiecho pulse sequences of the brain and surrounding
structures were obtained without intravenous contrast.

[Series 2: DWI · axial · 3.0mm · 0.94mm/px · z∈[-101,+43]mm · 7 of 100 slices shown (1 of 2)]
[im 1/100]
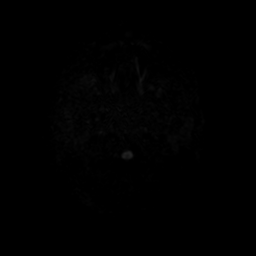
[im 17/100]
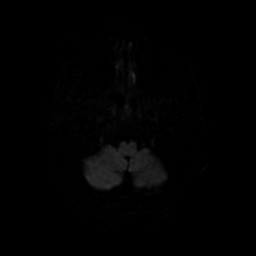
[im 34/100]
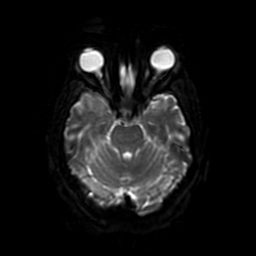
[im 50/100]
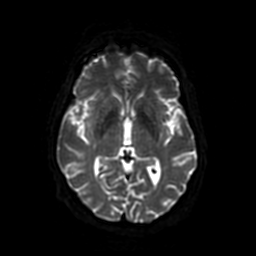
[im 67/100]
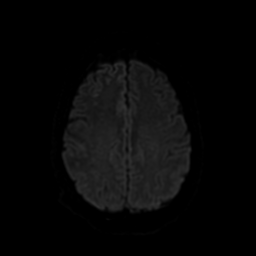
[im 83/100]
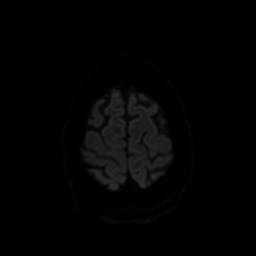
[im 100/100]
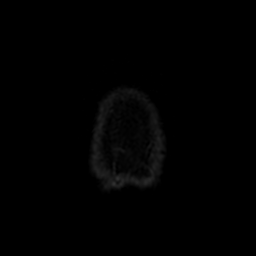

[Series 4: FLAIR · axial · 3.0mm · 0.41mm/px · z∈[-88,+53]mm · 2 of 25 slices shown (1 of 3)]
[im 1/25]
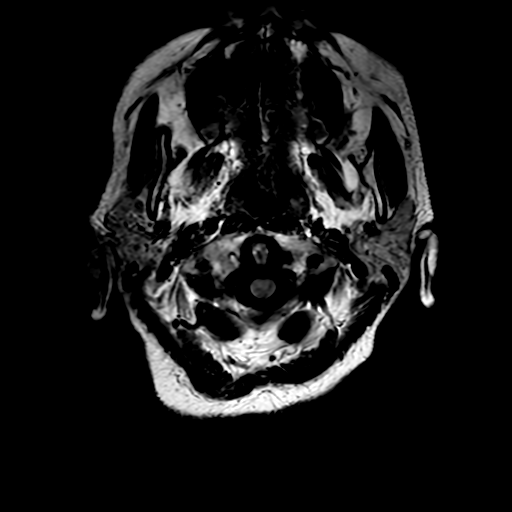
[im 25/25]
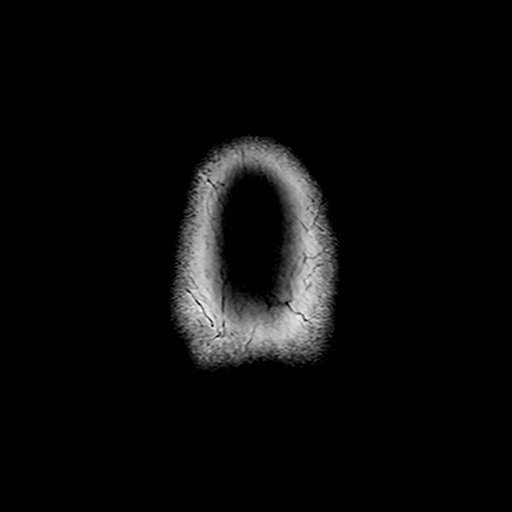

[Series 5: DWI · coronal · 4.0mm · 0.94mm/px · 5 of 72 slices shown (2 of 2)]
[im 1/72]
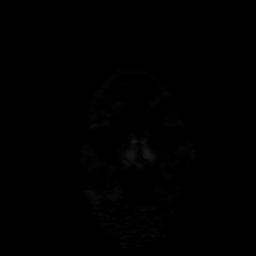
[im 18/72]
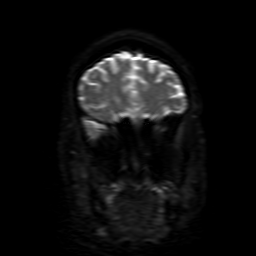
[im 36/72]
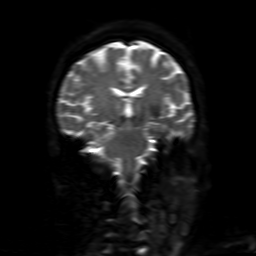
[im 54/72]
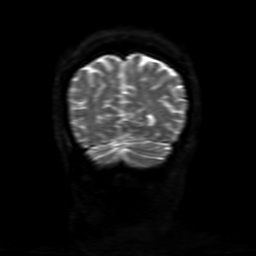
[im 72/72]
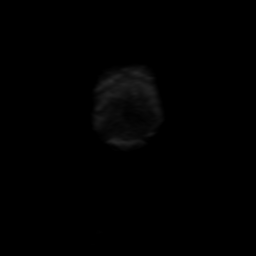

[Series 6: (person_name) · axial · 3.0mm · 0.47mm/px · z∈[-93,-21]mm · 4 of 100 slices shown]
[im 1/100]
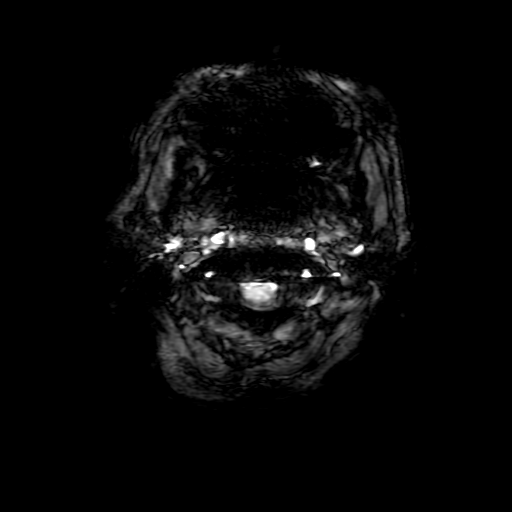
[im 17/100]
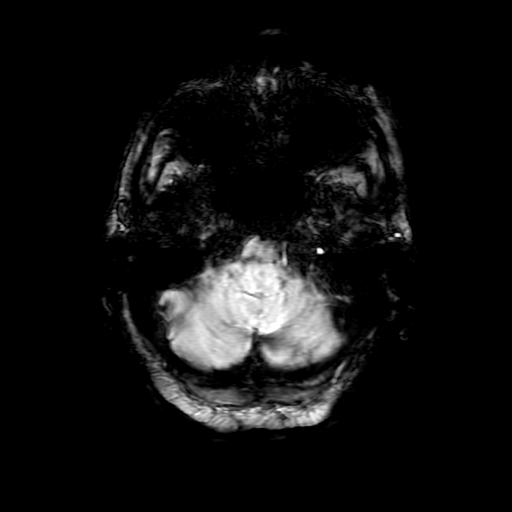
[im 34/100]
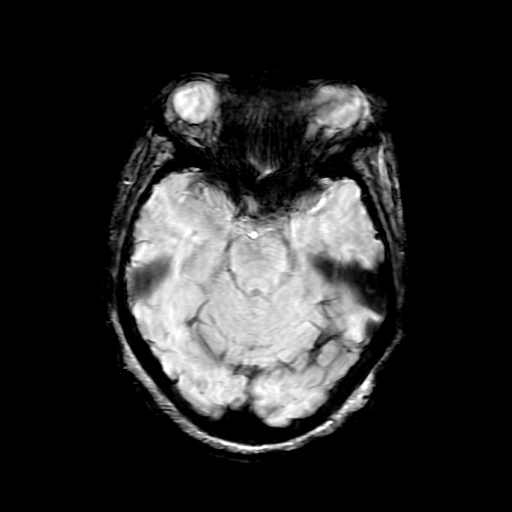
[im 50/100]
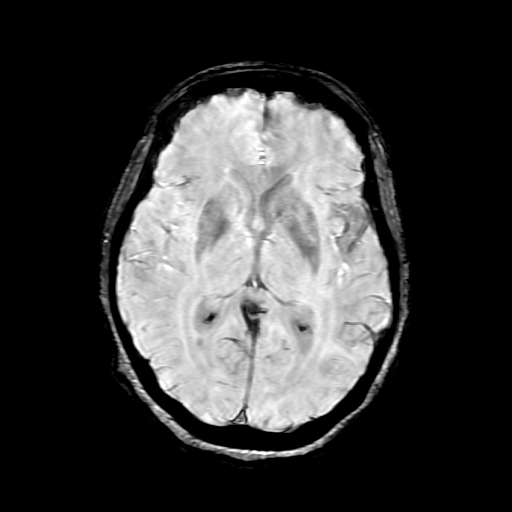

[Series 7: FLAIR · axial · 3.0mm · 0.41mm/px · z∈[-88,+53]mm · 2 of 25 slices shown (2 of 3)]
[im 1/25]
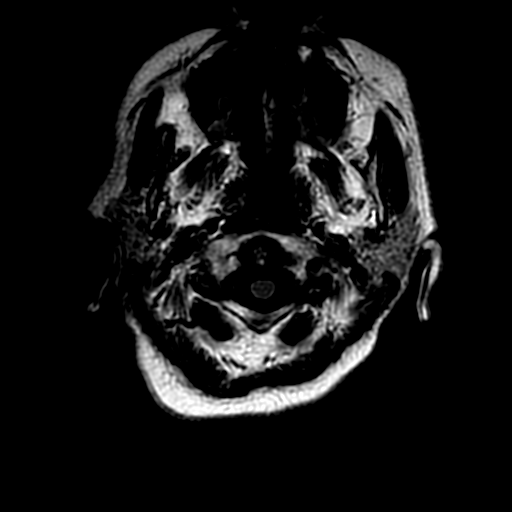
[im 25/25]
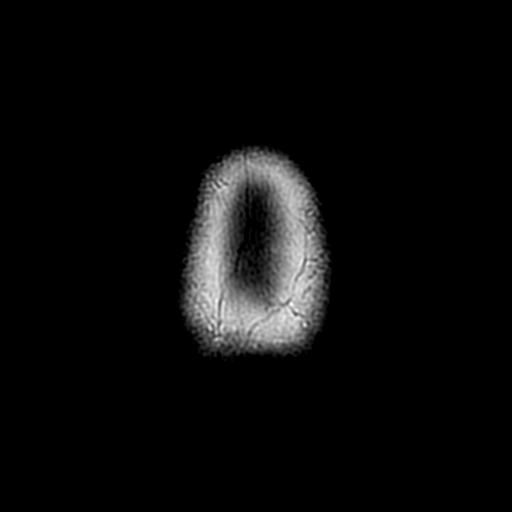

[Series 8: FLAIR · sagittal · 5.0mm · 0.47mm/px · 2 of 25 slices shown (3 of 3)]
[im 1/25]
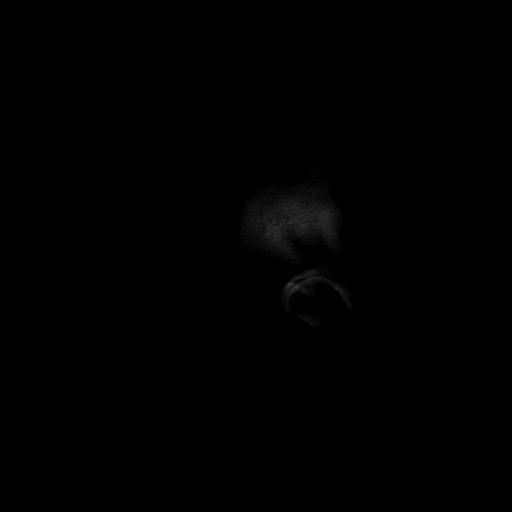
[im 25/25]
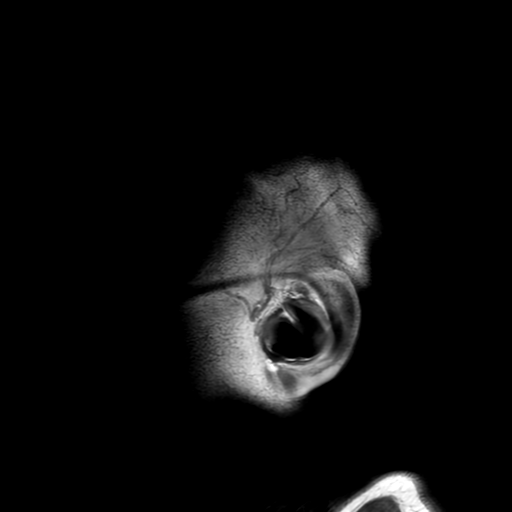

[Series 11: T2 · coronal · 5.0mm · 0.39mm/px · 2 of 30 slices shown]
[im 1/30]
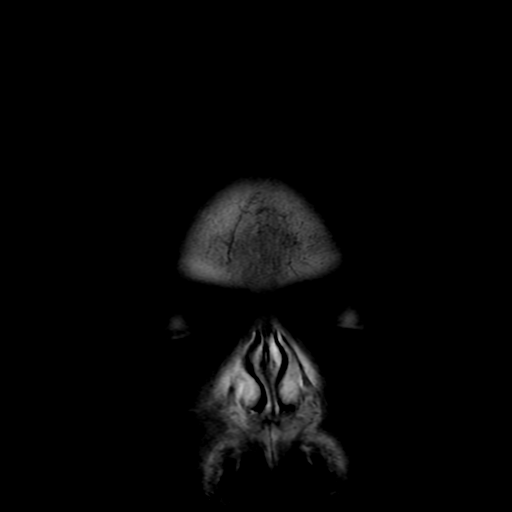
[im 30/30]
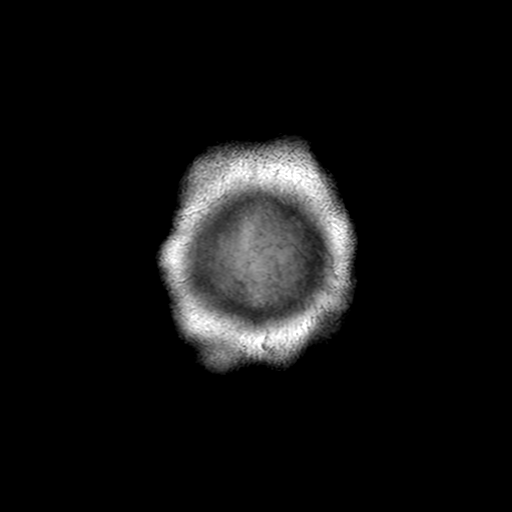

[Series 250: ADC · axial · 3.0mm · 0.94mm/px · z∈[-101,+43]mm · 4 of 50 slices shown (1 of 3)]
[im 1/50]
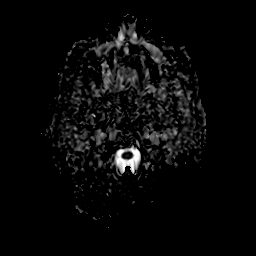
[im 17/50]
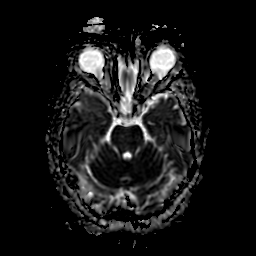
[im 33/50]
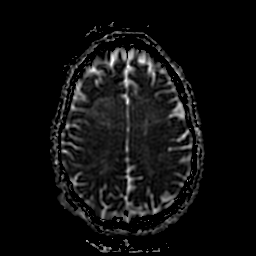
[im 50/50]
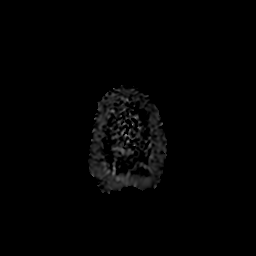

[Series 350: ADC · coronal · 4.0mm · 0.94mm/px · 3 of 36 slices shown (2 of 3)]
[im 1/36]
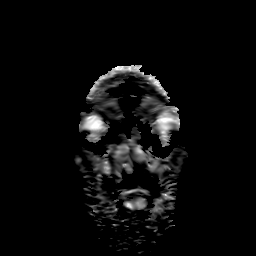
[im 18/36]
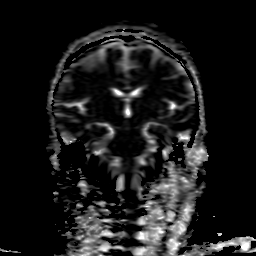
[im 36/36]
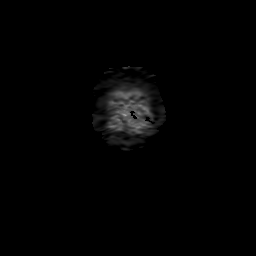

[Series 550: ADC · coronal · 4.0mm · 0.94mm/px · 3 of 35 slices shown (3 of 3)]
[im 1/35]
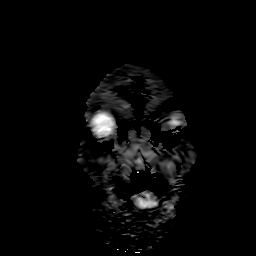
[im 18/35]
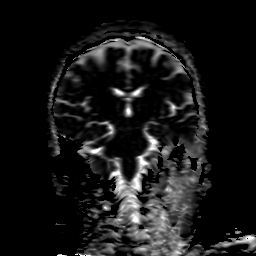
[im 35/35]
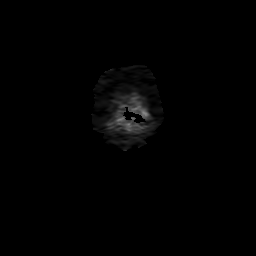

[34 of 48 positions shown; findings below may reference images not displayed]

FINDINGS: The study is mildly motion degraded.

Brain: There is mild restricted diffusion involving cortex in the
medial right frontal lobe, right insula, and likely right
hippocampus which does not conform to a vascular territory and is
without associated edema. No intracranial hemorrhage, mass, midline
shift, or extra-axial fluid collection is identified. No significant
white matter disease is seen for age. The ventricles and sulci are
normal.

Vascular: Major intracranial vascular flow voids are preserved.

Skull and upper cervical spine: Unremarkable bone marrow signal para

Sinuses/Orbits: Bilateral cataract extraction. Small mucous
retention cysts in the maxillary sinuses. Clear mastoid air cells.

Other: None.
IMPRESSION: Mild restricted diffusion in the medial right frontal lobe, right
insula, and likely right hippocampus most consistent with recent
seizure activity.

## 2019-10-19 IMAGING — DX DG CHEST 1V PORT
1 series · 1 of 1 positions shown · non-contrast
Comparison: Chest radiograph dated [DATE].

CLINICAL DATA: 58-year-old female with left-sided weakness. Concern
for stroke.

EXAM:
PORTABLE CHEST 1 VIEW

[chest]
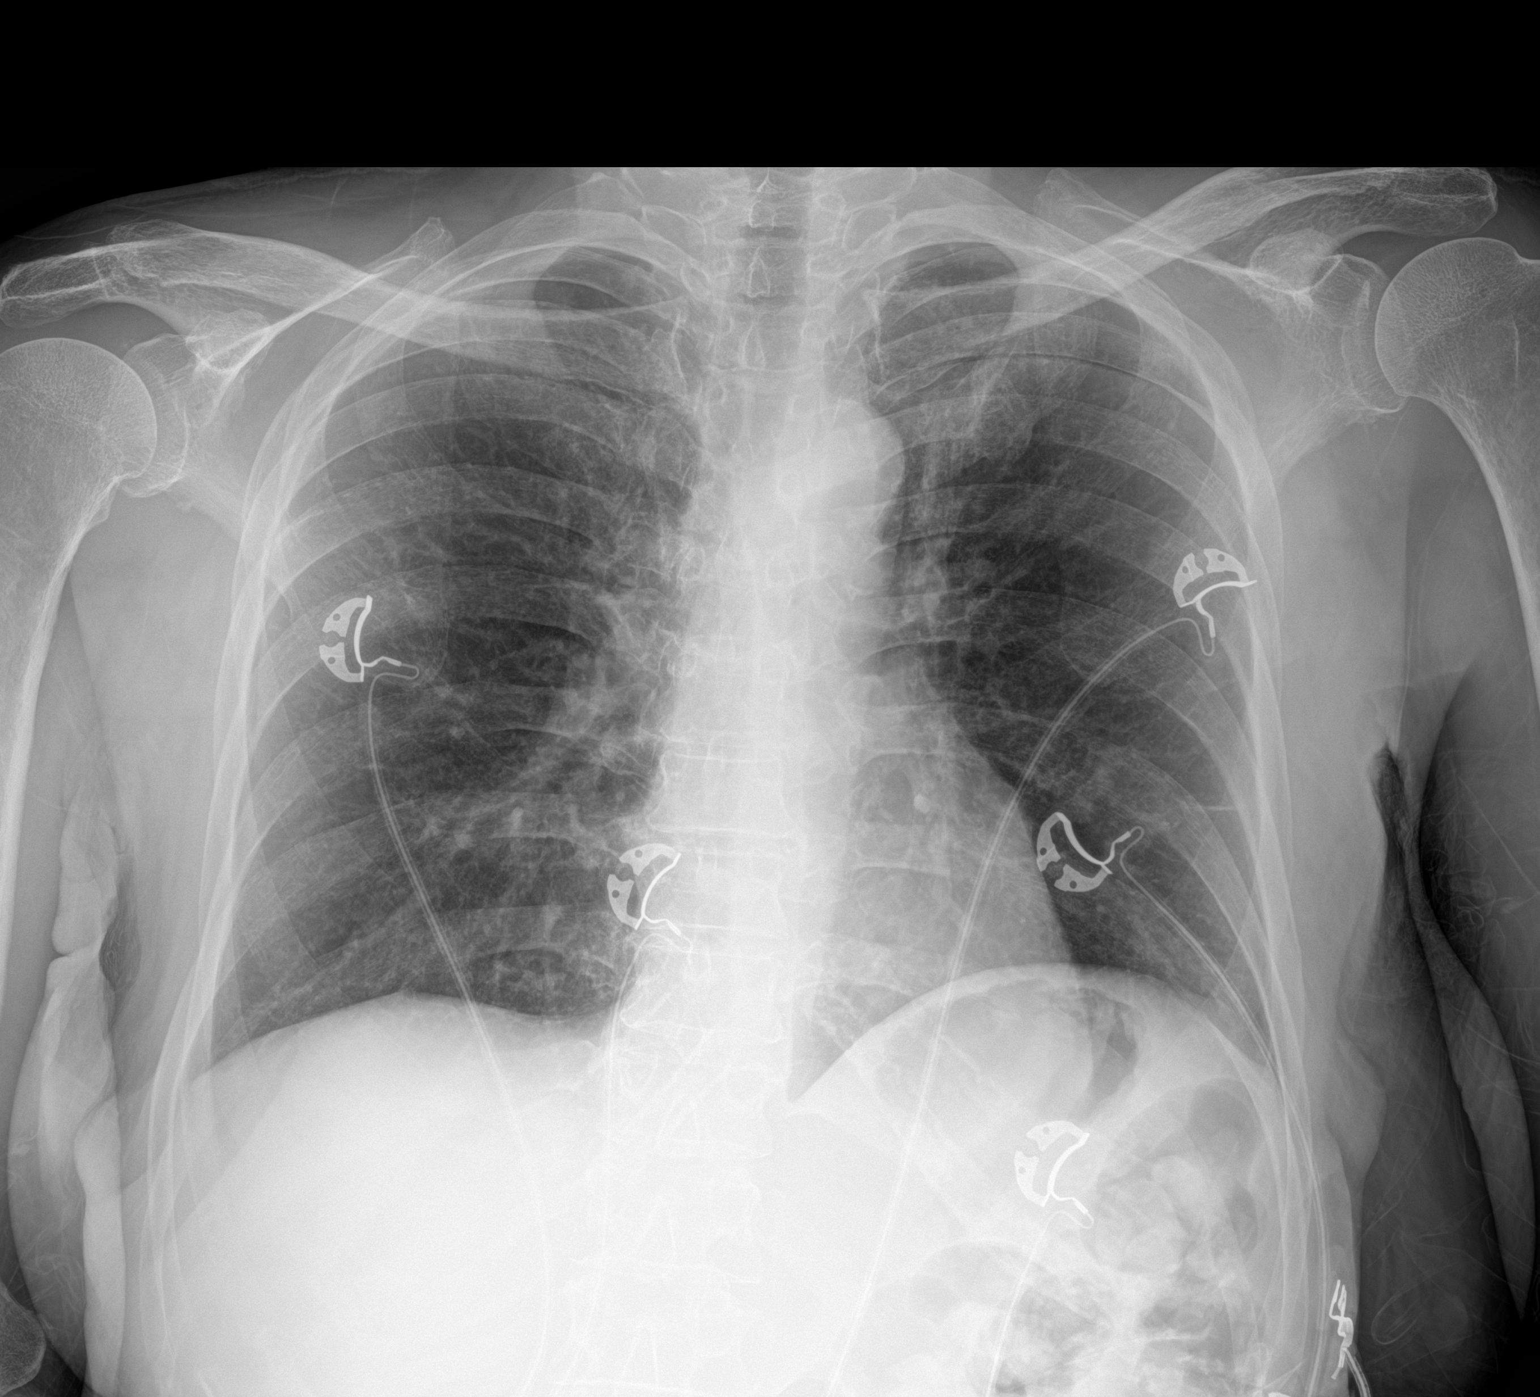

[1 of 1 positions shown; findings below may reference images not displayed]

FINDINGS: No focal consolidation, pleural effusion or pneumothorax. The
cardiac silhouette is within limits. No acute osseous pathology.
IMPRESSION: No active disease.

## 2019-10-19 IMAGING — CT CT HEAD CODE STROKE
3 series · 15 of 47 positions shown, 18 images · non-contrast
Comparison: None.

CLINICAL DATA: Code stroke.  Left-sided weakness.

EXAM:
CT HEAD WITHOUT CONTRAST
TECHNIQUE: Contiguous axial images were obtained from the base of the skull
through the vertex without intravenous contrast.

[Series 3: head 5.0 st · axial · 0.39mm/px · z∈[-142,-17]mm · 9 of 31 slices shown, 12 images]
[im 3/31  brain]
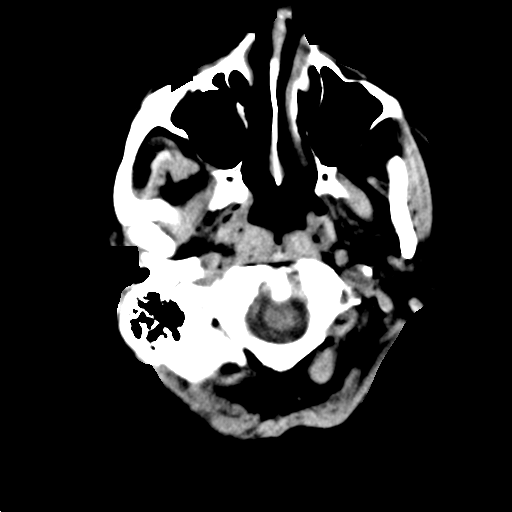
[im 3/31  bone]
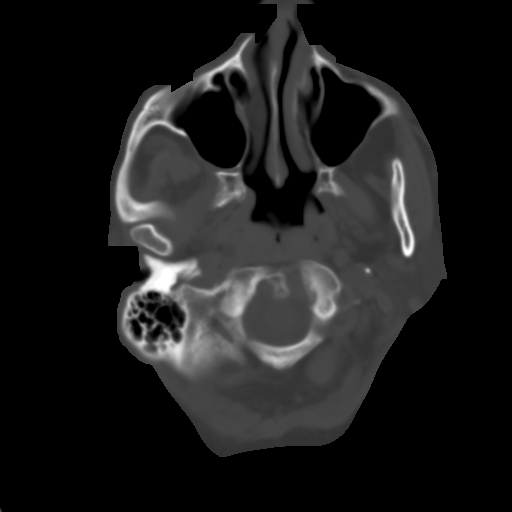
[im 6/31  brain]
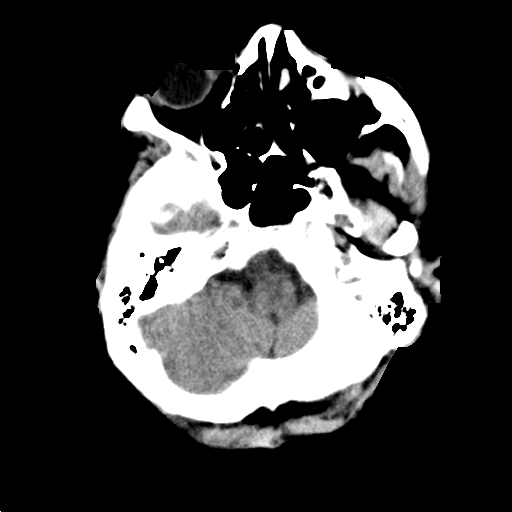
[im 9/31  brain]
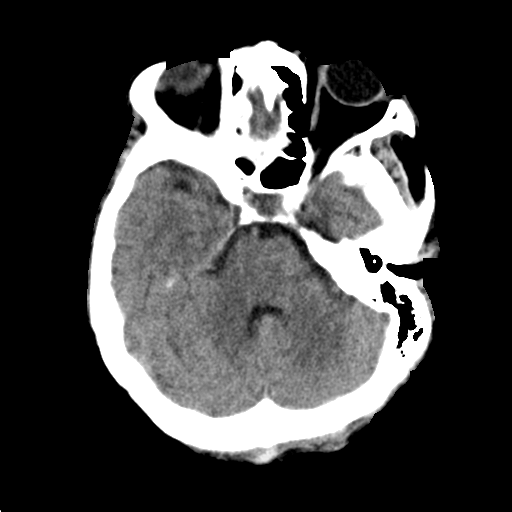
[im 12/31  brain]
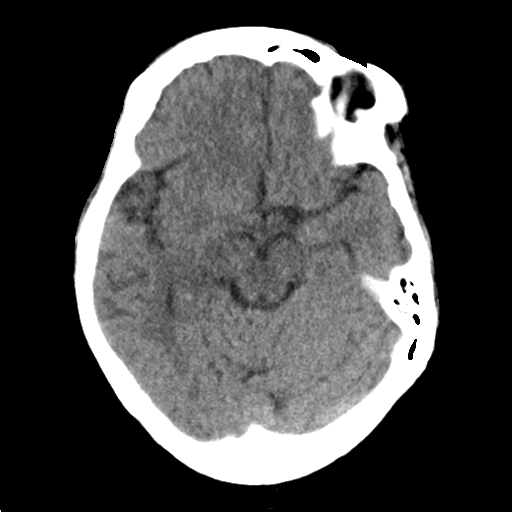
[im 16/31  brain]
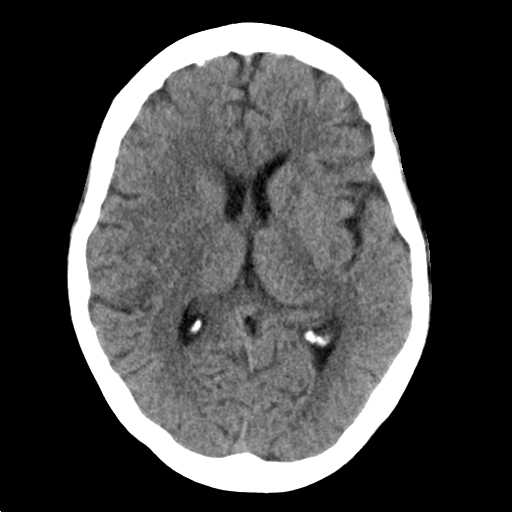
[im 16/31  bone]
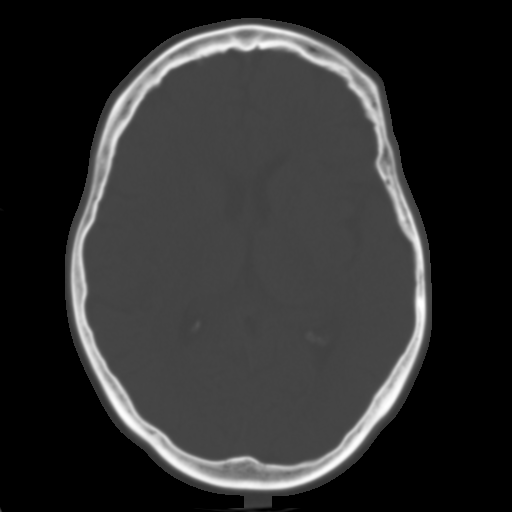
[im 19/31  brain]
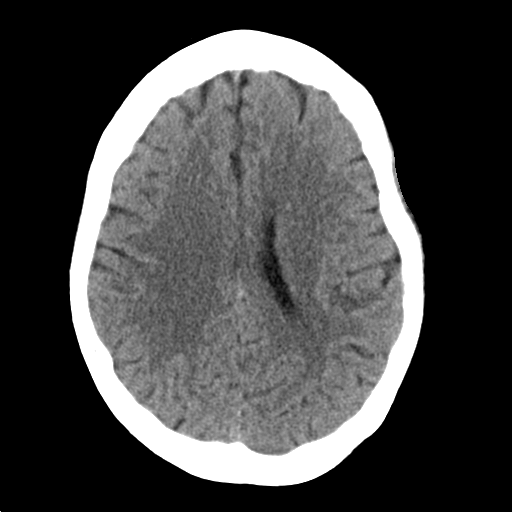
[im 22/31  brain]
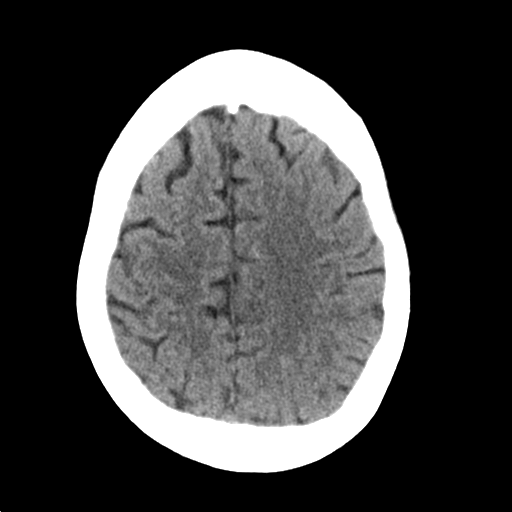
[im 25/31  brain]
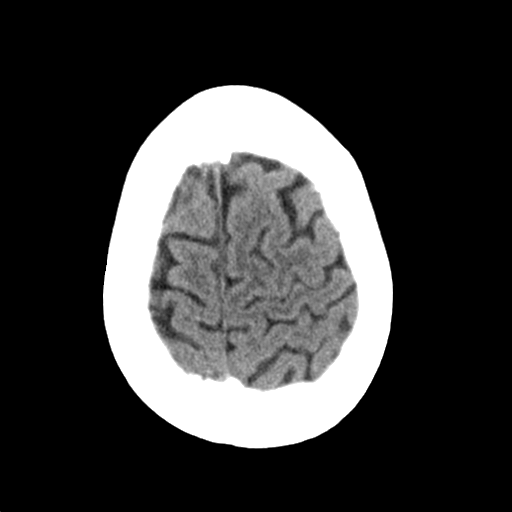
[im 28/31  brain]
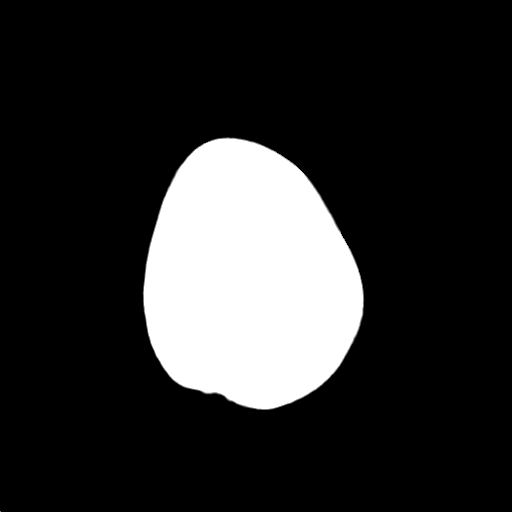
[im 28/31  bone]
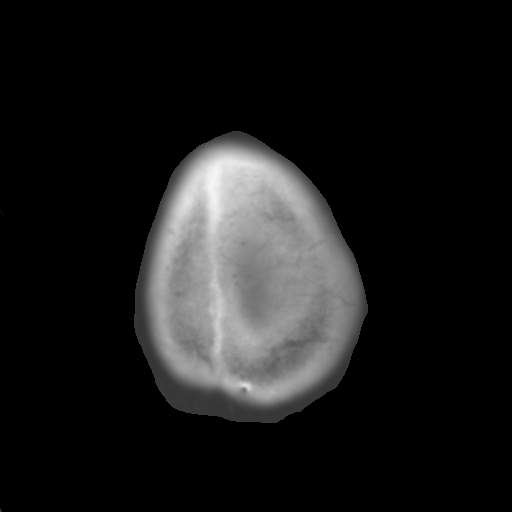

[Series 5: head 3.0 cor st · coronal · 0.30mm/px · 3 of 67 slices shown]
[im 23/67  brain]
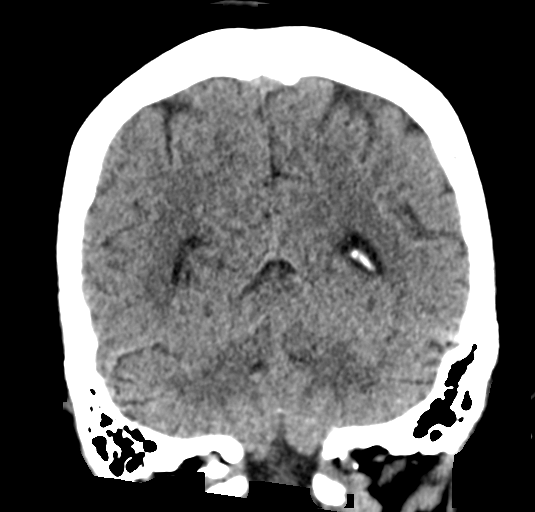
[im 30/67  brain]
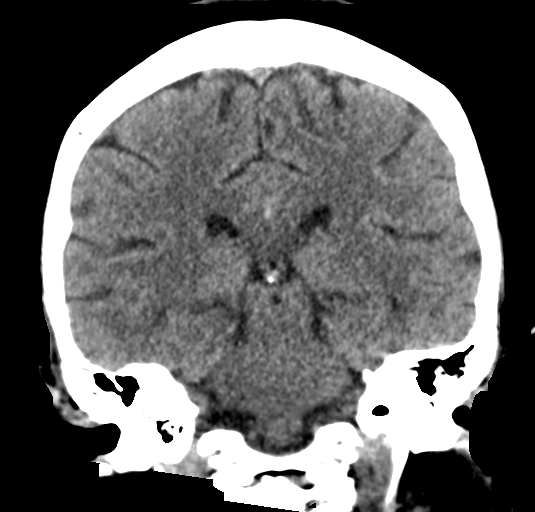
[im 37/67  brain]
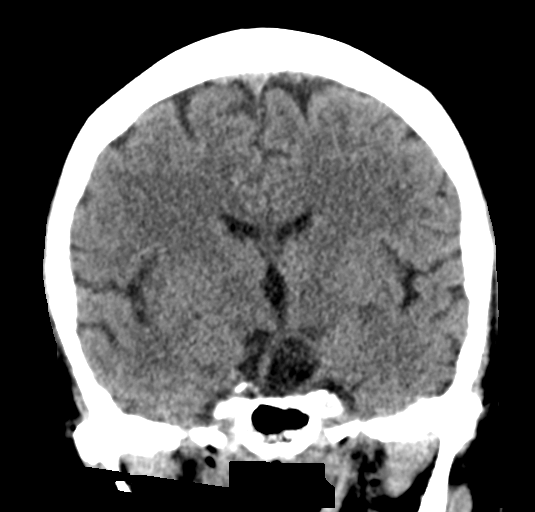

[Series 6: head 3.0 sag st · sagittal · 0.30mm/px · 3 of 55 slices shown]
[im 21/55  brain]
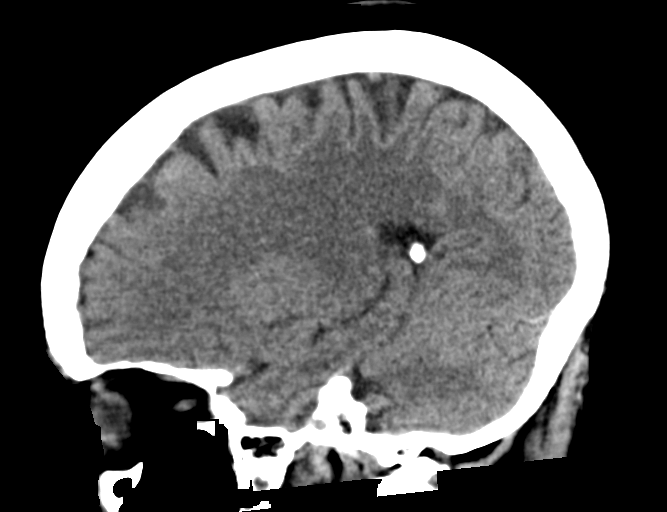
[im 28/55  brain]
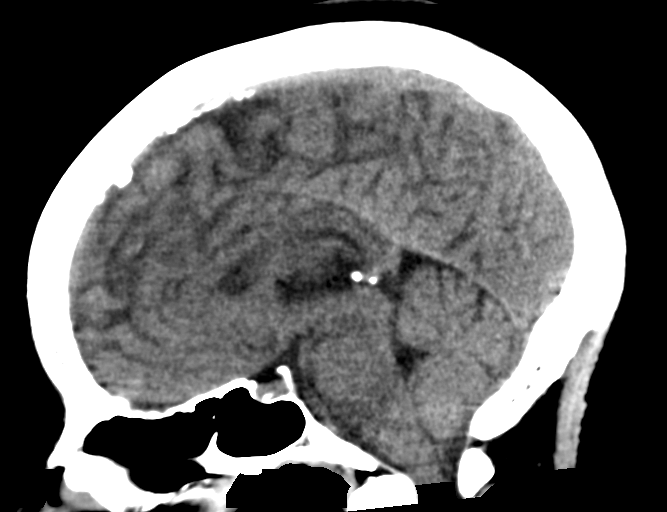
[im 34/55  brain]
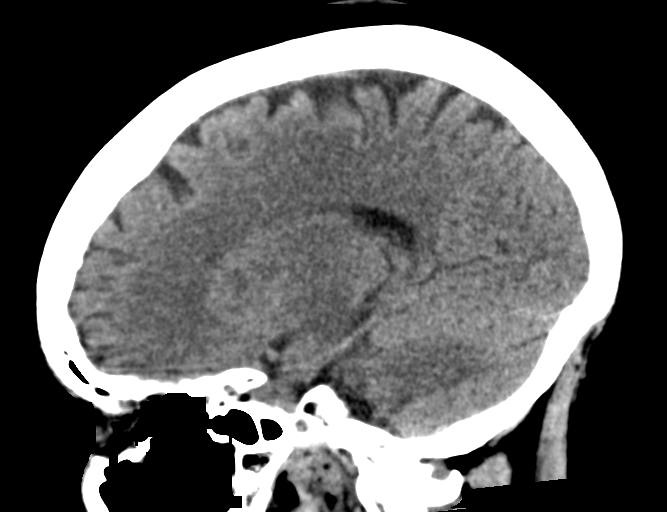

[15 of 47 positions shown; findings below may reference images not displayed]

FINDINGS: The study is mildly motion degraded.

Brain: Within limitations of motion, no acute infarct, intracranial
hemorrhage, mass, midline shift, or extra-axial fluid collection is
identified. The ventricles and sulci are normal.

Vascular: Calcified atherosclerosis at the skull base. No hyperdense
vessel.

Skull: No fracture or suspicious osseous lesion.

Sinuses/Orbits: Partially visualized mild mucosal thickening in the
left maxillary sinus. Bilateral cataract extraction.

Other: None.

ASPECTS ([place]berta Stroke Program Early CT Score)

- Ganglionic level infarction (caudate, lentiform nuclei, internal
capsule, insula, M1-M3 cortex): 7

- Supraganglionic infarction (M4-M6 cortex): 3

Total score (0-10 with 10 being normal): 10
IMPRESSION: 1. No evidence of acute intracranial abnormality within limitations
of mild motion.
2. ASPECTS is 10.

These results were communicated to Dr. NANOUK at [DATE] on [DATE]
by text page via the [employer] messaging system.

## 2019-10-19 MED ORDER — ONDANSETRON HCL 4 MG/2ML IJ SOLN
4.0000 mg | Freq: Four times a day (QID) | INTRAMUSCULAR | Status: DC | PRN
  Administered 2019-10-19: 4 mg via INTRAVENOUS
  Filled 2019-10-19: qty 2

## 2019-10-19 MED ORDER — SODIUM CHLORIDE 0.9% FLUSH
3.0000 mL | Freq: Once | INTRAVENOUS | Status: DC
Start: 2019-10-19 — End: 2019-11-12

## 2019-10-19 MED ORDER — DEXTROSE 5 % IV SOLN: INTRAVENOUS | Status: DC

## 2019-10-19 MED ORDER — DEXTROSE-NACL 5-0.9 % IV SOLN: INTRAVENOUS | Status: DC

## 2019-10-19 MED ORDER — LEVETIRACETAM IN NACL 500 MG/100ML IV SOLN
500.0000 mg | Freq: Two times a day (BID) | INTRAVENOUS | Status: DC
  Filled 2019-10-19: qty 100

## 2019-10-19 MED ORDER — ENOXAPARIN SODIUM 40 MG/0.4ML ~~LOC~~ SOLN
40.0000 mg | SUBCUTANEOUS | Status: DC
  Administered 2019-10-20: 40 mg via SUBCUTANEOUS
  Filled 2019-10-19: qty 0.4

## 2019-10-19 MED ORDER — SODIUM CHLORIDE 0.9 % IV BOLUS
500.0000 mL | Freq: Once | INTRAVENOUS | Status: AC
  Administered 2019-10-19: 500 mL via INTRAVENOUS

## 2019-10-19 MED ORDER — DEXTROSE 50 % IV SOLN
50.0000 mL | Freq: Once | INTRAVENOUS | Status: AC
  Administered 2019-10-19: 50 mL via INTRAVENOUS
  Filled 2019-10-19: qty 50

## 2019-10-19 MED ORDER — ACETAMINOPHEN 650 MG RE SUPP: 650.0000 mg | Freq: Four times a day (QID) | RECTAL | Status: DC | PRN

## 2019-10-19 MED ORDER — LEVETIRACETAM IN NACL 1000 MG/100ML IV SOLN
1000.0000 mg | Freq: Two times a day (BID) | INTRAVENOUS | Status: DC
  Administered 2019-10-20 (×2): 1000 mg via INTRAVENOUS
  Filled 2019-10-19 (×2): qty 100

## 2019-10-19 MED ORDER — DEXTROSE 50 % IV SOLN
INTRAVENOUS | Status: AC
  Administered 2019-10-19: 50 mL
  Filled 2019-10-19: qty 50

## 2019-10-19 MED ORDER — ACETAMINOPHEN 325 MG PO TABS
650.0000 mg | ORAL_TABLET | Freq: Four times a day (QID) | ORAL | Status: DC | PRN
  Administered 2019-11-11 (×3): 650 mg via ORAL
  Filled 2019-10-19 (×3): qty 2

## 2019-10-19 MED ORDER — HYDRALAZINE HCL 20 MG/ML IJ SOLN: 10.0000 mg | Freq: Four times a day (QID) | INTRAMUSCULAR | Status: DC | PRN

## 2019-10-19 MED ORDER — LEVOTHYROXINE SODIUM 75 MCG PO TABS: 150.0000 ug | ORAL_TABLET | Freq: Every morning | ORAL | Status: DC

## 2019-10-19 MED ORDER — POLYETHYLENE GLYCOL 3350 17 G PO PACK: 17.0000 g | PACK | Freq: Every day | ORAL | Status: DC | PRN

## 2019-10-19 MED ORDER — LEVETIRACETAM IN NACL 1000 MG/100ML IV SOLN
1000.0000 mg | INTRAVENOUS | Status: AC
  Administered 2019-10-19: 1000 mg via INTRAVENOUS
  Filled 2019-10-19: qty 100

## 2019-10-19 MED ORDER — POTASSIUM CHLORIDE 10 MEQ/100ML IV SOLN
10.0000 meq | INTRAVENOUS | Status: AC
  Administered 2019-10-19 – 2019-10-20 (×3): 10 meq via INTRAVENOUS
  Filled 2019-10-19 (×2): qty 100

## 2019-10-19 NOTE — ED Notes (Signed)
Ardyth Man MD D50  1/2amp.

## 2019-10-19 NOTE — Consult Note (Addendum)
NEUROLOGY CONSULT  Reason for Consult: code stroke Referring Physician: Dr. Zenia Resides, EDP  CC: unable  HPI: Andrea [name] is an 59 y.o. female with brittle DM, T1, HLD, thyroid disease. She was just in [facility] ER yesterday, 6/19, for hypoglycemia of 24, she was initially non-verbal with gen weakness at that time. This was corrected and she improved clinically, thus was sent home to f/u with her endocrinologist. Today, family called EMS when she again was hypoglycemic (glucolse 30 per EMS) and left side weakness was noted. Apparently, last seen normal was 0200. Family denies any seizure activity, nor any previous h/o seizures, but when EMS was on scene two focal seizures were witnessed. She was treated with 2mg  of IV Versed and 25g dextrose. Upon arrival, she was somnolent and not following commands, thus NIHSS was 17. By time [facility] was completed, NIHSS was 6. She has mild left side weakness, dysarthria. CTH showed no bleed or acute changes. Stat MRI brain was then done to r/o ischemic stroke.   Past Medical History Past Medical History:  Diagnosis Date   Diabetes mellitus without complication ([place])    Hyperlipidemia    Hypothyroidism    Thyroid disease     Past Surgical History Past Surgical History:  Procedure Laterality Date   INCISION AND DRAINAGE ABSCESS Right 06/15/2017   Procedure: INCISION AND DRAINAGE ABSCESS;  Surgeon: Clayburn Pert, MD;  Location: [facility] ORS;  Service: General;  Laterality: Right;    Family History Family History  Problem Relation Age of Onset   Heart disease Father    Breast cancer Neg Hx     Social History    reports that she has never smoked. She has never used smokeless tobacco. She reports that she does not drink alcohol and does not use drugs.  Allergies Allergies  Allergen Reactions   Latex Hives    Home Medications (Not in a hospital admission)   Hospital Medications  sodium chloride flush  3 mL Intravenous Once      ROS: unable   Physical Examination:  There were no vitals filed for this visit.  General - frail, much older than stated appearing, critically ill  Heart - Regular rate and rhythm - no murmer Lungs - Clear to auscultation Abdomen - Soft - non tender Extremities - Distal pulses intact - no edema Skin - Warm and dry, flaking  Neurologic Examination:   Mental Status:  Somnolent, did not follow commands, except to track, briefly attend. Gradually she was able to follow some simple commands, speech quite dysarthric and only able to orient to name. Cranial Nerves:  II-bilateral visual fields intact to blink to threat III/IV/VI-Pupils were equal and reacted. Extraocular movements were full.  V/VII-no facial numbness and mild left facial weakness.  VIII-hearing normal.  X-normal speech and symmetrical palatal movement.  XII-midline tongue extension  Motor: Right : Upper extremity   5/5    Left:     Upper extremity   3/5  Lower extremity   3/5     Lower extremity   3/5 Tone and bulk:normal tone throughout; no atrophy noted Sensory: grimaces to  touch in all extremities. Deep Tendon Reflexes: 1/4 throughout Plantars: mute bilat  Cerebellar: unable to test Gait: not tested NIHSS 1a Level of Conscious.: 1 1b LOC Questions: 2 1c LOC Commands: 2 2 Best Gaze: 0 3 Visual: 0 4 Facial Palsy: 1 5a Motor Arm - Left:3 5b Motor Arm - Right: 0 6a Motor Leg - Left: 3 6b Motor Leg -  Right: 3 7 Limb Ataxia: 0 8 Sensory: 0 9 Best Language: 0 10 Dysarthria: 2 11 Extinct. and Inatten.: 0 TOTAL: 17  LABORATORY STUDIES:  Basic Metabolic Panel: Recent Labs  Lab 10/18/19 1501 10/19/19 1539  NA 146* 144  K 3.0* 3.1*  CL 102 105  CO2 30  --   GLUCOSE 30* 154*  BUN 9 5*  CREATININE 0.63 0.50  CALCIUM 9.8  --     Liver Function Tests: Recent Labs  Lab 10/18/19 1501  AST 30  ALT 14  ALKPHOS 71  BILITOT 0.8  PROT 7.8  ALBUMIN 3.4*   No results for input(s): LIPASE,  AMYLASE in the last 168 hours. No results for input(s): AMMONIA in the last 168 hours.  CBC: Recent Labs  Lab 10/18/19 1501 10/19/19 1539 10/19/19 1541  WBC 9.2  --  8.9  NEUTROABS 7.5  --  6.8  HGB 12.5 10.2* 10.2*  HCT 35.6* 30.0* 31.4*  MCV 82.8  --  88.7  PLT 377  --  329    Cardiac Enzymes: No results for input(s): CKTOTAL, CKMB, CKMBINDEX, TROPONINI in the last 168 hours.  BNP: Invalid input(s): POCBNP  CBG: Recent Labs  Lab 10/18/19 1531 10/18/19 1626 10/18/19 1808 10/18/19 2005 10/19/19 1536  GLUCAP 228* 124* 93 143* 131*    Microbiology:   Coagulation Studies: No results for input(s): LABPROT, INR in the last 72 hours.  Urinalysis: No results for input(s): COLORURINE, LABSPEC, PHURINE, GLUCOSEU, HGBUR, BILIRUBINUR, KETONESUR, PROTEINUR, UROBILINOGEN, NITRITE, LEUKOCYTESUR in the last 168 hours.  Invalid input(s): APPERANCEUR  Lipid Panel:  No results found for: CHOL, TRIG, HDL, CHOLHDL, VLDL, LDLCALC  HgbA1C:  Lab Results  Component Value Date   HGBA1C 11.6 (H) 06/15/2017    Urine Drug Screen:  No results found for: LABOPIA, COCAINSCRNUR, LABBENZ, AMPHETMU, THCU, LABBARB   Alcohol Level:  No results for input(s): ETH in the last 168 hours.  Miscellaneous labs:  EKG  EKG  IMAGING: CT HEAD CODE STROKE WO CONTRAST  Result Date: 10/19/2019 CLINICAL DATA:  Code stroke.  Left-sided weakness. EXAM: CT HEAD WITHOUT CONTRAST TECHNIQUE: Contiguous axial images were obtained from the base of the skull through the vertex without intravenous contrast. COMPARISON:  None. FINDINGS: The study is mildly motion degraded. Brain: Within limitations of motion, no acute infarct, intracranial hemorrhage, mass, midline shift, or extra-axial fluid collection is identified. The ventricles and sulci are normal. Vascular: Calcified atherosclerosis at the skull base. No hyperdense vessel. Skull: No fracture or suspicious osseous lesion. Sinuses/Orbits: Partially  visualized mild mucosal thickening in the left maxillary sinus. Bilateral cataract extraction. Other: None. ASPECTS [facility] Stroke Program Early CT Score) - Ganglionic level infarction (caudate, lentiform nuclei, internal capsule, insula, M1-M3 cortex): 7 - Supraganglionic infarction (M4-M6 cortex): 3 Total score (0-10 with 10 being normal): 10 IMPRESSION: 1. No evidence of acute intracranial abnormality within limitations of mild motion. 2. ASPECTS is 10. These results were communicated to Dr. Rory Percy at 3:50 pm on 10/19/2019 by text page via the [facility] messaging system. Electronically Signed   By: Logan Bores M.D.   On: 10/19/2019 15:50     Assessment/Plan: 59yr old who is quite debilitated at baseline with brittle T1 dm, HLD, hypothyroid, presents with hypoglycemia of 30 and seizures with left Todds paralysis.   1. Hypoglycemia- per EMS 30 upon arrival, 25g dextrose given, 113 upon arrival to ER 2. T1 DM, uncontrolled- A1c 11.9. Pt clearly needs some education or insulin assistance to manage her care 3.  Seizures- likely provoked by severe hypoglycemia. (6/19 she was here for glucose of 24, today 30.) No utilitiy in ASD at this time, main thing is to fix the underlying hypoglycemia. 4. Encephalopathy and Todds left paralysis- d/t seizure and post ictal state. This should improve. Stat CTH and MRI neg for stroke.  RECOMMENDATIONS Manage blood sugars Correct electrolytes Supportive care Load with Keppra 1g IV now Keppra 500 BID going forward Will call tech for EEG to r/o status epilepticus  Desiree Metzger-Cihelka, ARNP-C, ANVP-BC Pager: [phone]   Attending addendum Patient seen and examined as a code stroke Last known normal 2 AM on 10/19/2019 brought in with left-sided weakness. Prior visit to [facility] yesterday with hypoglycemia. Today also patient's blood sugars Onsite-40. Brought in, evaluated in the emergency room [place] the bridge, taken for stat CT head which is  unremarkable for acute process. Continue to demonstrate left-sided weakness as demonstrated in the exam above which I independently verified. Likely to be hypoglycemia induced current presentation, I proceeded to obtain an MRI to ensure an acute stroke is not missed. MRI of the brain without contrast completed-reviewed independently. Shows restricted diffusion in the medial right frontal lobe right insular and likely right hippocampus most consistent with recent seizure activity.  Agree with the assessment and recommendations above which I have formulated and discussed with the APP.   -- Amie Portland, MD [facility] Pager: [phone] If 7pm to 7am, please call on call as listed on AMION.   CRITICAL CARE ATTESTATION Performed by: Amie Portland, MD Total critical care time: 55 minutes Critical care time was exclusive of separately billable procedures and treating other patients and/or supervising APPs/Residents/Students Critical care was necessary to treat or prevent imminent or life-threatening deterioration due to strokelike symptoms,*uppercase, seizures, hypoglycemia This patient is critically ill and at significant risk for neurological worsening and/or death and care requires constant monitoring. Critical care was time spent personally by me on the following activities: development of treatment plan with patient and/or surrogate as well as nursing, discussions with consultants, evaluation of patient's response to treatment, examination of patient, obtaining history from patient or surrogate, ordering and performing treatments and interventions, ordering and review of laboratory studies, ordering and review of radiographic studies, pulse oximetry, re-evaluation of patient's condition, participation in multidisciplinary rounds and medical decision making of high complexity in the care of this patient.

## 2019-10-19 NOTE — Procedures (Addendum)
Patient Name: Andrea [name]  MRN: [id number]  Epilepsy Attending: Lora Havens  Referring Physician/Provider: Dr Amie Portland Date: 10/19/2019 Duration: 30.57 mins  Patient history: 59yo F who presented with ams and seizure. EEG to evaluate for seizure,   Level of alertness: Awake  AEDs during EEG study:  keppra  Technical aspects: This EEG study was done with scalp electrodes positioned according to the 10-20 International system of electrode placement. Electrical activity was acquired at a sampling rate of 500Hz  and reviewed with a high frequency filter of 70Hz  and a low frequency filter of 1Hz . EEG data were recorded continuously and digitally stored.   Description: No posterior dominant rhythm was seen. EEG showed generalize, maximal bifrontal periodic epileptiform discharges with triphasic morphology at 0.25-0.5Hz . EEG also showed continuous generalized 8-9Hz  alpha activity.  Hyperventilation and photic stimulation were not performed.     ABNORMALITY -Periodic epileptiform discharges with triphasic morphology, generalized, maximal bifrontal  IMPRESSION: This study showed evidence of generalized epileptogenicity with epileptiform discharges at 0.25-0.5hz  which is on the ictal-interictal continuum. No definite seizures were seen throughout the recording.   Dr Cheral Marker was notified.   [name]

## 2019-10-19 NOTE — ED Triage Notes (Signed)
Pt BIB [facility] for eval of stroke like sx. EMS was called out by family who had been trying to "keep her blood sugar up all day". Family reports that they noted that she had L sided weakness w/ L sided facial droop onset around 0200. On EMS arrival they noted L sided deficits and found her CBG to be 30. Pt given 25 of dextrose w/ improvement of CBG to 131 on arrival to ED. Pt w/ persistent L sided deficits and facial droop. Taken to CT as Code Stroke w/ MD [employer]

## 2019-10-19 NOTE — Progress Notes (Signed)
Stat EEG completed, results pending.  Dr. Hortense Ramal and Dr Cheral Marker reviewed EEG.  LTM ordered and initiated.  Electrodes secured with tape and head was wrapped with gauze bandage. No skin break down noted.  Grandson and RN educated regarding the pt event button.  RN educated regarding moving.

## 2019-10-19 NOTE — H&P (Addendum)
History and Physical  Andrea [name] JJH:[id number] DOB: 10-21-1960 DOA: 10/19/2019  PCP: [name], MD Patient coming from:   I have personally briefly reviewed patient's old medical records in [place]   Chief Complaint: Left-sided weakness, dysarthria  HPI: Andrea [name] is a 59 y.o. female with past medical history significant for diabetes insulin-dependent, hyperlipidemia, hypothyroidism, who was just evaluated at [facility], ER on 6/19 for hypoglycemia of 24, patient was initially nonverbal with generalized weakness at that time.  Hypoglycemia was corrected and she was discharged home.  Today family called EMS because patient's blood sugar was again low at 30.  Patient was also noted to have  left-sided weakness.  Patient was last seen normal at 2 AM.  When EMS was on the scene patient had 2 focal seizures.  Patient received IV Versed and glucose.  On arrival to the ED patient had still mild left-sided weakness and dysarthria.  Patient is lethargic, able to answer question.  She was able to tell me she was at the hospital, her name.  Her speech is not clear.  She was able to tell me that her NPH was increased recently.  Her blood sugar last night was down again to the 40s, grandson gave her peanut butter and fruits and subsequently increased to 56.  The last time that she use her 70-30 was on 6/19 morning. She use 80 units of 70/30.  She denies abdominal pain, diarrhea, denies poor oral intake.  She had an episode of nausea while I was evaluating her and she is started to vomit, she was placed on fetal position and she vomited a small amount of clear  Fluid.  Her blood sugar during my evaluation was a 65.  I instructed nurse to give patient half an amp of D-50.  Evaluation in the ED: Sodium 143, potassium 3.2, CO2 24, glucose 155, alkaline phosphatase 59, albumin 2.6, AST ALT normal, white blood cell 8.9, hemoglobin 10.2, platelet 329.  INR 1.1, CT head: Acute abnormality.  MRI  without contrast: Mild restricted diffusion in the medial right frontal lobe, right insula, and likely right hippocampus most consistent with recent seizure activity.   Review of Systems: All systems reviewed and apart from history of presenting illness, are negative.  Past Medical History:  Diagnosis Date  . Diabetes mellitus without complication ([place])   . Hyperlipidemia   . Hypothyroidism   . Thyroid disease    Past Surgical History:  Procedure Laterality Date  . INCISION AND DRAINAGE ABSCESS Right 06/15/2017   Procedure: INCISION AND DRAINAGE ABSCESS;  Surgeon: Clayburn Pert, MD;  Location: [facility] ORS;  Service: General;  Laterality: Right;   Social History:  reports that she has never smoked. She has never used smokeless tobacco. She reports that she does not drink alcohol and does not use drugs.   Allergies  Allergen Reactions  . Latex Hives    Family History  Problem Relation Age of Onset  . Heart disease Father   . Breast cancer Neg Hx     Prior to Admission medications   Medication Sig Start Date End Date Taking? Authorizing Provider  aspirin EC 81 MG tablet Take 81 mg by mouth daily.    [provider]  Cholecalciferol 2000 units TABS Take 2,000 Units by mouth 2 (two) times daily.    [provider]  insulin NPH-regular Human (NOVOLIN 70/30) (70-30) 100 UNIT/ML injection Inject 34 Units into the skin 2 (two) times daily before a  meal.    [provider]  levonorgestrel (MIRENA) 20 MCG/24HR IUD by Intrauterine route.    [provider]  levothyroxine (SYNTHROID, LEVOTHROID) 88 MCG tablet Take 88 mcg by mouth daily before breakfast.    [provider]  lisinopril (PRINIVIL,ZESTRIL) 20 MG tablet Take 20 mg by mouth daily.    [provider]  neomycin-polymyxin-hydrocortisone (CORTISPORIN) 3.5-10000-1 OTIC suspension Place 4 drops into the left ear 3 (three) times daily. X 5 days 08/16/19   Karen Kitchens, NP  potassium  chloride SA (KLOR-CON) 20 MEQ tablet Take 1 tablet (20 mEq total) by mouth daily. 10/08/19   Earleen Newport, MD  rosuvastatin (CRESTOR) 20 MG tablet Take by mouth. 08/04/19   [provider]  tolterodine (DETROL LA) 4 MG 24 hr capsule Take 1 capsule (4 mg total) by mouth daily. 10/08/19 10/07/20  Earleen Newport, MD  pravastatin (PRAVACHOL) 40 MG tablet Take 40 mg by mouth every evening.   08/16/19  [provider]   Physical Exam: There were no vitals filed for this visit.   General exam: Moderately built and nourished patient, lying comfortably supine on the gurney in no obvious distress.  Head, eyes and ENT: Nontraumatic and normocephalic. Pupils equally reacting to light and accommodation. Oral mucosa moist.  Neck: Supple. No JVD, carotid bruit or thyromegaly.  Lymphatics: No lymphadenopathy.  Respiratory system: Clear to auscultation. No increased work of breathing.  Cardiovascular system: S1 and S2 heard, RRR. No JVD, murmurs, gallops, clicks or pedal edema.  Gastrointestinal system: Abdomen is nondistended, soft and nontender. Normal bowel sounds heard. No organomegaly or masses appreciated.  Central nervous system: Alert and oriented. No focal neurological deficits.  Extremities: Symmetric 5 x 5 power. Peripheral pulses symmetrically felt.   Skin: No rashes or acute findings.  Musculoskeletal system: Negative exam.  Psychiatry: Pleasant and cooperative.   Labs on Admission:  Basic Metabolic Panel: Recent Labs  Lab 10/18/19 1501 10/19/19 1539 10/19/19 1541  NA 146* 144 143  K 3.0* 3.1* 3.2*  CL 102 105 108  CO2 30  --  24  GLUCOSE 30* 154* 155*  BUN 9 5* 6  CREATININE 0.63 0.50 0.76  CALCIUM 9.8  --  8.7*   Liver Function Tests: Recent Labs  Lab 10/18/19 1501 10/19/19 1541  AST 30 31  ALT 14 15  ALKPHOS 71 59  BILITOT 0.8 0.7  PROT 7.8 6.6  ALBUMIN 3.4* 2.6*   No results for input(s): LIPASE, AMYLASE in the last 168 hours. No  results for input(s): AMMONIA in the last 168 hours. CBC: Recent Labs  Lab 10/18/19 1501 10/19/19 1539 10/19/19 1541  WBC 9.2  --  8.9  NEUTROABS 7.5  --  6.8  HGB 12.5 10.2* 10.2*  HCT 35.6* 30.0* 31.4*  MCV 82.8  --  88.7  PLT 377  --  329   Cardiac Enzymes: No results for input(s): CKTOTAL, CKMB, CKMBINDEX, TROPONINI in the last 168 hours.  BNP (last 3 results) No results for input(s): PROBNP in the last 8760 hours. CBG: Recent Labs  Lab 10/18/19 1626 10/18/19 1808 10/18/19 2005 10/19/19 1536 10/19/19 1826  GLUCAP 124* 93 143* 131* 62*    Radiological Exams on Admission: MR BRAIN WO CONTRAST  Result Date: 10/19/2019 CLINICAL DATA:  Stroke follow-up. Left-sided weakness. Hypoglycemia. Witnessed seizures by EMS. Clinically suspected Todd's paralysis. EXAM: MRI HEAD WITHOUT CONTRAST TECHNIQUE: Multiplanar, multiecho pulse sequences of the brain and surrounding structures were obtained without intravenous contrast. COMPARISON:  Head CT 10/19/2019 FINDINGS: The study is mildly motion degraded. Brain: There is mild restricted diffusion involving cortex in the medial right frontal lobe, right insula, and likely right hippocampus which does not conform to a vascular territory and is without associated edema. No intracranial hemorrhage, mass, midline shift, or extra-axial fluid collection is identified. No significant white matter disease is seen for age. The ventricles and sulci are normal. Vascular: Major intracranial vascular flow voids are preserved. Skull and upper cervical spine: Unremarkable bone marrow signal para Sinuses/Orbits: Bilateral cataract extraction. Small mucous retention cysts in the maxillary sinuses. Clear mastoid air cells. Other: None. IMPRESSION: Mild restricted diffusion in the medial right frontal lobe, right insula, and likely right hippocampus most consistent with recent seizure activity. Electronically Signed   By: Logan Bores M.D.   On: 10/19/2019 16:48    CT HEAD CODE STROKE WO CONTRAST  Result Date: 10/19/2019 CLINICAL DATA:  Code stroke.  Left-sided weakness. EXAM: CT HEAD WITHOUT CONTRAST TECHNIQUE: Contiguous axial images were obtained from the base of the skull through the vertex without intravenous contrast. COMPARISON:  None. FINDINGS: The study is mildly motion degraded. Brain: Within limitations of motion, no acute infarct, intracranial hemorrhage, mass, midline shift, or extra-axial fluid collection is identified. The ventricles and sulci are normal. Vascular: Calcified atherosclerosis at the skull base. No hyperdense vessel. Skull: No fracture or suspicious osseous lesion. Sinuses/Orbits: Partially visualized mild mucosal thickening in the left maxillary sinus. Bilateral cataract extraction. Other: None. ASPECTS [facility] Stroke Program Early CT Score) - Ganglionic level infarction (caudate, lentiform nuclei, internal capsule, insula, M1-M3 cortex): 7 - Supraganglionic infarction (M4-M6 cortex): 3 Total score (0-10 with 10 being normal): 10 IMPRESSION: 1. No evidence of acute intracranial abnormality within limitations of mild motion. 2. ASPECTS is 10. These results were communicated to Dr. Rory Percy at 3:50 pm on 10/19/2019 by text page via the [facility] messaging system. Electronically Signed   By: Logan Bores M.D.   On: 10/19/2019 15:50    EKG: Independently reviewed. Sinus tachycardia  Assessment/Plan Active Problems:   Type 1 diabetes mellitus with other specified complication (HCC)   Seizure (HCC)   Hypoglycemia   Hypokalemia  1-Acute Encephalopathy; probably related to hypoglycemia, vs Seizure; Admit to progressive care unit.  Support care.  Check CBG every 2 hours.   2-Seizures;   Started on Keppra.  Plan for EEG tonight.   3-Hypoglycemia;  Patient received 80 units of 70/30 on 6/19 morning.  Unclear when her insulin regimen was increased.  IV fluid, D 5.   4-Diabetes Type 1; Hypoglycemia She has been having Low blood sugar,  symptomatics.  Continue to hold insulin. No evidence of acidosis or increase gap.  Due to severe low blood sugar, will not start long acting insulin. Will need to monitor closely Bmet and if cbg normalize will need to start long acting insulin  -Monitor CBG evey 2 hours.  -hold 70/30/   5-Nausea; zofran PRN.  Might be related to hypoglycemia.   6-Hypokalemia;  Replete IV.   7-Hypothyroidism; continue with synthroid.   Others; awaiting Medications reconciliation for chronic meds.   DVT Prophylaxis: Lovenox Code Status: Full code Family Communication: care discussed with grandson Disposition Plan: admit under observation, Need to monitor CBG, rule out seizures.   Time spent: 70 minutes   Elmarie Shiley MD [facility]   10/19/2019, 6:52 PM

## 2019-10-19 NOTE — ED Notes (Signed)
Paged Dr. Claria Dice

## 2019-10-19 NOTE — Progress Notes (Signed)
EEG with [facility]EDs. Patient clinically improved but not yet back to baseline. Has intermittent chewing movements of left side of mouth.   BP 131/76 Comment: room air  Pulse (!) 111 Comment: room air  Resp (!) 25 Comment: room air  Exam: Ment: Able to converse and describe her symptoms. Speech output is sparse. CN: Fixates normally. Mild lag on the left with grimace.  Motor: Moves all 4 extremities. Diffusely weak.    A/R: 59 yearold female with hypoglycemic seizures and left Todd's paralysis.  -- Will administer a supplemental load of Keppra 1000 mg IV and increase her scheduled dose to 1000 BID -- Converting spot EEG to LTM -- Discussed with Dr. Hortense Ramal  Electronically signed: Dr. Kerney Elbe

## 2019-10-19 NOTE — ED Provider Notes (Signed)
[place] EMERGENCY DEPARTMENT Provider Note   CSN: [id number] Arrival date & time: 10/19/19  1533  An emergency department physician performed an initial assessment on this suspected stroke patient at 1534.  History Chief Complaint  Patient presents with  . Code Stroke    Carolee Channell is a 59 y.o. female.  59 year old female presents after being called a code stroke due to left-sided weakness mostly on her left arm which began today.  Symptoms started around 2:00 in the morning and family has noted some left-sided facial droop.  Patient states she may have had a seizure as she had some loss of bladder function.  She was seen at [facility] for hypoglycemia.  Patient continues to take her insulin despite having low blood sugars.  She denies any headache.  No tongue biting.  EMS today found the patient have a CBG of 30 and was given dextrose with improvement of her sugar to 131.        Past Medical History:  Diagnosis Date  . Diabetes mellitus without complication ([place])   . Hyperlipidemia   . Hypothyroidism   . Thyroid disease     Patient Active Problem List   Diagnosis Date Noted  . Back pain 06/14/2017  . Fibroids 06/14/2017  . 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 Hypertension associated with diabetes ([place]) 06/14/2017  . Hypothyroid 06/14/2017  . Menometrorrhagia 06/14/2017  . Ovarian failure 09/22/2016  . Colon polyps 10/29/2014  . Left foot pain 04/15/2014  . Abnormal uterine bleeding 02/24/2014  . Cervical polyp 10/16/2013  . High risk medication use 10/16/2013  . Hyperlipidemia 10/16/2013  . Increased frequency of urination 10/16/2013  . Diabetes mellitus type 1 ([place]) 01/06/2013  . Diabetic keto-acidosis ([place]) 01/01/2013  . Graves disease 01/01/2013  . Hyperglycemia 01/01/2013  . Urine ketones 01/01/2013  . Annual physical exam 08/15/2012  . Pigmented skin lesions 08/15/2012    Past Surgical History:  Procedure Laterality Date  .  INCISION AND DRAINAGE ABSCESS Right 06/15/2017   Procedure: INCISION AND DRAINAGE ABSCESS;  Surgeon: Clayburn Pert, MD;  Location: [facility] ORS;  Service: General;  Laterality: Right;     OB History   No obstetric history on file.     Family History  Problem Relation Age of Onset  . Heart disease Father   . Breast cancer Neg Hx     Social History   Tobacco Use  . Smoking status: Never Smoker  . Smokeless tobacco: Never Used  Vaping Use  . Vaping Use: Never used  Substance Use Topics  . Alcohol use: No  . Drug use: No    Home Medications Prior to Admission medications   Medication Sig Start Date End Date Taking? Authorizing Provider  aspirin EC 81 MG tablet Take 81 mg by mouth daily.    [provider]  Cholecalciferol 2000 units TABS Take 2,000 Units by mouth 2 (two) times daily.    [provider]  insulin NPH-regular Human (NOVOLIN 70/30) (70-30) 100 UNIT/ML injection Inject 34 Units into the skin 2 (two) times daily before a meal.    [provider]  levonorgestrel (MIRENA) 20 MCG/24HR IUD by Intrauterine route.    [provider]  levothyroxine (SYNTHROID, LEVOTHROID) 88 MCG tablet Take 88 mcg by mouth daily before breakfast.    [provider]  lisinopril (PRINIVIL,ZESTRIL) 20 MG tablet Take 20 mg by mouth daily.    [provider]  neomycin-polymyxin-hydrocortisone (CORTISPORIN) 3.5-10000-1 OTIC suspension Place 4 drops into the  left ear 3 (three) times daily. X 5 days 08/16/19   Karen Kitchens, NP  potassium chloride SA (KLOR-CON) 20 MEQ tablet Take 1 tablet (20 mEq total) by mouth daily. 10/08/19   Earleen Newport, MD  rosuvastatin (CRESTOR) 20 MG tablet Take by mouth. 08/04/19   [provider]  tolterodine (DETROL LA) 4 MG 24 hr capsule Take 1 capsule (4 mg total) by mouth daily. 10/08/19 10/07/20  Earleen Newport, MD  pravastatin (PRAVACHOL) 40 MG tablet Take 40 mg by mouth every evening.   08/16/19   [provider]    Allergies    Latex  Review of Systems   Review of Systems  All other systems reviewed and are negative.   Physical Exam Updated Vital Signs There were no vitals taken for this visit.  Physical Exam Vitals and nursing note reviewed.  Constitutional:      General: She is not in acute distress.    Appearance: Normal appearance. She is well-developed. She is not toxic-appearing.  HENT:     Head: Normocephalic and atraumatic.  Eyes:     General: Lids are normal.     Conjunctiva/sclera: Conjunctivae normal.     Pupils: Pupils are equal, round, and reactive to light.  Neck:     Thyroid: No thyroid mass.     Trachea: No tracheal deviation.  Cardiovascular:     Rate and Rhythm: Normal rate and regular rhythm.     Heart sounds: Normal heart sounds. No murmur heard.  No gallop.   Pulmonary:     Effort: Pulmonary effort is normal. No respiratory distress.     Breath sounds: Normal breath sounds. No stridor. No decreased breath sounds, wheezing, rhonchi or rales.  Abdominal:     General: Bowel sounds are normal. There is no distension.     Palpations: Abdomen is soft.     Tenderness: There is no abdominal tenderness. There is no rebound.  Musculoskeletal:        General: No tenderness. Normal range of motion.     Cervical back: Normal range of motion and neck supple.  Skin:    General: Skin is warm and dry.     Findings: No abrasion or rash.  Neurological:     Mental Status: She is alert and oriented to person, place, and time.     GCS: GCS eye subscore is 4. GCS verbal subscore is 5. GCS motor subscore is 6.     Cranial Nerves: No cranial nerve deficit.     Sensory: No sensory deficit.     Motor: No weakness or tremor.  Psychiatric:        Attention and Perception: Attention normal.        Mood and Affect: Affect is flat.        Behavior: Behavior is withdrawn.     ED Results / Procedures / Treatments   Labs (all labs ordered are listed,  but only abnormal results are displayed) Labs Reviewed  APTT - Abnormal; Notable for the following components:      Result Value   aPTT 22 (*)    All other components within normal limits  CBC - Abnormal; Notable for the following components:   RBC 3.54 (*)    Hemoglobin 10.2 (*)    HCT 31.4 (*)    All other components within normal limits  COMPREHENSIVE METABOLIC PANEL - Abnormal; Notable for the following components:   Potassium 3.2 (*)    Glucose, Bld 155 (*)  Calcium 8.7 (*)    Albumin 2.6 (*)    All other components within normal limits  I-STAT CHEM 8, ED - Abnormal; Notable for the following components:   Potassium 3.1 (*)    BUN 5 (*)    Glucose, Bld 154 (*)    Calcium, Ion 1.09 (*)    Hemoglobin 10.2 (*)    HCT 30.0 (*)    All other components within normal limits  CBG MONITORING, ED - Abnormal; Notable for the following components:   Glucose-Capillary 131 (*)    All other components within normal limits  SARS CORONAVIRUS 2 BY RT PCR (HOSPITAL ORDER, [place])  PROTIME-INR  DIFFERENTIAL  I-STAT BETA HCG BLOOD, ED ([facility], [facility], [facility] ONLY)    EKG None  Radiology CT HEAD CODE STROKE WO CONTRAST  Result Date: 10/19/2019 CLINICAL DATA:  Code stroke.  Left-sided weakness. EXAM: CT HEAD WITHOUT CONTRAST TECHNIQUE: Contiguous axial images were obtained from the base of the skull through the vertex without intravenous contrast. COMPARISON:  None. FINDINGS: The study is mildly motion degraded. Brain: Within limitations of motion, no acute infarct, intracranial hemorrhage, mass, midline shift, or extra-axial fluid collection is identified. The ventricles and sulci are normal. Vascular: Calcified atherosclerosis at the skull base. No hyperdense vessel. Skull: No fracture or suspicious osseous lesion. Sinuses/Orbits: Partially visualized mild mucosal thickening in the left maxillary sinus. Bilateral cataract extraction. Other: None. ASPECTS [facility] Stroke  Program Early CT Score) - Ganglionic level infarction (caudate, lentiform nuclei, internal capsule, insula, M1-M3 cortex): 7 - Supraganglionic infarction (M4-M6 cortex): 3 Total score (0-10 with 10 being normal): 10 IMPRESSION: 1. No evidence of acute intracranial abnormality within limitations of mild motion. 2. ASPECTS is 10. These results were communicated to Dr. Rory Percy at 3:50 pm on 10/19/2019 by text page via the [facility] messaging system. Electronically Signed   By: Logan Bores M.D.   On: 10/19/2019 15:50    Procedures Procedures (including critical care time)  Medications Ordered in ED Medications  sodium chloride flush (NS) 0.9 % injection 3 mL (has no administration in time range)    ED Course  I have reviewed the triage vital signs and the nursing notes.  Pertinent labs & imaging results that were available during my care of the patient were reviewed by me and considered in my medical decision making (see chart for details).    MDM Rules/Calculators/A&P                          Patient seen by neurology she was a code stroke.  Head CT without acute findings and MRI showed possible focus of seizure activity.  She will be placed on Keppra per neurology and they will order EEG.  They are requesting admission to the hospital for further work-up Final Clinical Impression(s) / ED Diagnoses Final diagnoses:  None    Rx / DC Orders ED Discharge Orders    None       Lacretia Leigh, MD 10/19/19 1641

## 2019-10-20 ENCOUNTER — Inpatient Hospital Stay (HOSPITAL_COMMUNITY): Payer: Medicaid Other

## 2019-10-20 ENCOUNTER — Other Ambulatory Visit: Payer: Self-pay

## 2019-10-20 DIAGNOSIS — E1069 Type 1 diabetes mellitus with other specified complication: Secondary | ICD-10-CM | POA: Diagnosis not present

## 2019-10-20 DIAGNOSIS — R000 Tachycardia, unspecified: Secondary | ICD-10-CM | POA: Diagnosis present

## 2019-10-20 DIAGNOSIS — Z515 Encounter for palliative care: Secondary | ICD-10-CM | POA: Diagnosis not present

## 2019-10-20 DIAGNOSIS — G8194 Hemiplegia, unspecified affecting left nondominant side: Secondary | ICD-10-CM | POA: Diagnosis present

## 2019-10-20 DIAGNOSIS — R471 Dysarthria and anarthria: Secondary | ICD-10-CM | POA: Diagnosis present

## 2019-10-20 DIAGNOSIS — Z20822 Contact with and (suspected) exposure to covid-19: Secondary | ICD-10-CM | POA: Diagnosis present

## 2019-10-20 DIAGNOSIS — E785 Hyperlipidemia, unspecified: Secondary | ICD-10-CM | POA: Diagnosis present

## 2019-10-20 DIAGNOSIS — R414 Neurologic neglect syndrome: Secondary | ICD-10-CM | POA: Diagnosis present

## 2019-10-20 DIAGNOSIS — E039 Hypothyroidism, unspecified: Secondary | ICD-10-CM | POA: Diagnosis present

## 2019-10-20 DIAGNOSIS — N1330 Unspecified hydronephrosis: Secondary | ICD-10-CM | POA: Diagnosis present

## 2019-10-20 DIAGNOSIS — L819 Disorder of pigmentation, unspecified: Secondary | ICD-10-CM | POA: Diagnosis not present

## 2019-10-20 DIAGNOSIS — G9341 Metabolic encephalopathy: Secondary | ICD-10-CM | POA: Diagnosis present

## 2019-10-20 DIAGNOSIS — K633 Ulcer of intestine: Secondary | ICD-10-CM | POA: Diagnosis present

## 2019-10-20 DIAGNOSIS — Z7189 Other specified counseling: Secondary | ICD-10-CM | POA: Diagnosis not present

## 2019-10-20 DIAGNOSIS — R6881 Early satiety: Secondary | ICD-10-CM | POA: Diagnosis not present

## 2019-10-20 DIAGNOSIS — Z794 Long term (current) use of insulin: Secondary | ICD-10-CM | POA: Diagnosis not present

## 2019-10-20 DIAGNOSIS — K5730 Diverticulosis of large intestine without perforation or abscess without bleeding: Secondary | ICD-10-CM | POA: Diagnosis present

## 2019-10-20 DIAGNOSIS — Z8249 Family history of ischemic heart disease and other diseases of the circulatory system: Secondary | ICD-10-CM | POA: Diagnosis not present

## 2019-10-20 DIAGNOSIS — E162 Hypoglycemia, unspecified: Secondary | ICD-10-CM | POA: Diagnosis not present

## 2019-10-20 DIAGNOSIS — R569 Unspecified convulsions: Secondary | ICD-10-CM | POA: Diagnosis present

## 2019-10-20 DIAGNOSIS — G40901 Epilepsy, unspecified, not intractable, with status epilepticus: Secondary | ICD-10-CM | POA: Diagnosis present

## 2019-10-20 DIAGNOSIS — K648 Other hemorrhoids: Secondary | ICD-10-CM | POA: Diagnosis present

## 2019-10-20 DIAGNOSIS — E2839 Other primary ovarian failure: Secondary | ICD-10-CM | POA: Diagnosis not present

## 2019-10-20 DIAGNOSIS — Z87891 Personal history of nicotine dependence: Secondary | ICD-10-CM | POA: Diagnosis not present

## 2019-10-20 DIAGNOSIS — E43 Unspecified severe protein-calorie malnutrition: Secondary | ICD-10-CM | POA: Diagnosis present

## 2019-10-20 DIAGNOSIS — Z681 Body mass index (BMI) 19 or less, adult: Secondary | ICD-10-CM | POA: Diagnosis not present

## 2019-10-20 DIAGNOSIS — Z91040 Latex allergy status: Secondary | ICD-10-CM | POA: Diagnosis not present

## 2019-10-20 DIAGNOSIS — E10649 Type 1 diabetes mellitus with hypoglycemia without coma: Secondary | ICD-10-CM | POA: Diagnosis present

## 2019-10-20 DIAGNOSIS — K5641 Fecal impaction: Secondary | ICD-10-CM | POA: Diagnosis present

## 2019-10-20 DIAGNOSIS — R911 Solitary pulmonary nodule: Secondary | ICD-10-CM | POA: Diagnosis present

## 2019-10-20 DIAGNOSIS — K5289 Other specified noninfective gastroenteritis and colitis: Secondary | ICD-10-CM | POA: Diagnosis present

## 2019-10-20 DIAGNOSIS — E876 Hypokalemia: Secondary | ICD-10-CM | POA: Diagnosis present

## 2019-10-20 LAB — COMPREHENSIVE METABOLIC PANEL WITH GFR
ALT: 15 U/L (ref 0–44)
AST: 26 U/L (ref 15–41)
Albumin: 2.4 g/dL — ABNORMAL LOW (ref 3.5–5.0)
Alkaline Phosphatase: 62 U/L (ref 38–126)
Anion gap: 8 (ref 5–15)
BUN: <5 mg/dL — ABNORMAL LOW (ref 6–20)
CO2: 23 mmol/L (ref 22–32)
Calcium: 8.2 mg/dL — ABNORMAL LOW (ref 8.9–10.3)
Chloride: 111 mmol/L (ref 98–111)
Creatinine, Ser: 0.62 mg/dL (ref 0.44–1.00)
GFR calc Af Amer: >60 mL/min
GFR calc non Af Amer: >60 mL/min
Glucose, Bld: 111 mg/dL — ABNORMAL HIGH (ref 70–99)
Potassium: 3 mmol/L — ABNORMAL LOW (ref 3.5–5.1)
Sodium: 142 mmol/L (ref 135–145)
Total Bilirubin: 0.7 mg/dL (ref 0.3–1.2)
Total Protein: 5.9 g/dL — ABNORMAL LOW (ref 6.5–8.1)

## 2019-10-20 LAB — GLUCOSE, CAPILLARY
Glucose-Capillary: 125 mg/dL — ABNORMAL HIGH (ref 70–99)
Glucose-Capillary: 134 mg/dL — ABNORMAL HIGH (ref 70–99)

## 2019-10-20 LAB — CBG MONITORING, ED
Glucose-Capillary: 110 mg/dL — ABNORMAL HIGH (ref 70–99)
Glucose-Capillary: 82 mg/dL (ref 70–99)
Glucose-Capillary: 88 mg/dL (ref 70–99)
Glucose-Capillary: 75 mg/dL (ref 70–99)
Glucose-Capillary: 136 mg/dL — ABNORMAL HIGH (ref 70–99)
Glucose-Capillary: 118 mg/dL — ABNORMAL HIGH (ref 70–99)
Glucose-Capillary: 112 mg/dL — ABNORMAL HIGH (ref 70–99)
Glucose-Capillary: 107 mg/dL — ABNORMAL HIGH (ref 70–99)
Glucose-Capillary: 90 mg/dL (ref 70–99)

## 2019-10-20 LAB — CBC
HCT: 29.3 % — ABNORMAL LOW (ref 36.0–46.0)
Hemoglobin: 9.6 g/dL — ABNORMAL LOW (ref 12.0–15.0)
MCH: 29.3 pg (ref 26.0–34.0)
MCHC: 32.8 g/dL (ref 30.0–36.0)
MCV: 89.3 fL (ref 80.0–100.0)
Platelets: 342 10*3/uL (ref 150–400)
RBC: 3.28 MIL/uL — ABNORMAL LOW (ref 3.87–5.11)
RDW: 14.1 % (ref 11.5–15.5)
WBC: 8.7 10*3/uL (ref 4.0–10.5)
nRBC: 0 % (ref 0.0–0.2)

## 2019-10-20 LAB — BASIC METABOLIC PANEL
Anion gap: 10 (ref 5–15)
BUN: <5 mg/dL — ABNORMAL LOW (ref 6–20)
CO2: 20 mmol/L — ABNORMAL LOW (ref 22–32)
Calcium: 8.2 mg/dL — ABNORMAL LOW (ref 8.9–10.3)
Chloride: 115 mmol/L — ABNORMAL HIGH (ref 98–111)
Creatinine, Ser: 0.6 mg/dL (ref 0.44–1.00)
GFR calc Af Amer: >60 mL/min (ref >60)
GFR calc non Af Amer: >60 mL/min (ref >60)
Glucose, Bld: 100 mg/dL — ABNORMAL HIGH (ref 70–99)
Potassium: 3.5 mmol/L (ref 3.5–5.1)
Sodium: 145 mmol/L (ref 135–145)

## 2019-10-20 LAB — TSH: TSH: 1.322 u[IU]/mL (ref 0.350–4.500)

## 2019-10-20 LAB — HIV ANTIBODY (ROUTINE TESTING W REFLEX): HIV Screen 4th Generation wRfx: NONREACTIVE

## 2019-10-20 IMAGING — MR MR HEAD W/ CM
4 of 8 series · 18 of 48 positions shown · IV contrast (gadavist)
Comparison: MRI head without contrast [DATE]

CLINICAL DATA: Seizure.  Abnormal MRI.

EXAM:
MRI HEAD WITH CONTRAST
TECHNIQUE: Multiplanar, multiecho pulse sequences of the brain and surrounding
structures were obtained with intravenous contrast.
CONTRAST:  5mL GADAVIST GADOBUTROL 1 MMOL/ML IV SOLN

[Series 2: FLAIR · sagittal · 5.0mm · 0.47mm/px · 4 of 25 slices shown (1 of 2)]
[im 1/25]
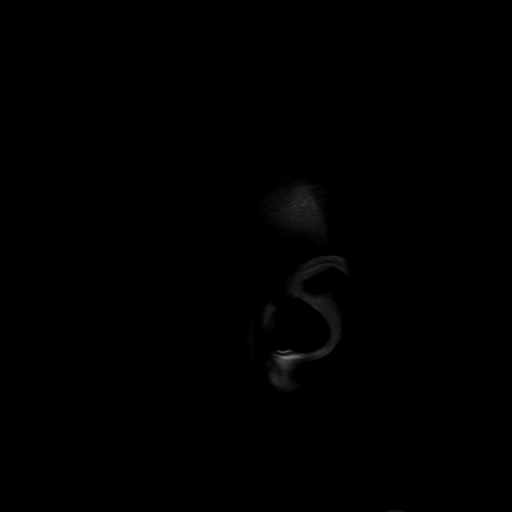
[im 9/25]
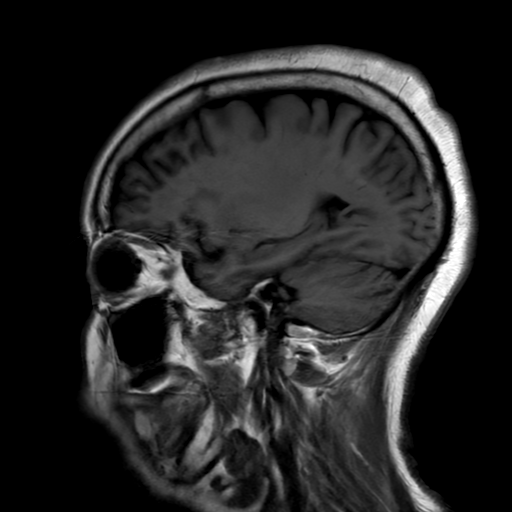
[im 17/25]
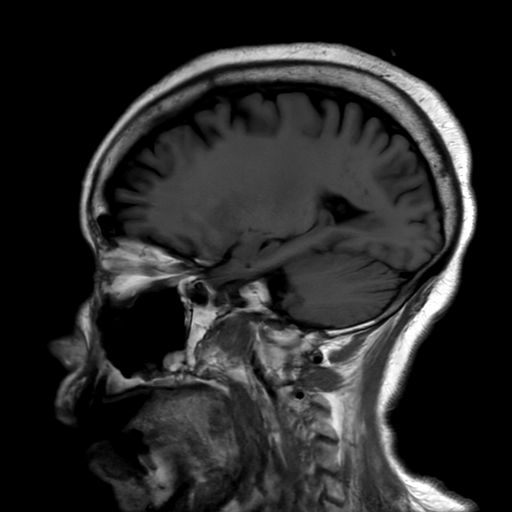
[im 25/25]
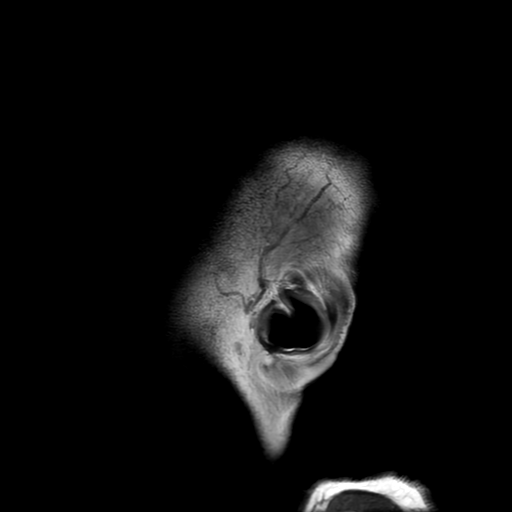

[Series 4: FLAIR · coronal · 3.0mm · 0.39mm/px · 5 of 31 slices shown (2 of 2)]
[im 1/31]
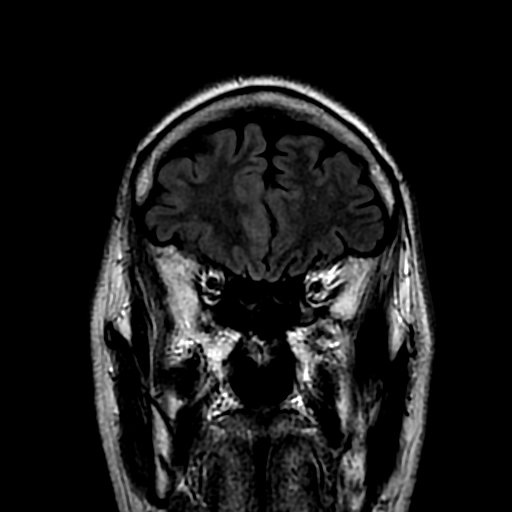
[im 8/31]
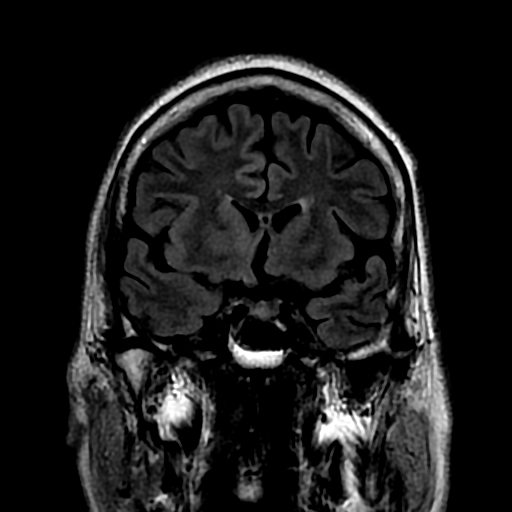
[im 16/31]
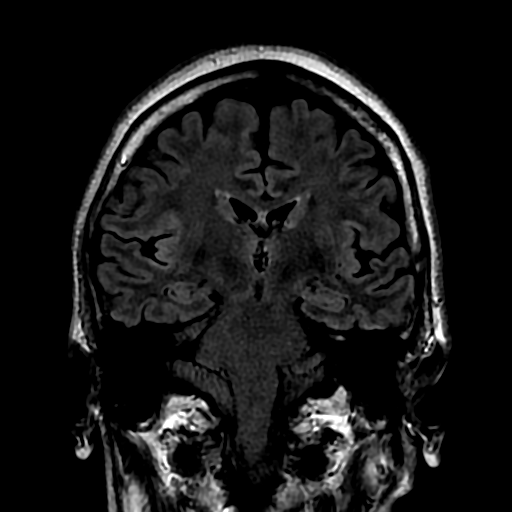
[im 23/31]
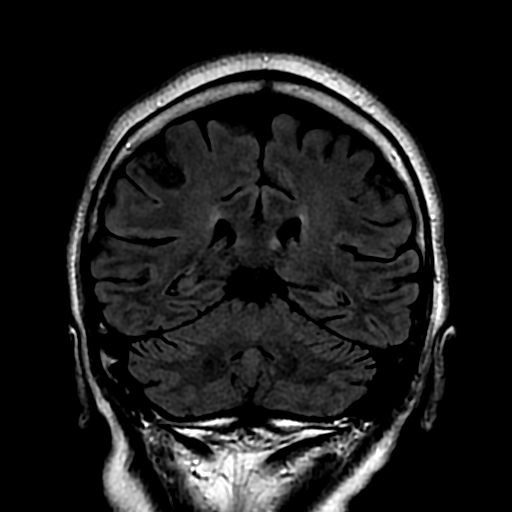
[im 31/31]
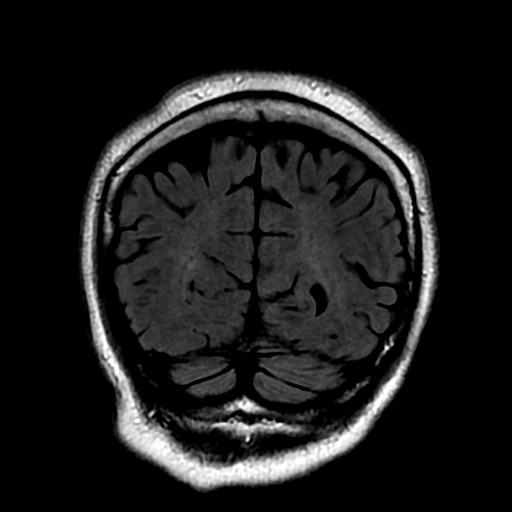

[Series 5: T2 · coronal · 3.0mm · 0.39mm/px · 5 of 31 slices shown]
[im 1/31]
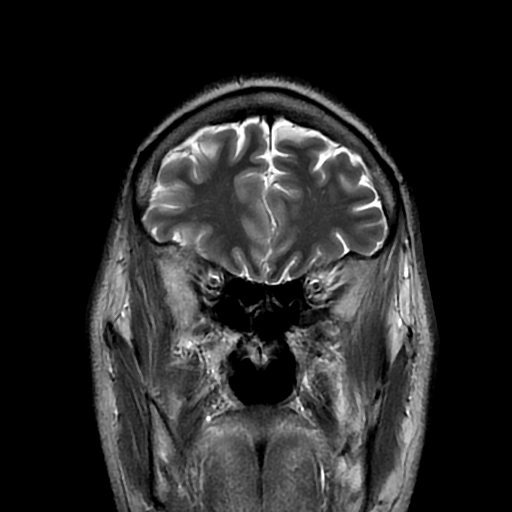
[im 8/31]
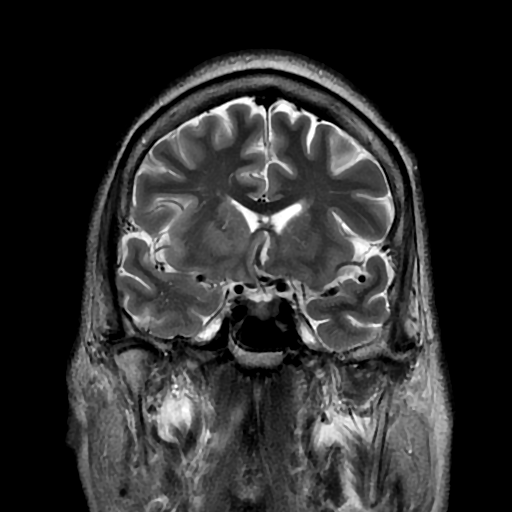
[im 16/31]
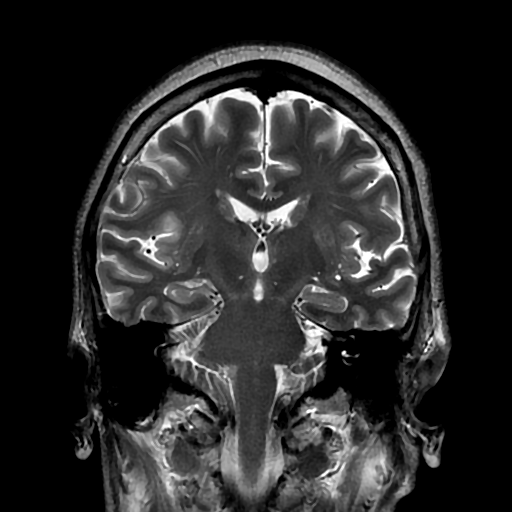
[im 23/31]
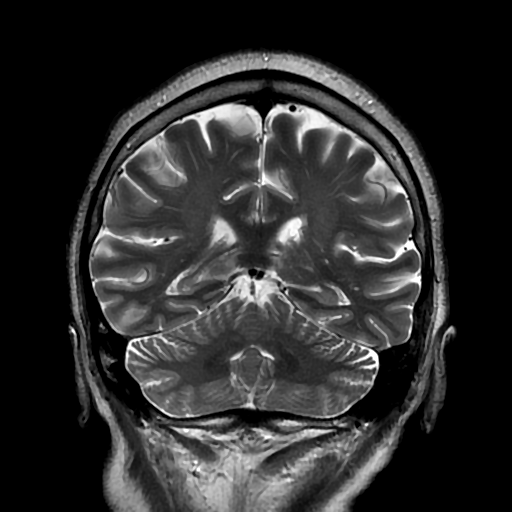
[im 31/31]
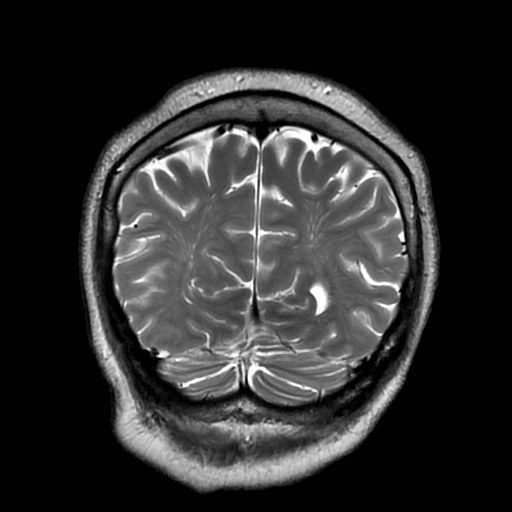

[Series 6: FLAIR post-contrast · sagittal · 5.0mm · 0.47mm/px · 4 of 25 slices shown]
[im 1/25]
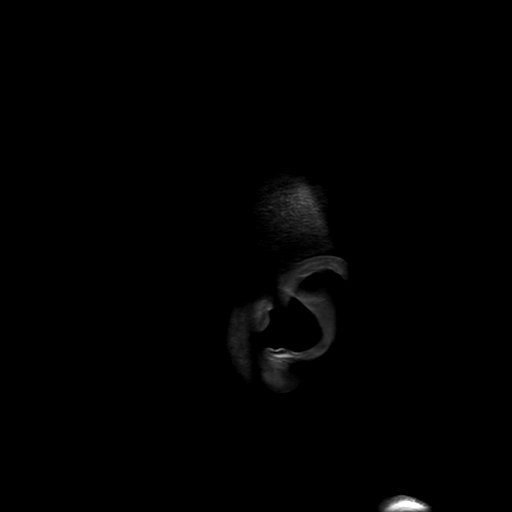
[im 9/25]
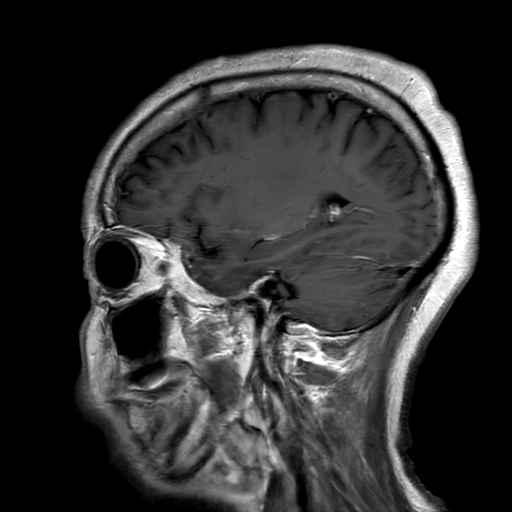
[im 17/25]
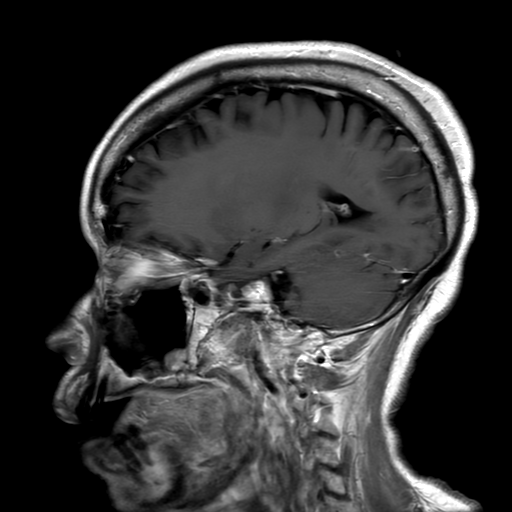
[im 25/25]
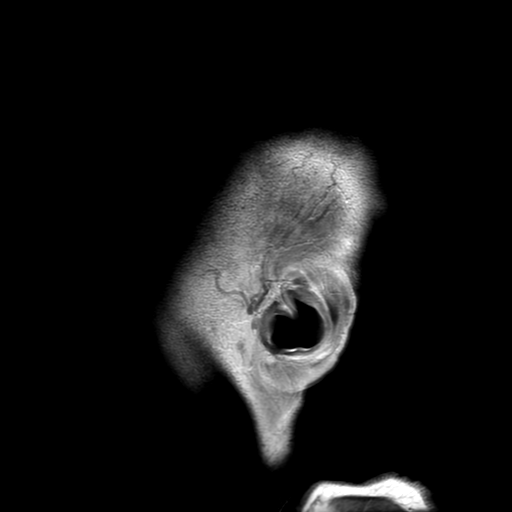

[18 of 48 positions shown; findings below may reference images not displayed]

FINDINGS: Brain: Review of the MRI from yesterday reveals restricted diffusion
in the right medial frontal cortex, also in the right insula and
right hippocampus. Note that the lesions are best seen on
diffusion-weighted imaging with no significant associated FLAIR
cortical edema. These findings are unilateral.

No abnormal enhancement on today's study.

Ventricle size is normal. Diffusion-weighted imaging was not
repeated today
IMPRESSION: Normal enhancement of the brain.

Based on the findings yesterday, differential includes seizure
related cortical activity, autoimmune related encephalitis, and less
likely viral encephalitis.

Consider lumbar puncture. Short-term follow-up MRI with contrast may
be helpful.

## 2019-10-20 MED ORDER — CLOBAZAM 10 MG PO TABS
5.0000 mg | ORAL_TABLET | Freq: Every day | ORAL | Status: DC
  Administered 2019-10-20 – 2019-10-22 (×3): 5 mg via ORAL
  Filled 2019-10-20 (×2): qty 1
  Filled 2019-10-20: qty 0.5

## 2019-10-20 MED ORDER — LORAZEPAM 2 MG/ML IJ SOLN
2.0000 mg | INTRAMUSCULAR | Status: DC | PRN
  Filled 2019-10-20: qty 1

## 2019-10-20 MED ORDER — LEVOTHYROXINE SODIUM 75 MCG PO TABS
150.0000 ug | ORAL_TABLET | Freq: Every day | ORAL | Status: DC
  Administered 2019-10-21 – 2019-11-12 (×23): 150 ug via ORAL
  Filled 2019-10-20 (×24): qty 2

## 2019-10-20 MED ORDER — LEVETIRACETAM IN NACL 500 MG/100ML IV SOLN
500.0000 mg | Freq: Once | INTRAVENOUS | Status: AC
  Administered 2019-10-20: 500 mg via INTRAVENOUS
  Filled 2019-10-20: qty 100

## 2019-10-20 MED ORDER — DEXTROSE 50 % IV SOLN
INTRAVENOUS | Status: AC
  Administered 2019-10-20: 50 mL
  Filled 2019-10-20: qty 50

## 2019-10-20 MED ORDER — ASPIRIN EC 81 MG PO TBEC
81.0000 mg | DELAYED_RELEASE_TABLET | Freq: Every day | ORAL | Status: DC
  Administered 2019-10-20: 81 mg via ORAL
  Filled 2019-10-20: qty 1

## 2019-10-20 MED ORDER — LIOTHYRONINE SODIUM 5 MCG PO TABS
5.0000 ug | ORAL_TABLET | Freq: Every day | ORAL | Status: DC
  Administered 2019-10-20 – 2019-11-12 (×24): 5 ug via ORAL
  Filled 2019-10-20 (×25): qty 1

## 2019-10-20 MED ORDER — POTASSIUM CHLORIDE 10 MEQ/100ML IV SOLN
10.0000 meq | INTRAVENOUS | Status: AC
  Administered 2019-10-20 (×3): 10 meq via INTRAVENOUS
  Filled 2019-10-20 (×3): qty 100

## 2019-10-20 MED ORDER — GADOBUTROL 1 MMOL/ML IV SOLN
5.0000 mL | Freq: Once | INTRAVENOUS | Status: AC | PRN
  Administered 2019-10-20: 5 mL via INTRAVENOUS

## 2019-10-20 MED ORDER — LEVETIRACETAM IN NACL 1500 MG/100ML IV SOLN
1500.0000 mg | Freq: Two times a day (BID) | INTRAVENOUS | Status: DC
  Administered 2019-10-20 – 2019-10-21 (×3): 1500 mg via INTRAVENOUS
  Filled 2019-10-20 (×4): qty 100

## 2019-10-20 NOTE — Progress Notes (Addendum)
PROGRESS NOTE    Andrea [name]  IWL:[id number] DOB: 1961/04/30 DOA: 10/19/2019 PCP: [name], MD   Brief Narrative: Andrea [name] is a 59 y.o. female with past medical history significant for diabetes insulin-dependent, hyperlipidemia, hypothyroidism, who was just evaluated at [facility], ER on 6/19 for hypoglycemia of 24, patient was initially nonverbal with generalized weakness at that time.  Hypoglycemia was corrected and she was discharged home.  Today family called EMS because patient's blood sugar was again low at 30.  Patient was also noted to have  left-sided weakness.  Patient was last seen normal at 2 AM.  When EMS was on the scene patient had 2 focal seizures.  Patient received IV Versed and glucose.  On arrival to the ED patient had still mild left-sided weakness and dysarthria.  Patient is lethargic, able to answer question.  She was able to tell me she was at the hospital, her name.  Her speech is not clear.  She was able to tell me that her NPH was increased recently.  Her blood sugar last night was down again to the 40s, grandson gave her peanut butter and fruits and subsequently increased to 63.  The last time that she use her 70-30 was on 6/19 morning. She use 80 units of 70/30.  She denies abdominal pain, diarrhea, denies poor oral intake.  She had an episode of nausea while I was evaluating her and she is started to vomit, she was placed on fetal position and she vomited a small amount of clear  Fluid.  Her blood sugar during my evaluation was a 65.  I instructed nurse to give patient half an amp of D-50.  Evaluation in the ED: Sodium 143, potassium 3.2, CO2 24, glucose 155, alkaline phosphatase 59, albumin 2.6, AST ALT normal, white blood cell 8.9, hemoglobin 10.2, platelet 329.  INR 1.1, CT head: Acute abnormality.  MRI without contrast: Mild restricted diffusion in the medial right frontal lobe, right insula, and likely right hippocampus most consistent with recent seizure  activity.   Assessment & Plan:   Active Problems:   Type 1 diabetes mellitus with other specified complication (HCC)   Seizure (HCC)   Hypoglycemia   Hypokalemia   1-Acute Encephalopathy; probably related to hypoglycemia, vs Seizure; Admit to progressive care unit.  Support care.  Continue to monitor CBG.   2-Seizures;   Started on Keppra. 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  Study show evidence of generalized epileptogenic city with epileptiform discharges at 0.2-0._0  which is in the ictal interictal continuum.  Started on clobazam. Appreciate neurology evaluation and recommendations.  3-Hypoglycemia;  Patient received 80 units of 70/30 on 6/19 morning.  Unclear when her insulin regimen was increased.  IV fluid, D 5.  CBG improved today, 80s to 130s. Last night CBG was 42. Patient n.p.o. awaiting speech and swallow evaluation I will check insulin growth factor, sulfonylurea, and insulin level.   4-Diabetes Type 1; Hypoglycemia She has been having Low blood sugar, symptomatics.  Continue to hold insulin. No evidence of acidosis or increase gap.  Due to severe low blood sugar, will not start long acting insulin. Will need to monitor closely Bmet and if cbg normalize will need to start long acting insulin   -Monitor CBG evey 2 hours.  -hold 70/30/  -Plan to repeat B-met tonight.  To monitor for any metabolic anion gap acidosis. If CBG increases  plan to start very low-dose Lantus.  5-Nausea; zofran PRN.  Might be related to hypoglycemia.   6-Hypokalemia;  Replete IV.  Check magnesium level  7-Hypothyroidism; continue with synthroid.  8-weight loss: When more stable would proceed with CT abdomen and pelvis. Chest x-ray no active disease.  Others; awaiting Medications reconciliation for chronic meds.   Estimated body mass index is 18.48 kg/m  as calculated from the following:   Height as of 10/18/19: _0  (1.702 m).   Weight as of 10/18/19: 53.5 kg.   DVT prophylaxis: Lovenox Code Status: Full code Family Communication: Grandson at bedside Disposition Plan:  Status is: Inpatient  Remains inpatient appropriate because:Hemodynamically unstable   Dispo: The patient is from: Home              Anticipated d/c is to: to be determine              Anticipated d/c date is: 2 days              Patient currently is not medically stable to d/c. Patient requiring IV fluids D 5 for hypoglycemia, IV keppra for seizure.         Consultants:  Neurology  Procedures:   none  Antimicrobials:    Subjective: Patient is more alert today. She was able to tell  Me her name, recognized grandson.  Denies abdominal pain. patient still having difficulty with speech.   Objective: Vitals:   10/20/19 1258 10/20/19 1306 10/20/19 1316 10/20/19 1331  BP: 135/70 137/69 127/62 138/73  Pulse: (!) 101 (!) 103 (!) 103 (!) 107  Resp: (!) 29 (!) 27 (!) 30 (!) 28  SpO2: 98% 98% 98% 99%    Intake/Output Summary (Last 24 hours) at 10/20/2019 1429 Last data filed at 10/19/2019 2150 Gross per 24 hour  Intake 1160 ml  Output --  Net 1160 ml   There were no vitals filed for this visit.  Examination:  General exam: Appears calm and comfortable  Respiratory system: Clear to auscultation. Respiratory effort normal. Cardiovascular system: S1 & S2 heard, RRR. No JVD, murmurs, rubs, gallops or clicks. No pedal edema. Gastrointestinal system: Abdomen is nondistended, soft and nontender. No organomegaly or masses felt. Normal bowel sounds heard. Central nervous system: More alert, follows command Extremities: Symmetric 5 x 5 power.   Data Reviewed: I have personally reviewed following labs and imaging studies  CBC: Recent Labs  Lab 10/18/19 1501 10/19/19 1539 10/19/19 1541 10/20/19 0456  WBC 9.2  --  8.9 8.7  NEUTROABS 7.5  --  6.8  --    HGB 12.5 10.2* 10.2* 9.6*  HCT 35.6* 30.0* 31.4* 29.3*  MCV 82.8  --  88.7 89.3  PLT 377  --  329 858   Basic Metabolic Panel: Recent Labs  Lab 10/18/19 1501 10/19/19 1539 10/19/19 1541 10/20/19 0456  NA 146* 144 143 142  K 3.0* 3.1* 3.2* 3.0*  CL 102 105 108 111  CO2 30  --  24 23  GLUCOSE 30* 154* 155* 111*  BUN 9 5* 6 <5*  CREATININE 0.63 0.50 0.76 0.62  CALCIUM 9.8  --  8.7* 8.2*   GFR: Estimated Creatinine Clearance: 63.9 mL/min (by C-G formula based on SCr of 0.62 mg/dL). Liver Function Tests: Recent Labs  Lab 10/18/19 1501 10/19/19 1541 10/20/19 0456  AST _1 ALT _2 ALKPHOS 71 59 62  BILITOT 0.8 0.7 0.7  PROT 7.8 6.6 5.9*  ALBUMIN 3.4* 2.6* 2.4*  No results for input(s): LIPASE, AMYLASE in the last 168 hours. No results for input(s): AMMONIA in the last 168 hours. Coagulation Profile: Recent Labs  Lab 10/19/19 1541  INR 1.1   Cardiac Enzymes: No results for input(s): CKTOTAL, CKMB, CKMBINDEX, TROPONINI in the last 168 hours. BNP (last 3 results) No results for input(s): PROBNP in the last 8760 hours. HbA1C: No results for input(s): HGBA1C in the last 72 hours. CBG: Recent Labs  Lab 10/20/19 0748 10/20/19 0900 10/20/19 1014 10/20/19 1155 10/20/19 1355  GLUCAP 88 75 136* 118* 112*   Lipid Profile: No results for input(s): CHOL, HDL, LDLCALC, TRIG, CHOLHDL, LDLDIRECT in the last 72 hours. Thyroid Function Tests: Recent Labs    10/20/19 0456  TSH 1.322   Anemia Panel: No results for input(s): VITAMINB12, FOLATE, FERRITIN, TIBC, IRON, RETICCTPCT in the last 72 hours. Sepsis Labs: No results for input(s): PROCALCITON, LATICACIDVEN in the last 168 hours.  Recent Results (from the past 240 hour(s))  SARS Coronavirus 2 by RT PCR (hospital order, performed in [facility] hospital lab) Nasopharyngeal Nasopharyngeal Swab     Status: None   Collection Time: 10/19/19  9:30 PM   Specimen: Nasopharyngeal Swab  Result Value Ref Range  Status   SARS Coronavirus 2 NEGATIVE NEGATIVE Final    Comment: (NOTE) SARS-CoV-2 target nucleic acids are NOT DETECTED.  The SARS-CoV-2 RNA is generally detectable in upper and lower respiratory specimens during the acute phase of infection. The lowest concentration of SARS-CoV-2 viral copies this assay can detect is 250 copies / mL. A negative result does not preclude SARS-CoV-2 infection and should not be used as the sole basis for treatment or other patient management decisions.  A negative result may occur with improper specimen collection / handling, submission of specimen other than nasopharyngeal swab, presence of viral mutation(s) within the areas targeted by this assay, and inadequate number of viral copies (<250 copies / mL). A negative result must be combined with clinical observations, patient history, and epidemiological information.  Fact Sheet for Patients:   StrictlyIdeas.no  Fact Sheet for Healthcare Providers: BankingDealers.co.za  This test is not yet approved or  cleared by the [place] FDA and has been authorized for detection and/or diagnosis of SARS-CoV-2 by FDA under an Emergency Use Authorization (EUA).  This EUA will remain in effect (meaning this test can be used) for the duration of the COVID-19 declaration under Section 564(b)(1) of the Act, 21 U.S.C. section 360bbb-3(b)(1), unless the authorization is terminated or revoked sooner.  Performed at [facility], [place] [address], [place], [place] [address]          Radiology Studies: MR BRAIN WO CONTRAST  Result Date: 10/19/2019 CLINICAL DATA:  Stroke follow-up. Left-sided weakness. Hypoglycemia. Witnessed seizures by EMS. Clinically suspected Todd's paralysis. EXAM: MRI HEAD WITHOUT CONTRAST TECHNIQUE: Multiplanar, multiecho pulse sequences of the brain and surrounding structures were obtained without intravenous contrast. COMPARISON:  Head  CT 10/19/2019 FINDINGS: The study is mildly motion degraded. Brain: There is mild restricted diffusion involving cortex in the medial right frontal lobe, right insula, and likely right hippocampus which does not conform to a vascular territory and is without associated edema. No intracranial hemorrhage, mass, midline shift, or extra-axial fluid collection is identified. No significant white matter disease is seen for age. The ventricles and sulci are normal. Vascular: Major intracranial vascular flow voids are preserved. Skull and upper cervical spine: Unremarkable bone marrow signal para Sinuses/Orbits: Bilateral cataract extraction. Small mucous retention cysts  in the maxillary sinuses. Clear mastoid air cells. Other: None. IMPRESSION: Mild restricted diffusion in the medial right frontal lobe, right insula, and likely right hippocampus most consistent with recent seizure activity. Electronically Signed   By: Logan Bores M.D.   On: 10/19/2019 16:48   DG CHEST PORT 1 VIEW  Result Date: 10/19/2019 CLINICAL DATA:  59 year old female with left-sided weakness. Concern for stroke. EXAM: PORTABLE CHEST 1 VIEW COMPARISON:  Chest radiograph dated 01/14/2005. FINDINGS: No focal consolidation, pleural effusion or pneumothorax. The cardiac silhouette is within limits. No acute osseous pathology. IMPRESSION: No active disease. Electronically Signed   By: Anner Crete M.D.   On: 10/19/2019 19:28   EEG adult  Result Date: 10/19/2019 Lora Havens, MD     10/19/2019  8:23 PM Patient Name: Jamarie Joplin MRN: [id number] Epilepsy Attending: Lora Havens Referring Physician/Provider: Dr Amie [place] Date: 10/19/2019 Duration: 30.57 mins Patient history: 59yo F who presented with ams and seizure. EEG to evaluate for seizure, Level of alertness: Awake AEDs during EEG study:  keppra Technical aspects: This EEG study was done with scalp electrodes positioned according to the 10-20 International system of electrode  placement. Electrical activity was acquired at a sampling rate of _0  and reviewed with a high frequency filter of _1  and a low frequency filter of _2 . EEG data were recorded continuously and digitally stored. Description: No posterior dominant rhythm was seen. EEG showed generalize, maximal bifrontal periodic epileptiform discharges with triphasic morphology at 0.25-0._3 . EEG also showed continuous generalized 8-_4  alpha activity.  Hyperventilation and photic stimulation were not performed.   ABNORMALITY -Periodic epileptiform discharges with triphasic morphology, generalized, maximal bifrontal IMPRESSION: This study showed evidence of generalized epileptogenicity with epileptiform discharges at 0.25-0._5  which is on the ictal-interictal continuum. No definite seizures were seen throughout the recording. Dr Cheral Marker was notified. Priyanka Barbra Sarks   Overnight EEG with video  Result Date: 10/20/2019 Lora Havens, MD     10/20/2019  8:50 AM Patient Name: Irem Stoneham MRN: [id number] Epilepsy Attending: Lora Havens Referring Physician/Provider: Dr Milus Banister Duration: 10/19/2019 2027 to 10/20/2019 0830  Patient history: 59yo F who presented with ams and seizure. EEG to evaluate for seizure,  Level of alertness: Awake  AEDs during EEG study:  keppra  Technical aspects: This EEG study was done with scalp electrodes positioned according to the 10-20 International system of electrode placement. Electrical activity was acquired at a sampling rate of _6  and reviewed with a high frequency filter of _7  and a low frequency filter of _8 . EEG data were recorded continuously and digitally stored.  Description: No posterior dominant rhythm was seen. EEG showed generalized, maximal bifrontal periodic epileptiform discharges with triphasic morphology at 0.25-0._9 . EEG also showed continuous generalized 8-_10  alpha activity.  Hyperventilation and photic stimulation were not performed.   Event button was  pressed multiple times during which patient was noted to have left side face twitching, lasting about 30 seconds to one minute. Concomitant eeg showed rhythmic sharply contoured 5-9h generalized theta-alpha activity which evolved into 3-_11  theta-delta activity.  ABNORMALITY - Focal seizure, generalized - Periodic epileptiform discharges with triphasic morphology, generalized, maximal bifrontal - Continuous slow, generalized  IMPRESSION: This study showed multiple focal seizure with generalized onset during which patient was noted to have left face twitching, lasting 30 seconds to one minute. Additionally, there are epileptiform discharges at 0.25-0._12  which is on the ictal-interictal continuum as well as moderate diffuse encephalopathy, non specific to etiology.  Priyanka Barbra Sarks  CT HEAD CODE STROKE WO CONTRAST  Result Date: 10/19/2019 CLINICAL DATA:  Code stroke.  Left-sided weakness. EXAM: CT HEAD WITHOUT CONTRAST TECHNIQUE: Contiguous axial images were obtained from the base of the skull through the vertex without intravenous contrast. COMPARISON:  None. FINDINGS: The study is mildly motion degraded. Brain: Within limitations of motion, no acute infarct, intracranial hemorrhage, mass, midline shift, or extra-axial fluid collection is identified. The ventricles and sulci are normal. Vascular: Calcified atherosclerosis at the skull base. No hyperdense vessel. Skull: No fracture or suspicious osseous lesion. Sinuses/Orbits: Partially visualized mild mucosal thickening in the left maxillary sinus. Bilateral cataract extraction. Other: None. ASPECTS [facility] Stroke Program Early CT Score) - Ganglionic level infarction (caudate, lentiform nuclei, internal capsule, insula, M1-M3 cortex): 7 - Supraganglionic infarction (M4-M6 cortex): 3 Total score (0-10 with 10 being normal): 10 IMPRESSION: 1. No evidence of acute intracranial abnormality within limitations of mild motion. 2. ASPECTS is 10. These results  were communicated to Dr. Rory Percy at 3:50 pm on 10/19/2019 by text page via the [facility] messaging system. Electronically Signed   By: Logan Bores M.D.   On: 10/19/2019 15:50        Scheduled Meds: . cloBAZam  5 mg Oral Daily  . enoxaparin (LOVENOX) injection  40 mg Subcutaneous Q24H  . levothyroxine  150 mcg Oral q morning - 10a  . sodium chloride flush  3 mL Intravenous Once   Continuous Infusions: . dextrose 5 % and 0.9% NaCl 100 mL/hr at 10/20/19 0913  . levETIRAcetam       LOS: 0 days    Time spent: 35 minutes.     Elmarie Shiley, MD [facility]   If 7PM-7AM, please contact night-coverage [email or web address]  10/20/2019, 2:29 PM

## 2019-10-20 NOTE — ED Notes (Signed)
Pt incontinent of loose BM.  Pt cleaned.

## 2019-10-20 NOTE — Progress Notes (Signed)
EEG maint complete. No skin breakdown noted on FP1 FP2 A1

## 2019-10-20 NOTE — Progress Notes (Signed)
Inpatient Diabetes Program Recommendations  AACE/ADA: New Consensus Statement on Inpatient Glycemic Control (2015)  Target Ranges:  Prepandial:   less than 140 mg/dL      Peak postprandial:   less than 180 mg/dL (1-2 hours)      Critically ill patients:  140 - 180 mg/dL   Lab Results  Component Value Date   GLUCAP 118 (H) 10/20/2019   HGBA1C 11.6 (H) 06/15/2017    Review of Glycemic Control Results for KITA, NEACE (MRN [id number]) as of 10/20/2019 13:40  Ref. Range 10/20/2019 07:48 10/20/2019 09:00 10/20/2019 10:14 10/20/2019 11:55  Glucose-Capillary Latest Ref Range: 70 - 99 mg/dL 88 75 136 (H) 118 (H)   Diabetes history: Type 1 DM Outpatient Diabetes medications: Humulin 70/30 80 units QAM, 85 units QPM Current orders for Inpatient glycemic control: D5% @ 100 ml/hr  Inpatient Diabetes Program Recommendations:    Noted hypoglycemia on presentation and recurring episodes while in ED.  If to be admitted, would consider adding Novolog 0-6 units Q4H when CBGs > 180 mg/dL. Patient is type 1 DM and requires basal insulin.  Patient is followed by [name] for outpatient endocrinology, last appointment was on 4/14 and MD increased QPM dose of 70/30. Assuming dosages are exceeding insulin needs.  Will follow.   Thanks, Bronson Curb, MSN, RNC-OB Diabetes Coordinator [phone] (8a-5p)

## 2019-10-20 NOTE — ED Notes (Signed)
SDU Micheal Sheen granddaughter [phone] looking for an update on the pt

## 2019-10-20 NOTE — Procedures (Addendum)
Patient Name: Andrea [name]  MRN: [id number]  Epilepsy Attending: Lora Havens  Referring Physician/Provider: Dr Milus Banister Duration: 10/19/2019 2027 to 10/20/2019 2027  Patient history: 59yo F who presented with ams and seizure. EEG to evaluate for seizure,   Level of alertness: Awake  AEDs during EEG study:  keppra  Technical aspects: This EEG study was done with scalp electrodes positioned according to the 10-20 International system of electrode placement. Electrical activity was acquired at a sampling rate of 500Hz  and reviewed with a high frequency filter of 70Hz  and a low frequency filter of 1Hz . EEG data were recorded continuously and digitally stored.   Description: No posterior dominant rhythm was seen. EEG initially showed generalized, maximal bifrontal periodic epileptiform discharges with triphasic morphology at 0.25-0.5Hz . Gradually, eeg showed right frontocentral periodic epileptiform discharges at 1Hz . EEG also showed continuous generalized 8-9Hz  alpha activity.   Event button was pressed multiple times during which patient was noted to have left side face twitching, lasting about 30 seconds to one minute. Concomitant eeg showed rhythmic sharply contoured 5-9h generalized theta-alpha activity which evolved into 3-5Hz  theta-delta activity.   ABNORMALITY - Seizure, generalized - Periodic epileptiform discharges with triphasic morphology, generalized, maximal bifrontal - Continuous slow, generalized  IMPRESSION: This study showed multiple seizures with generalized onset during which patient was noted to have left face twitching, lasting 30 seconds to one minute. Additionally, there are epileptiform discharges at 0.25-0.5hz  which is on the ictal-interictal continuum as well as moderate diffuse encephalopathy, non specific to etiology.  Andrea [name]

## 2019-10-20 NOTE — ED Notes (Signed)
Patient returned from MRI.

## 2019-10-20 NOTE — ED Notes (Signed)
Pt remains on EEG  Son at bedside

## 2019-10-20 NOTE — ED Notes (Signed)
Pt transported to MRI 

## 2019-10-20 NOTE — Progress Notes (Signed)
Subjective: Patient states she is feeling well.  Grandson at bedside states he has not seen any facial twitching since about midnight.  However he has noticed that patient still has some difficulty with her speech.  Patient denies any concerns.  Patient's grandson also states that patient has lost more than 4 5 pounds in the last few weeks unintentionally and has lost about 20 to 30 pounds in the last few months.  ROS: negative except above  Examination  Vital signs in last 24 hours: Pulse Rate:  [103-119] 105 (06/21 1030) Resp:  [23-31] 27 (06/21 1030) BP: (111-151)/(55-102) 131/65 (06/21 1030) SpO2:  [96 %-98 %] 96 % (06/21 1030)  General: lying in bed, not in apparent distress CVS: pulse-normal rate and rhythm RS: breathing comfortably, CTAB Extremities: normal, warm  Neuro: MS: Alert, oriented to time place and person, follows commands, no evidence of aphasia, able to tell me days of the week forward and backward, do basic subtraction in addition CN: pupils equal and reactive,  EOMI, face symmetric, tongue midline Motor: 4-/5 strength in all 4 extremities Sensory: Left sensory hemineglect Coordination: normal  Basic Metabolic Panel: Recent Labs  Lab 10/18/19 1501 10/19/19 1539 10/19/19 1541 10/20/19 0456  NA 146* 144 143 142  K 3.0* 3.1* 3.2* 3.0*  CL 102 105 108 111  CO2 30  --  24 23  GLUCOSE 30* 154* 155* 111*  BUN 9 5* 6 <5*  CREATININE 0.63 0.50 0.76 0.62  CALCIUM 9.8  --  8.7* 8.2*    CBC: Recent Labs  Lab 10/18/19 1501 10/19/19 1539 10/19/19 1541 10/20/19 0456  WBC 9.2  --  8.9 8.7  NEUTROABS 7.5  --  6.8  --   HGB 12.5 10.2* 10.2* 9.6*  HCT 35.6* 30.0* 31.4* 29.3*  MCV 82.8  --  88.7 89.3  PLT 377  --  329 342     Coagulation Studies: Recent Labs    10/19/19 1541  LABPROT 13.8  INR 1.1    Imaging MRI brain without contrast 10/19/2019: Mild restricted diffusion in the medial right frontal lobe, right insula, and likely right hippocampus  most consistent with recent seizure activity.      ASSESSMENT AND PLAN: 59 year old female presented with hyperglycemia (blood glucose 40) and focal seizures.  Focal convulsive status epilepticus (resolved) New onset seizures in setting of hypoglycemia Diabetes with episodes of hypoglycemia Microcytic anemia Hypokalemia Hypocalcemia Hypoalbuminemia Unintentional weight loss -Stat EEG yesterday showed evidence of generalized epileptogenicity with periodic epileptiform discharges on the ictal-interictal continuum.  Patient was also having intermittent left face twitching.  LTM EEG recorded multiple seizures overnight. -On exam patient is noted to have left hemineglect which could be because of the right restricted diffusion changes  Recommendations -Increase Keppra to 1500 mg twice daily -Also started clobazam 5 mg daily due to persistent seizures -If seizures do not improve, will consider lumbar puncture to look for any autoimmune etiology although low suspicion at this point -As patient continues to have intermittent seizures and left hemineglect, will obtain MRI brain with contrast to look for any acute abnormality -Continue seizure precautions -Patient will need to be discharged on antiepileptics even though it is unclear at this point if this was a provoked seizure in setting of hypoglycemia or not.  Discussed this with patient's grandson at bedside.  -As needed IV Ativan 2 mg for focal seizure lasting more than 5 minutes -Management of rest of comorbidities per primary team  I have spent a total of 40  minutes with the patient reviewing hospital notes,  test results, labs and examining the patient as well as establishing an assessment and plan that was discussed personally with the patient.  > 50% of time was spent in direct patient care.     Zeb Comfort Epilepsy [facility] For questions after 5pm please refer to AMION to reach the Neurologist on call

## 2019-10-20 NOTE — ED Notes (Signed)
Pt had bowel movement. Cleaned and placed in new brief.

## 2019-10-20 NOTE — ED Notes (Signed)
Pt's CBG is 90. Delsa Sale, RN aware.

## 2019-10-20 NOTE — ED Notes (Signed)
Called EEG to verify EEG was running. Amy confirmed it was running but will come follow up with pt.

## 2019-10-20 NOTE — ED Notes (Signed)
Pt had BM in diaper, cleaned patient and provided pericare, placed clean diaper on patient, chux, and clean sheets placed.  Pt was able to turn side to side to assist in this nurse providing care.

## 2019-10-21 ENCOUNTER — Inpatient Hospital Stay (HOSPITAL_COMMUNITY): Payer: Medicaid Other

## 2019-10-21 ENCOUNTER — Encounter (HOSPITAL_COMMUNITY): Payer: Self-pay | Admitting: Internal Medicine

## 2019-10-21 LAB — BASIC METABOLIC PANEL
Anion gap: 9 (ref 5–15)
BUN: <5 mg/dL — ABNORMAL LOW (ref 6–20)
CO2: 23 mmol/L (ref 22–32)
Calcium: 8.1 mg/dL — ABNORMAL LOW (ref 8.9–10.3)
Chloride: 111 mmol/L (ref 98–111)
Creatinine, Ser: 0.59 mg/dL (ref 0.44–1.00)
GFR calc Af Amer: >60 mL/min (ref >60)
GFR calc non Af Amer: >60 mL/min (ref >60)
Glucose, Bld: 187 mg/dL — ABNORMAL HIGH (ref 70–99)
Potassium: 3 mmol/L — ABNORMAL LOW (ref 3.5–5.1)
Sodium: 143 mmol/L (ref 135–145)

## 2019-10-21 LAB — GLUCOSE, CAPILLARY
Glucose-Capillary: 141 mg/dL — ABNORMAL HIGH (ref 70–99)
Glucose-Capillary: 159 mg/dL — ABNORMAL HIGH (ref 70–99)
Glucose-Capillary: 174 mg/dL — ABNORMAL HIGH (ref 70–99)
Glucose-Capillary: 131 mg/dL — ABNORMAL HIGH (ref 70–99)
Glucose-Capillary: 127 mg/dL — ABNORMAL HIGH (ref 70–99)
Glucose-Capillary: 139 mg/dL — ABNORMAL HIGH (ref 70–99)
Glucose-Capillary: 118 mg/dL — ABNORMAL HIGH (ref 70–99)

## 2019-10-21 LAB — CBC
HCT: 29.7 % — ABNORMAL LOW (ref 36.0–46.0)
Hemoglobin: 9.6 g/dL — ABNORMAL LOW (ref 12.0–15.0)
MCH: 28.9 pg (ref 26.0–34.0)
MCHC: 32.3 g/dL (ref 30.0–36.0)
MCV: 89.5 fL (ref 80.0–100.0)
Platelets: 340 10*3/uL (ref 150–400)
RBC: 3.32 MIL/uL — ABNORMAL LOW (ref 3.87–5.11)
RDW: 13.7 % (ref 11.5–15.5)
WBC: 7.7 10*3/uL (ref 4.0–10.5)
nRBC: 0 % (ref 0.0–0.2)

## 2019-10-21 LAB — INSULIN-LIKE GROWTH FACTOR: Somatomedin C: 97 ng/mL (ref 60–207)

## 2019-10-21 LAB — INSULIN, RANDOM: Insulin: 2.9 u[IU]/mL (ref 2.6–24.9)

## 2019-10-21 LAB — HEMOGLOBIN A1C
Hgb A1c MFr Bld: 12.4 % — ABNORMAL HIGH (ref 4.8–5.6)
Mean Plasma Glucose: 309.18 mg/dL

## 2019-10-21 IMAGING — CT CT ABD-PELV W/ CM
2 of 3 series · 13 of 46 positions shown, 15 images · IV contrast (APPLIED)
Comparison: None.

CLINICAL DATA: Unintended weight loss.

EXAM:
CT ABDOMEN AND PELVIS WITH CONTRAST
TECHNIQUE: Multidetector CT imaging of the abdomen and pelvis was performed
using the standard protocol following bolus administration of
intravenous contrast.
CONTRAST:  100mL OMNIPAQUE IOHEXOL 300 MG/ML  SOLN

[Series 6: lung · axial · 0.72mm/px · z∈[+1418,+1576]mm · 10 of 91 slices shown, 12 images]
[im 6/91  soft-tissue]
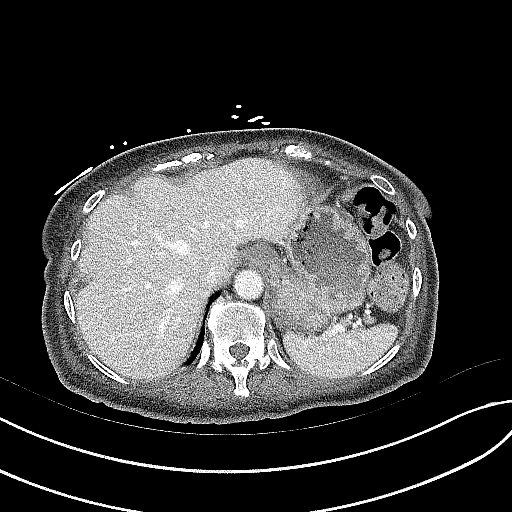
[im 6/91  bone]
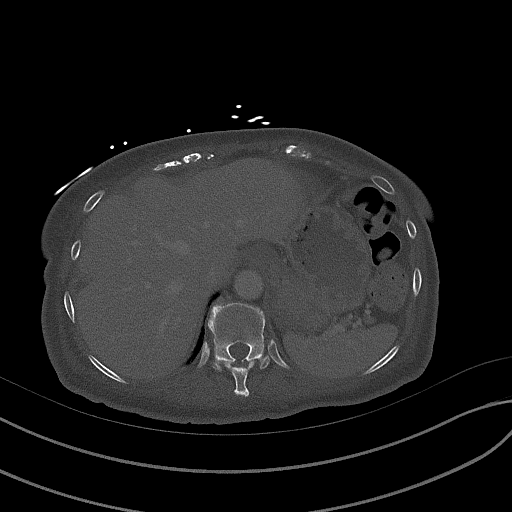
[im 15/91  soft-tissue]
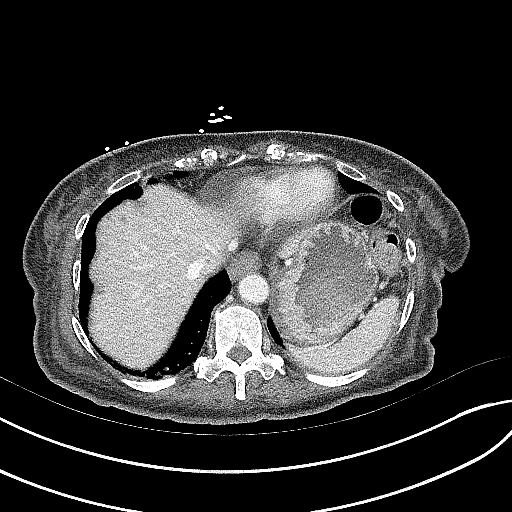
[im 24/91  soft-tissue]
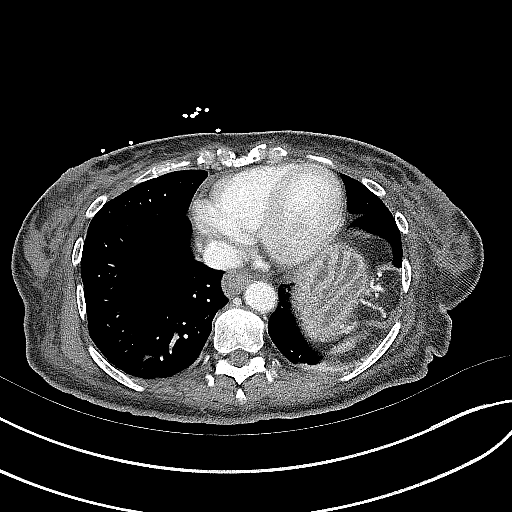
[im 32/91  soft-tissue]
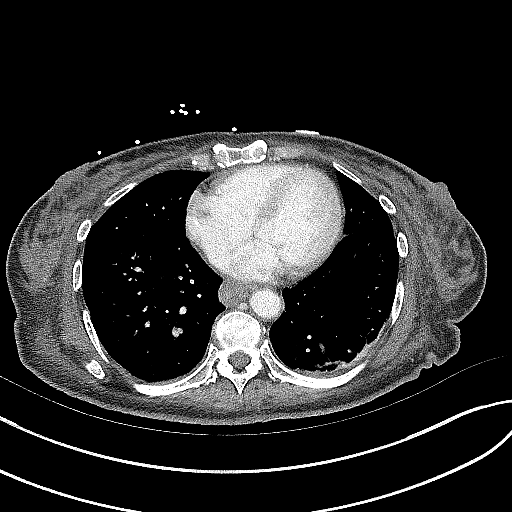
[im 41/91  soft-tissue]
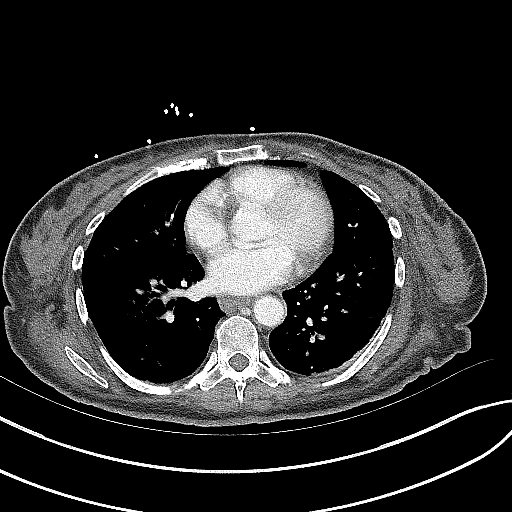
[im 50/91  soft-tissue]
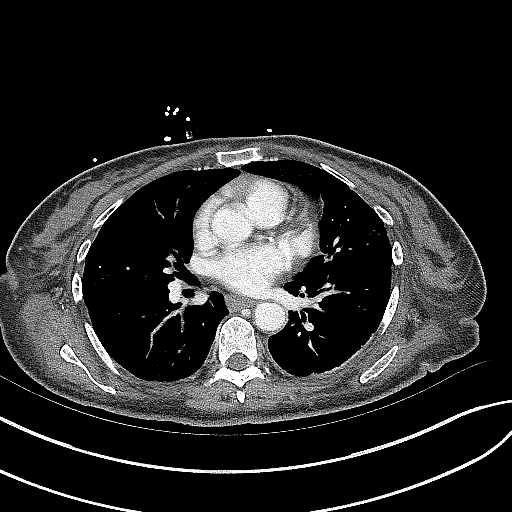
[im 59/91  soft-tissue]
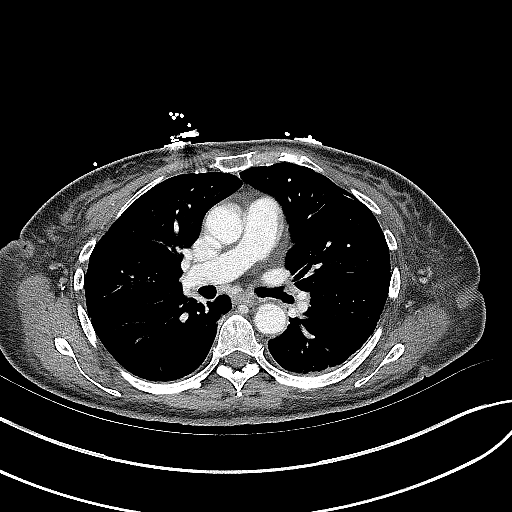
[im 67/91  soft-tissue]
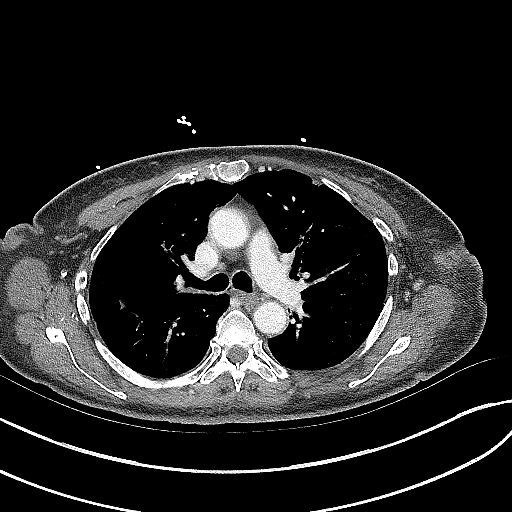
[im 76/91  soft-tissue]
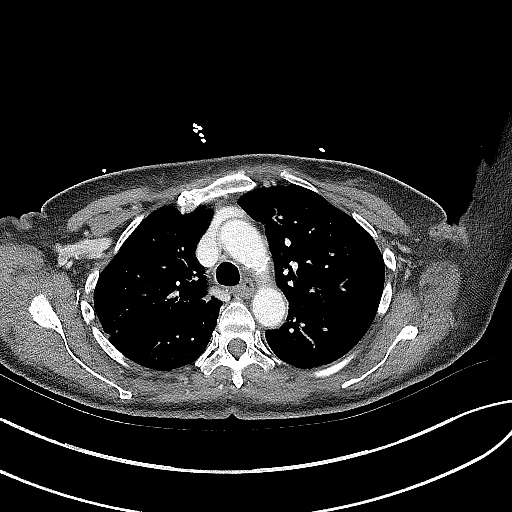
[im 76/91  bone]
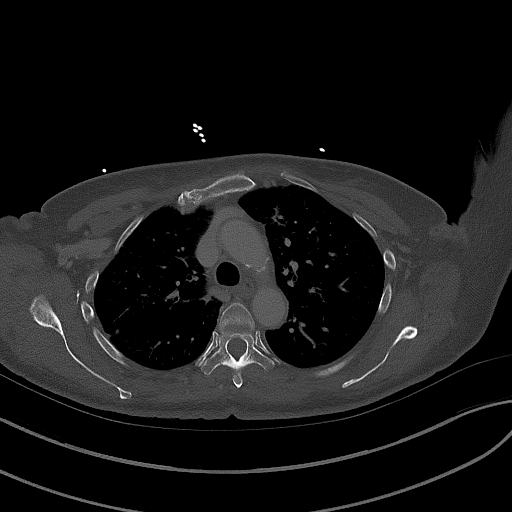
[im 85/91  soft-tissue]
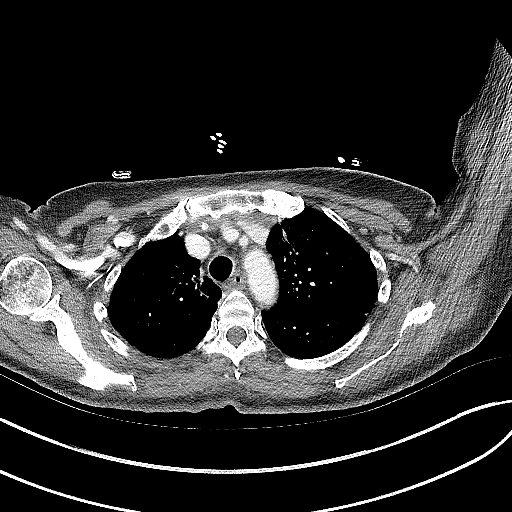

[Series 7: abdomen 3.0 mpr cor · coronal · 0.69mm/px · 3 of 79 slices shown]
[im 27/79  soft-tissue]
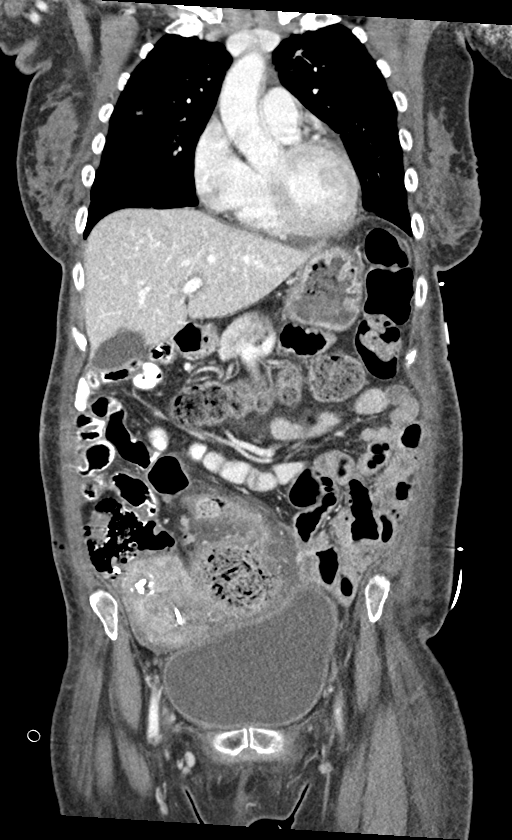
[im 35/79  soft-tissue]
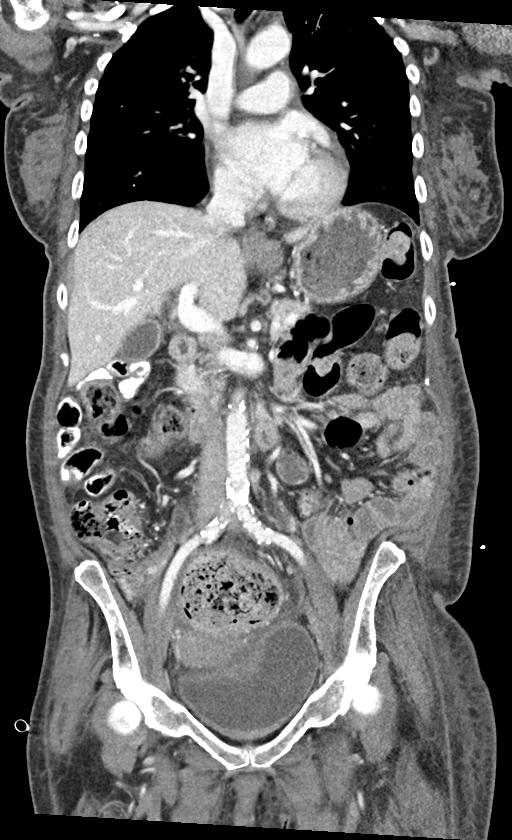
[im 44/79  soft-tissue]
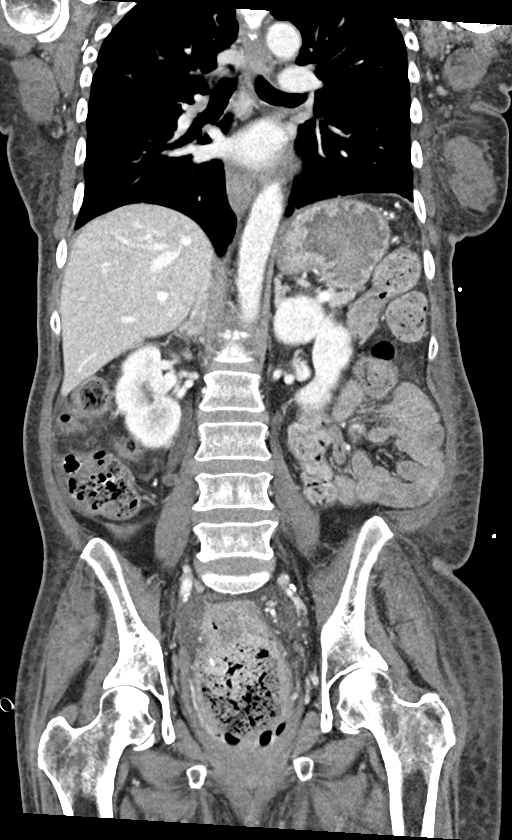

[13 of 46 positions shown; findings below may reference images not displayed]

FINDINGS: Lower chest: Majority of the chest is included in the field of view.
Scattered linear and bandlike opacities throughout both lungs are
typical of scarring. There is a subpleural 5 mm left upper lobe
pulmonary nodule, series 6, image 26. Scattered peribronchovascular
irregular opacities in the right lower lobe series 6, image 53,
right middle lobe, series 6, image 39, left lower lobe, same image,
left upper lobe series 6, image 29, and right mid lung same image.
No dominant pulmonary mass.

Hepatobiliary: Scattered subcentimeter hypodensities in the liver
are too small to characterize but likely small cysts or biliary
hamartomas. No evidence of suspicious liver lesion. Gallbladder
physiologically distended, no calcified stone. No biliary
dilatation.

Pancreas: No ductal dilatation or inflammation.

Spleen: Normal in size without focal abnormality.

Adrenals/Urinary Tract: Normal adrenal glands. There is right
hydroureteronephrosis. The ureter is dilated to pelvic brim. There
is mild left hydronephrosis and proximal hydroureter, with similar
ureteral dilatation. Diminished excretion on delayed phase imaging,
right greater than left. No evidence of focal renal mass. Urinary
bladder is displaced anteriorly by large stool ball in the rectum.

Stomach/Bowel: Large inspissated stool ball distends the rectum with
rectal distention of 8.6 cm. Associated rectal wall thickening and
perirectal edema. No pneumatosis. Approximately 6.3 cm length area
of sigmoid colonic wall thickening with pericolonic edema just
proximal to the large volume of rectosigmoid stool. Large volume of
stool throughout the remainder of the colon, there is significant
sigmoid colonic redundancy. No additional areas of colonic wall
thickening. The appendix is not confidently visualized. The stomach
is unremarkable. No small bowel obstruction or abnormal distention.
No definite small bowel inflammation.

Vascular/Lymphatic: Moderate aorto bi-iliac atherosclerosis,
advanced for age. No aortic aneurysm. Patent portal vein. There is
no adenopathy in the abdomen or pelvis.

Reproductive: Calcified uterine fibroids. The uterus is displaced
into the right pelvis and anteriorly by large rectosigmoid stool
burden. No evidence of adnexal mass.

Other: Perirectal and distal pericolonic edema with small amount of
presacral fluid. No other ascites. There is no free air or
perforation. Mild generalized body wall edema.

Musculoskeletal: Mild L1 superior endplate compression fracture, age
indeterminate. No evidence of focal bone lesion.
IMPRESSION: 1. Large inspissated stool ball distends the rectum with rectal
distention of 8.6 cm. Associated rectal wall thickening and
perirectal edema, suspicious for fecal impaction. The stool
distended rectum causes mass effect on the urinary bladder, uterus,
and distal ureters with bilateral hydroureteronephrosis, right
greater than left.
2. Approximately 6.3 cm length area of sigmoid colonic wall
thickening with pericolonic edema just proximal to the large volume
of rectosigmoid stool. This may represent focal colitis, including
stercoral colitis versus colonic neoplasm.
3. Large volume of stool throughout the remainder of the colon.
There is significant sigmoid colonic redundancy.
4. Scattered peribronchovascular irregular opacities in the lung
bases, likely infectious or inflammatory. There is a 5 mm subpleural
left upper lobe pulmonary nodule. No follow-up needed if patient is
low-risk (and has no known or suspected primary neoplasm).
Non-contrast chest CT can be considered in 12 months if patient is
high-risk. This recommendation follows the consensus statement:
Guidelines for Management of Incidental Pulmonary Nodules Detected
[DATE].
5. Mild L1 superior endplate compression fracture, age
indeterminate.

Aortic Atherosclerosis ([RV]-[RV]).

## 2019-10-21 MED ORDER — SODIUM CHLORIDE 0.9 % IV SOLN: INTRAVENOUS | Status: DC

## 2019-10-21 MED ORDER — SODIUM CHLORIDE 0.9 % IV BOLUS
500.0000 mL | Freq: Once | INTRAVENOUS | Status: AC
  Administered 2019-10-21: 500 mL via INTRAVENOUS

## 2019-10-21 MED ORDER — INSULIN GLARGINE 100 UNIT/ML ~~LOC~~ SOLN
5.0000 [IU] | Freq: Every day | SUBCUTANEOUS | Status: DC
  Administered 2019-10-21 – 2019-10-22 (×2): 5 [IU] via SUBCUTANEOUS
  Filled 2019-10-21 (×3): qty 0.05

## 2019-10-21 MED ORDER — ASPIRIN EC 81 MG PO TBEC
81.0000 mg | DELAYED_RELEASE_TABLET | Freq: Every day | ORAL | Status: DC
  Administered 2019-10-21 – 2019-11-12 (×23): 81 mg via ORAL
  Filled 2019-10-21 (×23): qty 1

## 2019-10-21 MED ORDER — POTASSIUM CHLORIDE 10 MEQ/100ML IV SOLN
10.0000 meq | INTRAVENOUS | Status: AC
  Administered 2019-10-21 (×4): 10 meq via INTRAVENOUS
  Filled 2019-10-21 (×4): qty 100

## 2019-10-21 MED ORDER — IOHEXOL 300 MG/ML  SOLN
100.0000 mL | Freq: Once | INTRAMUSCULAR | Status: AC | PRN
  Administered 2019-10-21: 100 mL via INTRAVENOUS

## 2019-10-21 MED ORDER — ENOXAPARIN SODIUM 40 MG/0.4ML ~~LOC~~ SOLN
40.0000 mg | SUBCUTANEOUS | Status: DC
  Administered 2019-10-21 – 2019-11-11 (×22): 40 mg via SUBCUTANEOUS
  Filled 2019-10-21 (×22): qty 0.4

## 2019-10-21 MED ORDER — INSULIN ASPART 100 UNIT/ML ~~LOC~~ SOLN
0.0000 [IU] | Freq: Three times a day (TID) | SUBCUTANEOUS | Status: DC
  Administered 2019-10-22: 1 [IU] via SUBCUTANEOUS
  Administered 2019-10-23: 5 [IU] via SUBCUTANEOUS
  Administered 2019-10-23: 6 [IU] via SUBCUTANEOUS
  Administered 2019-10-23: 5 [IU] via SUBCUTANEOUS
  Administered 2019-10-24 (×2): 1 [IU] via SUBCUTANEOUS
  Administered 2019-10-24: 4 [IU] via SUBCUTANEOUS
  Administered 2019-10-25 – 2019-10-27 (×3): 1 [IU] via SUBCUTANEOUS
  Administered 2019-10-27: 3 [IU] via SUBCUTANEOUS

## 2019-10-21 MED ORDER — POTASSIUM CHLORIDE CRYS ER 20 MEQ PO TBCR
40.0000 meq | EXTENDED_RELEASE_TABLET | Freq: Once | ORAL | Status: AC
  Administered 2019-10-21: 40 meq via ORAL
  Filled 2019-10-21: qty 2

## 2019-10-21 NOTE — Progress Notes (Addendum)
Subjective: No further seizures overnight.  Patient states she is feeling back to baseline.  Denies any headache, any other concerns.  ROS: negative except above  Examination  Vital signs in last 24 hours: Temp:  [97.6 F (36.4 C)-98.6 F (37 C)] 97.6 F (36.4 C) (06/22 0323) Pulse Rate:  [95-136] 98 (06/22 0700) Resp:  [15-33] 20 (06/22 0700) BP: (118-156)/(53-105) 143/79 (06/22 0523) SpO2:  [98 %-100 %] 99 % (06/22 0700)  General: lying in bed, not in apparent distress CVS: pulse-normal rate and rhythm RS: breathing comfortably, CTAB Extremities: normal, warm  Neuro: MS: Alert, oriented to time place and person, follows commands, no evidence of aphasia CN: pupils equal and reactive,  EOMI, face symmetric, tongue midline, no sensory neglect or visual neglect on face Motor: 4-/5 strength in all 4 extremities Sensory: Left sensory hemineglect Coordination: normal   Basic Metabolic Panel: Recent Labs  Lab 10/18/19 1501 10/18/19 1501 10/19/19 1539 10/19/19 1541 10/19/19 1541 10/20/19 0456 10/20/19 1709 10/21/19 0808  NA 146*   < > 144 143  --  142 145 143  K 3.0*   < > 3.1* 3.2*  --  3.0* 3.5 3.0*  CL 102   < > 105 108  --  111 115* 111  CO2 30  --   --  24  --  23 20* 23  GLUCOSE 30*   < > 154* 155*  --  111* 100* 187*  BUN 9   < > 5* 6  --  <5* <5* <5*  CREATININE 0.63   < > 0.50 0.76  --  0.62 0.60 0.59  CALCIUM 9.8   < >  --  8.7*   < > 8.2* 8.2* 8.1*   < > = values in this interval not displayed.    CBC: Recent Labs  Lab 10/18/19 1501 10/19/19 1539 10/19/19 1541 10/20/19 0456 10/21/19 0808  WBC 9.2  --  8.9 8.7 7.7  NEUTROABS 7.5  --  6.8  --   --   HGB 12.5 10.2* 10.2* 9.6* 9.6*  HCT 35.6* 30.0* 31.4* 29.3* 29.7*  MCV 82.8  --  88.7 89.3 89.5  PLT 377  --  329 342 340     Coagulation Studies: Recent Labs    10/19/19 1541  LABPROT 13.8  INR 1.1    Imaging MRI brain with contrast 10/20/2019: No abnormal enhancement.  ASSESSMENT AND  PLAN:  59 year old female presented with hyperglycemia (blood glucose 40) and focal seizures.  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 -On exam patient is noted to have left hemineglect which could be because of the right restricted diffusion changes  Recommendations -Continue Keppra 1500 mg twice daily and clobazam 5 mg daily. -No abnormal enhancement was seen on MRI, clinically patient does not have any features suggestive of infection, autoimmune etiology, APE2 score 1 ( new onset seizures), therefore will defer lumbar puncture at this point. -Continue seizure precautions including do not drive -Will need follow-up with neurology in 6 to 8 weeks.  At that time repeat MRI brain with and without contrast can be considered as well as a repeat EEG would likely be needed. -If patient has any changes in symptoms including new headache, focal neurological deficits, patient needs to return to emergency room and at that point a lumbar puncture can be considered  Seizure precautions: Per [facility] statutes, patients with seizures are not allowed to  drive until they have been seizure-free for six months and cleared by a physician    Use caution when using heavy equipment or power tools. Avoid working on ladders or at heights. Take showers instead of baths. Ensure the water temperature is not too high on the home water heater. Do not go swimming alone. Do not lock yourself in a room alone (i.e. bathroom). When caring for infants or small children, sit down when holding, feeding, or changing them to minimize risk of injury to the child in the event you have a seizure. Maintain good sleep hygiene. Avoid alcohol.    If patient has another seizure, call 911 and bring them back to the ED if: A.  The seizure lasts longer than 5 minutes.      B.  The patient doesn't wake  shortly after the seizure or has new problems such as difficulty seeing, speaking or moving following the seizure C.  The patient was injured during the seizure D.  The patient has a temperature over 102 F (39C) E.  The patient vomited during the seizure and now is having trouble breathing    During the Seizure   - First, ensure adequate ventilation and place patients on the floor on their left side  Loosen clothing around the neck and ensure the airway is patent. If the patient is clenching the teeth, do not force the mouth open with any object as this can cause severe damage - Remove all items from the surrounding that can be hazardous. The patient may be oblivious to what's happening and may not even know what he or she is doing. If the patient is confused and wandering, either gently guide him/her away and block access to outside areas - Reassure the individual and be comforting - Call 911. In most cases, the seizure ends before EMS arrives. However, there are cases when seizures may last over 3 to 5 minutes. Or the individual may have developed breathing difficulties or severe injuries. If a pregnant patient or a person with diabetes develops a seizure, it is prudent to call an ambulance. - Finally, if the patient does not regain full consciousness, then call EMS. Most patients will remain confused for about 45 to 90 minutes after a seizure, so you must use judgment in calling for help. - Avoid restraints but make sure the patient is in a bed with padded side rails - Place the individual in a lateral position with the neck slightly flexed; this will help the saliva drain from the mouth and prevent the tongue from falling backward - Remove all nearby furniture and other hazards from the area - Provide verbal assurance as the individual is regaining consciousness - Provide the patient with privacy if possible - Call for help and start treatment as ordered by the caregiver    After the Seizure  (Postictal Stage)   After a seizure, most patients experience confusion, fatigue, muscle pain and/or a headache. Thus, one should permit the individual to sleep. For the next few days, reassurance is essential. Being calm and helping reorient the person is also of importance.   Most seizures are painless and end spontaneously. Seizures are not harmful to others but can lead to complications such as stress on the lungs, brain and the heart. Individuals with prior lung problems may develop labored breathing and respiratory distress.   I have spent a total of 25 minuteswith the patient reviewing hospitalnotes,  test results, labs and examining the patient as well as  establishing an assessment and plan that was discussed personally with the patient.>50% of time was spent in direct patient care.   Zeb Comfort Epilepsy [facility] For questions after 5pm please refer to AMION to reach the Neurologist on call

## 2019-10-21 NOTE — Progress Notes (Signed)
LTM EEG discontinued - no skin breakdown at unhook.   

## 2019-10-21 NOTE — Procedures (Signed)
Patient Name:Andrea [name] DJS:[id number] Epilepsy Attending:Aadi Bordner Barbra Sarks Referring Physician/Provider:Dr Milus Banister Duration:10/20/2019 2027 to 10/21/2019 1141  Patient [name] who presented with ams and seizure. EEG to evaluate for seizure,  Level of alertness:Awake  AEDs during EEG study:keppra  Technical aspects: This EEG study was done with scalp electrodes positioned according to the 10-20 International system of electrode placement. Electrical activity was acquired at a sampling rate of 500Hz  and reviewed with a high frequency filter of 70Hz  and a low frequency filter of 1Hz . EEG data were recorded continuously and digitally stored.    Description:Noposterior dominant rhythm was seen. EEG showed periodic lateralized epileptiform discharges with polyspike and wave, maximal right frontocentral region, at 1Hz .   EEG also showed continuous generalized 2 to 3 Hz delta activity in right hemisphere as well as 8 to 9 Hz alpha activity in left hemisphere  ABNORMALITY -Periodic lateralized epileptiform discharges with polyspike and wave, right hemisphere, maximal right frontocentral region -Continuous slow, right hemisphere  IMPRESSION: This study showed evidence of epileptogenicity as well as cortical dysfunction in right hemisphere, maximal right fronto- central region secondary to underlying structural abnormality.  No definite seizures were seen during the study.     Shiven Junious Barbra Sarks

## 2019-10-21 NOTE — Progress Notes (Addendum)
PROGRESS NOTE    Andrea [name]  WEX:[id number] DOB: February 03, 1961 DOA: 10/19/2019 PCP: [name], MD   Brief Narrative: Andrea [name] is a 59 y.o. female with past medical history significant for diabetes insulin-dependent, hyperlipidemia, hypothyroidism, who was just evaluated at [facility], ER on 6/19 for hypoglycemia of 24, patient was initially nonverbal with generalized weakness at that time.  Hypoglycemia was corrected and she was discharged home.  Today family called EMS because patient's blood sugar was again low at 30.  Patient was also noted to have  left-sided weakness.  Patient was last seen normal at 2 AM.  When EMS was on the scene patient had 2 focal seizures.  Patient received IV Versed and glucose.  On arrival to the ED patient had still mild left-sided weakness and dysarthria.  Patient is lethargic, able to answer question.  She was able to tell me she was at the hospital, her name.  Her speech is not clear.  She was able to tell me that her NPH was increased recently.  Her blood sugar last night was down again to the 40s, grandson gave her peanut butter and fruits and subsequently increased to 52.  The last time that she use her 70-30 was on 6/19 morning. She use 80 units of 70/30.  She denies abdominal pain, diarrhea, denies poor oral intake.  She had an episode of nausea while I was evaluating her and she is started to vomit, she was placed on fetal position and she vomited a small amount of clear  Fluid.  Her blood sugar during my evaluation was a 65.  I instructed nurse to give patient half an amp of D-50.  Evaluation in the ED: Sodium 143, potassium 3.2, CO2 24, glucose 155, alkaline phosphatase 59, albumin 2.6, AST ALT normal, white blood cell 8.9, hemoglobin 10.2, platelet 329.  INR 1.1, CT head: Acute abnormality.  MRI without contrast: Mild restricted diffusion in the medial right frontal lobe, right insula, and likely right hippocampus most consistent with recent seizure  activity.   Assessment & Plan:   Active Problems:   Type 1 diabetes mellitus with other specified complication (HCC)   Seizure (HCC)   Hypoglycemia   Hypokalemia   1-Acute Encephalopathy; probably related to hypoglycemia, vs Seizure; Admit to progressive care unit.  Support care.  Continue to monitor CBG.   2-Seizures;  New onset.  Started on Keppra. 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  Study show evidence of generalized epileptogenic city with epileptiform discharges at 0.2-0.5hz  which is in the ictal interictal continuum.  Appreciate neurology evaluation and recommendations. Neurology recommendations: Continue with Keppra and clobazam at discharge.  No need for LP because patient does not have any suggestive sign of infection or autoimmune diseases  etiology. No further seizures overnight. Patient will need to follow-up with neurology in 6 to 8 weeks.  3-Hypoglycemia;  Patient received 80 units of 70/30 on 6/19 morning.  Unclear when her insulin regimen was increased.  IV fluid, D 5.  CBG improved today, 80s to 130s. I will check insulin growth factor pending, sulfonylurea, and insulin level 2.6.  Change D5 to normal saline fluids today. Started on low-dose Lantus 5 units.  We will also order sliding scale insulin. Patient will require significant lower dose of her insulin regimen at discharge.  4-Diabetes Type 1; Hypoglycemia She has been having Low blood sugar, symptomatics.  Continue to hold insulin.  No evidence of acidosis or increase gap.  Due to severe low blood sugar, will not start long acting insulin. Will need to monitor closely Bmet and if cbg normalize will need to start long acting insulin   -Monitor CBG evey 2 hours.  -hold 70/30/  -Started on low-dose Lantus.  She will need to be discharged on significant lower dose of her insulin  regimen  5-Nausea; zofran PRN.  Might be related to hypoglycemia.   6-Hypokalemia;  Replete IV.  Check magnesium level  7-Hypothyroidism; continue with synthroid.  8-weight loss: Proceed with CT abdomen and pelvis. Chest x-ray no active disease. Advised her that she will need colonoscopy, and her mammogram need to be updated Diarrhea; order GI pathogen. Ova and parasite.   Others; awaiting Medications reconciliation for chronic meds.   Estimated body mass index is 18.48 kg/m as calculated from the following:   Height as of 10/18/19: 5\' 7"  (1.702 m).   Weight as of 10/18/19: 53.5 kg.   DVT prophylaxis: Lovenox Code Status: Full code Family Communication: Grandson over phone, niece at bedside Disposition Plan:  Status is: Inpatient  Remains inpatient appropriate because:Hemodynamically unstable   Dispo: The patient is from: Home              Anticipated d/c is to: to be determine              Anticipated d/c date is: 2 days              Patient currently is not medically stable to d/c. Patient requiring IV fluids D 5 for hypoglycemia, IV keppra for seizure.         Consultants:  Neurology  Procedures:   none  Antimicrobials:    Subjective: Patient is more alert, less lethargic.  More interactive.  Denies abdominal pain.  She report weight loss unintentional.  Objective: Vitals:   10/21/19 0523 10/21/19 0600 10/21/19 0609 10/21/19 0700  BP: (!) 143/79     Pulse: 97 (!) 101 99 98  Resp: (!) 22 (!) 23 (!) 21 20  Temp:      TempSrc:      SpO2: 100% 100% 100% 99%    Intake/Output Summary (Last 24 hours) at 10/21/2019 1040 Last data filed at 10/21/2019 9357 Gross per 24 hour  Intake 3216.51 ml  Output 1375 ml  Net 1841.51 ml   There were no vitals filed for this visit.  Examination:  General exam: NAD Respiratory system: CTA Cardiovascular system: S 1, S 2 RRR Gastrointestinal system:BS present, soft, nt Central nervous system: Alert, follows  command Extremities: symmetric pwoer   Data Reviewed: I have personally reviewed following labs and imaging studies  CBC: Recent Labs  Lab 10/18/19 1501 10/19/19 1539 10/19/19 1541 10/20/19 0456 10/21/19 0808  WBC 9.2  --  8.9 8.7 7.7  NEUTROABS 7.5  --  6.8  --   --   HGB 12.5 10.2* 10.2* 9.6* 9.6*  HCT 35.6* 30.0* 31.4* 29.3* 29.7*  MCV 82.8  --  88.7 89.3 89.5  PLT 377  --  329 342 017   Basic Metabolic Panel: Recent Labs  Lab 10/18/19 1501 10/18/19 1501 10/19/19 1539 10/19/19 1541 10/20/19 0456 10/20/19 1709 10/21/19 0808  NA 146*   < > 144 143 142 145 143  K 3.0*   < > 3.1* 3.2* 3.0* 3.5 3.0*  CL 102   < > 105 108 111 115* 111  CO2 30  --   --  24 23  20* 23  GLUCOSE 30*   < > 154* 155* 111* 100* 187*  BUN 9   < > 5* 6 <5* <5* <5*  CREATININE 0.63   < > 0.50 0.76 0.62 0.60 0.59  CALCIUM 9.8  --   --  8.7* 8.2* 8.2* 8.1*   < > = values in this interval not displayed.   GFR: Estimated Creatinine Clearance: 63.9 mL/min (by C-G formula based on SCr of 0.59 mg/dL). Liver Function Tests: Recent Labs  Lab 10/18/19 1501 10/19/19 1541 10/20/19 0456  AST 30 31 26   ALT 14 15 15   ALKPHOS 71 59 62  BILITOT 0.8 0.7 0.7  PROT 7.8 6.6 5.9*  ALBUMIN 3.4* 2.6* 2.4*   No results for input(s): LIPASE, AMYLASE in the last 168 hours. No results for input(s): AMMONIA in the last 168 hours. Coagulation Profile: Recent Labs  Lab 10/19/19 1541  INR 1.1   Cardiac Enzymes: No results for input(s): CKTOTAL, CKMB, CKMBINDEX, TROPONINI in the last 168 hours. BNP (last 3 results) No results for input(s): PROBNP in the last 8760 hours. HbA1C: No results for input(s): HGBA1C in the last 72 hours. CBG: Recent Labs  Lab 10/20/19 2124 10/20/19 2327 10/21/19 0328 10/21/19 0542 10/21/19 0717  GLUCAP 125* 134* 141* 159* 174*   Lipid Profile: No results for input(s): CHOL, HDL, LDLCALC, TRIG, CHOLHDL, LDLDIRECT in the last 72 hours. Thyroid Function Tests: Recent Labs     10/20/19 0456  TSH 1.322   Anemia Panel: No results for input(s): VITAMINB12, FOLATE, FERRITIN, TIBC, IRON, RETICCTPCT in the last 72 hours. Sepsis Labs: No results for input(s): PROCALCITON, LATICACIDVEN in the last 168 hours.  Recent Results (from the past 240 hour(s))  SARS Coronavirus 2 by RT PCR (hospital order, performed in [facility] hospital lab) Nasopharyngeal Nasopharyngeal Swab     Status: None   Collection Time: 10/19/19  9:30 PM   Specimen: Nasopharyngeal Swab  Result Value Ref Range Status   SARS Coronavirus 2 NEGATIVE NEGATIVE Final    Comment: (NOTE) SARS-CoV-2 target nucleic acids are NOT DETECTED.  The SARS-CoV-2 RNA is generally detectable in upper and lower respiratory specimens during the acute phase of infection. The lowest concentration of SARS-CoV-2 viral copies this assay can detect is 250 copies / mL. A negative result does not preclude SARS-CoV-2 infection and should not be used as the sole basis for treatment or other patient management decisions.  A negative result may occur with improper specimen collection / handling, submission of specimen other than nasopharyngeal swab, presence of viral mutation(s) within the areas targeted by this assay, and inadequate number of viral copies (<250 copies / mL). A negative result must be combined with clinical observations, patient history, and epidemiological information.  Fact Sheet for Patients:   StrictlyIdeas.no  Fact Sheet for Healthcare Providers: BankingDealers.co.za  This test is not yet approved or  cleared by the [place] FDA and has been authorized for detection and/or diagnosis of SARS-CoV-2 by FDA under an Emergency Use Authorization (EUA).  This EUA will remain in effect (meaning this test can be used) for the duration of the COVID-19 declaration under Section 564(b)(1) of the Act, 21 U.S.C. section 360bbb-3(b)(1), unless the authorization is  terminated or revoked sooner.  Performed at [facility], [place] [address] [place], [place] [address]          Radiology Studies: MR BRAIN WO CONTRAST  Result Date: 10/19/2019 CLINICAL DATA:  Stroke follow-up. Left-sided weakness. Hypoglycemia. Witnessed seizures by  EMS. Clinically suspected Todd's paralysis. EXAM: MRI HEAD WITHOUT CONTRAST TECHNIQUE: Multiplanar, multiecho pulse sequences of the brain and surrounding structures were obtained without intravenous contrast. COMPARISON:  Head CT 10/19/2019 FINDINGS: The study is mildly motion degraded. Brain: There is mild restricted diffusion involving cortex in the medial right frontal lobe, right insula, and likely right hippocampus which does not conform to a vascular territory and is without associated edema. No intracranial hemorrhage, mass, midline shift, or extra-axial fluid collection is identified. No significant white matter disease is seen for age. The ventricles and sulci are normal. Vascular: Major intracranial vascular flow voids are preserved. Skull and upper cervical spine: Unremarkable bone marrow signal para Sinuses/Orbits: Bilateral cataract extraction. Small mucous retention cysts in the maxillary sinuses. Clear mastoid air cells. Other: None. IMPRESSION: Mild restricted diffusion in the medial right frontal lobe, right insula, and likely right hippocampus most consistent with recent seizure activity. Electronically Signed   By: Logan Bores M.D.   On: 10/19/2019 16:48   MR BRAIN W CONTRAST  Result Date: 10/20/2019 CLINICAL DATA:  Seizure.  Abnormal MRI. EXAM: MRI HEAD WITH CONTRAST TECHNIQUE: Multiplanar, multiecho pulse sequences of the brain and surrounding structures were obtained with intravenous contrast. CONTRAST:  73mL GADAVIST GADOBUTROL 1 MMOL/ML IV SOLN COMPARISON:  MRI head without contrast 05/21/2019 FINDINGS: Brain: Review of the MRI from yesterday reveals restricted diffusion in the right medial frontal cortex,  also in the right insula and right hippocampus. Note that the lesions are best seen on diffusion-weighted imaging with no significant associated FLAIR cortical edema. These findings are unilateral. No abnormal enhancement on today's study. Ventricle size is normal. Diffusion-weighted imaging was not repeated today IMPRESSION: Normal enhancement of the brain. Based on the findings yesterday, differential includes seizure related cortical activity, autoimmune related encephalitis, and less likely viral encephalitis. Consider lumbar puncture. Short-term follow-up MRI with contrast may be helpful. Electronically Signed   By: Franchot Gallo M.D.   On: 10/20/2019 16:40   DG CHEST PORT 1 VIEW  Result Date: 10/19/2019 CLINICAL DATA:  59 year old female with left-sided weakness. Concern for stroke. EXAM: PORTABLE CHEST 1 VIEW COMPARISON:  Chest radiograph dated 01/14/2005. FINDINGS: No focal consolidation, pleural effusion or pneumothorax. The cardiac silhouette is within limits. No acute osseous pathology. IMPRESSION: No active disease. Electronically Signed   By: Anner Crete M.D.   On: 10/19/2019 19:28   EEG adult  Result Date: 10/19/2019 Lora Havens, MD     10/19/2019  8:23 PM Patient Name: Andrea [name] MRN: [id number] Epilepsy Attending: Lora Havens Referring Physician/Provider: Dr Amie [place] Date: 10/19/2019 Duration: 30.57 mins Patient history: 59yo F who presented with ams and seizure. EEG to evaluate for seizure, Level of alertness: Awake AEDs during EEG study:  keppra Technical aspects: This EEG study was done with scalp electrodes positioned according to the 10-20 International system of electrode placement. Electrical activity was acquired at a sampling rate of 500Hz  and reviewed with a high frequency filter of 70Hz  and a low frequency filter of 1Hz . EEG data were recorded continuously and digitally stored. Description: No posterior dominant rhythm was seen. EEG showed generalize, maximal  bifrontal periodic epileptiform discharges with triphasic morphology at 0.25-0.5Hz . EEG also showed continuous generalized 8-9Hz  alpha activity.  Hyperventilation and photic stimulation were not performed.   ABNORMALITY -Periodic epileptiform discharges with triphasic morphology, generalized, maximal bifrontal IMPRESSION: This study showed evidence of generalized epileptogenicity with epileptiform discharges at 0.25-0.5hz  which is on the ictal-interictal continuum. No definite seizures were seen throughout  the recording. Dr Cheral Marker was notified. Priyanka Barbra Sarks   Overnight EEG with video  Result Date: 10/20/2019 Lora Havens, MD     10/21/2019 10:03 AM Patient Name: Andrea [name] MRN: [id number] Epilepsy Attending: Lora Havens Referring Physician/Provider: Dr Milus Banister Duration: 10/19/2019 2027 to 10/20/2019 2027  Patient history: 59yo F who presented with ams and seizure. EEG to evaluate for seizure,  Level of alertness: Awake  AEDs during EEG study:  keppra  Technical aspects: This EEG study was done with scalp electrodes positioned according to the 10-20 International system of electrode placement. Electrical activity was acquired at a sampling rate of 500Hz  and reviewed with a high frequency filter of 70Hz  and a low frequency filter of 1Hz . EEG data were recorded continuously and digitally stored.  Description: No posterior dominant rhythm was seen. EEG initially showed generalized, maximal bifrontal periodic epileptiform discharges with triphasic morphology at 0.25-0.5Hz . Gradually, eeg showed right frontocentral periodic epileptiform discharges at 1Hz . EEG also showed continuous generalized 8-9Hz  alpha activity. Event button was pressed multiple times during which patient was noted to have left side face twitching, lasting about 30 seconds to one minute. Concomitant eeg showed rhythmic sharply contoured 5-9h generalized theta-alpha activity which evolved into 3-5Hz  theta-delta activity.   ABNORMALITY - Focal seizure, generalized - Periodic epileptiform discharges with triphasic morphology, generalized, maximal bifrontal - Continuous slow, generalized  IMPRESSION: This study showed multiple focal seizure with generalized onset during which patient was noted to have left face twitching, lasting 30 seconds to one minute. Additionally, there are epileptiform discharges at 0.25-0.5hz  which is on the ictal-interictal continuum as well as moderate diffuse encephalopathy, non specific to etiology.  Lora Havens   CT HEAD CODE STROKE WO CONTRAST  Result Date: 10/19/2019 CLINICAL DATA:  Code stroke.  Left-sided weakness. EXAM: CT HEAD WITHOUT CONTRAST TECHNIQUE: Contiguous axial images were obtained from the base of the skull through the vertex without intravenous contrast. COMPARISON:  None. FINDINGS: The study is mildly motion degraded. Brain: Within limitations of motion, no acute infarct, intracranial hemorrhage, mass, midline shift, or extra-axial fluid collection is identified. The ventricles and sulci are normal. Vascular: Calcified atherosclerosis at the skull base. No hyperdense vessel. Skull: No fracture or suspicious osseous lesion. Sinuses/Orbits: Partially visualized mild mucosal thickening in the left maxillary sinus. Bilateral cataract extraction. Other: None. ASPECTS [facility] Stroke Program Early CT Score) - Ganglionic level infarction (caudate, lentiform nuclei, internal capsule, insula, M1-M3 cortex): 7 - Supraganglionic infarction (M4-M6 cortex): 3 Total score (0-10 with 10 being normal): 10 IMPRESSION: 1. No evidence of acute intracranial abnormality within limitations of mild motion. 2. ASPECTS is 10. These results were communicated to Dr. Rory Percy at 3:50 pm on 10/19/2019 by text page via the [facility] messaging system. Electronically Signed   By: Logan Bores M.D.   On: 10/19/2019 15:50        Scheduled Meds: . cloBAZam  5 mg Oral Daily  . insulin glargine  5 Units  Subcutaneous Daily  . levothyroxine  150 mcg Oral Q0600  . liothyronine  5 mcg Oral Daily  . sodium chloride flush  3 mL Intravenous Once   Continuous Infusions: . sodium chloride    . levETIRAcetam 1,500 mg (10/21/19 0901)  . potassium chloride       LOS: 1 day    Time spent: 35 minutes.     Elmarie Shiley, MD [facility]   If 7PM-7AM, please contact night-coverage [email or web address]  10/21/2019, 10:40 AM

## 2019-10-21 NOTE — Evaluation (Signed)
Clinical/Bedside Swallow Evaluation Patient Details  Name: Mylisa Brunson MRN: [id number] Date of Birth: May 12, 1960  Today's Date: 10/21/2019 Time: SLP Start Time (ACUTE ONLY): 0909 SLP Stop Time (ACUTE ONLY): 0938 SLP Time Calculation (min) (ACUTE ONLY): 29 min  Past Medical History:  Past Medical History:  Diagnosis Date  . Diabetes mellitus without complication ([place])   . Hyperlipidemia   . Hypothyroidism   . Thyroid disease    Past Surgical History:  Past Surgical History:  Procedure Laterality Date  . INCISION AND DRAINAGE ABSCESS Right 06/15/2017   Procedure: INCISION AND DRAINAGE ABSCESS;  Surgeon: Clayburn Pert, MD;  Location: [facility] ORS;  Service: General;  Laterality: Right;   HPI:  59 y.o. female with past medical history significant for diabetes insulin-dependent, hyperlipidemia, hypothyroidism, who was just evaluated at [facility] ER on 6/19 for hypoglycemia, patient was initially nonverbal with generalized weakness at that time.  Hypoglycemia was corrected and she was discharged home.  6/21 family called EMS because patient's blood sugar was again low at 30.  Patient was also noted to have  left-sided weakness. MRI revealed "Mild restricted diffusion in the medial right frontal lobe, right insula, and likely right hippocampus. Differential includes seizure related cortical activity, autoimmune related encephalitis, and less likely viral encephalitis. most consistent with recent seizure activity." Pt passed [facility] swallow screen, but was found subsequently to be pocketing food; SLP swallow evaluation was ordered.    Assessment / Plan / Recommendation Clinical Impression  Pt presents with overall functional swallow. Oral mechanism exam was normal; no focal CN deficits. Pt sluggish, with overall slowed performance during self-feeding, however she was oriented and attentive to swallowing.  There were no s/s of aspiration with consumption of thin liquids or solids.  Pt demonstrated  active mastication of solids despite prolonged oral phase.  There was intermittent, mild residue in left oral cavity.  Mixed consistencies of thin liquids and solids did not lead to concerns for aspiration.  Recommend dysphagia 3 diet for now; continue thin liquids; give meds whole in puree. SLP will follow briefly for safety/toleration.  D/W pt.  SLP Visit Diagnosis: Dysphagia, unspecified (R13.10)    Aspiration Risk    minimal   Diet Recommendation   dysphagia 3, thin liquids  Medication Administration: Whole meds with puree    Other  Recommendations Oral Care Recommendations: Oral care BID   Follow up Recommendations None      Frequency and Duration min 2x/week  1 week       Prognosis Prognosis for Safe Diet Advancement: Good      Swallow Study   General HPI: 59 y.o. female with past medical history significant for diabetes insulin-dependent, hyperlipidemia, hypothyroidism, who was just evaluated at [facility] ER on 6/19 for hypoglycemia, patient was initially nonverbal with generalized weakness at that time.  Hypoglycemia was corrected and she was discharged home.  6/21 family called EMS because patient's blood sugar was again low at 30.  Patient was also noted to have  left-sided weakness. MRI revealed "Mild restricted diffusion in the medial right frontal lobe, right insula, and likely right hippocampus. Differential includes seizure related cortical activity, autoimmune related encephalitis, and less likely viral encephalitis. most consistent with recent seizure activity." Pt passed [facility] swallow screen, but was found subsequently to be pocketing food; SLP swallow evaluation was ordered.  Type of Study: Bedside Swallow Evaluation Diet Prior to this Study: Other (Comment) (full liquids) Temperature Spikes Noted: No Respiratory Status: Room air History of Recent Intubation: No Behavior/Cognition:  Cooperative;Lethargic/Drowsy Oral Cavity Assessment: Within Functional Limits Oral  Care Completed by SLP: Recent completion by staff Oral Cavity - Dentition: Adequate natural dentition Vision: Functional for self-feeding Self-Feeding Abilities: Able to feed self Patient Positioning: Upright in bed Baseline Vocal Quality: Normal Volitional Cough: Strong Volitional Swallow: Able to elicit    Oral/Motor/Sensory Function Overall Oral Motor/Sensory Function: Within functional limits   Ice Chips Ice chips: Within functional limits   Thin Liquid Thin Liquid: Within functional limits    Nectar Thick Nectar Thick Liquid: Not tested   Honey Thick Honey Thick Liquid: Not tested   Puree Puree: Within functional limits   Solid     Solid: Impaired Presentation: Self Fed Oral Phase Functional Implications: Prolonged oral transit;Oral residue (left side)      Juan Quam Laurice 10/21/2019,9:40 AM  Estill Bamberg L. Tivis Ringer, [place] Office number [phone] Pager [phone]

## 2019-10-22 LAB — BASIC METABOLIC PANEL
Anion gap: 12 (ref 5–15)
BUN: <5 mg/dL — ABNORMAL LOW (ref 6–20)
CO2: 18 mmol/L — ABNORMAL LOW (ref 22–32)
Calcium: 8.1 mg/dL — ABNORMAL LOW (ref 8.9–10.3)
Chloride: 110 mmol/L (ref 98–111)
Creatinine, Ser: 0.58 mg/dL (ref 0.44–1.00)
GFR calc Af Amer: >60 mL/min (ref >60)
GFR calc non Af Amer: >60 mL/min (ref >60)
Glucose, Bld: 153 mg/dL — ABNORMAL HIGH (ref 70–99)
Potassium: 3.6 mmol/L (ref 3.5–5.1)
Sodium: 140 mmol/L (ref 135–145)

## 2019-10-22 LAB — CSF CELL COUNT WITH DIFFERENTIAL
RBC Count, CSF: 2 /mm3 — ABNORMAL HIGH
Tube #: 3
WBC, CSF: 1 /mm3 (ref 0–5)

## 2019-10-22 LAB — GLUCOSE, CAPILLARY
Glucose-Capillary: 129 mg/dL — ABNORMAL HIGH (ref 70–99)
Glucose-Capillary: 137 mg/dL — ABNORMAL HIGH (ref 70–99)
Glucose-Capillary: 145 mg/dL — ABNORMAL HIGH (ref 70–99)
Glucose-Capillary: 171 mg/dL — ABNORMAL HIGH (ref 70–99)
Glucose-Capillary: 165 mg/dL — ABNORMAL HIGH (ref 70–99)

## 2019-10-22 LAB — LACTOFERRIN, FECAL, QUALITATIVE: Lactoferrin, Fecal, Qual: NEGATIVE

## 2019-10-22 LAB — PROTEIN AND GLUCOSE, CSF
Glucose, CSF: 91 mg/dL — ABNORMAL HIGH (ref 40–70)
Total  Protein, CSF: 31 mg/dL (ref 15–45)

## 2019-10-22 LAB — CRYPTOCOCCAL ANTIGEN, CSF: Crypto Ag: NEGATIVE

## 2019-10-22 MED ORDER — CLOBAZAM 10 MG PO TABS
5.0000 mg | ORAL_TABLET | Freq: Once | ORAL | Status: AC
  Administered 2019-10-22: 5 mg via ORAL
  Filled 2019-10-22: qty 1

## 2019-10-22 MED ORDER — LEVETIRACETAM 750 MG PO TABS
1500.0000 mg | ORAL_TABLET | Freq: Two times a day (BID) | ORAL | Status: DC
  Administered 2019-10-22 – 2019-11-12 (×43): 1500 mg via ORAL
  Filled 2019-10-22 (×45): qty 2

## 2019-10-22 MED ORDER — POLYETHYLENE GLYCOL 3350 17 G PO PACK: 17.0000 g | PACK | Freq: Two times a day (BID) | ORAL | Status: DC

## 2019-10-22 MED ORDER — SODIUM CHLORIDE 0.9 % IV SOLN
1000.0000 mg | Freq: Every day | INTRAVENOUS | Status: AC
  Administered 2019-10-22 – 2019-10-26 (×5): 1000 mg via INTRAVENOUS
  Filled 2019-10-22 (×6): qty 8

## 2019-10-22 MED ORDER — POLYETHYLENE GLYCOL 3350 17 G PO PACK: 17.0000 g | PACK | Freq: Every day | ORAL | Status: DC | PRN

## 2019-10-22 MED ORDER — CLOBAZAM 10 MG PO TABS
5.0000 mg | ORAL_TABLET | Freq: Two times a day (BID) | ORAL | Status: DC
  Administered 2019-10-22 – 2019-11-11 (×41): 5 mg via ORAL
  Filled 2019-10-22 (×43): qty 1

## 2019-10-22 MED ORDER — LORAZEPAM 2 MG/ML IJ SOLN
1.0000 mg | Freq: Once | INTRAMUSCULAR | Status: AC
  Administered 2019-10-22: 1 mg via INTRAVENOUS

## 2019-10-22 NOTE — Evaluation (Signed)
Occupational Therapy Evaluation Patient Details Name: Andrea [name] MRN: [id number] DOB: [birth year] Today's Date: 10/22/2019    History of Present Illness 59 y.o. female with past medical history significant for diabetes insulin-dependent, hyperlipidemia, hypothyroidism, who was just evaluated at [facility], ER on 6/19 for hypoglycemia of 24, patient was initially nonverbal with generalized weakness at that time.  Hypoglycemia was corrected and she was discharged home.  Today family called EMS because patient's blood sugar was again low at 30.  Patient was also noted to have  left-sided weakness.  Patient was last seen normal at 2 AM.  When EMS was on the scene patient had 2 focal seizures.  Patient received IV Versed and glucose.  On arrival to the ED patient had still mild left-sided weakness and dysarthria.   Clinical Impression   PTA, pt was living at home with her gandchildren, pt reports she was independent with ADL/IADL and required assistance with stair negotiation and with mobility. Pt reports 3 falls within the past month. Pt currently scored 15/28 on the Short Blessed Test indicating impaired cognition and warranting further assessment. Pt required minA for functional mobility at RW level and modA for thorough posterior care. Pt reports she is incontinent of stool but continent of urine. Due to decline in current level of function, pt would benefit from acute OT to address established goals to facilitate safe D/C to venue listed below. At this time, recommend SNF follow-up, pt verbalized agreement with d/c recommendation plan. Will continue to follow acutely.     Follow Up Recommendations  SNF;Supervision/Assistance - 24 hour    Equipment Recommendations  3 in 1 bedside commode    Recommendations for Other Services       Precautions / Restrictions Precautions Precautions: Fall Precaution Comments: seizure Restrictions Weight Bearing Restrictions: No      Mobility Bed  Mobility Overal bed mobility: Needs Assistance Bed Mobility: Rolling;Sidelying to Sit Rolling: Min assist Sidelying to sit: Mod assist       General bed mobility comments: pt sitting in recliner upon arrival  Transfers Overall transfer level: Needs assistance Equipment used: Rolling walker (2 wheeled) Transfers: Sit to/from Stand Sit to Stand: Min assist         General transfer comment: minA for safety and stability    Balance Overall balance assessment: Needs assistance Sitting-balance support: Single extremity supported;Feet supported Sitting balance-Leahy Scale: Fair Sitting balance - Comments: close supervision    Standing balance support: Single extremity supported Standing balance-Leahy Scale: Fair Standing balance comment: minA for stability standing during pericare                           ADL either performed or assessed with clinical judgement   ADL Overall ADL's : Needs assistance/impaired Eating/Feeding: Supervision/ safety;Sitting Eating/Feeding Details (indicate cue type and reason): supervision with cues to unpocket food/take small bites and ensure thoroughly swallowed;pt able to self feed with utensil but demonstrates difficulty with functional use of LUe Grooming: Minimal assistance;Sitting   Upper Body Bathing: Minimal assistance;Sitting   Lower Body Bathing: Minimal assistance;Sit to/from stand   Upper Body Dressing : Minimal assistance;Sitting   Lower Body Dressing: Minimal assistance;Sit to/from stand   Toilet Transfer: Minimal Agricultural engineer Details (indicate cue type and reason): transferred to [facility], cues for sequencing and for safe hand placement;cues to initiate sitting Toileting- Clothing Manipulation and Hygiene: Moderate assistance Toileting - Clothing Manipulation Details (indicate cue type and reason): modA for thorough posterior  care;pt reports she is unable to detect when she has to have a BM,  is continent of urine     Functional mobility during ADLs: Minimal assistance;Rolling walker General ADL Comments: pt limited by cognition, decreased activity tolerance, decreased functional use of LUE     Vision Baseline Vision/History: No visual deficits Patient Visual Report: No change from baseline Vision Assessment?: Yes Eye Alignment: Within Functional Limits Ocular Range of Motion: Within Functional Limits Alignment/Gaze Preference: Within Defined Limits Tracking/Visual Pursuits: Decreased smoothness of horizontal tracking Saccades: Undershoots;Decreased speed of saccadic movement;Additional eye shifts occurred during testing Additional Comments: appeared to have difficulty identifying image in superior right quadrant, pt also highly distractable during assessment, difficult to thoroughly asess     Perception     Praxis      Pertinent Vitals/Pain Pain Assessment: No/denies pain     Hand Dominance Right   Extremity/Trunk Assessment Upper Extremity Assessment Upper Extremity Assessment: LUE deficits/detail LUE Deficits / Details: generalized weakness, impaired fine motor coordination (unable to hold a utensil, able to hold a cup with increased effort) L inattention LUE Sensation: WNL LUE Coordination: decreased fine motor   Lower Extremity Assessment Lower Extremity Assessment: Defer to PT evaluation   Cervical / Trunk Assessment Cervical / Trunk Assessment: Normal   Communication Communication Communication: No difficulties   Cognition Arousal/Alertness: Awake/alert Behavior During Therapy: Flat affect Overall Cognitive Status: No family/caregiver present to determine baseline cognitive functioning                                 General Comments: pt with slow processing;requires cues for navigation and sequencing. Pt scored 15/28 on Short Blessed Test to assess memory and concentration, during assessment pt demonstrated increased distractibility;  Pt's score warrants further assessment and indicated "impairment consistent with dementia (evaluate for dementing disorder)" Pt with poor awareness of deficits and of safety;when asked what is different from baseline pt stated "blood sugars" pt required cues to identify weakness in LUE and pocketing food in mouth;pt required consistent cues to unpocket food in L cheeck   General Comments  Pt tachy, into 130s during pivot to recliner;HR 120 upon arrival;noted to have left sided facial twitching at end of session, RN aware    Exercises     Shoulder Instructions      Home Living Family/patient expects to be discharged to:: Private residence Living Arrangements: Other relatives (grandchildren, 49 and 79) Available Help at Discharge: Family;Available 24 hours/day Type of Home: House Home Access: Stairs to enter [employer] of Steps: 4 Entrance Stairs-Rails: None Home Layout: One level     Bathroom Shower/Tub: Teacher, early years/pre: Standard     Home Equipment: Environmental consultant - 4 wheels          Prior Functioning/Environment Level of Independence: Needs assistance  Gait / Transfers Assistance Needed: pt reports needing assistance with stair negotiation and intermittently for ambulation ADL's / Homemaking Assistance Needed: pt reports performing ADLs independently, was driving            OT Problem List: Decreased strength;Decreased activity tolerance;Impaired balance (sitting and/or standing);Decreased cognition;Impaired vision/perception;Decreased safety awareness;Decreased knowledge of use of DME or AE;Decreased knowledge of precautions;Cardiopulmonary status limiting activity;Impaired UE functional use      OT Treatment/Interventions: Self-care/ADL training;Therapeutic exercise;Energy conservation;DME and/or AE instruction;Therapeutic activities;Cognitive remediation/compensation;Visual/perceptual remediation/compensation;Patient/family education;Balance  training    OT Goals(Current goals can be found in the care plan section) Acute Rehab OT  Goals Patient Stated Goal: to go to rehab prior to returning home;get back to baseline OT Goal Formulation: With patient Time For Goal Achievement: 11/05/19 Potential to Achieve Goals: Good ADL Goals Pt Will Perform Grooming: with supervision;standing Pt/caregiver will Perform Home Exercise Program: Left upper extremity;With theraputty;With written HEP provided Additional ADL Goal #1: Pt will complete multistep cognition task independently for safe completion of ADL/IADL. Additional ADL Goal #2: Pt will demonstrate modified independence with 3 fall prevention strategies for safe engagement in ADL/IADL and functional mobility.  OT Frequency: Min 2X/week   Barriers to D/C: Decreased caregiver support  pt reports grandkid works       Co-evaluation              AM-PAC OT "6 Clicks" Daily Activity     Outcome Measure Help from another person eating meals?: A Little Help from another person taking care of personal grooming?: A Little Help from another person toileting, which includes using toliet, bedpan, or urinal?: A Little Help from another person bathing (including washing, rinsing, drying)?: A Little Help from another person to put on and taking off regular upper body clothing?: A Little Help from another person to put on and taking off regular lower body clothing?: A Little 6 Click Score: 18   End of Session Equipment Utilized During Treatment: Gait belt;Rolling walker Nurse Communication: Mobility status  Activity Tolerance: Patient tolerated treatment well Patient left: in chair;with chair alarm set;with call bell/phone within reach  OT Visit Diagnosis: Unsteadiness on feet (R26.81);Other abnormalities of gait and mobility (R26.89);Muscle weakness (generalized) (M62.81);Repeated falls (R29.6);History of falling (Z91.81);Feeding difficulties (R63.3);Other symptoms and signs involving  cognitive function                Time: 7106-2694 OT Time Calculation (min): 30 min Charges:  OT General Charges $OT Visit: 1 Visit OT Evaluation $OT Eval Moderate Complexity: 1 Mod OT Treatments $Self Care/Home Management : 8-22 mins  Helene Kelp OTR/L Acute Rehabilitation Services Office: [phone]   Wyn Forster 10/22/2019, 2:08 PM

## 2019-10-22 NOTE — Progress Notes (Signed)
SLP Cancellation Note  Patient Details Name: Andrea [name] MRN: [id number] DOB: Dec 10, 1960   Cancelled treatment:       Reason Eval/Treat Not Completed: Medical issues which prohibited therapy  Pt lying flat for 2 hours s/p LP. Per discussion with daughter, pt had oral holding noted after lunch today, which was spaghetti with ground beef. Daughter stated pt was expectorating the pieces of beef when she arrived and wasn't sure how long it had been in her mouth. Plan for downgrade to Dys 2, as she is also concerned her mother won't eat purees d/t dislike. SLP service to follow as able to ensure tolerance and upgrade as appropriate.  Brantley Wiley P. Maurizio Geno, M.S., [place] Pathologist Acute Rehabilitation Services Pager: [place] 10/22/2019, 4:18 PM

## 2019-10-22 NOTE — Progress Notes (Signed)
Subjective: Patient's RN called stating patient has had few episodes of left face twitching overnight and during day shift.  These episodes last for about 30 seconds and patient is able to answer questions during the episodes.  ROS: negative except above  Examination  Vital signs in last 24 hours: Temp:  [98 F (36.7 C)-100.2 F (37.9 C)] 99.5 F (37.5 C) (06/23 0526) Pulse Rate:  [99-105] 102 (06/23 0409) Resp:  [17-25] 17 (06/23 0409) BP: (110-169)/(65-98) 128/81 (06/23 0409) SpO2:  [100 %] 100 % (06/23 0409)  General: lying in bed,not in apparent distress CVS: pulse-normal rate and rhythm RS: breathing comfortably,CTAB Extremities: normal,warm  Neuro: MS: Alert, orientedto time place and person, follows commands,no evidence of aphasia CN: pupils equal and reactive, EOMI, face symmetric, tongue midline, no sensory neglect or visual neglect on face Motor:4-/5 strength in all 4 extremities Sensory: Left sensory hemineglect Coordination: normal  Basic Metabolic Panel: Recent Labs  Lab 10/19/19 1541 10/19/19 1541 10/20/19 0456 10/20/19 0456 10/20/19 1709 10/21/19 0808 10/22/19 0845  NA 143  --  142  --  145 143 140  K 3.2*  --  3.0*  --  3.5 3.0* 3.6  CL 108  --  111  --  115* 111 110  CO2 24  --  23  --  20* 23 18*  GLUCOSE 155*  --  111*  --  100* 187* 153*  BUN 6  --  <5*  --  <5* <5* <5*  CREATININE 0.76  --  0.62  --  0.60 0.59 0.58  CALCIUM 8.7*   < > 8.2*   < > 8.2* 8.1* 8.1*   < > = values in this interval not displayed.    CBC: Recent Labs  Lab 10/18/19 1501 10/19/19 1539 10/19/19 1541 10/20/19 0456 10/21/19 0808  WBC 9.2  --  8.9 8.7 7.7  NEUTROABS 7.5  --  6.8  --   --   HGB 12.5 10.2* 10.2* 9.6* 9.6*  HCT 35.6* 30.0* 31.4* 29.3* 29.7*  MCV 82.8  --  88.7 89.3 89.5  PLT 377  --  329 342 340     Coagulation Studies: Recent Labs    10/19/19 1541  LABPROT 13.8  INR 1.1    Imaging CT Abdo pelvis with contrast 10/21/2019: 1. Large  inspissated stool ball distends the rectum with rectal distention of 8.6 cm. Associated rectal wall thickening and perirectal edema, suspicious for fecal impaction. The stool distended rectum causes mass effect on the urinary bladder, uterus, and distal ureters with bilateral hydroureteronephrosis, right greater than left.  2. Approximately 6.3 cm length area of sigmoid colonic wall thickening with pericolonic edema just proximal to the large volume of rectosigmoid stool. This may represent focal colitis, including stercoral colitis versus colonic neoplasm. 3. Large volume of stool throughout the remainder of the colon. There is significant sigmoid colonic redundancy. 4. Scattered peribronchovascular irregular opacities in the lung bases, likely infectious or inflammatory. There is a 5 mm subpleural left upper lobe pulmonary nodule. No follow-up needed if patient is low-risk (and has no known or suspected primary neoplasm). Non-contrast chest CT can be considered in 12 months if patient is high-risk. This recommendation follows the consensus statement: Guidelines for Management of Incidental Pulmonary Nodules Detected on CT Images: From the Fleischner Society 2017; Radiology 2017; 284:228-243. 5. Mild L1 superior endplate compression fracture, age indeterminate.   ASSESSMENT AND PLAN: 58 year old female presented with hyperglycemia (blood glucose 40) and focal seizures.  Focal convulsive status  epilepticus (resolved) New onset seizures in setting of hypoglycemia Diabetes with episodes of hypoglycemia -Patient noted to have further focal motor seizures without alteration of awareness -CT Abdo pelvis was performed which showed 6.3 cm area of sigmoid colonic wall thickening with pericolonic edema which could represent focal colitis versus colonic neoplasm.  5 mm subpleural left upper lobe pulmonary nodule was also noted.  Recommendations -Continue Keppra 1500 mg twice daily, will increase clobazam  to 5 mg twice daily -As patient continues to have focal motor seizures, will proceed with lumbar puncture to look for autoimmune/para neoplastic etiology.  Of note, these tests will take 2 to 3 weeks to come as these are send out to [facility] lab.  Therefore will discuss with patient and grandson about empirically starting steroids after infection is ruled out (very less likely at this point given clinical picture and imaging findings) -Discussed with Dr. Tawanna Solo regarding CT chest with contrast to look for neoplasm -Continue seizure precautions including do not drive -As needed IV Ativan for clinical seizure lasting more than 5 minutes   I have spent a total of  35 minutes with the patient reviewing hospital notes,  test results, labs and examining the patient as well as establishing an assessment and plan that was discussed personally with the patient and team.  > 50% of time was spent in direct patient care.     Zeb Comfort Epilepsy [facility] For questions after 5pm please refer to AMION to reach the Neurologist on call

## 2019-10-22 NOTE — Evaluation (Signed)
Physical Therapy Evaluation Patient Details Name: Andrea [name] MRN: [id number] DOB: 04/14/1961 Today's Date: 10/22/2019   History of Present Illness  59 y.o. female with past medical history significant for diabetes insulin-dependent, hyperlipidemia, hypothyroidism, who was just evaluated at [facility], ER on 6/19 for hypoglycemia of 24, patient was initially nonverbal with generalized weakness at that time.  Hypoglycemia was corrected and she was discharged home.  Today family called EMS because patient's blood sugar was again low at 30.  Patient was also noted to have  left-sided weakness.  Patient was last seen normal at 2 AM.  When EMS was on the scene patient had 2 focal seizures.  Patient received IV Versed and glucose.  On arrival to the ED patient had still mild left-sided weakness and dysarthria.  Clinical Impression  Pt presents to PT with deficits in functional mobility, gait, balance, endurance, strength, power, safety awareness, awareness of deficits, coordination. Pt with significant L inattention and impaired coordination of movement with LUE during session. Pt demonstrates poor device management and mobilizes with a slowed gait. Pt often requiring physical assistance due to power deficits when performing bed mobility or transferring. PT also notes left facial twitching during session which RN is made aware of. PT currently recommends SNF placement due to significant falls risk and current mobility deficits.    Follow Up Recommendations SNF;Supervision/Assistance - 24 hour (HHPT if family/patient elect to discharge home)    Equipment Recommendations  3in1 (PT);Rolling walker with 5" wheels    Recommendations for Other Services       Precautions / Restrictions Precautions Precautions: Fall Precaution Comments: seizure Restrictions Weight Bearing Restrictions: No      Mobility  Bed Mobility Overal bed mobility: Needs Assistance Bed Mobility: Rolling;Sidelying to  Sit Rolling: Min assist Sidelying to sit: Mod assist          Transfers Overall transfer level: Needs assistance Equipment used: Rolling walker (2 wheeled) Transfers: Sit to/from Stand Sit to Stand: Min assist            Ambulation/Gait Ambulation/Gait assistance: Min Web designer (Feet): 25 Feet Assistive device: Rolling walker (2 wheeled) Gait Pattern/deviations: Step-to pattern Gait velocity: reduced Gait velocity interpretation: <1.8 ft/sec, indicate of risk for recurrent falls General Gait Details: pt with short step to gait with reduced gait speed. Pt demonstrates left inattention and difficulty maintaining LUE on RW with limited awareness of this. Pt requires minA for device management and safety  Stairs            Wheelchair Mobility    Modified Rankin (Stroke Patients Only) Modified Rankin (Stroke Patients Only) Pre-Morbid Rankin Score: No symptoms Modified Rankin: Moderately severe disability     Balance Overall balance assessment: Needs assistance Sitting-balance support: Single extremity supported;Feet supported Sitting balance-Leahy Scale: Fair Sitting balance - Comments: close supervision at the edge of bed   Standing balance support: Single extremity supported Standing balance-Leahy Scale: Fair Standing balance comment: minG with unilateral UE support of RW or counter                             Pertinent Vitals/Pain Pain Assessment: No/denies pain    Home Living Family/patient expects to be discharged to:: Private residence Living Arrangements: Other relatives (grandchildren, 70 and 65) Available Help at Discharge: Family;Available 24 hours/day Type of Home: House Home Access: Stairs to enter Entrance Stairs-Rails: None Entrance Stairs-Number of Steps: 4 Home Layout: One level Home Equipment: Environmental consultant -  4 wheels      Prior Function Level of Independence: Needs assistance   Gait / Transfers Assistance Needed: pt  reports needing assistance with stair negotiation and intermittently for ambulation  ADL's / Homemaking Assistance Needed: pt reports performing ADLs independently, was driving        Hand Dominance        Extremity/Trunk Assessment   Upper Extremity Assessment Upper Extremity Assessment: LUE deficits/detail LUE Deficits / Details: generalized weakness, impaired fine motor coordination, L inattention LUE Coordination: decreased fine motor    Lower Extremity Assessment Lower Extremity Assessment: Generalized weakness    Cervical / Trunk Assessment Cervical / Trunk Assessment: Normal  Communication   Communication: No difficulties  Cognition Arousal/Alertness: Awake/alert Behavior During Therapy: Flat affect Overall Cognitive Status: No family/caregiver present to determine baseline cognitive functioning                                 General Comments: pt with slowed processing, oriented x4, reduced awareness of deficits and safety awareness      General Comments General comments (skin integrity, edema, etc.): pt tachy into 130s during mobility, incontinent of urine and stool during session. Pt also noted to have some left sided facial twitching present at end of session, PT makes RN aware    Exercises     Assessment/Plan    PT Assessment Patient needs continued PT services  PT Problem List Decreased strength;Decreased activity tolerance;Decreased mobility;Decreased balance;Decreased coordination;Decreased cognition;Decreased knowledge of use of DME;Decreased safety awareness;Decreased knowledge of precautions       PT Treatment Interventions DME instruction;Gait training;Stair training;Functional mobility training;Therapeutic activities;Therapeutic exercise;Balance training;Neuromuscular re-education;Cognitive remediation;Patient/family education    PT Goals (Current goals can be found in the Care Plan section)  Acute Rehab PT Goals Patient Stated  Goal: To improve mobility PT Goal Formulation: With patient Time For Goal Achievement: 11/05/19 Potential to Achieve Goals: Good Additional Goals Additional Goal #1: Pt will maintain dynamic standing balance within 10 inches of her base of support with supervision, unilateral UE support of the LRAD    Frequency Min 2X/week   Barriers to discharge        Co-evaluation               AM-PAC PT "6 Clicks" Mobility  Outcome Measure Help needed turning from your back to your side while in a flat bed without using bedrails?: A Little Help needed moving from lying on your back to sitting on the side of a flat bed without using bedrails?: A Lot Help needed moving to and from a bed to a chair (including a wheelchair)?: A Little Help needed standing up from a chair using your arms (e.g., wheelchair or bedside chair)?: A Little Help needed to walk in hospital room?: A Little Help needed climbing 3-5 steps with a railing? : Total 6 Click Score: 15    End of Session   Activity Tolerance: Patient tolerated treatment well Patient left: in chair;with call bell/phone within reach;with chair alarm set Nurse Communication: Mobility status PT Visit Diagnosis: Unsteadiness on feet (R26.81);Other abnormalities of gait and mobility (R26.89);Muscle weakness (generalized) (M62.81);Other symptoms and signs involving the nervous system (R29.898)    Time: 1020-1105 PT Time Calculation (min) (ACUTE ONLY): 45 min   Charges:   PT Evaluation $PT Eval Moderate Complexity: 1 Mod PT Treatments $Gait Training: 8-22 mins $Therapeutic Activity: 8-22 mins        Lillia Carmel  Truman Hayward PT, DPT Acute Rehabilitation Pager: [phone]   Zenaida Niece 10/22/2019, 11:17 AM

## 2019-10-22 NOTE — Progress Notes (Addendum)
PROGRESS NOTE    Andrea [name]  OIZ:[id number] DOB: [birth year] DOA: 10/19/2019 PCP: [name], MD   Brief Narrative:  Patient is a 59 year old female with history of insulin-dependent diabetes mellitus, hyperlipidemia, hypothyroidism who was brought to the emergency department for evaluation of hypoglycemia.  She presented to the emergency department on 6/19 for similar problem and was discharged to home after correction for hypoglycemia.  When EMS arrived at home, patient had 2 focal seizures and was given IV Versed and glucose.  On presentation she had mild left-sided weakness and dysarthria.  She takes 70/30 insulin at home.  On presentation, CT head did not show any acute abnormality but MRI findings are consistent with recent seizure activity.  Neurology was consulted.  Hospital course remarkable for severe constipation.PT recommending SNF on DC.  Assessment & Plan:   Active Problems:   Type 1 diabetes mellitus with other specified complication (HCC)   Seizure ([place])   Hypoglycemia   Hypokalemia   Acute encephalopathy: Most likely secondary to hypoglycemia versus seizure.  Currently alert and oriented.  Continue to monitor mental status.  Seizures: New onset.  Neurology was consulted and following.  Started on Keppra and clobazam.  EEG showed epileptiform discharges.  There were no signs of infection or autoimmune disease.  No abnormal enhancement seen on MRI.  She again had twitching of the facial muscles today.  Neurology doing LP, starting on Solu-Medrol for possible autoimmune/inflammatory disorder/paraneoplastic disorder.  Also ordering CT chest with contrast.  Hypoglycemia/type 1 diabetes: Reported of receiving 80 units of 70/30 insulin on 6/19 morning.  She is to be given IV fluids with D5.  Currently blood sugars are stable.  Currently she is on Lantus 5 units, sliding scale insulin.  Hemoglobin to 12.4.  Insulin dose needs to be reduced on discharge.  Hypokalemia: Being  monitored and supplemented  Hypothyroidism: Continue Synthyroid  History of weight loss: CT abdomen/pelvis without finding of any malignancy.  CT chest showed peribronchial irregular opacities consistent with inflammatory or infectious etiology.Showed a 5 mm subpleural left upper lobe pulmonary nodule. She was a past smoker,we recommend follow up  chest CT  in 12 months .  Severe constipation:   CT abdomen/pelvis shows severe fecal impaction, focal colitis secondary to stool balls, bilateral hydroureteronephrosis secondary to fecal impaction.  Patient having bowel movement now.Continue miralax and senokot daily  Debility/deconditioning: Patient seen by physical therapy and recommended skilled nursing facility on discharge.          DVT prophylaxis:Lovenox Code Status: Full Family Communication: None present at the bedside Status is: Inpatient  Remains inpatient appropriate because:Unsafe d/c plan   Dispo: The patient is from: Home              Anticipated d/c is to: SNF              Anticipated d/c date is: 2-3 days              Patient currently is not medically stable to d/c.      Consultants: neurology  Procedures:eEG  Antimicrobials:  Anti-infectives (From admission, onward)   None      Subjective: Patient seen and examined at the bedside this morning.  Hemodynamically stable.  Comfortable.  Currently alert and  oriented.  Had a bowel movement yesterday.  Denies any abdominal pain.  Objective: Vitals:   10/22/19 0110 10/22/19 0210 10/22/19 0409 10/22/19 0526  BP: 133/84 129/65 128/81   Pulse: (!) 102 (!) 103 (!) 102  Resp: (!) 25 19 17    Temp:   100.2 F (37.9 C) 99.5 F (37.5 C)  TempSrc:   Oral Oral  SpO2:   100%     Intake/Output Summary (Last 24 hours) at 10/22/2019 0825 Last data filed at 10/22/2019 0530 Gross per 24 hour  Intake 1860.58 ml  Output 1450 ml  Net 410.58 ml   There were no vitals filed for this visit.  Examination:  General  exam: Appears calm and comfortable ,Not in distress,average built, generalized weakness HEENT:PERRL,Oral mucosa moist, Ear/Nose normal on gross exam Respiratory system: Bilateral equal air entry, normal vesicular breath sounds, no wheezes or crackles  Cardiovascular system: S1 & S2 heard, RRR. No JVD, murmurs, rubs, gallops or clicks. No pedal edema. Gastrointestinal system: Abdomen is nondistended, soft and nontender. No organomegaly or masses felt. Normal bowel sounds heard. Central nervous system: Alert and oriented. No focal neurological deficits. Extremities: No edema, no clubbing ,no cyanosis, distal peripheral pulses palpable. Skin: No rashes, lesions or ulcers,no icterus ,no pallor   Data Reviewed: I have personally reviewed following labs and imaging studies  CBC: Recent Labs  Lab 10/18/19 1501 10/19/19 1539 10/19/19 1541 10/20/19 0456 10/21/19 0808  WBC 9.2  --  8.9 8.7 7.7  NEUTROABS 7.5  --  6.8  --   --   HGB 12.5 10.2* 10.2* 9.6* 9.6*  HCT 35.6* 30.0* 31.4* 29.3* 29.7*  MCV 82.8  --  88.7 89.3 89.5  PLT 377  --  329 342 924   Basic Metabolic Panel: Recent Labs  Lab 10/18/19 1501 10/18/19 1501 10/19/19 1539 10/19/19 1541 10/20/19 0456 10/20/19 1709 10/21/19 0808  NA 146*   < > 144 143 142 145 143  K 3.0*   < > 3.1* 3.2* 3.0* 3.5 3.0*  CL 102   < > 105 108 111 115* 111  CO2 30  --   --  24 23 20* 23  GLUCOSE 30*   < > 154* 155* 111* 100* 187*  BUN 9   < > 5* 6 <5* <5* <5*  CREATININE 0.63   < > 0.50 0.76 0.62 0.60 0.59  CALCIUM 9.8  --   --  8.7* 8.2* 8.2* 8.1*   < > = values in this interval not displayed.   GFR: Estimated Creatinine Clearance: 63.9 mL/min (by C-G formula based on SCr of 0.59 mg/dL). Liver Function Tests: Recent Labs  Lab 10/18/19 1501 10/19/19 1541 10/20/19 0456  AST 30 31 26   ALT 14 15 15   ALKPHOS 71 59 62  BILITOT 0.8 0.7 0.7  PROT 7.8 6.6 5.9*  ALBUMIN 3.4* 2.6* 2.4*   No results for input(s): LIPASE, AMYLASE in the last  168 hours. No results for input(s): AMMONIA in the last 168 hours. Coagulation Profile: Recent Labs  Lab 10/19/19 1541  INR 1.1   Cardiac Enzymes: No results for input(s): CKTOTAL, CKMB, CKMBINDEX, TROPONINI in the last 168 hours. BNP (last 3 results) No results for input(s): PROBNP in the last 8760 hours. HbA1C: Recent Labs    10/21/19 0808  HGBA1C 12.4*   CBG: Recent Labs  Lab 10/21/19 1144 10/21/19 1403 10/21/19 1736 10/21/19 1948 10/22/19 0607  GLUCAP 131* 127* 139* 118* 129*   Lipid Profile: No results for input(s): CHOL, HDL, LDLCALC, TRIG, CHOLHDL, LDLDIRECT in the last 72 hours. Thyroid Function Tests: Recent Labs    10/20/19 0456  TSH 1.322   Anemia Panel: No results for input(s): VITAMINB12, FOLATE, FERRITIN, TIBC, IRON, RETICCTPCT in the  last 72 hours. Sepsis Labs: No results for input(s): PROCALCITON, LATICACIDVEN in the last 168 hours.  Recent Results (from the past 240 hour(s))  SARS Coronavirus 2 by RT PCR (hospital order, performed in [facility] hospital lab) Nasopharyngeal Nasopharyngeal Swab     Status: None   Collection Time: 10/19/19  9:30 PM   Specimen: Nasopharyngeal Swab  Result Value Ref Range Status   SARS Coronavirus 2 NEGATIVE NEGATIVE Final    Comment: (NOTE) SARS-CoV-2 target nucleic acids are NOT DETECTED.  The SARS-CoV-2 RNA is generally detectable in upper and lower respiratory specimens during the acute phase of infection. The lowest concentration of SARS-CoV-2 viral copies this assay can detect is 250 copies / mL. A negative result does not preclude SARS-CoV-2 infection and should not be used as the sole basis for treatment or other patient management decisions.  A negative result may occur with improper specimen collection / handling, submission of specimen other than nasopharyngeal swab, presence of viral mutation(s) within the areas targeted by this assay, and inadequate number of viral copies (<250 copies / mL). A  negative result must be combined with clinical observations, patient history, and epidemiological information.  Fact Sheet for Patients:   StrictlyIdeas.no  Fact Sheet for Healthcare Providers: BankingDealers.co.za  This test is not yet approved or  cleared by the [place] FDA and has been authorized for detection and/or diagnosis of SARS-CoV-2 by FDA under an Emergency Use Authorization (EUA).  This EUA will remain in effect (meaning this test can be used) for the duration of the COVID-19 declaration under Section 564(b)(1) of the Act, 21 U.S.C. section 360bbb-3(b)(1), unless the authorization is terminated or revoked sooner.  Performed at [facility], [place] [address] [place], [place] [address]          Radiology Studies: MR BRAIN W CONTRAST  Result Date: 10/20/2019 CLINICAL DATA:  Seizure.  Abnormal MRI. EXAM: MRI HEAD WITH CONTRAST TECHNIQUE: Multiplanar, multiecho pulse sequences of the brain and surrounding structures were obtained with intravenous contrast. CONTRAST:  36mL GADAVIST GADOBUTROL 1 MMOL/ML IV SOLN COMPARISON:  MRI head without contrast 05/21/2019 FINDINGS: Brain: Review of the MRI from yesterday reveals restricted diffusion in the right medial frontal cortex, also in the right insula and right hippocampus. Note that the lesions are best seen on diffusion-weighted imaging with no significant associated FLAIR cortical edema. These findings are unilateral. No abnormal enhancement on today's study. Ventricle size is normal. Diffusion-weighted imaging was not repeated today IMPRESSION: Normal enhancement of the brain. Based on the findings yesterday, differential includes seizure related cortical activity, autoimmune related encephalitis, and less likely viral encephalitis. Consider lumbar puncture. Short-term follow-up MRI with contrast may be helpful. Electronically Signed   By: Franchot Gallo M.D.   On: 10/20/2019  16:40   CT ABDOMEN PELVIS W CONTRAST  Result Date: 10/22/2019 CLINICAL DATA:  Unintended weight loss. EXAM: CT ABDOMEN AND PELVIS WITH CONTRAST TECHNIQUE: Multidetector CT imaging of the abdomen and pelvis was performed using the standard protocol following bolus administration of intravenous contrast. CONTRAST:  13mL OMNIPAQUE IOHEXOL 300 MG/ML  SOLN COMPARISON:  None. FINDINGS: Lower chest: Majority of the chest is included in the field of view. Scattered linear and bandlike opacities throughout both lungs are typical of scarring. There is a subpleural 5 mm left upper lobe pulmonary nodule, series 6, image 26. Scattered peribronchovascular irregular opacities in the right lower lobe series 6, image 53, right middle lobe, series 6, image 39, left lower lobe, same image,  left upper lobe series 6, image 29, and right mid lung same image. No dominant pulmonary mass. Hepatobiliary: Scattered subcentimeter hypodensities in the liver are too small to characterize but likely small cysts or biliary hamartomas. No evidence of suspicious liver lesion. Gallbladder physiologically distended, no calcified stone. No biliary dilatation. Pancreas: No ductal dilatation or inflammation. Spleen: Normal in size without focal abnormality. Adrenals/Urinary Tract: Normal adrenal glands. There is right hydroureteronephrosis. The ureter is dilated to pelvic brim. There is mild left hydronephrosis and proximal hydroureter, with similar ureteral dilatation. Diminished excretion on delayed phase imaging, right greater than left. No evidence of focal renal mass. Urinary bladder is displaced anteriorly by large stool ball in the rectum. Stomach/Bowel: Large inspissated stool ball distends the rectum with rectal distention of 8.6 cm. Associated rectal wall thickening and perirectal edema. No pneumatosis. Approximately 6.3 cm length area of sigmoid colonic wall thickening with pericolonic edema just proximal to the large volume of  rectosigmoid stool. Large volume of stool throughout the remainder of the colon, there is significant sigmoid colonic redundancy. No additional areas of colonic wall thickening. The appendix is not confidently visualized. The stomach is unremarkable. No small bowel obstruction or abnormal distention. No definite small bowel inflammation. Vascular/Lymphatic: Moderate aorto bi-iliac atherosclerosis, advanced for age. No aortic aneurysm. Patent portal vein. There is no adenopathy in the abdomen or pelvis. Reproductive: Calcified uterine fibroids. The uterus is displaced into the right pelvis and anteriorly by large rectosigmoid stool burden. No evidence of adnexal mass. Other: Perirectal and distal pericolonic edema with small amount of presacral fluid. No other ascites. There is no free air or perforation. Mild generalized body wall edema. Musculoskeletal: Mild L1 superior endplate compression fracture, age indeterminate. No evidence of focal bone lesion. IMPRESSION: 1. Large inspissated stool ball distends the rectum with rectal distention of 8.6 cm. Associated rectal wall thickening and perirectal edema, suspicious for fecal impaction. The stool distended rectum causes mass effect on the urinary bladder, uterus, and distal ureters with bilateral hydroureteronephrosis, right greater than left. 2. Approximately 6.3 cm length area of sigmoid colonic wall thickening with pericolonic edema just proximal to the large volume of rectosigmoid stool. This may represent focal colitis, including stercoral colitis versus colonic neoplasm. 3. Large volume of stool throughout the remainder of the colon. There is significant sigmoid colonic redundancy. 4. Scattered peribronchovascular irregular opacities in the lung bases, likely infectious or inflammatory. There is a 5 mm subpleural left upper lobe pulmonary nodule. No follow-up needed if patient is low-risk (and has no known or suspected primary neoplasm). Non-contrast chest CT  can be considered in 12 months if patient is high-risk. This recommendation follows the consensus statement: Guidelines for Management of Incidental Pulmonary Nodules Detected on CT Images: From the Fleischner Society 2017; Radiology 2017; 284:228-243. 5. Mild L1 superior endplate compression fracture, age indeterminate. Aortic Atherosclerosis (ICD10-I70.0). Electronically Signed   By: Keith Rake M.D.   On: 10/22/2019 00:00   Overnight EEG with video  Result Date: 10/20/2019 Lora Havens, MD     10/21/2019  1:03 PM Patient Name: Elena Davia MRN: [id number] Epilepsy Attending: Lora Havens Referring Physician/Provider: Dr Milus Banister Duration: 10/19/2019 2027 to 10/20/2019 2027  Patient history: 59yo F who presented with ams and seizure. EEG to evaluate for seizure,  Level of alertness: Awake  AEDs during EEG study:  keppra  Technical aspects: This EEG study was done with scalp electrodes positioned according to the 10-20 International system of electrode placement. Electrical activity was acquired  at a sampling rate of 500Hz  and reviewed with a high frequency filter of 70Hz  and a low frequency filter of 1Hz . EEG data were recorded continuously and digitally stored.  Description: No posterior dominant rhythm was seen. EEG initially showed generalized, maximal bifrontal periodic epileptiform discharges with triphasic morphology at 0.25-0.5Hz . Gradually, eeg showed right frontocentral periodic epileptiform discharges at 1Hz . EEG also showed continuous generalized 8-9Hz  alpha activity. Event button was pressed multiple times during which patient was noted to have left side face twitching, lasting about 30 seconds to one minute. Concomitant eeg showed rhythmic sharply contoured 5-9h generalized theta-alpha activity which evolved into 3-5Hz  theta-delta activity.  ABNORMALITY - Seizure, generalized - Periodic epileptiform discharges with triphasic morphology, generalized, maximal bifrontal -  Continuous slow, generalized  IMPRESSION: This study showed multiple seizures with generalized onset during which patient was noted to have left face twitching, lasting 30 seconds to one minute. Additionally, there are epileptiform discharges at 0.25-0.5hz  which is on the ictal-interictal continuum as well as moderate diffuse encephalopathy, non specific to etiology.  Priyanka Barbra Sarks        Scheduled Meds: . aspirin EC  81 mg Oral Daily  . cloBAZam  5 mg Oral Daily  . enoxaparin (LOVENOX) injection  40 mg Subcutaneous Q24H  . insulin aspart  0-6 Units Subcutaneous TID WC  . insulin glargine  5 Units Subcutaneous Daily  . levothyroxine  150 mcg Oral Q0600  . liothyronine  5 mcg Oral Daily  . polyethylene glycol  17 g Oral BID  . sodium chloride flush  3 mL Intravenous Once   Continuous Infusions: . sodium chloride 75 mL/hr at 10/22/19 0525  . levETIRAcetam Stopped (10/22/19 0533)     LOS: 2 days    Time spent: 35 mins.More than 50% of that time was spent in counseling and/or coordination of care.      Shelly Coss, MD [facility] P6/23/2021, 8:25 AM

## 2019-10-22 NOTE — Procedures (Signed)
LUMBAR PUNCTURE (SPINAL TAP) PROCEDURE NOTE  Indication: Seizure   Proceduralists: Dr Zeb Comfort, Etta Quill, PA  Time of procedure: 10/22/2019 1500   Risks of the procedure were dicussed with the patient including post-LP headache, bleeding, infection, weakness/numbness of legs(radiculopathy), death.    Consent obtained from: patient    Procedure Note The patient was prepped and draped, and using sterile technique a 20 gauge quinke spinal needle was inserted in the L4-5 space.   Opening pressure was iot checked   Approximately 13 cc of CSF were obtained and sent for analysis.  Patient tolerated the procedure well and blood loss was minimal.    Andrea [name] Andrea [name]

## 2019-10-22 NOTE — NC FL2 (Signed)
[place] LEVEL OF CARE SCREENING TOOL     IDENTIFICATION  Patient Name: Andrea [name] Birthdate: 1960-07-02 Sex: female Admission Date (Current Location): 10/19/2019  [facility] and [place] Number:  Engineering geologist and Address:  [facility], [place] [address] [place], [place] [address]      Provider Number: [id number]  Attending Physician Name and Address:  Shelly Coss, MD  Relative Name and Phone Number:       Current Level of Care: Hospital Recommended Level of Care: [place] Prior Approval Number:    Date Approved/Denied:   PASRR Number: [id number]  Discharge Plan: SNF    Current Diagnoses: Patient Active Problem List   Diagnosis Date Noted  . Seizure ([place]) 10/19/2019  . Hypoglycemia 10/19/2019  . Hypokalemia 10/19/2019  . Back pain 06/14/2017  . Fibroids 06/14/2017  . 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 Hypertension associated with diabetes ([place]) 06/14/2017  . Hypothyroid 06/14/2017  . Menometrorrhagia 06/14/2017  . Ovarian failure 09/22/2016  . Colon polyps 10/29/2014  . Left foot pain 04/15/2014  . Abnormal uterine bleeding 02/24/2014  . Cervical polyp 10/16/2013  . High risk medication use 10/16/2013  . Hyperlipidemia 10/16/2013  . Increased frequency of urination 10/16/2013  . Type 1 diabetes mellitus with other specified complication ([place]) 23/76/2831  . Diabetic keto-acidosis ([place]) 01/01/2013  . Graves disease 01/01/2013  . Hyperglycemia 01/01/2013  . Urine ketones 01/01/2013  . Annual physical exam 08/15/2012  . Pigmented skin lesions 08/15/2012    Orientation RESPIRATION BLADDER Height & Weight     Self, Time, Situation, Place  Normal Incontinent Weight:   Height:     BEHAVIORAL SYMPTOMS/MOOD NEUROLOGICAL BOWEL NUTRITION STATUS    Convulsions/Seizures Incontinent Diet (see DC summary)  AMBULATORY STATUS COMMUNICATION OF NEEDS Skin   Extensive Assist Verbally Normal                        Personal Care Assistance Level of Assistance  Bathing, Feeding, Dressing Bathing Assistance: Maximum assistance Feeding assistance: Independent Dressing Assistance: Maximum assistance     Functional Limitations Info             SPECIAL CARE FACTORS FREQUENCY  PT (By licensed PT), OT (By licensed OT), Speech therapy     PT Frequency: 5x/wk OT Frequency: 5x/wk     Speech Therapy Frequency: 5x/wk      Contractures Contractures Info: Not present    Additional Factors Info  Code Status, Allergies, Insulin Sliding Scale Code Status Info: full Allergies Info: Latex   Insulin Sliding Scale Info: 0-6 units 3x/day with meals       Current Medications (10/22/2019):  This is the current hospital active medication list Current Facility-Administered Medications  Medication Dose Route Frequency Provider Last Rate Last Admin  . acetaminophen (TYLENOL) tablet 650 mg  650 mg Oral Q6H PRN Regalado, Belkys A, MD       Or  . acetaminophen (TYLENOL) suppository 650 mg  650 mg Rectal Q6H PRN Regalado, Belkys A, MD      . aspirin EC tablet 81 mg  81 mg Oral Daily Regalado, Belkys A, MD   81 mg at 10/22/19 1105  . cloBAZam (ONFI) tablet 5 mg  5 mg Oral Once Lora Havens, MD      . cloBAZam (ONFI) tablet 5 mg  5 mg Oral BID Zeb Comfort O, MD      . enoxaparin (LOVENOX) injection 40 mg  40  mg Subcutaneous Q24H Regalado, Belkys A, MD   40 mg at 10/21/19 2127  . hydrALAZINE (APRESOLINE) injection 10 mg  10 mg Intravenous Q6H PRN Regalado, Belkys A, MD      . insulin aspart (novoLOG) injection 0-6 Units  0-6 Units Subcutaneous TID WC Regalado, Belkys A, MD      . insulin glargine (LANTUS) injection 5 Units  5 Units Subcutaneous Daily Regalado, Belkys A, MD   5 Units at 10/22/19 1106  . levETIRAcetam (KEPPRA) tablet 1,500 mg  1,500 mg Oral BID Shelly Coss, MD   1,500 mg at 10/22/19 1105  . levothyroxine (SYNTHROID) tablet 150 mcg  150 mcg Oral Q0600 Efraim Kaufmann, RPH   150  mcg at 10/22/19 0525  . liothyronine (CYTOMEL) tablet 5 mcg  5 mcg Oral Daily Regalado, Belkys A, MD   5 mcg at 10/22/19 0525  . LORazepam (ATIVAN) injection 2 mg  2 mg Intravenous PRN Lora Havens, MD      . ondansetron (ZOFRAN) injection 4 mg  4 mg Intravenous Q6H PRN Regalado, Belkys A, MD   4 mg at 10/19/19 1926  . polyethylene glycol (MIRALAX / GLYCOLAX) packet 17 g  17 g Oral Daily PRN Adhikari, Amrit, MD      . sodium chloride flush (NS) 0.9 % injection 3 mL  3 mL Intravenous Once Regalado, Belkys A, MD         Discharge Medications: Please see discharge summary for a list of discharge medications.  Relevant Imaging Results:  Relevant Lab Results:   Additional Information SS#: [id number]  Geralynn Ochs, LCSW

## 2019-10-22 NOTE — TOC Initial Note (Signed)
Transition of Care [facility]) - Initial/Assessment Note    Patient Details  Name: Andrea [name] MRN: [id number] Date of Birth: May 25, 1960  Transition of Care [facility]) CM/SW Contact:    Geralynn Ochs, LCSW Phone Number:  10/22/2019, 12:46 PM  Clinical Narrative:    CSW met with patient to discuss recommendation for SNF, and patient agreeable. Patient said she's never been to SNF before. CSW offered choice and patient chose [facility] in [place] asked if patient would like family contacted to discuss SNF recommendation and patient refused. CSW sent referral and will follow.               Expected Discharge Plan: Skilled Nursing Facility Barriers to Discharge: Continued Medical Work up   Patient Goals and CMS Choice Patient states their goals for this hospitalization and ongoing recovery are:: get rehab CMS Medicare.gov Compare Post Acute Care list provided to:: Patient Choice offered to / list presented to : Patient  Expected Discharge Plan and Services Expected Discharge Plan: [place] Choice: [place] arrangements for the past 2 months: Single Family Home                                      Prior Living Arrangements/Services Living arrangements for the past 2 months: Single Family Home Lives with:: Self Patient language and need for interpreter reviewed:: No Do you feel safe going back to the place where you live?: Yes      Need for Family Participation in Patient Care: No (Comment) Care giver support system in place?: No (comment)   Criminal Activity/Legal Involvement Pertinent to Current Situation/Hospitalization: No - Comment as needed  Activities of Daily Living      Permission Sought/Granted Permission sought to share information with : Facility Art therapist granted to share information with : Yes, Verbal Permission Granted     Permission granted to share info w AGENCY:  SNF        Emotional Assessment Appearance:: Appears stated age Attitude/Demeanor/Rapport: Engaged Affect (typically observed): Pleasant Orientation: : Oriented to Self, Oriented to Place, Oriented to  Time, Oriented to Situation Alcohol / Substance Use: Not Applicable Psych Involvement: No (comment)  Admission diagnosis:  Seizure ([place]) [R56.9] Hypoglycemia [E16.2] Patient Active Problem List   Diagnosis Date Noted   Seizure ([place]) 10/19/2019   Hypoglycemia 10/19/2019   Hypokalemia 10/19/2019   Back pain 06/14/2017   Fibroids 06/14/2017   Herpes simplex 06/14/2017   Hypertension associated with diabetes ([place]) 06/14/2017   Hypothyroid 06/14/2017   Menometrorrhagia 06/14/2017   Ovarian failure 09/22/2016   Colon polyps 10/29/2014   Left foot pain 04/15/2014   Abnormal uterine bleeding 02/24/2014   Cervical polyp 10/16/2013   High risk medication use 10/16/2013   Hyperlipidemia 10/16/2013   Increased frequency of urination 10/16/2013   Type 1 diabetes mellitus with other specified complication ([place]) 01/21/3006   Diabetic keto-acidosis ([place]) 01/01/2013   Graves disease 01/01/2013   Hyperglycemia 01/01/2013   Urine ketones 01/01/2013   Annual physical exam 08/15/2012   Pigmented skin lesions 08/15/2012   PCP:  [name], MD Pharmacy:   [id number] [place] [place] [address] [id number]: [phone]: [phone]    Social Determinants of Health (SDOH) Interventions    Readmission Risk Interventions No flowsheet data found.

## 2019-10-22 NOTE — Progress Notes (Signed)
Inpatient Diabetes Program Recommendations  AACE/ADA: New Consensus Statement on Inpatient Glycemic Control (2015)  Target Ranges:  Prepandial:   less than 140 mg/dL      [facility] postprandial:   less than 180 mg/dL (1-2 hours)      Critically ill patients:  140 - 180 mg/dL   Lab Results  Component Value Date   GLUCAP 145 (H) 10/22/2019   HGBA1C 12.4 (H) 10/21/2019    Review of Glycemic Control  Results for SHERRIE, MARSAN (MRN [id number]) as of 10/22/2019 13:31  Ref. Range 10/21/2019 07:17 10/21/2019 11:44 10/21/2019 14:03 10/21/2019 17:36 10/21/2019 19:48 10/22/2019 06:07 10/22/2019 10:03 10/22/2019 11:35  Glucose-Capillary Latest Ref Range: 70 - 99 mg/dL 174 (H) 131 (H) 127 (H) 139 (H) 118 (H) 129 (H) 137 (H) 145 (H)   Diabetes history:  DM1  Outpatient Diabetes medications:  Humulin 70/30 80 units qam & 85 units qpm (pm dose was increased from 80 units on 08/13/19  Current orders for Inpatient glycemic control:  Novolog 0-6 unit tid Lantus 5 units daily  Note:  Spoke with patient at bedside.  She lives with her grandchildren.  Confirmed above medications.  She states she denies difficulties obtaining medications and takes as prescribed.  She rotates sites and uses vial and syringe.  Current A1C is 12.4% (average blood sugar of 309 mg/dl).  Per endocrinologists note in Care Everywhere her last A1C in March was 8.1%.  She checks her fasting blood sugar and again around 7-8 pm.  She states her readings are 70-100's.  Her A1C does not correlate with cbg's at home and here she is on a significantly less amount of insulin and her cbg's are great.    She admits to drinking regular sodas.  Educated patient on importance of not drinking anything with sugar.  Reviewed CHO's and which foods contain CHO's and The Plate Method.  It was found on chart that patient has been pocketing food.  Wondering if she's eating bad a home considering A1C.  Discussed hypoglycemia at home, symptoms and treatments.  Will  continue to follow while inpatient.  Thank you, Reche Dixon, RN, BSN Diabetes Coordinator Inpatient Diabetes Program [phone] (team pager from 8a-5p)

## 2019-10-23 ENCOUNTER — Inpatient Hospital Stay (HOSPITAL_COMMUNITY): Payer: Medicaid Other

## 2019-10-23 LAB — GLUCOSE, CAPILLARY
Glucose-Capillary: 430 mg/dL — ABNORMAL HIGH (ref 70–99)
Glucose-Capillary: 380 mg/dL — ABNORMAL HIGH (ref 70–99)
Glucose-Capillary: 386 mg/dL — ABNORMAL HIGH (ref 70–99)
Glucose-Capillary: 301 mg/dL — ABNORMAL HIGH (ref 70–99)

## 2019-10-23 LAB — CYTOLOGY - NON PAP

## 2019-10-23 IMAGING — CT CT CHEST W/ CM
2 of 3 series · 15 of 36 positions shown, 18 images · IV contrast (APPLIED)
Comparison: CT abdomen and pelvis [DATE]

CLINICAL DATA: Seizure, abnormal CT abdomen with basilar lung
changes

EXAM:
CT CHEST WITH CONTRAST
TECHNIQUE: Multidetector CT imaging of the chest was performed during
intravenous contrast administration. Sagittal and coronal MPR images
reconstructed from axial data set.
CONTRAST:  75mL OMNIPAQUE IOHEXOL 300 MG/ML  SOLN IV

[Series 3: thorax 2.0 i31f 2 · axial · 0.61mm/px · z∈[+54,+270]mm · 12 of 128 slices shown, 15 images]
[im 10/128  mediastinal]
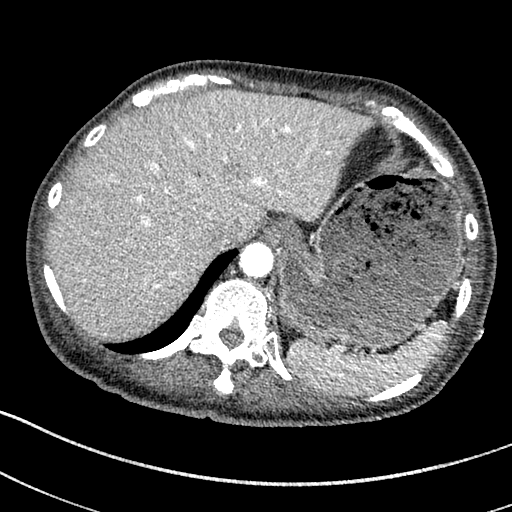
[im 10/128  lung]
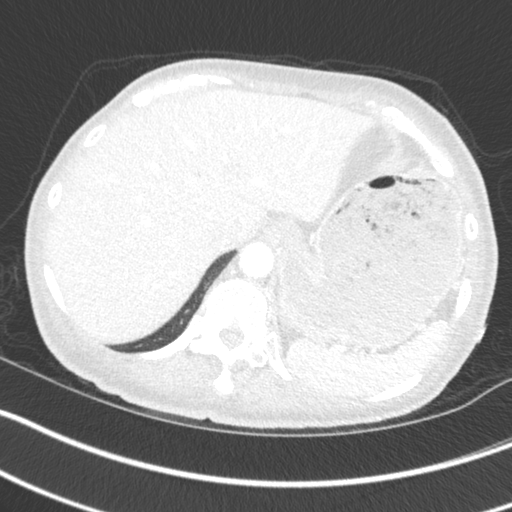
[im 19/128  lung]
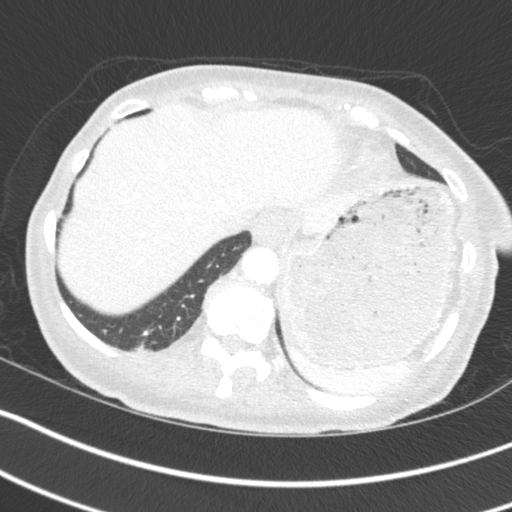
[im 29/128  lung]
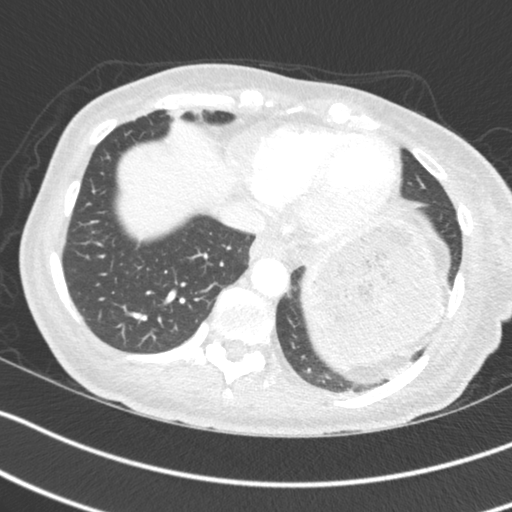
[im 38/128  lung]
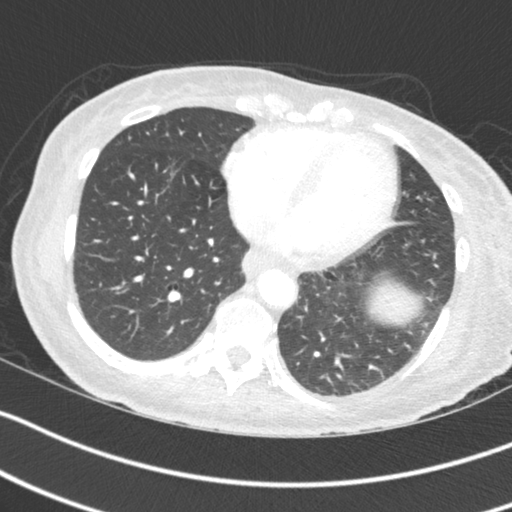
[im 48/128  mediastinal]
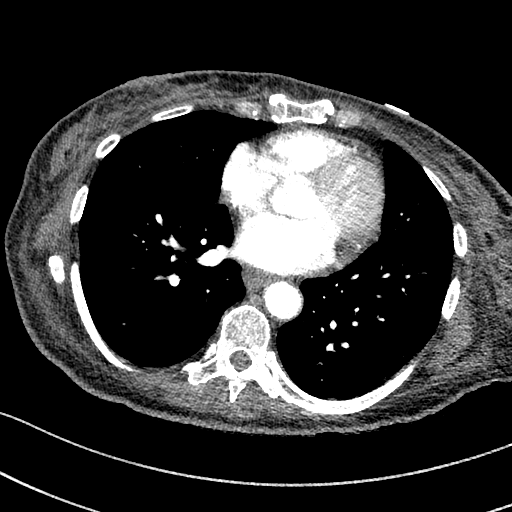
[im 48/128  lung]
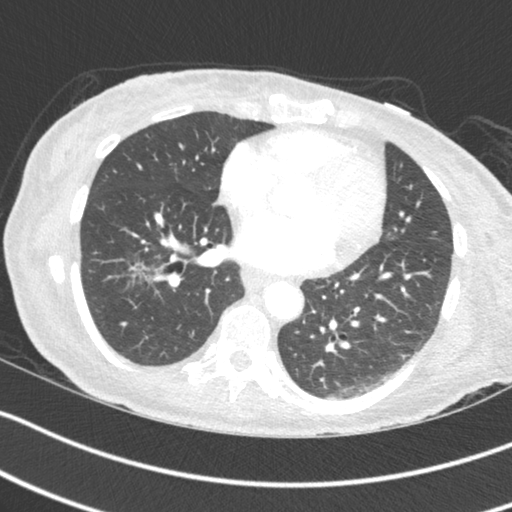
[im 57/128  lung]
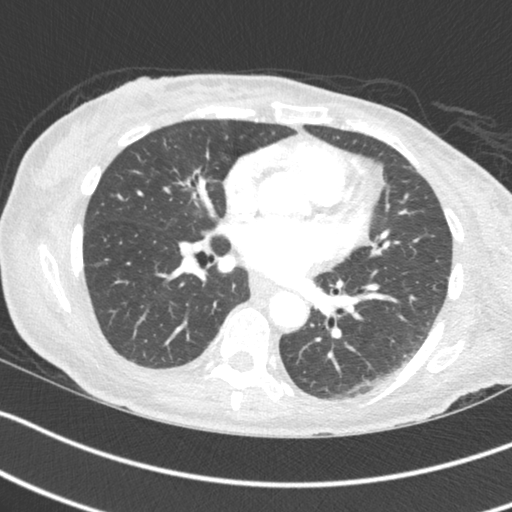
[im 71/128  lung]
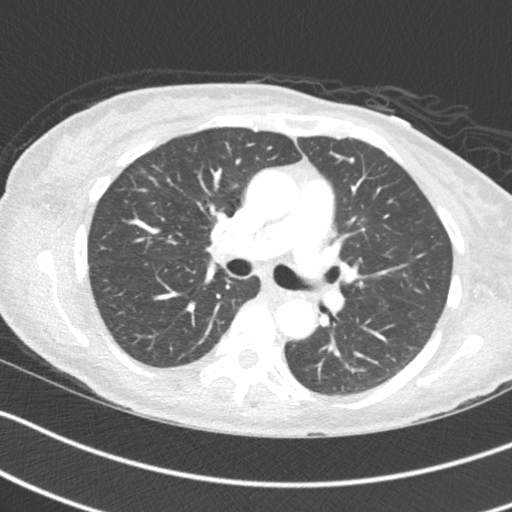
[im 80/128  lung]
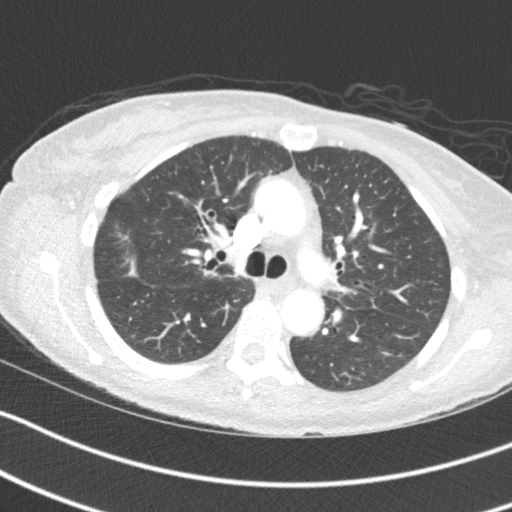
[im 90/128  mediastinal]
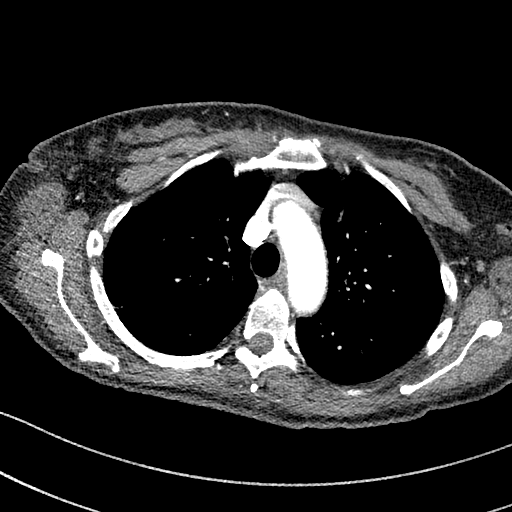
[im 90/128  lung]
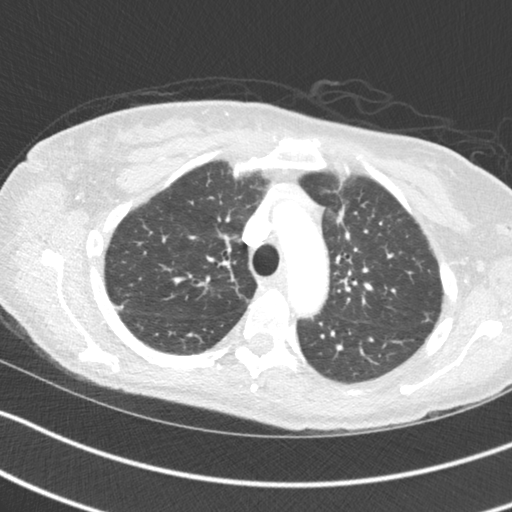
[im 99/128  lung]
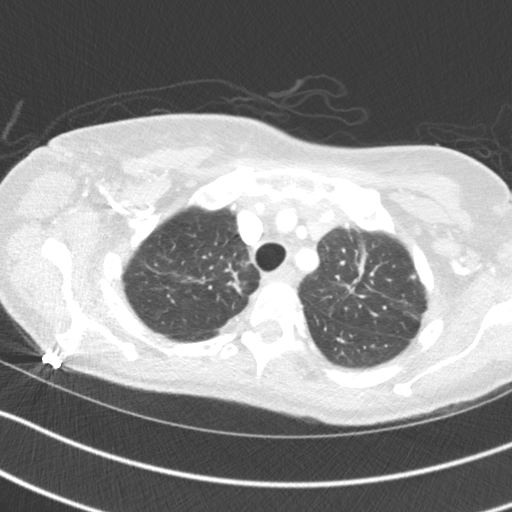
[im 109/128  lung]
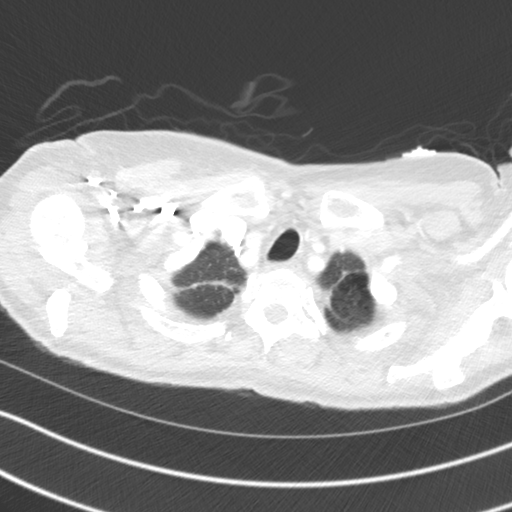
[im 118/128  lung]
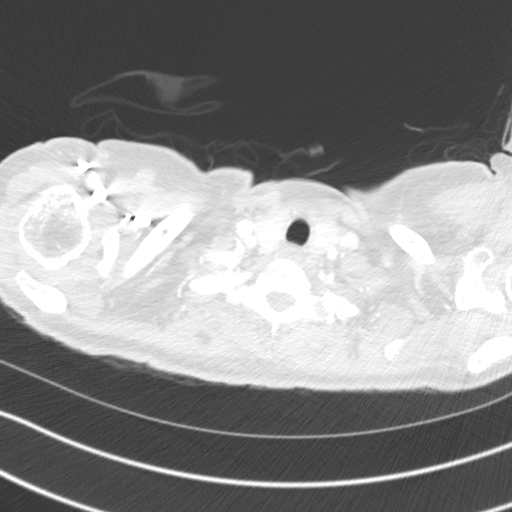

[Series 6: coronal · coronal · 0.50mm/px · 3 of 148 slices shown]
[im 30/148  lung]
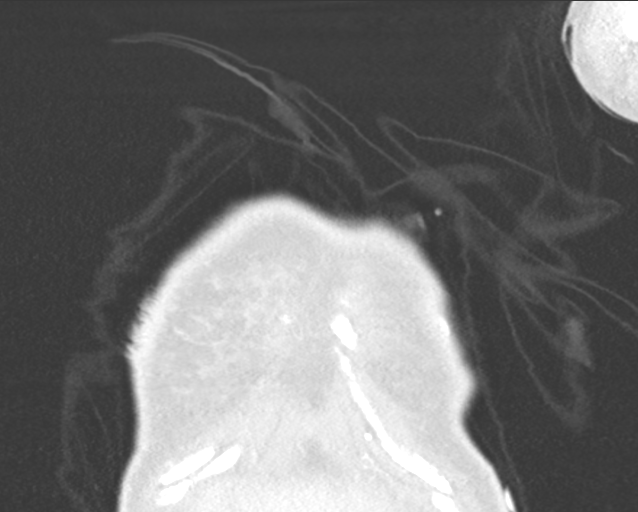
[im 59/148  lung]
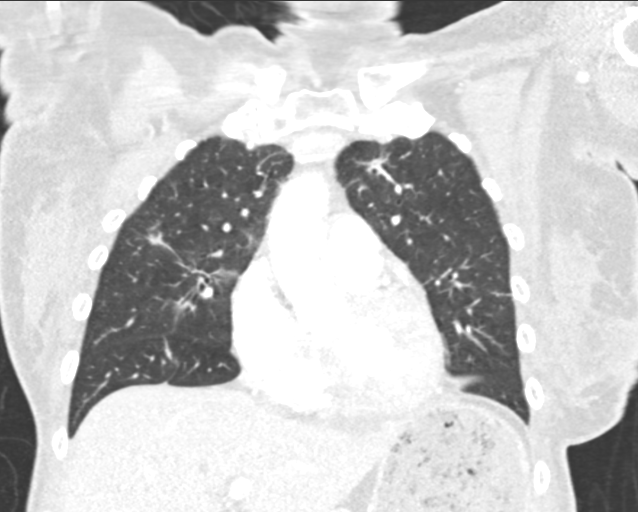
[im 89/148  lung]
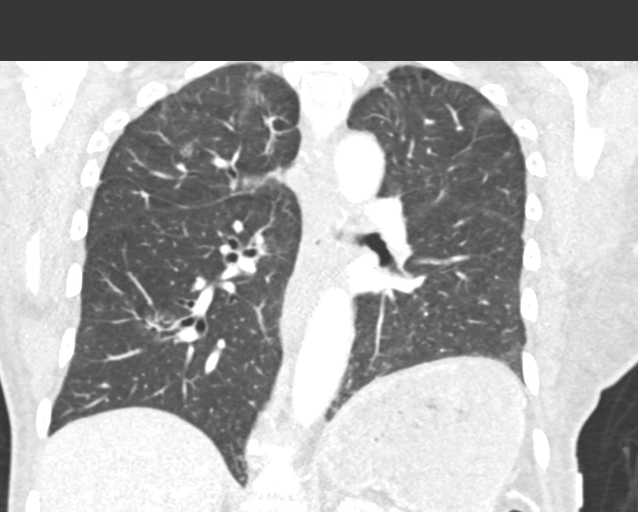

[15 of 36 positions shown; findings below may reference images not displayed]

FINDINGS: Cardiovascular: Atherosclerotic calcification aorta, proximal great
vessels and coronary arteries. Aorta normal caliber. Heart normal
size. No pericardial effusion.

Mediastinum/Nodes: Esophagus unremarkable. Base of cervical region
normal appearance. No thoracic adenopathy.

Lungs/Pleura: Linear areas of atelectasis versus scarring scattered
in both lungs. Patchy areas of peribronchovascular density are seen
in both lungs, some associated with minimal bronchiectasis, favor
post inflammatory. Central peribronchial thickening present. No
segmental consolidation, pleural effusion or pneumothorax. 3 mm LEFT
upper lobe nodule image 30. No additional pulmonary mass/nodule.

Upper Abdomen: Stomach distended by food debris. Remaining upper
abdomen unremarkable

Musculoskeletal: No osseous abnormalities. Scattered chest wall and
axillary soft tissue edema, nonspecific.
IMPRESSION: Scattered areas of atelectasis versus scarring in both lungs with
few scattered areas of peribronchovascular opacity bilaterally, a
few of which are associated with minimal bronchiectasis, favor
postinflammatory scarring.

Single 3 mm nonspecific LEFT upper lobe nodule.

Short-term follow-up CT recommended in 3 months to reassess the
areas of suspected scarring to ensure stability.

Aortic Atherosclerosis ([D2]-[D2]).

## 2019-10-23 MED ORDER — INSULIN ASPART 100 UNIT/ML ~~LOC~~ SOLN
7.0000 [IU] | Freq: Once | SUBCUTANEOUS | Status: AC
  Administered 2019-10-23: 7 [IU] via SUBCUTANEOUS

## 2019-10-23 MED ORDER — INSULIN ASPART PROT & ASPART (70-30 MIX) 100 UNIT/ML ~~LOC~~ SUSP
50.0000 [IU] | Freq: Two times a day (BID) | SUBCUTANEOUS | Status: DC
  Administered 2019-10-23 – 2019-10-24 (×2): 50 [IU] via SUBCUTANEOUS
  Filled 2019-10-23 (×2): qty 10

## 2019-10-23 MED ORDER — IOHEXOL 300 MG/ML  SOLN
100.0000 mL | Freq: Once | INTRAMUSCULAR | Status: AC
  Administered 2019-10-23: 75 mL via INTRAVENOUS

## 2019-10-23 NOTE — Progress Notes (Signed)
PROGRESS NOTE    Andrea [name]  PQZ:[id number] DOB: [birth year] DOA: 10/19/2019 PCP: [name], MD   Brief Narrative:  Patient is a 59 year old female with history of insulin-dependent diabetes mellitus, hyperlipidemia, hypothyroidism who was brought to the emergency department for evaluation of hypoglycemia.  She presented to the emergency department on 6/19 for similar problem and was discharged to home after correction for hypoglycemia.  When EMS arrived at home, patient had 2 focal seizures and was given IV Versed and glucose.  On presentation she had mild left-sided weakness and dysarthria.  She takes 70/30 insulin at home.  On presentation, CT head did not show any acute abnormality but MRI findings are consistent with recent seizure activity.  Neurology was consulted.  Hospital course remarkable for severe constipation.she developed twitching of face muscles on 09/2319.  She underwent LP by neurology suspecting for autoimmune/inflammatory disorder.  Started on high-dose steroids.  PT recommending SNF on DC.  Assessment & Plan:   Active Problems:   Type 1 diabetes mellitus with other specified complication (HCC)   Seizure ([place])   Hypoglycemia   Hypokalemia   Acute encephalopathy: Most likely secondary to hypoglycemia versus seizure.  Currently alert and oriented.  Continue to monitor mental status.  Seizures: New onset.  Neurology was consulted and following.  Started on Keppra and clobazam.  EEG showed epileptiform discharges.   No abnormal enhancement seen on MRI.  She again had twitching of the facial muscles on 10/22/19.  Neurology did LP, started on Solu-Medrol for possible autoimmune/inflammatory disorder/paraneoplastic disorder.we will follow-up CSF fluid panel.  So far normal WBC, protein.  Also ordering CT chest with contrast.  Hypoglycemia/type 1 diabetes: Reported of receiving 80 units of 70/30 insulin on 6/19 morning.  She was given IV fluids with D5.   Hemoglobin A1c of  12.4.  She is on 80 units of 70/30 insulin at home.  We expect that her blood sugars will be uncontrolled because she has been started  on steroids.  We started her on 50 units of 70/30 insulin twice daily.  We will continue to adjust the insulin.  Hypokalemia: Being monitored and supplemented  Hypothyroidism: Continue Synthyroid  History of weight loss: CT abdomen/pelvis without finding of any malignancy.  CT chest showed peribronchial irregular opacities consistent with inflammatory or infectious etiology.Showed a 5 mm subpleural left upper lobe pulmonary nodule. She was a past smoker,we recommend follow up  chest CT  in 12 months .  Severe constipation:   CT abdomen/pelvis shows severe fecal impaction, focal colitis secondary to stool balls, bilateral hydroureteronephrosis secondary to fecal impaction.  Patient having bowel movement now.Continue miralax and senokot daily  Debility/deconditioning: Patient seen by physical therapy and recommended skilled nursing facility on discharge.          DVT prophylaxis:Lovenox Code Status: Full Family Communication: None present at the bedside Status is: Inpatient  Remains inpatient appropriate because:Unsafe d/c plan   Dispo: The patient is from: Home              Anticipated d/c is to: SNF              Anticipated d/c date is: 2-3 days              Patient currently is not medically stable to d/c.  Needs to be cleared by neurology before discharge.      Consultants: neurology  Procedures:eEG  Antimicrobials:  Anti-infectives (From admission, onward)   None      Subjective:  Patient seen and examined the bedside this morning.  Hemodynamically stable.  Comfortable.  She was about to leave for CT chest.  Denies any complaints.  No facial twitching today.  Having bowel movement..  Objective: Vitals:   10/22/19 0526 10/22/19 2036 10/23/19 0013 10/23/19 0317  BP:  128/69 119/60 132/74  Pulse:  100 97 100  Resp:  20 (!) 21 17    Temp: 99.5 F (37.5 C) 98 F (36.7 C) 98.4 F (36.9 C) 98 F (36.7 C)  TempSrc: Oral Oral Oral Oral  SpO2:  100% 99% 100%    Intake/Output Summary (Last 24 hours) at 10/23/2019 0802 Last data filed at 10/23/2019 0500 Gross per 24 hour  Intake 178 ml  Output 500 ml  Net -322 ml   There were no vitals filed for this visit.  Examination:  General exam: Appears calm and comfortable ,Not in distress,average built,weak HEENT:PERRL,Oral mucosa moist, Ear/Nose normal on gross exam Respiratory system: Bilateral equal air entry, normal vesicular breath sounds, no wheezes or crackles  Cardiovascular system: S1 & S2 heard, RRR. No JVD, murmurs, rubs, gallops or clicks. Gastrointestinal system: Abdomen is nondistended, soft and nontender. No organomegaly or masses felt. Normal bowel sounds heard. Central nervous system: Alert and oriented. No focal neurological deficits. Extremities: No edema, no clubbing ,no cyanosis, distal peripheral pulses palpable. Skin: No rashes, lesions or ulcers,no icterus ,no pallor   Data Reviewed: I have personally reviewed following labs and imaging studies  CBC: Recent Labs  Lab 10/18/19 1501 10/19/19 1539 10/19/19 1541 10/20/19 0456 10/21/19 0808  WBC 9.2  --  8.9 8.7 7.7  NEUTROABS 7.5  --  6.8  --   --   HGB 12.5 10.2* 10.2* 9.6* 9.6*  HCT 35.6* 30.0* 31.4* 29.3* 29.7*  MCV 82.8  --  88.7 89.3 89.5  PLT 377  --  329 342 242   Basic Metabolic Panel: Recent Labs  Lab 10/19/19 1541 10/20/19 0456 10/20/19 1709 10/21/19 0808 10/22/19 0845  NA 143 142 145 143 140  K 3.2* 3.0* 3.5 3.0* 3.6  CL 108 111 115* 111 110  CO2 24 23 20* 23 18*  GLUCOSE 155* 111* 100* 187* 153*  BUN 6 <5* <5* <5* <5*  CREATININE 0.76 0.62 0.60 0.59 0.58  CALCIUM 8.7* 8.2* 8.2* 8.1* 8.1*   GFR: Estimated Creatinine Clearance: 63.9 mL/min (by C-G formula based on SCr of 0.58 mg/dL). Liver Function Tests: Recent Labs  Lab 10/18/19 1501 10/19/19 1541  10/20/19 0456  AST 30 31 26   ALT 14 15 15   ALKPHOS 71 59 62  BILITOT 0.8 0.7 0.7  PROT 7.8 6.6 5.9*  ALBUMIN 3.4* 2.6* 2.4*   No results for input(s): LIPASE, AMYLASE in the last 168 hours. No results for input(s): AMMONIA in the last 168 hours. Coagulation Profile: Recent Labs  Lab 10/19/19 1541  INR 1.1   Cardiac Enzymes: No results for input(s): CKTOTAL, CKMB, CKMBINDEX, TROPONINI in the last 168 hours. BNP (last 3 results) No results for input(s): PROBNP in the last 8760 hours. HbA1C: Recent Labs    10/21/19 0808  HGBA1C 12.4*   CBG: Recent Labs  Lab 10/22/19 1135 10/22/19 1756 10/22/19 2143 10/23/19 0618 10/23/19 0621  GLUCAP 145* 171* 165* >600* 430*   Lipid Profile: No results for input(s): CHOL, HDL, LDLCALC, TRIG, CHOLHDL, LDLDIRECT in the last 72 hours. Thyroid Function Tests: No results for input(s): TSH, T4TOTAL, FREET4, T3FREE, THYROIDAB in the last 72 hours. Anemia Panel: No results for input(s):  VITAMINB12, FOLATE, FERRITIN, TIBC, IRON, RETICCTPCT in the last 72 hours. Sepsis Labs: No results for input(s): PROCALCITON, LATICACIDVEN in the last 168 hours.  Recent Results (from the past 240 hour(s))  SARS Coronavirus 2 by RT PCR (hospital order, performed in [facility] hospital lab) Nasopharyngeal Nasopharyngeal Swab     Status: None   Collection Time: 10/19/19  9:30 PM   Specimen: Nasopharyngeal Swab  Result Value Ref Range Status   SARS Coronavirus 2 NEGATIVE NEGATIVE Final    Comment: (NOTE) SARS-CoV-2 target nucleic acids are NOT DETECTED.  The SARS-CoV-2 RNA is generally detectable in upper and lower respiratory specimens during the acute phase of infection. The lowest concentration of SARS-CoV-2 viral copies this assay can detect is 250 copies / mL. A negative result does not preclude SARS-CoV-2 infection and should not be used as the sole basis for treatment or other patient management decisions.  A negative result may occur  with improper specimen collection / handling, submission of specimen other than nasopharyngeal swab, presence of viral mutation(s) within the areas targeted by this assay, and inadequate number of viral copies (<250 copies / mL). A negative result must be combined with clinical observations, patient history, and epidemiological information.  Fact Sheet for Patients:   StrictlyIdeas.no  Fact Sheet for Healthcare Providers: BankingDealers.co.za  This test is not yet approved or  cleared by the [place] FDA and has been authorized for detection and/or diagnosis of SARS-CoV-2 by FDA under an Emergency Use Authorization (EUA).  This EUA will remain in effect (meaning this test can be used) for the duration of the COVID-19 declaration under Section 564(b)(1) of the Act, 21 U.S.C. section 360bbb-3(b)(1), unless the authorization is terminated or revoked sooner.  Performed at [facility], [place] [address] [place], [place] [address]   CSF culture with Stat gram stain     Status: None (Preliminary result)   Collection Time: 10/22/19  3:15 PM   Specimen: CSF; Cerebrospinal Fluid  Result Value Ref Range Status   Specimen Description CSF  Final   Special Requests NONE  Final   Gram Stain   Final    WBC PRESENT, PREDOMINANTLY PMN NO ORGANISMS SEEN CYTOSPIN SMEAR Performed at [facility], [place] [address] [place], [place] [address]    Culture PENDING  Incomplete   Report Status PENDING  Incomplete         Radiology Studies: CT ABDOMEN PELVIS W CONTRAST  Result Date: 10/22/2019 CLINICAL DATA:  Unintended weight loss. EXAM: CT ABDOMEN AND PELVIS WITH CONTRAST TECHNIQUE: Multidetector CT imaging of the abdomen and pelvis was performed using the standard protocol following bolus administration of intravenous contrast. CONTRAST:  131mL OMNIPAQUE IOHEXOL 300 MG/ML  SOLN COMPARISON:  None. FINDINGS: Lower chest: Majority of the  chest is included in the field of view. Scattered linear and bandlike opacities throughout both lungs are typical of scarring. There is a subpleural 5 mm left upper lobe pulmonary nodule, series 6, image 26. Scattered peribronchovascular irregular opacities in the right lower lobe series 6, image 53, right middle lobe, series 6, image 39, left lower lobe, same image, left upper lobe series 6, image 29, and right mid lung same image. No dominant pulmonary mass. Hepatobiliary: Scattered subcentimeter hypodensities in the liver are too small to characterize but likely small cysts or biliary hamartomas. No evidence of suspicious liver lesion. Gallbladder physiologically distended, no calcified stone. No biliary dilatation. Pancreas: No ductal dilatation or inflammation. Spleen: Normal in size without focal  abnormality. Adrenals/Urinary Tract: Normal adrenal glands. There is right hydroureteronephrosis. The ureter is dilated to pelvic brim. There is mild left hydronephrosis and proximal hydroureter, with similar ureteral dilatation. Diminished excretion on delayed phase imaging, right greater than left. No evidence of focal renal mass. Urinary bladder is displaced anteriorly by large stool ball in the rectum. Stomach/Bowel: Large inspissated stool ball distends the rectum with rectal distention of 8.6 cm. Associated rectal wall thickening and perirectal edema. No pneumatosis. Approximately 6.3 cm length area of sigmoid colonic wall thickening with pericolonic edema just proximal to the large volume of rectosigmoid stool. Large volume of stool throughout the remainder of the colon, there is significant sigmoid colonic redundancy. No additional areas of colonic wall thickening. The appendix is not confidently visualized. The stomach is unremarkable. No small bowel obstruction or abnormal distention. No definite small bowel inflammation. Vascular/Lymphatic: Moderate aorto bi-iliac atherosclerosis, advanced for age. No  aortic aneurysm. Patent portal vein. There is no adenopathy in the abdomen or pelvis. Reproductive: Calcified uterine fibroids. The uterus is displaced into the right pelvis and anteriorly by large rectosigmoid stool burden. No evidence of adnexal mass. Other: Perirectal and distal pericolonic edema with small amount of presacral fluid. No other ascites. There is no free air or perforation. Mild generalized body wall edema. Musculoskeletal: Mild L1 superior endplate compression fracture, age indeterminate. No evidence of focal bone lesion. IMPRESSION: 1. Large inspissated stool ball distends the rectum with rectal distention of 8.6 cm. Associated rectal wall thickening and perirectal edema, suspicious for fecal impaction. The stool distended rectum causes mass effect on the urinary bladder, uterus, and distal ureters with bilateral hydroureteronephrosis, right greater than left. 2. Approximately 6.3 cm length area of sigmoid colonic wall thickening with pericolonic edema just proximal to the large volume of rectosigmoid stool. This may represent focal colitis, including stercoral colitis versus colonic neoplasm. 3. Large volume of stool throughout the remainder of the colon. There is significant sigmoid colonic redundancy. 4. Scattered peribronchovascular irregular opacities in the lung bases, likely infectious or inflammatory. There is a 5 mm subpleural left upper lobe pulmonary nodule. No follow-up needed if patient is low-risk (and has no known or suspected primary neoplasm). Non-contrast chest CT can be considered in 12 months if patient is high-risk. This recommendation follows the consensus statement: Guidelines for Management of Incidental Pulmonary Nodules Detected on CT Images: From the Fleischner Society 2017; Radiology 2017; 284:228-243. 5. Mild L1 superior endplate compression fracture, age indeterminate. Aortic Atherosclerosis (ICD10-I70.0). Electronically Signed   By: Keith Rake M.D.   On:  10/22/2019 00:00        Scheduled Meds: . aspirin EC  81 mg Oral Daily  . cloBAZam  5 mg Oral BID  . enoxaparin (LOVENOX) injection  40 mg Subcutaneous Q24H  . insulin aspart  0-6 Units Subcutaneous TID WC  . insulin aspart protamine- aspart  50 Units Subcutaneous BID WC  . levETIRAcetam  1,500 mg Oral BID  . levothyroxine  150 mcg Oral Q0600  . liothyronine  5 mcg Oral Daily  . sodium chloride flush  3 mL Intravenous Once   Continuous Infusions: . methylPREDNISolone (SOLU-MEDROL) injection 1,000 mg (10/22/19 2200)     LOS: 3 days    Time spent: 35 mins.More than 50% of that time was spent in counseling and/or coordination of care.      Shelly Coss, MD [facility] P6/24/2021, 8:02 AM

## 2019-10-23 NOTE — Progress Notes (Signed)
  Speech Language Pathology Treatment: Dysphagia  Patient Details Name: Andrea [name] MRN: [id number] DOB: 03/21/1961 Today's Date: 10/23/2019 Time: 4458-4835 SLP Time Calculation (min) (ACUTE ONLY): 16 min  Assessment / Plan / Recommendation Clinical Impression  Andrea [name] demonstrates improved mentation this date. She was seen with meal tray of Dys 3 solids (mechanical soft/chopped). She was noted with anterior spillage from L, which she was sensate to and wiped i'ly. She had mashed potatoes, roast beef, and cooked carrots with tea. She had mildly prolonged mastication of the beef, but had adequate oral clearance after the swallow. Pt reports good sensation and was noted to i'ly clear L lateral sulci prior to opening her mouth for SLP to check. Continue DYS3 solids at this time. SLP service to follow for upgrade to regular as pt is ready and mentation continues to improve.   HPI HPI: 59 y.o. female with past medical history significant for diabetes insulin-dependent, hyperlipidemia, hypothyroidism, who was just evaluated at [facility] on 6/19 for hypoglycemia, patient was initially nonverbal with generalized weakness at that time.  Hypoglycemia was corrected and she was discharged home.  6/21 family called EMS because patient's blood sugar was again low at 30.  Patient was also noted to have  left-sided weakness. MRI revealed "Mild restricted diffusion in the medial right frontal lobe, right insula, and likely right hippocampus. Differential includes seizure related cortical activity, autoimmune related encephalitis, and less likely viral encephalitis. most consistent with recent seizure activity." Pt passed Yale swallow screen, but was found subsequently to be pocketing food; SLP swallow evaluation was ordered.       SLP Plan  Continue with current plan of care       Recommendations  Diet recommendations: Dysphagia 3 (mechanical soft);Thin liquid Liquids provided via: Straw;Cup Medication  Administration: Whole meds with puree Supervision: Intermittent supervision to cue for compensatory strategies Compensations: Slow rate;Small sips/bites;Lingual sweep for clearance of pocketing;Monitor for anterior loss;Follow solids with liquid Postural Changes and/or Swallow Maneuvers: Seated upright 90 degrees      Oral Care Recommendations: Oral care before and after PO Follow up Recommendations: Skilled Nursing facility SLP Visit Diagnosis: Dysphagia, oral phase (R13.11) Plan: Continue with current plan of care         Landrey Mahurin P. Apryll Hinkle, M.S., CCC-SLP Speech-Language Pathologist Acute Rehabilitation Services Pager: [place] 10/23/2019, 1:23 PM

## 2019-10-23 NOTE — Progress Notes (Signed)
Inpatient Diabetes Program Recommendations  AACE/ADA: New Consensus Statement on Inpatient Glycemic Control (2015)  Target Ranges:  Prepandial:   less than 140 mg/dL      Peak postprandial:   less than 180 mg/dL (1-2 hours)      Critically ill patients:  140 - 180 mg/dL   Lab Results  Component Value Date   GLUCAP 430 (H) 10/23/2019   HGBA1C 12.4 (H) 10/21/2019    Review of Glycemic Control  Results for KAMAIYA, ANTILLA (MRN [id number]) as of  Results for MCKENZE, SLONE (MRN [id number]) as of 10/23/2019 10:55  Ref. Range 10/22/2019 06:07 10/22/2019 10:03 10/22/2019 11:35 10/22/2019 17:56 10/22/2019 21:43 10/23/2019 06:18 10/23/2019 06:21  Glucose-Capillary Latest Ref Range: 70 - 99 mg/dL 129 (H) 137 (H) 145 (H) 171 (H) 165 (H) >600 (HH) 430 (H)   Diabetes history:  DM1  Outpatient Diabetes medications:  Humulin 70/30 80 units qam & 85 units qpm   Current orders for Inpatient glycemic control:  Novolog 0-6 unit tid Lantus 5 units daily  Solumedrol 1,000 mg x5 days started yesterday. Glucose elevated this am.  Consider increasing Lantus to 12-15 units.  May also need to increase Novolog Correction scale.  Thank you, Tama Headings RN, MSN, BC-ADM Inpatient Diabetes Coordinator Team Pager [phone] (8a-5p)

## 2019-10-23 NOTE — Progress Notes (Addendum)
Subjective: No clinical seizures overnight per RN.  No new concerns.  ROS: negative except above  Examination  Vital signs in last 24 hours: Temp:  [98 F (36.7 C)-98.4 F (36.9 C)] 98.4 F (36.9 C) (06/24 1234) Pulse Rate:  [97-111] 111 (06/24 1234) Resp:  [17-21] 17 (06/24 0317) BP: (119-132)/(60-74) 124/74 (06/24 1234) SpO2:  [99 %-100 %] 99 % (06/24 1234)  General: lying in bed,not in apparent distress CVS: pulse-normal rate and rhythm RS: breathing comfortably,CTAB Extremities: normal,warm  Neuro: MS: Alert, orientedto time place and person, follows commands,no evidence of aphasia CN: pupils equal and reactive, EOMI, face symmetric, tongue midline,no sensory neglect or visual neglect on face Motor:4-/5 strength in all 4 extremities Sensory: Left sensory hemineglect Coordination: normal   Basic Metabolic Panel: Recent Labs  Lab 10/19/19 1541 10/19/19 1541 10/20/19 0456 10/20/19 0456 10/20/19 1709 10/21/19 0808 10/22/19 0845  NA 143  --  142  --  145 143 140  K 3.2*  --  3.0*  --  3.5 3.0* 3.6  CL 108  --  111  --  115* 111 110  CO2 24  --  23  --  20* 23 18*  GLUCOSE 155*  --  111*  --  100* 187* 153*  BUN 6  --  <5*  --  <5* <5* <5*  CREATININE 0.76  --  0.62  --  0.60 0.59 0.58  CALCIUM 8.7*   < > 8.2*   < > 8.2* 8.1* 8.1*   < > = values in this interval not displayed.    CBC: Recent Labs  Lab 10/18/19 1501 10/19/19 1539 10/19/19 1541 10/20/19 0456 10/21/19 0808  WBC 9.2  --  8.9 8.7 7.7  NEUTROABS 7.5  --  6.8  --   --   HGB 12.5 10.2* 10.2* 9.6* 9.6*  HCT 35.6* 30.0* 31.4* 29.3* 29.7*  MCV 82.8  --  88.7 89.3 89.5  PLT 377  --  329 342 340     Coagulation Studies: No results for input(s): LABPROT, INR in the last 72 hours.  Imaging CT chest with contrast 10/23/2019: Scattered areas of atelectasis versus scarring in both lungs with few scattered areas of peribronchovascular opacity bilaterally, a few of which are associated with  minimal bronchiectasis, favor postinflammatory scarring.   Single 3 mm nonspecific LEFT upper lobe nodule.  Short-term follow-up CT recommended in 3 months to reassess the areas of suspected scarring to ensure stability.   ASSESSMENT AND PLAN: 59 year old female presented with hyperglycemia (blood glucose 40) and focal seizures.  Focal convulsive status epilepticus (resolved) New onset seizures in setting of hypoglycemia Diabetes with episodes of hypoglycemia -Patient noted to have further focal motor seizures without alteration of awareness -CT Abdo pelvis was performed which showed 6.3 cm area of sigmoid colonic wall thickening with pericolonic edema which could represent focal colitis versus colonic neoplasm.  5 mm subpleural left upper lobe pulmonary nodule was also noted.  Recommendations -Continue Keppra 1500 mg twice daily, clobazam 5 mg twice daily -If patient has any further seizures can increase clobazam to 10 mg every morning  -Lumbar puncture was performed on 10/22/2019, no pleocytosis, autoimmune and paraneoplastic labs sent to [facility] and patient started on Solu-Medrol 1000 mg day 3/5 -Continue seizure precautions including do not drive -As needed IV Ativan for clinical seizure lasting more than 5 minutes -Defer management of diabetes and other comorbidities to primary team   I have spent a total of 25 minuteswith the patient  reviewing hospitalnotes,  test results, labs and examining the patient as well as establishing an assessment and plan that was discussed personally with the patient and team.>50% of time was spent in direct patient care.    Zeb Comfort Epilepsy [facility] For questions after 5pm please refer to AMION to reach the Neurologist on call

## 2019-10-23 NOTE — Progress Notes (Signed)
RN paged [email or web address], advised pt blood sugar at 0621 was 430. Treated with 6 units of insulin per MAR.

## 2019-10-24 LAB — GLUCOSE, CAPILLARY
Glucose-Capillary: 303 mg/dL — ABNORMAL HIGH (ref 70–99)
Glucose-Capillary: 322 mg/dL — ABNORMAL HIGH (ref 70–99)
Glucose-Capillary: 173 mg/dL — ABNORMAL HIGH (ref 70–99)
Glucose-Capillary: 190 mg/dL — ABNORMAL HIGH (ref 70–99)
Glucose-Capillary: 153 mg/dL — ABNORMAL HIGH (ref 70–99)

## 2019-10-24 LAB — MISC LABCORP TEST (SEND OUT)

## 2019-10-24 LAB — HSV DNA BY PCR (REFERENCE LAB)
HSV 1 DNA: NEGATIVE
HSV 2 DNA: NEGATIVE

## 2019-10-24 LAB — CSF IGG: IgG, CSF: 3.1 mg/dL (ref 0.0–6.7)

## 2019-10-24 LAB — OVA + PARASITE EXAM

## 2019-10-24 LAB — O&P RESULT

## 2019-10-24 LAB — VDRL, CSF: VDRL Quant, CSF: NONREACTIVE

## 2019-10-24 LAB — MYELIN BASIC PROTEIN, CSF: Myelin Basic Protein: 0.9 ng/mL (ref 0.0–3.7)

## 2019-10-24 MED ORDER — INSULIN ASPART PROT & ASPART (70-30 MIX) 100 UNIT/ML ~~LOC~~ SUSP
70.0000 [IU] | Freq: Two times a day (BID) | SUBCUTANEOUS | Status: DC
  Administered 2019-10-24 – 2019-10-25 (×3): 70 [IU] via SUBCUTANEOUS
  Filled 2019-10-24: qty 10

## 2019-10-24 NOTE — Progress Notes (Signed)
Subjective: No seizures overnight per patient.  Also checked with patient's nurse and Dr Tawanna Solo  ROS: negative except above  Examination  Vital signs in last 24 hours: Temp:  [97.5 F (36.4 C)-98.5 F (36.9 C)] 98.5 F (36.9 C) (06/25 0724) Pulse Rate:  [100-115] 100 (06/25 0724) Resp:  [0-25] 15 (06/25 0724) BP: (124-148)/(74-92) 148/92 (06/25 0724) SpO2:  [99 %-100 %] 100 % (06/25 0724)  General: lying in bed,not in apparent distress CVS: pulse-normal rate and rhythm RS: breathing comfortably,CTAB Extremities: normal,warm  Neuro: MS: Alert, orientedto time place and person, follows commands,no evidence of aphasia CN: pupils equal and reactive, EOMI, face symmetric, tongue midline,no sensory neglect or visual neglect on face Motor:4-/5 strength in all 4 extremities Sensory: Left sensory hemineglect Coordination: normal  Basic Metabolic Panel: Recent Labs  Lab 10/19/19 1541 10/19/19 1541 10/20/19 0456 10/20/19 0456 10/20/19 1709 10/21/19 0808 10/22/19 0845  NA 143  --  142  --  145 143 140  K 3.2*  --  3.0*  --  3.5 3.0* 3.6  CL 108  --  111  --  115* 111 110  CO2 24  --  23  --  20* 23 18*  GLUCOSE 155*  --  111*  --  100* 187* 153*  BUN 6  --  <5*  --  <5* <5* <5*  CREATININE 0.76  --  0.62  --  0.60 0.59 0.58  CALCIUM 8.7*   < > 8.2*   < > 8.2* 8.1* 8.1*   < > = values in this interval not displayed.    CBC: Recent Labs  Lab 10/18/19 1501 10/19/19 1539 10/19/19 1541 10/20/19 0456 10/21/19 0808  WBC 9.2  --  8.9 8.7 7.7  NEUTROABS 7.5  --  6.8  --   --   HGB 12.5 10.2* 10.2* 9.6* 9.6*  HCT 35.6* 30.0* 31.4* 29.3* 29.7*  MCV 82.8  --  88.7 89.3 89.5  PLT 377  --  329 342 340     Coagulation Studies: No results for input(s): LABPROT, INR in the last 72 hours.  Imaging No new imaging overnight  ASSESSMENT AND PLAN: 59 year old female presented with hyperglycemia (blood glucose 40) and focal seizures.  Focal convulsive status  epilepticus (resolved) New onset seizures -Patient initially presented with focal convulsive status epilepticus in setting of hypoglycemia (blood glucose on arrival 30).  EEG showed multiple seizures which improved after AEDs, but continued to show periodic lateralized epileptiform discharges in the right hemisphere, maximal right frontocentral region.  MRI brain showed restricted diffusion in the right medial frontal cortex, right insula and right hippocampus, no associated enhancement.  Lumbar puncture was performed which did not show any pleocytosis, slightly elevated glucose in setting of diabetes and normal protein.  HSV was negative, cryptococcal antigen was negative.  Autoimmune and paraneoplastic panel was sent to [facility] on 10/22/2019, results pending.  Recommendations -Continue Keppra 1500 mg twice daily,clobazam 5 mg twice daily -If patient has any further seizures can increase clobazam to 10 mg every morning  -Continue Solu-Medrol 1000 mg day 3/5 as patient continues to have left sensory hemineglect and it will take few weeks for send out tests to come back. -Patient will be ready for discharge from neurology standpoint after receiving her last dose of Solu-Medrol and 10/26/2019. -Follow-up with Dr. Ellouise Newer 6 weeks after discharge.  May consider repeat MRI brain and EEG after follow-up to look for resolution of MRI changes and EEG findings as well as  further management. -Continue seizure precautions including do not drive -As needed IV Ativan for clinical seizure lasting more than 5 minutes -Defer management of diabetes and other comorbidities to primary team  Thank you for allowing [place] to participate in the care of this patient.  Neurology will sign off.  Please do not hesitate to contact us if you have any further questions.  I have spent a total of61minuteswith the patient reviewing hospitalnotes, test results, labs and examining the patient as well as establishing an  assessment and plan that was discussed personally with the patient and team.>50% of time was spent in direct patient care.    Zeb Comfort Epilepsy [facility] For questions after 5pm please refer to AMION to reach the Neurologist on call

## 2019-10-24 NOTE — Progress Notes (Signed)
PROGRESS NOTE    Andrea [name]  OZH:[id number] DOB: 11/09/1960 DOA: 10/19/2019 PCP: [name], MD   Brief Narrative:  Patient is a 59 year old female with history of insulin-dependent diabetes mellitus, hyperlipidemia, hypothyroidism who was brought to the emergency department for evaluation of hypoglycemia.  She presented to the emergency department on 6/19 for similar problem and was discharged to home after correction of hypoglycemia.  When EMS arrived at home, patient had 2 focal seizures and was given IV Versed and glucose.  On presentation she had mild left-sided weakness and dysarthria.  She takes 70/30 insulin at home.  On presentation, CT head did not show any acute abnormality but MRI findings are consistent with recent seizure activity.  Neurology was consulted.  Hospital course remarkable for severe constipation.she developed twitching of face muscles on 09/2319.  She underwent LP by neurology suspecting for autoimmune/inflammatory disorder.  Started on high-dose steroids.  PT recommending SNF on DC.  Assessment & Plan:   Active Problems:   Type 1 diabetes mellitus with other specified complication (HCC)   Seizure ([place])   Hypoglycemia   Hypokalemia   Acute encephalopathy: Most likely secondary to hypoglycemia versus seizure.  Currently alert and oriented.  Continue to monitor mental status.  Seizures: New onset.  Neurology was consulted and following.  Started on Keppra and clobazam.  EEG showed epileptiform discharges.   No abnormal enhancement seen on MRI.  She again had twitching of the facial muscles on 10/22/19.  Neurology did LP, started on Solu-Medrol for possible autoimmune/inflammatory disorder/paraneoplastic disorder.we will follow-up CSF fluid panel.  Most of the results are nonassuring but some of those sent to outside labs are pending.  Neurology planning to continue Solu-Medrol 1 g a day for total of 5 doses, day 2 /5  Hypoglycemia/type 1 diabetes: Reported of  receiving 80 units of 70/30 insulin on 6/19 morning.  She was given IV fluids with D5.   Hemoglobin A1c of 12.4.  She is on 80 units of 70/30 insulin at home.  We expect that her blood sugars will be uncontrolled because she has been started  on steroids.  We increased the dose to  70 units.  We will continue to adjust the insulin if needed.  Diabetic coordinator following.  Hypokalemia: Being monitored and supplemented  Hypothyroidism: Continue Synthyroid  History of weight loss: CT abdomen/pelvis without finding of any malignancy.  CT chest showed peribronchial irregular opacities consistent with inflammatory or infectious etiology.Showed a 3 mm  left upper lobe pulmonary nodule. She was a past smoker,we recommend follow up  chest CT  in 3-6 months .  Severe constipation:   CT abdomen/pelvis done here showed severe fecal impaction, focal colitis secondary to stool balls, bilateral hydroureteronephrosis secondary to fecal impaction.  Patient having bowel movement now.Continue miralax and senokot daily.Denied any abdominal pain.  Debility/deconditioning: Patient seen by physical therapy and recommended skilled nursing facility on discharge.          DVT prophylaxis:Lovenox Code Status: Full Family Communication:   Daily discussion with the patient about the plan.  She said no need to call anybody. Status is: Inpatient  Remains inpatient appropriate because:Unsafe d/c plan   Dispo: The patient is from: Home              Anticipated d/c is to: SNF              Anticipated d/c date is: 2-3 days  Patient currently is not medically stable to d/c.  Needs to be cleared by neurology before discharge.Also needs SNF bed      Consultants: neurology  Procedures:EEG  Antimicrobials:  Anti-infectives (From admission, onward)   None      Subjective: Patient seen and examined at the bedside this morning.  Comfortable.  Hemodynamically stable.  No active issues.  No new  seizure episodes.  Alert and oriented   objective: Vitals:   10/24/19 0300 10/24/19 0724 10/24/19 1141 10/24/19 1158  BP: 132/78 (!) 148/92 129/78   Pulse: (!) 107 100 (!) 109 100  Resp: (!) 0 15 (!) 26 19  Temp: 98.4 F (36.9 C) 98.5 F (36.9 C) 98.1 F (36.7 C)   TempSrc: Oral Oral Oral   SpO2: 99% 100% 100%    No intake or output data in the 24 hours ending 10/24/19 1201 There were no vitals filed for this visit.  Examination:  General exam: Appears calm and comfortable ,Not in distress,average built HEENT:PERRL,Oral mucosa moist, Ear/Nose normal on gross exam Respiratory system: Bilateral equal air entry, normal vesicular breath sounds, no wheezes or crackles  Cardiovascular system: S1 & S2 heard, RRR. No JVD, murmurs, rubs, gallops or clicks. Gastrointestinal system: Abdomen is nondistended, soft and nontender. No organomegaly or masses felt. Normal bowel sounds heard. Central nervous system: Alert and oriented. No focal neurological deficits. Extremities: No edema, no clubbing ,no cyanosis Skin: No rashes, lesions or ulcers,no icterus ,no pallor  Data Reviewed: I have personally reviewed following labs and imaging studies  CBC: Recent Labs  Lab 10/18/19 1501 10/19/19 1539 10/19/19 1541 10/20/19 0456 10/21/19 0808  WBC 9.2  --  8.9 8.7 7.7  NEUTROABS 7.5  --  6.8  --   --   HGB 12.5 10.2* 10.2* 9.6* 9.6*  HCT 35.6* 30.0* 31.4* 29.3* 29.7*  MCV 82.8  --  88.7 89.3 89.5  PLT 377  --  329 342 532   Basic Metabolic Panel: Recent Labs  Lab 10/19/19 1541 10/20/19 0456 10/20/19 1709 10/21/19 0808 10/22/19 0845  NA 143 142 145 143 140  K 3.2* 3.0* 3.5 3.0* 3.6  CL 108 111 115* 111 110  CO2 24 23 20* 23 18*  GLUCOSE 155* 111* 100* 187* 153*  BUN 6 <5* <5* <5* <5*  CREATININE 0.76 0.62 0.60 0.59 0.58  CALCIUM 8.7* 8.2* 8.2* 8.1* 8.1*   GFR: Estimated Creatinine Clearance: 63.9 mL/min (by C-G formula based on SCr of 0.58 mg/dL). Liver Function Tests: Recent  Labs  Lab 10/18/19 1501 10/19/19 1541 10/20/19 0456  AST 30 31 26   ALT 14 15 15   ALKPHOS 71 59 62  BILITOT 0.8 0.7 0.7  PROT 7.8 6.6 5.9*  ALBUMIN 3.4* 2.6* 2.4*   No results for input(s): LIPASE, AMYLASE in the last 168 hours. No results for input(s): AMMONIA in the last 168 hours. Coagulation Profile: Recent Labs  Lab 10/19/19 1541  INR 1.1   Cardiac Enzymes: No results for input(s): CKTOTAL, CKMB, CKMBINDEX, TROPONINI in the last 168 hours. BNP (last 3 results) No results for input(s): PROBNP in the last 8760 hours. HbA1C: No results for input(s): HGBA1C in the last 72 hours. CBG: Recent Labs  Lab 10/23/19 1547 10/23/19 2158 10/24/19 0608 10/24/19 0728 10/24/19 1140  GLUCAP 386* 301* 303* 322* 173*   Lipid Profile: No results for input(s): CHOL, HDL, LDLCALC, TRIG, CHOLHDL, LDLDIRECT in the last 72 hours. Thyroid Function Tests: No results for input(s): TSH, T4TOTAL, FREET4, T3FREE, THYROIDAB in  the last 72 hours. Anemia Panel: No results for input(s): VITAMINB12, FOLATE, FERRITIN, TIBC, IRON, RETICCTPCT in the last 72 hours. Sepsis Labs: No results for input(s): PROCALCITON, LATICACIDVEN in the last 168 hours.  Recent Results (from the past 240 hour(s))  SARS Coronavirus 2 by RT PCR (hospital order, performed in [facility] hospital lab) Nasopharyngeal Nasopharyngeal Swab     Status: None   Collection Time: 10/19/19  9:30 PM   Specimen: Nasopharyngeal Swab  Result Value Ref Range Status   SARS Coronavirus 2 NEGATIVE NEGATIVE Final    Comment: (NOTE) SARS-CoV-2 target nucleic acids are NOT DETECTED.  The SARS-CoV-2 RNA is generally detectable in upper and lower respiratory specimens during the acute phase of infection. The lowest concentration of SARS-CoV-2 viral copies this assay can detect is 250 copies / mL. A negative result does not preclude SARS-CoV-2 infection and should not be used as the sole basis for treatment or other patient management  decisions.  A negative result may occur with improper specimen collection / handling, submission of specimen other than nasopharyngeal swab, presence of viral mutation(s) within the areas targeted by this assay, and inadequate number of viral copies (<250 copies / mL). A negative result must be combined with clinical observations, patient history, and epidemiological information.  Fact Sheet for Patients:   StrictlyIdeas.no  Fact Sheet for Healthcare Providers: BankingDealers.co.za  This test is not yet approved or  cleared by the [place] FDA and has been authorized for detection and/or diagnosis of SARS-CoV-2 by FDA under an Emergency Use Authorization (EUA).  This EUA will remain in effect (meaning this test can be used) for the duration of the COVID-19 declaration under Section 564(b)(1) of the Act, 21 U.S.C. section 360bbb-3(b)(1), unless the authorization is terminated or revoked sooner.  Performed at [facility], [place] [address], [place], [place] [address]   Stool culture (children & immunocomp patients)     Status: None (Preliminary result)   Collection Time: 10/21/19  2:34 PM   Specimen: Stool  Result Value Ref Range Status   Salmonella/Shigella Screen Final report  Final   Campylobacter Culture PENDING  Incomplete   E coli, Shiga toxin Assay Negative Negative Final    Comment: (NOTE) Performed At: [facility] [address] [place], [place] [id number] Rush Farmer MD QQ:[id number]   OVA + PARASITE EXAM     Status: None   Collection Time: 10/21/19  2:34 PM   Specimen: Stool  Result Value Ref Range Status   OVA + PARASITE EXAM Final report  Final    Comment: (NOTE) These results were obtained using wet preparation(s) and trichrome stained smear. This test does not include testing for Cryptosporidium parvum, Cyclospora, or Microsporidia. Performed At: [place], [place] [id number] Caprice Red MD [id number]    Source of Sample STOOL  Final    Comment: Performed at [facility], [place] [address], [place], [place] [address]  STOOL CULTURE REFLEX - RSASHR     Status: None   Collection Time: 10/21/19  2:34 PM  Result Value Ref Range Status   Stool Culture result 1 (RSASHR) Comment  Final    Comment: (NOTE) No Salmonella or Shigella recovered. Performed At: [facility] [place], [place] [id number] Rush Farmer MD YH:[id number]   CSF culture with Stat gram stain     Status: None (Preliminary result)   Collection Time: 10/22/19  3:15 PM   Specimen: CSF; Cerebrospinal  Fluid  Result Value Ref Range Status   Specimen Description CSF  Final   Special Requests NONE  Final   Gram Stain   Final    WBC PRESENT, PREDOMINANTLY PMN NO ORGANISMS SEEN CYTOSPIN SMEAR    Culture   Final    NO GROWTH 2 DAYS Performed at [facility] [address], [place], [place] [address]    Report Status PENDING  Incomplete         Radiology Studies: CT CHEST W CONTRAST  Result Date: 10/23/2019 CLINICAL DATA:  Seizure, abnormal CT abdomen with basilar lung changes EXAM: CT CHEST WITH CONTRAST TECHNIQUE: Multidetector CT imaging of the chest was performed during intravenous contrast administration. Sagittal and coronal MPR images reconstructed from axial data set. CONTRAST:  79mL OMNIPAQUE IOHEXOL 300 MG/ML  SOLN IV COMPARISON:  CT abdomen and pelvis 10/21/2019 FINDINGS: Cardiovascular: Atherosclerotic calcification aorta, proximal great vessels and coronary arteries. Aorta normal caliber. Heart normal size. No pericardial effusion. Mediastinum/Nodes: Esophagus unremarkable. Base of cervical region normal appearance. No thoracic adenopathy. Lungs/Pleura: Linear areas of atelectasis versus scarring scattered in both lungs. Patchy areas of peribronchovascular density are seen in both lungs, some associated with minimal  bronchiectasis, favor post inflammatory. Central peribronchial thickening present. No segmental consolidation, pleural effusion or pneumothorax. 3 mm LEFT upper lobe nodule image 30. No additional pulmonary mass/nodule. Upper Abdomen: Stomach distended by food debris. Remaining upper abdomen unremarkable Musculoskeletal: No osseous abnormalities. Scattered chest wall and axillary soft tissue edema, nonspecific. IMPRESSION: Scattered areas of atelectasis versus scarring in both lungs with few scattered areas of peribronchovascular opacity bilaterally, a few of which are associated with minimal bronchiectasis, favor postinflammatory scarring. Single 3 mm nonspecific LEFT upper lobe nodule. Short-term follow-up CT recommended in 3 months to reassess the areas of suspected scarring to ensure stability. Aortic Atherosclerosis (ICD10-I70.0). Electronically Signed   By: Lavonia Dana M.D.   On: 10/23/2019 13:00        Scheduled Meds: . aspirin EC  81 mg Oral Daily  . cloBAZam  5 mg Oral BID  . enoxaparin (LOVENOX) injection  40 mg Subcutaneous Q24H  . insulin aspart  0-6 Units Subcutaneous TID WC  . insulin aspart protamine- aspart  70 Units Subcutaneous BID WC  . levETIRAcetam  1,500 mg Oral BID  . levothyroxine  150 mcg Oral Q0600  . liothyronine  5 mcg Oral Daily  . sodium chloride flush  3 mL Intravenous Once   Continuous Infusions: . methylPREDNISolone (SOLU-MEDROL) injection 1,000 mg (10/23/19 2138)     LOS: 4 days    Time spent: 35 mins.More than 50% of that time was spent in counseling and/or coordination of care.      Shelly Coss, MD [facility] P6/25/2021, 12:01 PM

## 2019-10-25 DIAGNOSIS — E876 Hypokalemia: Secondary | ICD-10-CM

## 2019-10-25 DIAGNOSIS — E1069 Type 1 diabetes mellitus with other specified complication: Secondary | ICD-10-CM

## 2019-10-25 LAB — BASIC METABOLIC PANEL
Anion gap: 10 (ref 5–15)
BUN: 10 mg/dL (ref 6–20)
CO2: 25 mmol/L (ref 22–32)
Calcium: 8.7 mg/dL — ABNORMAL LOW (ref 8.9–10.3)
Chloride: 106 mmol/L (ref 98–111)
Creatinine, Ser: 0.67 mg/dL (ref 0.44–1.00)
GFR calc Af Amer: >60 mL/min (ref >60)
GFR calc non Af Amer: >60 mL/min (ref >60)
Glucose, Bld: 63 mg/dL — ABNORMAL LOW (ref 70–99)
Potassium: 3.2 mmol/L — ABNORMAL LOW (ref 3.5–5.1)
Sodium: 141 mmol/L (ref 135–145)

## 2019-10-25 LAB — CBC WITH DIFFERENTIAL/PLATELET
Abs Immature Granulocytes: 0.16 10*3/uL — ABNORMAL HIGH (ref 0.00–0.07)
Basophils Absolute: 0 10*3/uL (ref 0.0–0.1)
Basophils Relative: 0 %
Eosinophils Absolute: 0 10*3/uL (ref 0.0–0.5)
Eosinophils Relative: 0 %
HCT: 27 % — ABNORMAL LOW (ref 36.0–46.0)
Hemoglobin: 9 g/dL — ABNORMAL LOW (ref 12.0–15.0)
Immature Granulocytes: 2 %
Lymphocytes Relative: 13 %
Lymphs Abs: 1.1 10*3/uL (ref 0.7–4.0)
MCH: 28.7 pg (ref 26.0–34.0)
MCHC: 33.3 g/dL (ref 30.0–36.0)
MCV: 86 fL (ref 80.0–100.0)
Monocytes Absolute: 0.2 10*3/uL (ref 0.1–1.0)
Monocytes Relative: 3 %
Neutro Abs: 7.1 10*3/uL (ref 1.7–7.7)
Neutrophils Relative %: 82 %
Platelets: 445 10*3/uL — ABNORMAL HIGH (ref 150–400)
RBC: 3.14 MIL/uL — ABNORMAL LOW (ref 3.87–5.11)
RDW: 13.4 % (ref 11.5–15.5)
WBC: 8.6 10*3/uL (ref 4.0–10.5)
nRBC: 0 % (ref 0.0–0.2)

## 2019-10-25 LAB — GLUCOSE, CAPILLARY
Glucose-Capillary: 84 mg/dL (ref 70–99)
Glucose-Capillary: 102 mg/dL — ABNORMAL HIGH (ref 70–99)
Glucose-Capillary: 167 mg/dL — ABNORMAL HIGH (ref 70–99)
Glucose-Capillary: 180 mg/dL — ABNORMAL HIGH (ref 70–99)
Glucose-Capillary: 149 mg/dL — ABNORMAL HIGH (ref 70–99)
Glucose-Capillary: 69 mg/dL — ABNORMAL LOW (ref 70–99)
Glucose-Capillary: 76 mg/dL (ref 70–99)

## 2019-10-25 LAB — STOOL CULTURE: E coli, Shiga toxin Assay: NEGATIVE

## 2019-10-25 LAB — CSF CULTURE W GRAM STAIN: Culture: NO GROWTH

## 2019-10-25 LAB — STOOL CULTURE REFLEX - RSASHR

## 2019-10-25 LAB — STOOL CULTURE REFLEX - CMPCXR

## 2019-10-25 MED ORDER — POTASSIUM CHLORIDE 20 MEQ PO PACK
40.0000 meq | PACK | Freq: Once | ORAL | Status: AC
  Administered 2019-10-25: 40 meq via ORAL
  Filled 2019-10-25: qty 2

## 2019-10-25 MED ORDER — POTASSIUM CHLORIDE CRYS ER 20 MEQ PO TBCR
40.0000 meq | EXTENDED_RELEASE_TABLET | Freq: Once | ORAL | Status: AC
  Administered 2019-10-25: 40 meq via ORAL
  Filled 2019-10-25: qty 2

## 2019-10-25 MED ORDER — DEXTROSE 50 % IV SOLN
25.0000 mL | Freq: Once | INTRAVENOUS | Status: AC
  Administered 2019-10-25: 25 mL via INTRAVENOUS
  Filled 2019-10-25: qty 50

## 2019-10-25 NOTE — Plan of Care (Signed)
  Problem: Education: Goal: Knowledge of General Education information will improve Description: Including pain rating scale, medication(s)/side effects and non-pharmacologic comfort measures Outcome: Progressing   Problem: Health Behavior/Discharge Planning: Goal: Ability to manage health-related needs will improve Outcome: Progressing   Problem: Clinical Measurements: Goal: Ability to maintain clinical measurements within normal limits will improve Outcome: Progressing   Problem: Activity: Goal: Risk for activity intolerance will decrease Outcome: Progressing   Problem: Nutrition: Goal: Adequate nutrition will be maintained Outcome: Progressing   Problem: Coping: Goal: Level of anxiety will decrease Outcome: Progressing   Problem: Elimination: Goal: Will not experience complications related to bowel motility Outcome: Progressing   Problem: Pain Managment: Goal: General experience of comfort will improve Outcome: Progressing   Problem: Safety: Goal: Ability to remain free from injury will improve Outcome: Progressing   Problem: Skin Integrity: Goal: Risk for impaired skin integrity will decrease Outcome: Progressing   Problem: Education: Goal: Expressions of having a comfortable level of knowledge regarding the disease process will increase Outcome: Progressing   Problem: Coping: Goal: Ability to adjust to condition or change in health will improve Outcome: Progressing   Problem: Health Behavior/Discharge Planning: Goal: Compliance with prescribed medication regimen will improve Outcome: Progressing   Problem: Medication: Goal: Risk for medication side effects will decrease Outcome: Progressing   Problem: Clinical Measurements: Goal: Complications related to the disease process, condition or treatment will be avoided or minimized Outcome: Progressing   Problem: Safety: Goal: Verbalization of understanding the information provided will improve Outcome:  Progressing   Problem: Self-Concept: Goal: Level of anxiety will decrease Outcome: Progressing

## 2019-10-25 NOTE — Progress Notes (Addendum)
PROGRESS NOTE    Andrea [name]  ZOX:[id number] DOB: 10/25/1960 DOA: 10/19/2019 PCP: [name], MD   Brief Narrative:  Patient is a 59 year old female with history of insulin-dependent diabetes mellitus, hyperlipidemia, hypothyroidism who was brought to the emergency department for evaluation of hypoglycemia.  She presented to the emergency department on 6/19 for similar problem and was discharged to home after correction of hypoglycemia.  When EMS arrived at home, patient had 2 focal seizures and was given IV Versed and glucose.  On presentation she had mild left-sided weakness and dysarthria.  She takes 70/30 insulin at home.  On presentation, CT head did not show any acute abnormality but MRI findings are consistent with recent seizure activity.  Neurology was consulted.  Hospital course remarkable for severe constipation.she developed twitching of face muscles on 09/2319.  She underwent LP by neurology suspecting for autoimmune/inflammatory disorder.  Started on high-dose steroids.  PT recommending SNF on DC   Assessment & Plan:   Active Problems:   Type 1 diabetes mellitus with other specified complication (HCC)   Seizure ([place])   Hypoglycemia   Hypokalemia   Acute encephalopathy: Most likely secondary to hypoglycemia versus seizure.  Currently alert and oriented.  Continue to monitor mental status.  Seizures: New onset.  Neurology was consulted and following.  Started on Keppra and clobazam.  EEG showed epileptiform discharges.   No abnormal enhancement seen on MRI.  She again had twitching of the facial muscles on 10/22/19.  Neurology did LP, started on Solu-Medrol for possible autoimmune/inflammatory disorder/paraneoplastic disorder.we will follow-up CSF fluid panel.  Most of the results are nonassuring but some of those sent to outside labs are pending.  Neurology planning to continue Solu-Medrol 1 g a day for total of 5 doses, day 4/5  Hypoglycemia/type 1 diabetes: Reported of  receiving 80 units of 70/30 insulin on 6/19 morning.  She was given IV fluids with D5.   Hemoglobin A1c of 12.4.  She is on 80 units of 70/30 insulin at home.  We expect that her blood sugars will be uncontrolled because she has been started  on steroids.  We increased the dose to  70 units.  We will continue to adjust the insulin if needed.  Diabetic coordinator following.  Hypokalemia: Being monitored and supplemented  Hypothyroidism: Continue Synthyroid  History of weight loss: CT abdomen/pelvis without finding of any malignancy.  CT chest showed peribronchial irregular opacities consistent with inflammatory or infectious etiology.Showed a 3 mm  left upper lobe pulmonary nodule. She was a past smoker,we recommend follow up  chest CT  in 3-6 months .  Severe constipation:   CT abdomen/pelvis done here showed severe fecal impaction, focal colitis secondary to stool balls, bilateral hydroureteronephrosis secondary to fecal impaction.  Patient having bowel movement now.Continue miralax and senokot daily.Denied any abdominal pain.  Debility/deconditioning: Patient seen by physical therapy and recommended skilled nursing facility on discharge.  DVT prophylaxis: Lovenox SQ  Code Status: full    Code Status Orders  (From admission, onward)         Start     Ordered   10/19/19 2054  Full code  Continuous        10/19/19 2053        Code Status History    Date Active Date Inactive Code Status Order ID Comments User Context   06/14/2017 1424 06/16/2017 1429 Full Code [id number]  Olean Ree, MD Inpatient   Advance Care Planning Activity     Family Communication:  niece at bedside  Disposition Plan:    Status is: Inpatient  Remains inpatient appropriate because:IV treatments appropriate due to intensity of illness or inability to take PO   Dispo: The patient is from: Home              Anticipated d/c is to: TBD              Anticipated d/c date is: 2 days              Patient  currently is not medically stable to d/c.       Consults called: NEURO Admission status: Inpatient   Consultants:   AS ABOVE  Procedures:  CT CHEST W CONTRAST  Result Date: 10/23/2019 CLINICAL DATA:  Seizure, abnormal CT abdomen with basilar lung changes EXAM: CT CHEST WITH CONTRAST TECHNIQUE: Multidetector CT imaging of the chest was performed during intravenous contrast administration. Sagittal and coronal MPR images reconstructed from axial data set. CONTRAST:  35mL OMNIPAQUE IOHEXOL 300 MG/ML  SOLN IV COMPARISON:  CT abdomen and pelvis 10/21/2019 FINDINGS: Cardiovascular: Atherosclerotic calcification aorta, proximal great vessels and coronary arteries. Aorta normal caliber. Heart normal size. No pericardial effusion. Mediastinum/Nodes: Esophagus unremarkable. Base of cervical region normal appearance. No thoracic adenopathy. Lungs/Pleura: Linear areas of atelectasis versus scarring scattered in both lungs. Patchy areas of peribronchovascular density are seen in both lungs, some associated with minimal bronchiectasis, favor post inflammatory. Central peribronchial thickening present. No segmental consolidation, pleural effusion or pneumothorax. 3 mm LEFT upper lobe nodule image 30. No additional pulmonary mass/nodule. Upper Abdomen: Stomach distended by food debris. Remaining upper abdomen unremarkable Musculoskeletal: No osseous abnormalities. Scattered chest wall and axillary soft tissue edema, nonspecific. IMPRESSION: Scattered areas of atelectasis versus scarring in both lungs with few scattered areas of peribronchovascular opacity bilaterally, a few of which are associated with minimal bronchiectasis, favor postinflammatory scarring. Single 3 mm nonspecific LEFT upper lobe nodule. Short-term follow-up CT recommended in 3 months to reassess the areas of suspected scarring to ensure stability. Aortic Atherosclerosis (ICD10-I70.0). Electronically Signed   By: Lavonia Dana M.D.   On: 10/23/2019  13:00   MR BRAIN WO CONTRAST  Result Date: 10/19/2019 CLINICAL DATA:  Stroke follow-up. Left-sided weakness. Hypoglycemia. Witnessed seizures by EMS. Clinically suspected Todd's paralysis. EXAM: MRI HEAD WITHOUT CONTRAST TECHNIQUE: Multiplanar, multiecho pulse sequences of the brain and surrounding structures were obtained without intravenous contrast. COMPARISON:  Head CT 10/19/2019 FINDINGS: The study is mildly motion degraded. Brain: There is mild restricted diffusion involving cortex in the medial right frontal lobe, right insula, and likely right hippocampus which does not conform to a vascular territory and is without associated edema. No intracranial hemorrhage, mass, midline shift, or extra-axial fluid collection is identified. No significant white matter disease is seen for age. The ventricles and sulci are normal. Vascular: Major intracranial vascular flow voids are preserved. Skull and upper cervical spine: Unremarkable bone marrow signal para Sinuses/Orbits: Bilateral cataract extraction. Small mucous retention cysts in the maxillary sinuses. Clear mastoid air cells. Other: None. IMPRESSION: Mild restricted diffusion in the medial right frontal lobe, right insula, and likely right hippocampus most consistent with recent seizure activity. Electronically Signed   By: Logan Bores M.D.   On: 10/19/2019 16:48   MR BRAIN W CONTRAST  Result Date: 10/20/2019 CLINICAL DATA:  Seizure.  Abnormal MRI. EXAM: MRI HEAD WITH CONTRAST TECHNIQUE: Multiplanar, multiecho pulse sequences of the brain and surrounding structures were obtained with intravenous contrast. CONTRAST:  49mL GADAVIST GADOBUTROL 1 MMOL/ML IV  SOLN COMPARISON:  MRI head without contrast 05/21/2019 FINDINGS: Brain: Review of the MRI from yesterday reveals restricted diffusion in the right medial frontal cortex, also in the right insula and right hippocampus. Note that the lesions are best seen on diffusion-weighted imaging with no significant  associated FLAIR cortical edema. These findings are unilateral. No abnormal enhancement on today's study. Ventricle size is normal. Diffusion-weighted imaging was not repeated today IMPRESSION: Normal enhancement of the brain. Based on the findings yesterday, differential includes seizure related cortical activity, autoimmune related encephalitis, and less likely viral encephalitis. Consider lumbar puncture. Short-term follow-up MRI with contrast may be helpful. Electronically Signed   By: Franchot Gallo M.D.   On: 10/20/2019 16:40   CT ABDOMEN PELVIS W CONTRAST  Result Date: 10/22/2019 CLINICAL DATA:  Unintended weight loss. EXAM: CT ABDOMEN AND PELVIS WITH CONTRAST TECHNIQUE: Multidetector CT imaging of the abdomen and pelvis was performed using the standard protocol following bolus administration of intravenous contrast. CONTRAST:  171mL OMNIPAQUE IOHEXOL 300 MG/ML  SOLN COMPARISON:  None. FINDINGS: Lower chest: Majority of the chest is included in the field of view. Scattered linear and bandlike opacities throughout both lungs are typical of scarring. There is a subpleural 5 mm left upper lobe pulmonary nodule, series 6, image 26. Scattered peribronchovascular irregular opacities in the right lower lobe series 6, image 53, right middle lobe, series 6, image 39, left lower lobe, same image, left upper lobe series 6, image 29, and right mid lung same image. No dominant pulmonary mass. Hepatobiliary: Scattered subcentimeter hypodensities in the liver are too small to characterize but likely small cysts or biliary hamartomas. No evidence of suspicious liver lesion. Gallbladder physiologically distended, no calcified stone. No biliary dilatation. Pancreas: No ductal dilatation or inflammation. Spleen: Normal in size without focal abnormality. Adrenals/Urinary Tract: Normal adrenal glands. There is right hydroureteronephrosis. The ureter is dilated to pelvic brim. There is mild left hydronephrosis and proximal  hydroureter, with similar ureteral dilatation. Diminished excretion on delayed phase imaging, right greater than left. No evidence of focal renal mass. Urinary bladder is displaced anteriorly by large stool ball in the rectum. Stomach/Bowel: Large inspissated stool ball distends the rectum with rectal distention of 8.6 cm. Associated rectal wall thickening and perirectal edema. No pneumatosis. Approximately 6.3 cm length area of sigmoid colonic wall thickening with pericolonic edema just proximal to the large volume of rectosigmoid stool. Large volume of stool throughout the remainder of the colon, there is significant sigmoid colonic redundancy. No additional areas of colonic wall thickening. The appendix is not confidently visualized. The stomach is unremarkable. No small bowel obstruction or abnormal distention. No definite small bowel inflammation. Vascular/Lymphatic: Moderate aorto bi-iliac atherosclerosis, advanced for age. No aortic aneurysm. Patent portal vein. There is no adenopathy in the abdomen or pelvis. Reproductive: Calcified uterine fibroids. The uterus is displaced into the right pelvis and anteriorly by large rectosigmoid stool burden. No evidence of adnexal mass. Other: Perirectal and distal pericolonic edema with small amount of presacral fluid. No other ascites. There is no free air or perforation. Mild generalized body wall edema. Musculoskeletal: Mild L1 superior endplate compression fracture, age indeterminate. No evidence of focal bone lesion. IMPRESSION: 1. Large inspissated stool ball distends the rectum with rectal distention of 8.6 cm. Associated rectal wall thickening and perirectal edema, suspicious for fecal impaction. The stool distended rectum causes mass effect on the urinary bladder, uterus, and distal ureters with bilateral hydroureteronephrosis, right greater than left. 2. Approximately 6.3 cm length area of sigmoid  colonic wall thickening with pericolonic edema just proximal to  the large volume of rectosigmoid stool. This may represent focal colitis, including stercoral colitis versus colonic neoplasm. 3. Large volume of stool throughout the remainder of the colon. There is significant sigmoid colonic redundancy. 4. Scattered peribronchovascular irregular opacities in the lung bases, likely infectious or inflammatory. There is a 5 mm subpleural left upper lobe pulmonary nodule. No follow-up needed if patient is low-risk (and has no known or suspected primary neoplasm). Non-contrast chest CT can be considered in 12 months if patient is high-risk. This recommendation follows the consensus statement: Guidelines for Management of Incidental Pulmonary Nodules Detected on CT Images: From the Fleischner Society 2017; Radiology 2017; 284:228-243. 5. Mild L1 superior endplate compression fracture, age indeterminate. Aortic Atherosclerosis (ICD10-I70.0). Electronically Signed   By: Keith Rake M.D.   On: 10/22/2019 00:00   DG CHEST PORT 1 VIEW  Result Date: 10/19/2019 CLINICAL DATA:  59 year old female with left-sided weakness. Concern for stroke. EXAM: PORTABLE CHEST 1 VIEW COMPARISON:  Chest radiograph dated 01/14/2005. FINDINGS: No focal consolidation, pleural effusion or pneumothorax. The cardiac silhouette is within limits. No acute osseous pathology. IMPRESSION: No active disease. Electronically Signed   By: Anner Crete M.D.   On: 10/19/2019 19:28   EEG adult  Result Date: 10/19/2019 Lora Havens, MD     10/19/2019  8:23 PM Patient Name: Adelaide Pfefferkorn MRN: [id number] Epilepsy Attending: Lora Havens Referring Physician/Provider: Dr Amie [place] Date: 10/19/2019 Duration: 30.57 mins Patient history: 59yo F who presented with ams and seizure. EEG to evaluate for seizure, Level of alertness: Awake AEDs during EEG study:  keppra Technical aspects: This EEG study was done with scalp electrodes positioned according to the 10-20 International system of electrode placement.  Electrical activity was acquired at a sampling rate of 500Hz  and reviewed with a high frequency filter of 70Hz  and a low frequency filter of 1Hz . EEG data were recorded continuously and digitally stored. Description: No posterior dominant rhythm was seen. EEG showed generalize, maximal bifrontal periodic epileptiform discharges with triphasic morphology at 0.25-0.5Hz . EEG also showed continuous generalized 8-9Hz  alpha activity.  Hyperventilation and photic stimulation were not performed.   ABNORMALITY -Periodic epileptiform discharges with triphasic morphology, generalized, maximal bifrontal IMPRESSION: This study showed evidence of generalized epileptogenicity with epileptiform discharges at 0.25-0.5hz  which is on the ictal-interictal continuum. No definite seizures were seen throughout the recording. Dr Cheral Marker was notified. Priyanka Barbra Sarks   Overnight EEG with video  Result Date: 10/20/2019 Lora Havens, MD     10/21/2019  1:03 PM Patient Name: Brynn Mulgrew MRN: [id number] Epilepsy Attending: Lora Havens Referring Physician/Provider: Dr Milus Banister Duration: 10/19/2019 2027 to 10/20/2019 2027  Patient history: 59yo F who presented with ams and seizure. EEG to evaluate for seizure,  Level of alertness: Awake  AEDs during EEG study:  keppra  Technical aspects: This EEG study was done with scalp electrodes positioned according to the 10-20 International system of electrode placement. Electrical activity was acquired at a sampling rate of 500Hz  and reviewed with a high frequency filter of 70Hz  and a low frequency filter of 1Hz . EEG data were recorded continuously and digitally stored.  Description: No posterior dominant rhythm was seen. EEG initially showed generalized, maximal bifrontal periodic epileptiform discharges with triphasic morphology at 0.25-0.5Hz . Gradually, eeg showed right frontocentral periodic epileptiform discharges at 1Hz . EEG also showed continuous generalized 8-9Hz  alpha  activity. Event button was pressed multiple times during which patient was noted to have  left side face twitching, lasting about 30 seconds to one minute. Concomitant eeg showed rhythmic sharply contoured 5-9h generalized theta-alpha activity which evolved into 3-5Hz  theta-delta activity.  ABNORMALITY - Seizure, generalized - Periodic epileptiform discharges with triphasic morphology, generalized, maximal bifrontal - Continuous slow, generalized  IMPRESSION: This study showed multiple seizures with generalized onset during which patient was noted to have left face twitching, lasting 30 seconds to one minute. Additionally, there are epileptiform discharges at 0.25-0.5hz  which is on the ictal-interictal continuum as well as moderate diffuse encephalopathy, non specific to etiology.  Lora Havens   CT HEAD CODE STROKE WO CONTRAST  Result Date: 10/19/2019 CLINICAL DATA:  Code stroke.  Left-sided weakness. EXAM: CT HEAD WITHOUT CONTRAST TECHNIQUE: Contiguous axial images were obtained from the base of the skull through the vertex without intravenous contrast. COMPARISON:  None. FINDINGS: The study is mildly motion degraded. Brain: Within limitations of motion, no acute infarct, intracranial hemorrhage, mass, midline shift, or extra-axial fluid collection is identified. The ventricles and sulci are normal. Vascular: Calcified atherosclerosis at the skull base. No hyperdense vessel. Skull: No fracture or suspicious osseous lesion. Sinuses/Orbits: Partially visualized mild mucosal thickening in the left maxillary sinus. Bilateral cataract extraction. Other: None. ASPECTS [facility] Stroke Program Early CT Score) - Ganglionic level infarction (caudate, lentiform nuclei, internal capsule, insula, M1-M3 cortex): 7 - Supraganglionic infarction (M4-M6 cortex): 3 Total score (0-10 with 10 being normal): 10 IMPRESSION: 1. No evidence of acute intracranial abnormality within limitations of mild motion. 2. ASPECTS is 10.  These results were communicated to Dr. Rory Percy at 3:50 pm on 10/19/2019 by text page via the [facility] messaging system. Electronically Signed   By: Logan Bores M.D.   On: 10/19/2019 15:50     Antimicrobials:   NONE   Subjective: Pt reports she is stable, no reports of additional seizures, is receiving hi dose IV steroids  Objective: Vitals:   10/25/19 0349 10/25/19 0742 10/25/19 0826 10/25/19 1214  BP:  140/84  129/73  Pulse:  (!) 104 (!) 102 (!) 107  Resp: 15 13 17 16   Temp:  98.4 F (36.9 C)  98.7 F (37.1 C)  TempSrc:  Oral  Oral  SpO2:  99%  98%   No intake or output data in the 24 hours ending 10/25/19 1354 There were no vitals filed for this visit.  Examination:  General exam: Appears calm and comfortable ,Not in distress,ANSWERS QUESTIONS APPROPRIATELY HEENT:PERRL,Oral mucosa moist, Ear/Nose normal on gross exam Respiratory system: Bilateral equal air entry, normal vesicular breath sounds, no wheezes or crackles  Cardiovascular system: S1 & S2 heard, RRR. No JVD, murmurs, rubs, gallops or clicks. Gastrointestinal system: Abdomen is nondistended, soft and nontender. No organomegaly or masses felt. Normal bowel sounds heard. Central nervous system: Alert and oriented. No focal neurological deficits. Extremities: No edema, no clubbing ,no cyanosis Skin: No rashes, lesions or ulcers,no icterus ,no     Data Reviewed: I have personally reviewed following labs and imaging studies  CBC: Recent Labs  Lab 10/18/19 1501 10/18/19 1501 10/19/19 1539 10/19/19 1541 10/20/19 0456 10/21/19 0808 10/25/19 0354  WBC 9.2  --   --  8.9 8.7 7.7 8.6  NEUTROABS 7.5  --   --  6.8  --   --  7.1  HGB 12.5   < > 10.2* 10.2* 9.6* 9.6* 9.0*  HCT 35.6*   < > 30.0* 31.4* 29.3* 29.7* 27.0*  MCV 82.8  --   --  88.7 89.3 89.5 86.0  PLT  377  --   --  329 342 340 445*   < > = values in this interval not displayed.   Basic Metabolic Panel: Recent Labs  Lab 10/20/19 0456 10/20/19 1709  10/21/19 0808 10/22/19 0845 10/25/19 0354  NA 142 145 143 140 141  K 3.0* 3.5 3.0* 3.6 3.2*  CL 111 115* 111 110 106  CO2 23 20* 23 18* 25  GLUCOSE 111* 100* 187* 153* 63*  BUN <5* <5* <5* <5* 10  CREATININE 0.62 0.60 0.59 0.58 0.67  CALCIUM 8.2* 8.2* 8.1* 8.1* 8.7*   GFR: Estimated Creatinine Clearance: 63.9 mL/min (by C-G formula based on SCr of 0.67 mg/dL). Liver Function Tests: Recent Labs  Lab 10/18/19 1501 10/19/19 1541 10/20/19 0456  AST 30 31 26   ALT 14 15 15   ALKPHOS 71 59 62  BILITOT 0.8 0.7 0.7  PROT 7.8 6.6 5.9*  ALBUMIN 3.4* 2.6* 2.4*   No results for input(s): LIPASE, AMYLASE in the last 168 hours. No results for input(s): AMMONIA in the last 168 hours. Coagulation Profile: Recent Labs  Lab 10/19/19 1541  INR 1.1   Cardiac Enzymes: No results for input(s): CKTOTAL, CKMB, CKMBINDEX, TROPONINI in the last 168 hours. BNP (last 3 results) No results for input(s): PROBNP in the last 8760 hours. HbA1C: No results for input(s): HGBA1C in the last 72 hours. CBG: Recent Labs  Lab 10/24/19 1511 10/24/19 2123 10/25/19 0642 10/25/19 0745 10/25/19 1211  GLUCAP 190* 153* 84 102* 167*   Lipid Profile: No results for input(s): CHOL, HDL, LDLCALC, TRIG, CHOLHDL, LDLDIRECT in the last 72 hours. Thyroid Function Tests: No results for input(s): TSH, T4TOTAL, FREET4, T3FREE, THYROIDAB in the last 72 hours. Anemia Panel: No results for input(s): VITAMINB12, FOLATE, FERRITIN, TIBC, IRON, RETICCTPCT in the last 72 hours. Sepsis Labs: No results for input(s): PROCALCITON, LATICACIDVEN in the last 168 hours.  Recent Results (from the past 240 hour(s))  SARS Coronavirus 2 by RT PCR (hospital order, performed in [facility] hospital lab) Nasopharyngeal Nasopharyngeal Swab     Status: None   Collection Time: 10/19/19  9:30 PM   Specimen: Nasopharyngeal Swab  Result Value Ref Range Status   SARS Coronavirus 2 NEGATIVE NEGATIVE Final    Comment: (NOTE) SARS-CoV-2  target nucleic acids are NOT DETECTED.  The SARS-CoV-2 RNA is generally detectable in upper and lower respiratory specimens during the acute phase of infection. The lowest concentration of SARS-CoV-2 viral copies this assay can detect is 250 copies / mL. A negative result does not preclude SARS-CoV-2 infection and should not be used as the sole basis for treatment or other patient management decisions.  A negative result may occur with improper specimen collection / handling, submission of specimen other than nasopharyngeal swab, presence of viral mutation(s) within the areas targeted by this assay, and inadequate number of viral copies (<250 copies / mL). A negative result must be combined with clinical observations, patient history, and epidemiological information.  Fact Sheet for Patients:   StrictlyIdeas.no  Fact Sheet for Healthcare Providers: BankingDealers.co.za  This test is not yet approved or  cleared by the [place] FDA and has been authorized for detection and/or diagnosis of SARS-CoV-2 by FDA under an Emergency Use Authorization (EUA).  This EUA will remain in effect (meaning this test can be used) for the duration of the COVID-19 declaration under Section 564(b)(1) of the Act, 21 U.S.C. section 360bbb-3(b)(1), unless the authorization is terminated or revoked sooner.  Performed at [facility],  [address], [place], [place] [address]   Stool culture (children & immunocomp patients)     Status: None (Preliminary result)   Collection Time: 10/21/19  2:34 PM   Specimen: Stool  Result Value Ref Range Status   Salmonella/Shigella Screen Final report  Final   Campylobacter Culture PENDING  Incomplete   E coli, Shiga toxin Assay Negative Negative Final    Comment: (NOTE) Performed At: [facility] [address] [place], [place] [id number] Rush Farmer MD QQ:[id number]   OVA + PARASITE EXAM      Status: None   Collection Time: 10/21/19  2:34 PM   Specimen: Stool  Result Value Ref Range Status   OVA + PARASITE EXAM Final report  Final    Comment: (NOTE) These results were obtained using wet preparation(s) and trichrome stained smear. This test does not include testing for Cryptosporidium parvum, Cyclospora, or Microsporidia. Performed At: [place], [place] [id number] Caprice Red MD IP:[id number]    Source of Sample STOOL  Final    Comment: Performed at [facility], [place] [address] [place], [place] [address]  STOOL CULTURE REFLEX - [facility]     Status: None   Collection Time: 10/21/19  2:34 PM  Result Value Ref Range Status   Stool Culture result 1 ([facility]) Comment  Final    Comment: (NOTE) No Salmonella or Shigella recovered. Performed At: [facility] [facility] [place], [place] [id number] Rush Farmer MD [id number]   CSF culture with Stat gram stain     Status: None (Preliminary result)   Collection Time: 10/22/19  3:15 PM   Specimen: CSF; Cerebrospinal Fluid  Result Value Ref Range Status   Specimen Description CSF  Final   Special Requests NONE  Final   Gram Stain   Final    WBC PRESENT, PREDOMINANTLY PMN NO ORGANISMS SEEN CYTOSPIN SMEAR    Culture   Final    NO GROWTH 3 DAYS Performed at [facility], [address], [place], [place] [address]    Report Status PENDING  Incomplete         Radiology Studies: No results found.      Scheduled Meds: . aspirin EC  81 mg Oral Daily  . cloBAZam  5 mg Oral BID  . enoxaparin (LOVENOX) injection  40 mg Subcutaneous Q24H  . insulin aspart  0-6 Units Subcutaneous TID WC  . insulin aspart protamine- aspart  70 Units Subcutaneous BID WC  . levETIRAcetam  1,500 mg Oral BID  . levothyroxine  150 mcg Oral Q0600  . liothyronine  5 mcg Oral Daily  . sodium chloride flush  3 mL Intravenous Once   Continuous Infusions: . methylPREDNISolone  (SOLU-MEDROL) injection 1,000 mg (10/24/19 1809)     LOS: 5 days    Time spent: 35 min    Nicolette Bang, MD [facility]  If 7PM-7AM, please contact night-coverage  10/25/2019, 1:54 PM

## 2019-10-25 NOTE — Progress Notes (Signed)
Potassium 3.2. S. Thomas informed.

## 2019-10-25 NOTE — Progress Notes (Signed)
Per Mansy V.O. to give half amp D50. Mansy informed of pt CBG being 76 at this time after being treated for hypoglycemia.

## 2019-10-25 NOTE — Progress Notes (Signed)
CBG 69 asymptomatic, pt treated with juice, CBG up to 76. Mansy informed.

## 2019-10-26 LAB — GLUCOSE, CAPILLARY
Glucose-Capillary: 113 mg/dL — ABNORMAL HIGH (ref 70–99)
Glucose-Capillary: 122 mg/dL — ABNORMAL HIGH (ref 70–99)
Glucose-Capillary: 125 mg/dL — ABNORMAL HIGH (ref 70–99)
Glucose-Capillary: 131 mg/dL — ABNORMAL HIGH (ref 70–99)
Glucose-Capillary: 162 mg/dL — ABNORMAL HIGH (ref 70–99)
Glucose-Capillary: 164 mg/dL — ABNORMAL HIGH (ref 70–99)
Glucose-Capillary: 192 mg/dL — ABNORMAL HIGH (ref 70–99)
Glucose-Capillary: 69 mg/dL — ABNORMAL LOW (ref 70–99)
Glucose-Capillary: 73 mg/dL (ref 70–99)
Glucose-Capillary: 74 mg/dL (ref 70–99)
Glucose-Capillary: 74 mg/dL (ref 70–99)
Glucose-Capillary: 97 mg/dL (ref 70–99)

## 2019-10-26 MED ORDER — INSULIN ASPART PROT & ASPART (70-30 MIX) 100 UNIT/ML ~~LOC~~ SUSP
55.0000 [IU] | Freq: Two times a day (BID) | SUBCUTANEOUS | Status: DC
  Administered 2019-10-26: 55 [IU] via SUBCUTANEOUS
  Filled 2019-10-26: qty 10

## 2019-10-26 MED ORDER — DEXTROSE 50 % IV SOLN
25.0000 mL | Freq: Once | INTRAVENOUS | Status: AC
  Administered 2019-10-26: 25 mL via INTRAVENOUS
  Filled 2019-10-26: qty 50

## 2019-10-26 NOTE — Progress Notes (Signed)
PROGRESS NOTE    Andrea [name]  [name] DOB: Jul 24, 1960 DOA: 10/19/2019 PCP: [name], MD   Brief Narrative:  Patient is a 59 year old female with history of insulin-dependent diabetes mellitus, hyperlipidemia, hypothyroidism who was brought to the emergency department for evaluation of hypoglycemia. She presented to the emergency department on 6/19 for similar problem and was discharged to home after correction ofhypoglycemia. When EMS arrived at home, patient had 2 focal seizures and was given IV Versed and glucose. On presentation she had mild left-sided weakness and dysarthria. She takes 70/30 insulin at home. On presentation, CT head did not show any acute abnormality but MRI findings are consistent with recent seizure activity. Neurology was consulted. Hospital course remarkable for severe constipation.she developed twitching of face muscles on 09/2319. She underwent LP by neurology suspecting for autoimmune/inflammatory disorder. Started on high-dose steroids. PT recommending SNF on DC   Assessment & Plan:   Active Problems:   Type 1 diabetes mellitus with other specified complication (HCC)   Seizure ([place])   Hypoglycemia   Hypokalemia   Acute encephalopathy: Most likely secondary to hypoglycemia versus seizure. Currently alert and oriented. Continue to monitor mental status. Adjust insulin down tonight redducing 70/30 55 units down from 70, monitor an additional day 2/2 hypoglycemia and rapid bounceback from ER  Seizures: New onset. Neurology was consulted and following. Started on Keppra and clobazam. EEG showed epileptiform discharges. No abnormal enhancement seen on MRI.  She again had twitching of the facial muscles on 10/22/19. Neurology did LP, started on Solu-Medrol for possible autoimmune/inflammatory disorder/paraneoplastic disorder.we will follow-up CSF fluid panel. Most of the results are nonassuring but some of those sent to outside labs are  pending.Neurology planning to continue Solu-Medrol 1 g a day for total of 5 doses, day 5/5 6/27-will complete this ever around 1900  Hypoglycemia/type 1 diabetes: Reported of receiving 80 units of 70/30 insulin on 6/19 morning. She was given IV fluids with D5. Hemoglobin A1c of 12.4. She is on 80 units of 70/30 insulin at home. We expect that her blood sugars will be uncontrolled because she has been started on steroids. We increased the dose to 70 units.We will continue to adjust the insulin if needed. Diabetic coordinatorfollowing.  Hypokalemia:Being monitored and supplemented  Hypothyroidism: Continue Synthyroid  History of weight loss: CT abdomen/pelvis without finding of any malignancy. CT chest showed peribronchial irregular opacities consistent with inflammatory or infectious etiology.Showed a63mm left upper lobe pulmonary nodule. She was a past smoker,we recommend follow up chest CT in 3-58months .  Severe constipation:CT abdomen/pelvis done here showedsevere fecal impaction, focal colitis secondary to stool balls, bilateral hydroureteronephrosis secondary to fecal impaction. Patient having bowel movement now.Continue miralax and senokot daily.Denied any abdominal pain.  Debility/deconditioning: Patient seen by physical therapy and recommended skilled nursing facility on discharge.  DVT prophylaxis: Lovenox SQ  Code Status: full    Code Status Orders  (From admission, onward)         Start     Ordered   10/19/19 2054  Full code  Continuous        10/19/19 2053        Code Status History    Date Active Date Inactive Code Status Order ID Comments User Context   06/14/2017 1424 06/16/2017 1429 Full Code [id number]  Olean Ree, MD Inpatient   Advance Care Planning Activity     Family Communication: niece at bedside  Disposition Plan:    Status is: Inpatient  Remains inpatient appropriate because:IV treatments appropriate  due to intensity of  illness or inability to take PO   Dispo: The patient is from: Home  Anticipated d/c is to: TBD  Anticipated d/c date is: 2 days  Patient currently is not medically stable to d/c.    Consults called: NEURO Admission status: Inpatient  Procedures:  CT CHEST W CONTRAST  Result Date: 10/23/2019 CLINICAL DATA:  Seizure, abnormal CT abdomen with basilar lung changes EXAM: CT CHEST WITH CONTRAST TECHNIQUE: Multidetector CT imaging of the chest was performed during intravenous contrast administration. Sagittal and coronal MPR images reconstructed from axial data set. CONTRAST:  40mL OMNIPAQUE IOHEXOL 300 MG/ML  SOLN IV COMPARISON:  CT abdomen and pelvis 10/21/2019 FINDINGS: Cardiovascular: Atherosclerotic calcification aorta, proximal great vessels and coronary arteries. Aorta normal caliber. Heart normal size. No pericardial effusion. Mediastinum/Nodes: Esophagus unremarkable. Base of cervical region normal appearance. No thoracic adenopathy. Lungs/Pleura: Linear areas of atelectasis versus scarring scattered in both lungs. Patchy areas of peribronchovascular density are seen in both lungs, some associated with minimal bronchiectasis, favor post inflammatory. Central peribronchial thickening present. No segmental consolidation, pleural effusion or pneumothorax. 3 mm LEFT upper lobe nodule image 30. No additional pulmonary mass/nodule. Upper Abdomen: Stomach distended by food debris. Remaining upper abdomen unremarkable Musculoskeletal: No osseous abnormalities. Scattered chest wall and axillary soft tissue edema, nonspecific. IMPRESSION: Scattered areas of atelectasis versus scarring in both lungs with few scattered areas of peribronchovascular opacity bilaterally, a few of which are associated with minimal bronchiectasis, favor postinflammatory scarring. Single 3 mm nonspecific LEFT upper lobe nodule. Short-term follow-up CT recommended in 3 months to reassess the  areas of suspected scarring to ensure stability. Aortic Atherosclerosis (ICD10-I70.0). Electronically Signed   By: Lavonia Dana M.D.   On: 10/23/2019 13:00   MR BRAIN WO CONTRAST  Result Date: 10/19/2019 CLINICAL DATA:  Stroke follow-up. Left-sided weakness. Hypoglycemia. Witnessed seizures by EMS. Clinically suspected Todd's paralysis. EXAM: MRI HEAD WITHOUT CONTRAST TECHNIQUE: Multiplanar, multiecho pulse sequences of the brain and surrounding structures were obtained without intravenous contrast. COMPARISON:  Head CT 10/19/2019 FINDINGS: The study is mildly motion degraded. Brain: There is mild restricted diffusion involving cortex in the medial right frontal lobe, right insula, and likely right hippocampus which does not conform to a vascular territory and is without associated edema. No intracranial hemorrhage, mass, midline shift, or extra-axial fluid collection is identified. No significant white matter disease is seen for age. The ventricles and sulci are normal. Vascular: Major intracranial vascular flow voids are preserved. Skull and upper cervical spine: Unremarkable bone marrow signal para Sinuses/Orbits: Bilateral cataract extraction. Small mucous retention cysts in the maxillary sinuses. Clear mastoid air cells. Other: None. IMPRESSION: Mild restricted diffusion in the medial right frontal lobe, right insula, and likely right hippocampus most consistent with recent seizure activity. Electronically Signed   By: Logan Bores M.D.   On: 10/19/2019 16:48   MR BRAIN W CONTRAST  Result Date: 10/20/2019 CLINICAL DATA:  Seizure.  Abnormal MRI. EXAM: MRI HEAD WITH CONTRAST TECHNIQUE: Multiplanar, multiecho pulse sequences of the brain and surrounding structures were obtained with intravenous contrast. CONTRAST:  70mL GADAVIST GADOBUTROL 1 MMOL/ML IV SOLN COMPARISON:  MRI head without contrast 05/21/2019 FINDINGS: Brain: Review of the MRI from yesterday reveals restricted diffusion in the right medial  frontal cortex, also in the right insula and right hippocampus. Note that the lesions are best seen on diffusion-weighted imaging with no significant associated FLAIR cortical edema. These findings are unilateral. No abnormal enhancement on today's study. Ventricle size is normal.  Diffusion-weighted imaging was not repeated today IMPRESSION: Normal enhancement of the brain. Based on the findings yesterday, differential includes seizure related cortical activity, autoimmune related encephalitis, and less likely viral encephalitis. Consider lumbar puncture. Short-term follow-up MRI with contrast may be helpful. Electronically Signed   By: Franchot Gallo M.D.   On: 10/20/2019 16:40   CT ABDOMEN PELVIS W CONTRAST  Result Date: 10/22/2019 CLINICAL DATA:  Unintended weight loss. EXAM: CT ABDOMEN AND PELVIS WITH CONTRAST TECHNIQUE: Multidetector CT imaging of the abdomen and pelvis was performed using the standard protocol following bolus administration of intravenous contrast. CONTRAST:  158mL OMNIPAQUE IOHEXOL 300 MG/ML  SOLN COMPARISON:  None. FINDINGS: Lower chest: Majority of the chest is included in the field of view. Scattered linear and bandlike opacities throughout both lungs are typical of scarring. There is a subpleural 5 mm left upper lobe pulmonary nodule, series 6, image 26. Scattered peribronchovascular irregular opacities in the right lower lobe series 6, image 53, right middle lobe, series 6, image 39, left lower lobe, same image, left upper lobe series 6, image 29, and right mid lung same image. No dominant pulmonary mass. Hepatobiliary: Scattered subcentimeter hypodensities in the liver are too small to characterize but likely small cysts or biliary hamartomas. No evidence of suspicious liver lesion. Gallbladder physiologically distended, no calcified stone. No biliary dilatation. Pancreas: No ductal dilatation or inflammation. Spleen: Normal in size without focal abnormality. Adrenals/Urinary Tract:  Normal adrenal glands. There is right hydroureteronephrosis. The ureter is dilated to pelvic brim. There is mild left hydronephrosis and proximal hydroureter, with similar ureteral dilatation. Diminished excretion on delayed phase imaging, right greater than left. No evidence of focal renal mass. Urinary bladder is displaced anteriorly by large stool ball in the rectum. Stomach/Bowel: Large inspissated stool ball distends the rectum with rectal distention of 8.6 cm. Associated rectal wall thickening and perirectal edema. No pneumatosis. Approximately 6.3 cm length area of sigmoid colonic wall thickening with pericolonic edema just proximal to the large volume of rectosigmoid stool. Large volume of stool throughout the remainder of the colon, there is significant sigmoid colonic redundancy. No additional areas of colonic wall thickening. The appendix is not confidently visualized. The stomach is unremarkable. No small bowel obstruction or abnormal distention. No definite small bowel inflammation. Vascular/Lymphatic: Moderate aorto bi-iliac atherosclerosis, advanced for age. No aortic aneurysm. Patent portal vein. There is no adenopathy in the abdomen or pelvis. Reproductive: Calcified uterine fibroids. The uterus is displaced into the right pelvis and anteriorly by large rectosigmoid stool burden. No evidence of adnexal mass. Other: Perirectal and distal pericolonic edema with small amount of presacral fluid. No other ascites. There is no free air or perforation. Mild generalized body wall edema. Musculoskeletal: Mild L1 superior endplate compression fracture, age indeterminate. No evidence of focal bone lesion. IMPRESSION: 1. Large inspissated stool ball distends the rectum with rectal distention of 8.6 cm. Associated rectal wall thickening and perirectal edema, suspicious for fecal impaction. The stool distended rectum causes mass effect on the urinary bladder, uterus, and distal ureters with bilateral  hydroureteronephrosis, right greater than left. 2. Approximately 6.3 cm length area of sigmoid colonic wall thickening with pericolonic edema just proximal to the large volume of rectosigmoid stool. This may represent focal colitis, including stercoral colitis versus colonic neoplasm. 3. Large volume of stool throughout the remainder of the colon. There is significant sigmoid colonic redundancy. 4. Scattered peribronchovascular irregular opacities in the lung bases, likely infectious or inflammatory. There is a 5 mm subpleural left upper  lobe pulmonary nodule. No follow-up needed if patient is low-risk (and has no known or suspected primary neoplasm). Non-contrast chest CT can be considered in 12 months if patient is high-risk. This recommendation follows the consensus statement: Guidelines for Management of Incidental Pulmonary Nodules Detected on CT Images: From the Fleischner Society 2017; Radiology 2017; 284:228-243. 5. Mild L1 superior endplate compression fracture, age indeterminate. Aortic Atherosclerosis (ICD10-I70.0). Electronically Signed   By: Keith Rake M.D.   On: 10/22/2019 00:00   DG CHEST PORT 1 VIEW  Result Date: 10/19/2019 CLINICAL DATA:  59 year old female with left-sided weakness. Concern for stroke. EXAM: PORTABLE CHEST 1 VIEW COMPARISON:  Chest radiograph dated 01/14/2005. FINDINGS: No focal consolidation, pleural effusion or pneumothorax. The cardiac silhouette is within limits. No acute osseous pathology. IMPRESSION: No active disease. Electronically Signed   By: Anner Crete M.D.   On: 10/19/2019 19:28   EEG adult  Result Date: 10/19/2019 Lora Havens, MD     10/19/2019  8:23 PM Patient Name: Ariday Brinker MRN: [id number] Epilepsy Attending: Lora Havens Referring Physician/Provider: Dr Amie [place] Date: 10/19/2019 Duration: 30.57 mins Patient history: 59yo F who presented with ams and seizure. EEG to evaluate for seizure, Level of alertness: Awake AEDs during EEG  study:  keppra Technical aspects: This EEG study was done with scalp electrodes positioned according to the 10-20 International system of electrode placement. Electrical activity was acquired at a sampling rate of 500Hz  and reviewed with a high frequency filter of 70Hz  and a low frequency filter of 1Hz . EEG data were recorded continuously and digitally stored. Description: No posterior dominant rhythm was seen. EEG showed generalize, maximal bifrontal periodic epileptiform discharges with triphasic morphology at 0.25-0.5Hz . EEG also showed continuous generalized 8-9Hz  alpha activity.  Hyperventilation and photic stimulation were not performed.   ABNORMALITY -Periodic epileptiform discharges with triphasic morphology, generalized, maximal bifrontal IMPRESSION: This study showed evidence of generalized epileptogenicity with epileptiform discharges at 0.25-0.5hz  which is on the ictal-interictal continuum. No definite seizures were seen throughout the recording. Dr Cheral Marker was notified. Priyanka Barbra Sarks   Overnight EEG with video  Result Date: 10/20/2019 Lora Havens, MD     10/21/2019  1:03 PM Patient Name: Arlenis Blaydes MRN: [id number] Epilepsy Attending: Lora Havens Referring Physician/Provider: Dr Milus Banister Duration: 10/19/2019 2027 to 10/20/2019 2027  Patient history: 59yo F who presented with ams and seizure. EEG to evaluate for seizure,  Level of alertness: Awake  AEDs during EEG study:  keppra  Technical aspects: This EEG study was done with scalp electrodes positioned according to the 10-20 International system of electrode placement. Electrical activity was acquired at a sampling rate of 500Hz  and reviewed with a high frequency filter of 70Hz  and a low frequency filter of 1Hz . EEG data were recorded continuously and digitally stored.  Description: No posterior dominant rhythm was seen. EEG initially showed generalized, maximal bifrontal periodic epileptiform discharges with triphasic  morphology at 0.25-0.5Hz . Gradually, eeg showed right frontocentral periodic epileptiform discharges at 1Hz . EEG also showed continuous generalized 8-9Hz  alpha activity. Event button was pressed multiple times during which patient was noted to have left side face twitching, lasting about 30 seconds to one minute. Concomitant eeg showed rhythmic sharply contoured 5-9h generalized theta-alpha activity which evolved into 3-5Hz  theta-delta activity.  ABNORMALITY - Seizure, generalized - Periodic epileptiform discharges with triphasic morphology, generalized, maximal bifrontal - Continuous slow, generalized  IMPRESSION: This study showed multiple seizures with generalized onset during which patient was noted to have left face  twitching, lasting 30 seconds to one minute. Additionally, there are epileptiform discharges at 0.25-0.5hz  which is on the ictal-interictal continuum as well as moderate diffuse encephalopathy, non specific to etiology.  Lora Havens   CT HEAD CODE STROKE WO CONTRAST  Result Date: 10/19/2019 CLINICAL DATA:  Code stroke.  Left-sided weakness. EXAM: CT HEAD WITHOUT CONTRAST TECHNIQUE: Contiguous axial images were obtained from the base of the skull through the vertex without intravenous contrast. COMPARISON:  None. FINDINGS: The study is mildly motion degraded. Brain: Within limitations of motion, no acute infarct, intracranial hemorrhage, mass, midline shift, or extra-axial fluid collection is identified. The ventricles and sulci are normal. Vascular: Calcified atherosclerosis at the skull base. No hyperdense vessel. Skull: No fracture or suspicious osseous lesion. Sinuses/Orbits: Partially visualized mild mucosal thickening in the left maxillary sinus. Bilateral cataract extraction. Other: None. ASPECTS [facility] Stroke Program Early CT Score) - Ganglionic level infarction (caudate, lentiform nuclei, internal capsule, insula, M1-M3 cortex): 7 - Supraganglionic infarction (M4-M6 cortex):  3 Total score (0-10 with 10 being normal): 10 IMPRESSION: 1. No evidence of acute intracranial abnormality within limitations of mild motion. 2. ASPECTS is 10. These results were communicated to Dr. Rory Percy at 3:50 pm on 10/19/2019 by text page via the [facility] messaging system. Electronically Signed   By: Logan Bores M.D.   On: 10/19/2019 15:50     Antimicrobials:   none    Subjective: Somewhat more lethargic this am, blood glucose low at 63, held dose, gave juice, lowered eve insulin  Objective: Vitals:   10/26/19 0309 10/26/19 0310 10/26/19 0831 10/26/19 1131  BP:  (!) 152/92 (!) 148/95 136/81  Pulse:  (!) 110 (!) 104 (!) 105  Resp:  16 18 18   Temp: 98.7 F (37.1 C)  98.3 F (36.8 C) 98.3 F (36.8 C)  TempSrc: Oral  Oral Oral  SpO2:  100% 100% 99%    Intake/Output Summary (Last 24 hours) at 10/26/2019 1429 Last data filed at 10/26/2019 1100 Gross per 24 hour  Intake 150 ml  Output --  Net 150 ml   There were no vitals filed for this visit.  Examination:  General exam:Appears calm and comfortable ,Not in distress,ANSWERS QUESTIONS APPROPRIATELY HEENT:PERRL,Oral mucosa moist, Ear/Nose normal on gross exam Respiratory system: Bilateral equal air entry, normal vesicular breath sounds, no wheezes or crackles  Cardiovascular system:S1 &S2 heard, RRR. No JVD, murmurs, rubs, gallops or clicks. Gastrointestinal system:Abdomen is nondistended, soft and nontender. No organomegaly or masses felt. Normal bowel sounds heard. Central nervous system:Alert and oriented, though mod slowed this AM. No focal neurological deficits. Extremities: No edema, no clubbing ,no cyanosis Skin: No new rashes, lesions or ulcers,no icterus  Psychiatry: . Mood & affect flat    Data Reviewed: I have personally reviewed following labs and imaging studies  CBC: Recent Labs  Lab 10/19/19 1539 10/19/19 1541 10/20/19 0456 10/21/19 0808 10/25/19 0354  WBC  --  8.9 8.7 7.7 8.6  NEUTROABS  --  6.8   --   --  7.1  HGB 10.2* 10.2* 9.6* 9.6* 9.0*  HCT 30.0* 31.4* 29.3* 29.7* 27.0*  MCV  --  88.7 89.3 89.5 86.0  PLT  --  329 342 340 315*   Basic Metabolic Panel: Recent Labs  Lab 10/20/19 0456 10/20/19 1709 10/21/19 0808 10/22/19 0845 10/25/19 0354  NA 142 145 143 140 141  K 3.0* 3.5 3.0* 3.6 3.2*  CL 111 115* 111 110 106  CO2 23 20* 23 18* 25  GLUCOSE 111*  100* 187* 153* 63*  BUN <5* <5* <5* <5* 10  CREATININE 0.62 0.60 0.59 0.58 0.67  CALCIUM 8.2* 8.2* 8.1* 8.1* 8.7*   GFR: Estimated Creatinine Clearance: 63.9 mL/min (by C-G formula based on SCr of 0.67 mg/dL). Liver Function Tests: Recent Labs  Lab 10/19/19 1541 10/20/19 0456  AST 31 26  ALT 15 15  ALKPHOS 59 62  BILITOT 0.7 0.7  PROT 6.6 5.9*  ALBUMIN 2.6* 2.4*   No results for input(s): LIPASE, AMYLASE in the last 168 hours. No results for input(s): AMMONIA in the last 168 hours. Coagulation Profile: Recent Labs  Lab 10/19/19 1541  INR 1.1   Cardiac Enzymes: No results for input(s): CKTOTAL, CKMB, CKMBINDEX, TROPONINI in the last 168 hours. BNP (last 3 results) No results for input(s): PROBNP in the last 8760 hours. HbA1C: No results for input(s): HGBA1C in the last 72 hours. CBG: Recent Labs  Lab 10/26/19 0826 10/26/19 0841 10/26/19 1002 10/26/19 1202 10/26/19 1407  GLUCAP 69* 74 162* 192* 164*   Lipid Profile: No results for input(s): CHOL, HDL, LDLCALC, TRIG, CHOLHDL, LDLDIRECT in the last 72 hours. Thyroid Function Tests: No results for input(s): TSH, T4TOTAL, FREET4, T3FREE, THYROIDAB in the last 72 hours. Anemia Panel: No results for input(s): VITAMINB12, FOLATE, FERRITIN, TIBC, IRON, RETICCTPCT in the last 72 hours. Sepsis Labs: No results for input(s): PROCALCITON, LATICACIDVEN in the last 168 hours.  Recent Results (from the past 240 hour(s))  SARS Coronavirus 2 by RT PCR (hospital order, performed in [facility] hospital lab) Nasopharyngeal Nasopharyngeal Swab     Status: None    Collection Time: 10/19/19  9:30 PM   Specimen: Nasopharyngeal Swab  Result Value Ref Range Status   SARS Coronavirus 2 NEGATIVE NEGATIVE Final    Comment: (NOTE) SARS-CoV-2 target nucleic acids are NOT DETECTED.  The SARS-CoV-2 RNA is generally detectable in upper and lower respiratory specimens during the acute phase of infection. The lowest concentration of SARS-CoV-2 viral copies this assay can detect is 250 copies / mL. A negative result does not preclude SARS-CoV-2 infection and should not be used as the sole basis for treatment or other patient management decisions.  A negative result may occur with improper specimen collection / handling, submission of specimen other than nasopharyngeal swab, presence of viral mutation(s) within the areas targeted by this assay, and inadequate number of viral copies (<250 copies / mL). A negative result must be combined with clinical observations, patient history, and epidemiological information.  Fact Sheet for Patients:   StrictlyIdeas.no  Fact Sheet for Healthcare Providers: BankingDealers.co.za  This test is not yet approved or  cleared by the [place] FDA and has been authorized for detection and/or diagnosis of SARS-CoV-2 by FDA under an Emergency Use Authorization (EUA).  This EUA will remain in effect (meaning this test can be used) for the duration of the COVID-19 declaration under Section 564(b)(1) of the Act, 21 U.S.C. section 360bbb-3(b)(1), unless the authorization is terminated or revoked sooner.  Performed at [facility], [place] [address], [place], [place] [address]   Stool culture (children & immunocomp patients)     Status: None   Collection Time: 10/21/19  2:34 PM   Specimen: Stool  Result Value Ref Range Status   Salmonella/Shigella Screen Final report  Final   Campylobacter Culture Final report  Final   E coli, Shiga toxin Assay Negative Negative Final     Comment: (NOTE) Performed At: [facility] [place], [place]  [id number] Rush Farmer MD [id number]   OVA + PARASITE EXAM     Status: None   Collection Time: 10/21/19  2:34 PM   Specimen: Stool  Result Value Ref Range Status   OVA + PARASITE EXAM Final report  Final    Comment: (NOTE) These results were obtained using wet preparation(s) and trichrome stained smear. This test does not include testing for Cryptosporidium parvum, Cyclospora, or Microsporidia. Performed At: [place], [place] [id number] Caprice Red MD [id number]    Source of Sample STOOL  Final    Comment: Performed at [facility], [place] [address], [place], [place] [address]  STOOL CULTURE REFLEX - RSASHR     Status: None   Collection Time: 10/21/19  2:34 PM  Result Value Ref Range Status   Stool Culture result 1 (RSASHR) Comment  Final    Comment: (NOTE) No Salmonella or Shigella recovered. Performed At: [facility] [address] [place], [place] [id number] Rush Farmer MD OA:[id number]   STOOL CULTURE Reflex - CMPCXR     Status: None   Collection Time: 10/21/19  2:34 PM  Result Value Ref Range Status   Stool Culture result 1 (CMPCXR) Comment  Final    Comment: (NOTE) No Campylobacter species isolated. Performed At: [facility] [place], [place] [id number] Rush Farmer MD DD:[id number]   CSF culture with Stat gram stain     Status: None   Collection Time: 10/22/19  3:15 PM   Specimen: CSF; Cerebrospinal Fluid  Result Value Ref Range Status   Specimen Description CSF  Final   Special Requests NONE  Final   Gram Stain   Final    WBC PRESENT, PREDOMINANTLY PMN NO ORGANISMS SEEN CYTOSPIN SMEAR    Culture   Final    NO GROWTH 3 DAYS Performed at [facility], [address], [place], [place] [address]    Report Status 10/25/2019 FINAL  Final         Radiology Studies: No results  found.      Scheduled Meds: . aspirin EC  81 mg Oral Daily  . cloBAZam  5 mg Oral BID  . enoxaparin (LOVENOX) injection  40 mg Subcutaneous Q24H  . insulin aspart  0-6 Units Subcutaneous TID WC  . insulin aspart protamine- aspart  70 Units Subcutaneous BID WC  . levETIRAcetam  1,500 mg Oral BID  . levothyroxine  150 mcg Oral Q0600  . liothyronine  5 mcg Oral Daily  . sodium chloride flush  3 mL Intravenous Once   Continuous Infusions: . methylPREDNISolone (SOLU-MEDROL) injection 1,000 mg (10/25/19 1728)     LOS: 6 days    Time spent: [place], MD [facility]  If 7PM-7AM, please contact night-coverage  10/26/2019, 2:29 PM

## 2019-10-26 NOTE — Progress Notes (Signed)
Pt's CBG 73 down from 131, Dr. Hal Hope updated. V.O. to give 1/2 amp D50, give pt something to eat and recheck CBG w/in 30 mins.

## 2019-10-26 NOTE — Progress Notes (Signed)
Hypoglycemic Event  CBG: 69  Treatment: 8oz Orange Juice  Symptoms: pt asymptomatic  Follow-up CBG: [id number] CBG Result:74  Comments/MD notified: MD Spongberg notified. Verbal order to hold 8am dose of 70 units 70/30 novolog mix. Will continue to monitor CBG.     Andrea [name] T Andrea [name]

## 2019-10-27 DIAGNOSIS — L819 Disorder of pigmentation, unspecified: Secondary | ICD-10-CM

## 2019-10-27 DIAGNOSIS — E2839 Other primary ovarian failure: Secondary | ICD-10-CM

## 2019-10-27 LAB — CBC WITH DIFFERENTIAL/PLATELET
Abs Immature Granulocytes: 0.38 10*3/uL — ABNORMAL HIGH (ref 0.00–0.07)
Basophils Absolute: 0 10*3/uL (ref 0.0–0.1)
Basophils Relative: 0 %
Eosinophils Absolute: 0 10*3/uL (ref 0.0–0.5)
Eosinophils Relative: 0 %
HCT: 29.4 % — ABNORMAL LOW (ref 36.0–46.0)
Hemoglobin: 9.6 g/dL — ABNORMAL LOW (ref 12.0–15.0)
Immature Granulocytes: 5 %
Lymphocytes Relative: 14 %
Lymphs Abs: 1.2 10*3/uL (ref 0.7–4.0)
MCH: 28.4 pg (ref 26.0–34.0)
MCHC: 32.7 g/dL (ref 30.0–36.0)
MCV: 87 fL (ref 80.0–100.0)
Monocytes Absolute: 0.4 10*3/uL (ref 0.1–1.0)
Monocytes Relative: 5 %
Neutro Abs: 6.5 10*3/uL (ref 1.7–7.7)
Neutrophils Relative %: 76 %
Platelets: 491 10*3/uL — ABNORMAL HIGH (ref 150–400)
RBC: 3.38 MIL/uL — ABNORMAL LOW (ref 3.87–5.11)
RDW: 13.6 % (ref 11.5–15.5)
WBC: 8.5 10*3/uL (ref 4.0–10.5)
nRBC: 1.3 % — ABNORMAL HIGH (ref 0.0–0.2)

## 2019-10-27 LAB — BASIC METABOLIC PANEL
Anion gap: 14 (ref 5–15)
BUN: 12 mg/dL (ref 6–20)
CO2: 22 mmol/L (ref 22–32)
Calcium: 8.7 mg/dL — ABNORMAL LOW (ref 8.9–10.3)
Chloride: 105 mmol/L (ref 98–111)
Creatinine, Ser: 0.79 mg/dL (ref 0.44–1.00)
GFR calc Af Amer: >60 mL/min (ref >60)
GFR calc non Af Amer: >60 mL/min (ref >60)
Glucose, Bld: 153 mg/dL — ABNORMAL HIGH (ref 70–99)
Potassium: 3.5 mmol/L (ref 3.5–5.1)
Sodium: 141 mmol/L (ref 135–145)

## 2019-10-27 LAB — GLUCOSE, CAPILLARY
Glucose-Capillary: >600 mg/dL (ref 70–99)
Glucose-Capillary: 86 mg/dL (ref 70–99)
Glucose-Capillary: 131 mg/dL — ABNORMAL HIGH (ref 70–99)
Glucose-Capillary: 132 mg/dL — ABNORMAL HIGH (ref 70–99)
Glucose-Capillary: 134 mg/dL — ABNORMAL HIGH (ref 70–99)
Glucose-Capillary: 117 mg/dL — ABNORMAL HIGH (ref 70–99)
Glucose-Capillary: 285 mg/dL — ABNORMAL HIGH (ref 70–99)
Glucose-Capillary: 272 mg/dL — ABNORMAL HIGH (ref 70–99)
Glucose-Capillary: 160 mg/dL — ABNORMAL HIGH (ref 70–99)
Glucose-Capillary: 126 mg/dL — ABNORMAL HIGH (ref 70–99)

## 2019-10-27 MED ORDER — PEG 3350-KCL-NA BICARB-NACL 420 G PO SOLR
4000.0000 mL | Freq: Once | ORAL | Status: AC
  Administered 2019-10-28: 4000 mL via ORAL
  Filled 2019-10-27: qty 4000

## 2019-10-27 MED ORDER — LEVETIRACETAM 750 MG PO TABS: 1500.0000 mg | ORAL_TABLET | Freq: Two times a day (BID) | ORAL | 3 refills | Status: DC

## 2019-10-27 MED ORDER — SENNOSIDES-DOCUSATE SODIUM 8.6-50 MG PO TABS
1.0000 | ORAL_TABLET | Freq: Two times a day (BID) | ORAL | Status: DC
  Administered 2019-10-27 – 2019-10-31 (×9): 1 via ORAL
  Filled 2019-10-27 (×10): qty 1

## 2019-10-27 MED ORDER — INSULIN ASPART PROT & ASPART (70-30 MIX) 100 UNIT/ML ~~LOC~~ SUSP
25.0000 [IU] | Freq: Two times a day (BID) | SUBCUTANEOUS | Status: DC
  Administered 2019-10-27 (×2): 25 [IU] via SUBCUTANEOUS

## 2019-10-27 MED ORDER — INSULIN NPH ISOPHANE & REGULAR (70-30) 100 UNIT/ML ~~LOC~~ SUSP: 55.0000 [IU] | Freq: Two times a day (BID) | SUBCUTANEOUS | 11 refills | Status: DC

## 2019-10-27 MED ORDER — ROSUVASTATIN CALCIUM 20 MG PO TABS
20.0000 mg | ORAL_TABLET | Freq: Every day | ORAL | Status: DC
  Administered 2019-10-27 – 2019-11-11 (×16): 20 mg via ORAL
  Filled 2019-10-27 (×16): qty 1

## 2019-10-27 MED ORDER — SENNOSIDES-DOCUSATE SODIUM 8.6-50 MG PO TABS
2.0000 | ORAL_TABLET | Freq: Every day | ORAL | 1 refills | Status: DC
Start: 2019-10-27 — End: 2020-05-10

## 2019-10-27 MED ORDER — POLYETHYLENE GLYCOL 3350 17 G PO PACK: 17.0000 g | PACK | Freq: Every day | ORAL | 0 refills | Status: DC | PRN

## 2019-10-27 MED ORDER — POLYETHYLENE GLYCOL 3350 17 G PO PACK
17.0000 g | PACK | Freq: Every day | ORAL | Status: DC
  Administered 2019-10-28 – 2019-10-30 (×3): 17 g via ORAL
  Filled 2019-10-27 (×3): qty 1

## 2019-10-27 MED ORDER — LISINOPRIL 20 MG PO TABS
20.0000 mg | ORAL_TABLET | Freq: Every day | ORAL | Status: DC
  Administered 2019-10-27 – 2019-10-29 (×3): 20 mg via ORAL
  Filled 2019-10-27 (×4): qty 1

## 2019-10-27 MED ORDER — INSULIN ASPART PROT & ASPART (70-30 MIX) 100 UNIT/ML ~~LOC~~ SUSP: 45.0000 [IU] | Freq: Two times a day (BID) | SUBCUTANEOUS | Status: DC

## 2019-10-27 MED ORDER — CLOBAZAM 10 MG PO TABS: 5.0000 mg | ORAL_TABLET | Freq: Two times a day (BID) | ORAL | 0 refills | Status: DC

## 2019-10-27 NOTE — TOC Progression Note (Signed)
Transition of Care [facility]) - Progression Note    Patient Details  Name: Andrea [name] MRN: [id number] Date of Birth: Dec 03, 1960  Transition of Care [facility]) CM/SW Contact  Pollie Friar, RN Phone Number: 10/27/2019, 12:23 PM  Clinical Narrative:    CM met with the patient and she prefers to d/c home at when medically ready. CM informed her that with her medicaid she will get very little to no [facility] services. She states she will have transportation to outpatient therapy. CM will see if MD in agreement.  Pt states she has a walker at home but not a 3 in 1. CM will have this ordered and delivered to the room. Pt states her grandchildren can provide supervision for her at home.  TOC following.   Expected Discharge Plan: [place] Barriers to Discharge: Continued Medical Work up  Expected Discharge Plan and Services Expected Discharge Plan: [place] Choice: [place] arrangements for the past 2 months: Single Family Home Expected Discharge Date: 10/27/19                                     Social Determinants of Health (SDOH) Interventions    Readmission Risk Interventions No flowsheet data found.

## 2019-10-27 NOTE — Progress Notes (Signed)
  Speech Language Pathology Treatment: Dysphagia  Patient Details Name: Andrea [name] MRN: [id number] DOB: 29-Oct-1960 Today's Date: 10/27/2019 Time: 1010-1030 SLP Time Calculation (min) (ACUTE ONLY): 20 min  Assessment / Plan / Recommendation Clinical Impression  Skilled observation/treatment with pt exhibiting improved overall mentation; independently feeding self and consumed regular/thin liquids via straw/Dysphagia 3 (chopped) consistency without difficulty noted with left sulci residue and/or decreased left sensation; pt with good awareness of prior deficits; upgrade to regular/thin (carb modified/heart healthy) with ST s/o for goals met re: dysphagia management/tx.  HPI HPI: 59 y.o. female with past medical history significant for diabetes insulin-dependent, hyperlipidemia, hypothyroidism, who was just evaluated at [facility] ER on 6/19 for hypoglycemia, patient was initially nonverbal with generalized weakness at that time.  Hypoglycemia was corrected and she was discharged home.  6/21 family called EMS because patient's blood sugar was again low at 30.  Patient was also noted to have  left-sided weakness. MRI revealed "Mild restricted diffusion in the medial right frontal lobe, right insula, and likely right hippocampus. Differential includes seizure related cortical activity, autoimmune related encephalitis, and less likely viral encephalitis. most consistent with recent seizure activity." Pt passed Yale swallow screen, but was found subsequently to be pocketing food; SLP swallow evaluation was ordered.       SLP Plan  All goals met       Recommendations  Diet recommendations: Regular;Thin liquid Liquids provided via: Straw;Cup Medication Administration: Whole meds with puree Supervision: Patient able to self feed;Intermittent supervision to cue for compensatory strategies Compensations: Slow rate;Small sips/bites Postural Changes and/or Swallow Maneuvers: Seated upright 90 degrees                 Oral Care Recommendations: Oral care BID SLP Visit Diagnosis: Dysphagia, unspecified (R13.10) Plan: All goals met                      Elvina Sidle, M.S., CCC-SLP 10/27/2019, 12:55 PM

## 2019-10-27 NOTE — Progress Notes (Signed)
Inpatient Diabetes Program Recommendations  AACE/ADA: New Consensus Statement on Inpatient Glycemic Control (2015)  Target Ranges:  Prepandial:   less than 140 mg/dL      Peak postprandial:   less than 180 mg/dL (1-2 hours)      Critically ill patients:  140 - 180 mg/dL   Lab Results  Component Value Date   GLUCAP 117 (H) 10/27/2019   HGBA1C 12.4 (H) 10/21/2019    Review of Glycemic Control Results for Andrea [name], Andrea [name] (MRN [id number]) as of 10/27/2019 10:37  Ref. Range 10/26/2019 19:06 10/26/2019 21:12 10/26/2019 22:02 10/26/2019 23:56 10/27/2019 01:55 10/27/2019 03:52 10/27/2019 06:03 10/27/2019 06:28 10/27/2019 07:38  Glucose-Capillary Latest Ref Range: 70 - 99 mg/dL 131 (H) 73 113 (H) 97 86 131 (H) 132 (H) 134 (H) 117 (H)   Diabetes history: DM Type 1 Outpatient Diabetes medications:  Novolin 70/30 80 units in the AM and 85 units in the PM Current orders for Inpatient glycemic control:  Novolog 0-6 units tid with meals Novolog 70/30 mix 25 units bid Inpatient Diabetes Program Recommendations:    Agree with reduction of 70/30.  Patient only received one dose of Novolog 70/30 mix yesterday in the evening. Agree with reduction in 70/30.   Met with patient at the bedside.  She reports that she has reduced appetite and has lost 70 pounds.  Today her appetite is a little better. We discussed her low blood sugars at home.  She states that she did not start having lows until the last few weeks.  She confirms that she was taking the 70/30 as listed above.    Discussed with patient the use of CGM sensor to assist with monitoring and possibly detecting lows.  Patient aggreeable to use of CGM.  She will need to get refills from her Endocrinologist due to [facility] paperwork required.     Education done regarding application and changing CGM sensor (alternate every 14 days on back of arms), 1 hour warm-up, use of glucometer/where to buy strips, how to scan CGM for glucose reading and information for PCP.  Patient has been given [employer] reader and first sensor for use. Patient has also been given educational packet regarding use CGM sensor including the 1-800 toll free number for any questions, problems or needs related to the [facility] sensors or reader. Sensor applied by patient to (R) Arm at (1015).  Explained that glucose readings will not be available until 1 hour after application.   Discussed importance of preventing low blood sugars as well. Patient states that she did not have any "warning" signs with her recent lows.  Encouraged her to monitor closely and also taught her about the CGM arrows that will tell her when she monitors whether her blood sugar is increasing or decreasing.   Discussed with RN and Dr. Tana Coast.  Will follow.   Thanks  Adah Perl, RN, BC-ADM Inpatient Diabetes Coordinator Pager [phone] (8a-5p)

## 2019-10-27 NOTE — Progress Notes (Signed)
Physical Therapy Treatment Patient Details Name: Andrea [name] MRN: [id number] DOB: Aug 18, 1960 Today's Date: 10/27/2019    History of Present Illness 59 y.o. female with past medical history significant for diabetes insulin-dependent, hyperlipidemia, hypothyroidism, who was just evaluated at [facility], ER on 6/19 for hypoglycemia of 24, patient was initially nonverbal with generalized weakness at that time.  Hypoglycemia was corrected and she was discharged home.  Today family called EMS because patient's blood sugar was again low at 30.  Patient was also noted to have  left-sided weakness.  Patient was last seen normal at 2 AM.  When EMS was on the scene patient had 2 focal seizures.  Patient received IV Versed and glucose.  On arrival to the ED patient had still mild left-sided weakness and dysarthria.    PT Comments    Pt progressing towards physical therapy goals. Although she is functionally improving, pt is still moving very slow and requires hands-on guarding for safety. Pt reports she would rather return home at d/c - feel this is reasonable as pt states she has good support and a ride to outpatient therapies. Will continue to follow and progress as able per POC.     Follow Up Recommendations  Home health PT;Supervision/Assistance - 24 hour      Equipment Recommendations  3in1 (PT);Rolling walker with 5" wheels    Recommendations for Other Services       Precautions / Restrictions Precautions Precautions: Fall Precaution Comments: seizure Restrictions Weight Bearing Restrictions: No    Mobility  Bed Mobility Overal bed mobility: Needs Assistance Bed Mobility: Supine to Sit     Supine to sit: Supervision     General bed mobility comments: No assist required. Pt transitioned to EOB with increased time and use of rail.   Transfers Overall transfer level: Needs assistance Equipment used: Rolling walker (2 wheeled);None Transfers: Sit to/from Stand Sit to Stand: Min  guard         General transfer comment: Pt was able to power-up to full stand without the RW, however demonstrated good safety awareness and awarenss of deficits by asking for the RW before she began ambulating.   Ambulation/Gait Ambulation/Gait assistance: Min guard Gait Distance (Feet): 75 Feet Assistive device: Rolling walker (2 wheeled) Gait Pattern/deviations: Step-to pattern Gait velocity: Decreased Gait velocity interpretation: <1.8 ft/sec, indicate of risk for recurrent falls General Gait Details: Pt moving very slow and with decreased step/stride length and low floor clearance. Managed RW well without assist.    Stairs             Wheelchair Mobility    Modified Rankin (Stroke Patients Only) Modified Rankin (Stroke Patients Only) Pre-Morbid Rankin Score: No symptoms Modified Rankin: Moderately severe disability     Balance Overall balance assessment: Needs assistance Sitting-balance support: Single extremity supported;Feet supported Sitting balance-Leahy Scale: Fair Sitting balance - Comments: close supervision    Standing balance support: Single extremity supported Standing balance-Leahy Scale: Fair                              Cognition Arousal/Alertness: Awake/alert Behavior During Therapy: Flat affect Overall Cognitive Status: No family/caregiver present to determine baseline cognitive functioning                                 General Comments: Slow processing      Exercises  General Comments        Pertinent Vitals/Pain Pain Assessment: No/denies pain    Home Living                      Prior Function            PT Goals (current goals can now be found in the care plan section) Acute Rehab PT Goals Patient Stated Goal: to go to rehab prior to returning home;get back to baseline PT Goal Formulation: With patient Time For Goal Achievement: 11/05/19 Potential to Achieve Goals: Good Progress  towards PT goals: Progressing toward goals    Frequency    Min 3X/week      PT Plan Frequency needs to be updated;Discharge plan needs to be updated    Co-evaluation              AM-PAC PT "6 Clicks" Mobility   Outcome Measure  Help needed turning from your back to your side while in a flat bed without using bedrails?: None Help needed moving from lying on your back to sitting on the side of a flat bed without using bedrails?: None Help needed moving to and from a bed to a chair (including a wheelchair)?: A Little Help needed standing up from a chair using your arms (e.g., wheelchair or bedside chair)?: A Little Help needed to walk in hospital room?: A Little Help needed climbing 3-5 steps with a railing? : A Lot 6 Click Score: 19    End of Session Equipment Utilized During Treatment: Gait belt Activity Tolerance: Patient tolerated treatment well Patient left: in chair;with call bell/phone within reach;with chair alarm set;with nursing/sitter in room Nurse Communication: Mobility status PT Visit Diagnosis: Unsteadiness on feet (R26.81);Other abnormalities of gait and mobility (R26.89);Muscle weakness (generalized) (M62.81);Other symptoms and signs involving the nervous system (R29.898)     Time: 9826-4158 PT Time Calculation (min) (ACUTE ONLY): 28 min  Charges:  $Gait Training: 23-37 mins                     Rolinda Roan, PT, DPT Acute Rehabilitation Services Pager: [phone] Office: [phone]    Thelma Comp 10/27/2019, 12:31 PM

## 2019-10-27 NOTE — Consult Note (Signed)
Referring Provider: Dr. Estill Cotta Primary Care Physician:  Simon Rhein Aaron Mose, MD Primary Gastroenterologist: Althia Forts  Reason for Consultation:  Abnormal CT, concern for colonic neoplasm  HPI: Andrea [name] is a 59 y.o. female with history of type 1 DM with recent hypoglycemia and seizures presenting for consultation of abnormal CT with concern for colonic neoplasm.  Reports recent weight loss of 70 lbs over the last 6 months per chart review.  Patient also reports recent worsening of constipation over the last few months.  She denies any abdominal pain, melena, or hematochezia.  She further denies any nausea, vomiting, hematemesis, dysphagia, or GERD.  Takes 81 mg aspirin but denies blood thinner use.  Denies any family history of colon cancer or gastrointestinal malignancy.  Denies any family history of inflammatory bowel disease.   Last colonoscopy was 10/14/2014 and was pertinent for one 3 mm polyp in the descending colon (bx: adenomatous polyp) and one 8 mm polyp in the rectosigmoid colon (bx: adenomatous polyp), as well as sigmoid diverticulosis and internal hemorrhoids.  EGD 04/2019 for early satiety and weight loss showed gastritis (bx: H pylori positive) but was otherwise normal.   Past Medical History:  Diagnosis Date  . Diabetes mellitus without complication ([place])   . Hyperlipidemia   . Hypothyroidism   . Thyroid disease     Past Surgical History:  Procedure Laterality Date  . INCISION AND DRAINAGE ABSCESS Right 06/15/2017   Procedure: INCISION AND DRAINAGE ABSCESS;  Surgeon: Clayburn Pert, MD;  Location: [facility] ORS;  Service: General;  Laterality: Right;    Prior to Admission medications   Medication Sig Start Date End Date Taking? Authorizing Provider  aspirin EC 81 MG tablet Take 81 mg by mouth daily.   Yes [provider]  Cholecalciferol 125 MCG (5000 UT) TABS Take 5,000 Units by mouth daily.    Yes [provider]  levonorgestrel (MIRENA) 20  MCG/24HR IUD 1 each by Intrauterine route once.    Yes [provider]  levothyroxine (SYNTHROID) 150 MCG tablet Take 150 mcg by mouth every morning. 06/02/19  Yes [provider]  liothyronine (CYTOMEL) 5 MCG tablet Take 5 mcg by mouth daily. Take in addition to levothyroxine.   Yes [provider]  lisinopril (PRINIVIL,ZESTRIL) 20 MG tablet Take 20 mg by mouth daily.   Yes [provider]  potassium chloride SA (KLOR-CON) 20 MEQ tablet Take 1 tablet (20 mEq total) by mouth daily. Patient taking differently: Take 20 mEq by mouth 2 (two) times daily.  10/08/19  Yes Earleen Newport, MD  rosuvastatin (CRESTOR) 20 MG tablet Take 20 mg by mouth at bedtime.  08/04/19  Yes [provider]  tolterodine (DETROL LA) 4 MG 24 hr capsule Take 1 capsule (4 mg total) by mouth daily. 10/08/19 10/07/20 Yes Earleen Newport, MD  cloBAZam (ONFI) 10 MG tablet Take 0.5 tablets (5 mg total) by mouth 2 (two) times daily. Refill by PCP or neurology 10/27/19   Mendel Corning, MD  insulin NPH-regular Human (70-30) 100 UNIT/ML injection Inject 55 Units into the skin 2 (two) times daily before a meal. 10/27/19   Rai, Ripudeep K, MD  levETIRAcetam (KEPPRA) 750 MG tablet Take 2 tablets (1,500 mg total) by mouth 2 (two) times daily. 10/27/19   Rai, Ripudeep K, MD  polyethylene glycol (MIRALAX / GLYCOLAX) 17 g packet Take 17 g by mouth daily as needed for moderate constipation. 10/27/19   Rai, Ripudeep K, MD  senna-docusate (SENOKOT S) 8.6-50 MG  tablet Take 2 tablets by mouth at bedtime. 10/27/19   Rai, Vernelle Emerald, MD  pravastatin (PRAVACHOL) 40 MG tablet Take 40 mg by mouth every evening.   08/16/19  [provider]    Scheduled Meds: . aspirin EC  81 mg Oral Daily  . cloBAZam  5 mg Oral BID  . enoxaparin (LOVENOX) injection  40 mg Subcutaneous Q24H  . insulin aspart  0-6 Units Subcutaneous TID WC  . insulin aspart protamine- aspart  25 Units Subcutaneous BID WC  .  levETIRAcetam  1,500 mg Oral BID  . levothyroxine  150 mcg Oral Q0600  . liothyronine  5 mcg Oral Daily  . lisinopril  20 mg Oral Daily  . polyethylene glycol  17 g Oral Daily  . rosuvastatin  20 mg Oral QHS  . senna-docusate  1 tablet Oral BID  . sodium chloride flush  3 mL Intravenous Once   Continuous Infusions: PRN Meds:.acetaminophen **OR** acetaminophen, hydrALAZINE, LORazepam, ondansetron (ZOFRAN) IV  Allergies as of 10/19/2019 - Review Complete 10/19/2019  Allergen Reaction Noted  . Latex Hives 10/14/2014    Family History  Problem Relation Age of Onset  . Heart disease Father   . Breast cancer Neg Hx     Social History   Socioeconomic History  . Marital status: Single    Spouse name: Not on file  . Number of children: Not on file  . Years of education: Not on file  . Highest education level: Not on file  Occupational History  . Not on file  Tobacco Use  . Smoking status: Never Smoker  . Smokeless tobacco: Never Used  Vaping Use  . Vaping Use: Never used  Substance and Sexual Activity  . Alcohol use: No  . Drug use: No  . Sexual activity: Not on file  Other Topics Concern  . Not on file  Social History Narrative  . Not on file   Social Determinants of Health   Financial Resource Strain:   . Difficulty of Paying Living Expenses:   Food Insecurity:   . Worried About Charity fundraiser in the Last Year:   . Arboriculturist in the Last Year:   Transportation Needs:   . Film/video editor (Medical):   Marland Kitchen Lack of Transportation (Non-Medical):   Physical Activity:   . Days of Exercise per Week:   . Minutes of Exercise per Session:   Stress:   . Feeling of Stress :   Social Connections:   . Frequency of Communication with Friends and Family:   . Frequency of Social Gatherings with Friends and Family:   . Attends Religious Services:   . Active Member of Clubs or Organizations:   . Attends Archivist Meetings:   Marland Kitchen Marital Status:    Intimate Partner Violence:   . Fear of Current or Ex-Partner:   . Emotionally Abused:   Marland Kitchen Physically Abused:   . Sexually Abused:     Review of Systems: Review of Systems  Constitutional: Positive for malaise/fatigue and weight loss. Negative for chills and fever.  HENT: Negative for hearing loss and tinnitus.   Eyes: Negative for pain and redness.  Respiratory: Negative for cough and shortness of breath.   Cardiovascular: Negative for chest pain and palpitations.  Gastrointestinal: Positive for constipation. Negative for abdominal pain, blood in stool, diarrhea, heartburn, melena, nausea and vomiting.  Genitourinary: Negative for flank pain and hematuria.  Musculoskeletal: Negative for back pain and neck pain.  Skin:  Negative for itching and rash.  Neurological: Positive for seizures and weakness.  Endo/Heme/Allergies: Negative for environmental allergies. Does not bruise/bleed easily.  Psychiatric/Behavioral: Negative for substance abuse. The patient is not nervous/anxious.      Physical Exam: Vital signs: Vitals:   10/27/19 0800 10/27/19 1249  BP: (!) 142/91 123/78  Pulse:  (!) 108  Resp: 16 (!) 21  Temp: 98.6 F (37 C) 98.4 F (36.9 C)  SpO2:  100%   Last BM Date: 10/26/19 Physical Exam Constitutional:      General: She is sleeping. She is not in acute distress.    Appearance: She is well-developed.  HENT:     Head: Normocephalic and atraumatic.     Nose: Nose normal.     Mouth/Throat:     Mouth: Mucous membranes are moist.     Pharynx: Oropharynx is clear.  Eyes:     General: No scleral icterus.    Extraocular Movements: Extraocular movements intact.     Conjunctiva/sclera: Conjunctivae normal.  Cardiovascular:     Rate and Rhythm: Regular rhythm. Tachycardia present.     Pulses: Normal pulses.     Heart sounds: Normal heart sounds.  Pulmonary:     Effort: Pulmonary effort is normal. No respiratory distress.     Breath sounds: Normal breath sounds.   Abdominal:     General: There is no distension.     Palpations: Abdomen is soft. There is no mass.     Tenderness: There is no abdominal tenderness. There is no guarding or rebound.     Hernia: No hernia is present.  Musculoskeletal:        General: No swelling or tenderness.     Cervical back: Normal range of motion and neck supple.  Skin:    General: Skin is warm and dry.  Neurological:     Mental Status: She is oriented to person, place, and time. She is lethargic.  Psychiatric:        Mood and Affect: Mood normal.        Behavior: Behavior normal.      GI:  Lab Results: Recent Labs    10/25/19 0354 10/27/19 0240  WBC 8.6 8.5  HGB 9.0* 9.6*  HCT 27.0* 29.4*  PLT 445* 491*   BMET Recent Labs    10/25/19 0354 10/27/19 0240  NA 141 141  K 3.2* 3.5  CL 106 105  CO2 25 22  GLUCOSE 63* 153*  BUN 10 12  CREATININE 0.67 0.79  CALCIUM 8.7* 8.7*   LFT No results for input(s): PROT, ALBUMIN, AST, ALT, ALKPHOS, BILITOT, BILIDIR, IBILI in the last 72 hours. PT/INR No results for input(s): LABPROT, INR in the last 72 hours.   Studies/Results: No results found.  Impression: Concern for colonic neoplasm based on CT findings and recent constipation and unintentional weight loss of 70 lbs. -CT 6/22 showed sigmoid wall thickening and pericolonic edema with large volume of stool: stercoral colitis vs. colonic neoplasm. -CEA and Ca 19-9 pending  Mild normocytic anemia: Hgb 9.6, stable  DM type 1 complicated by hypoglycemia  Focal seizures, new-onset, now resolved.  Autoimmune and paraneoplastic panel was sent to [facility] on 10/22/2019, results pending.  Plan: Clear liquid diet with Nulytely prep starting tomorrow. Colonoscopy on Wednesday 6/30 with Dr. Paulita Fujita.  I thoroughly discussed the procedure, nature, benefits, and risks (including but not limited to bleeding, infection, perforation, anesthesia/cardiac and pulmonary complications).  Patient verbalized  understanding and gave verbal consent to proceed with  colonoscopy.  [facility] will follow.   LOS: 7 days   Salley Slaughter  PA-C 10/27/2019, 2:13 PM  Contact #  [phone]

## 2019-10-27 NOTE — Progress Notes (Signed)
OT Cancellation Note  Patient Details Name: Andrea [name] MRN: [id number] DOB: December 18, 1960   Cancelled Treatment:    Reason Eval/Treat Not Completed: Fatigue/lethargy limiting ability to participate;Other (comment) pt alseep upon OT arrival declining OT session d/t fatigue. Will continue to follow up for OT session as time allows.  Lanier Clam., COTA/L Acute Rehabilitation Services [phone]   Ihor Gully 10/27/2019, 4:54 PM

## 2019-10-27 NOTE — Plan of Care (Signed)
  Problem: Education: Goal: Knowledge of General Education information will improve Description: Including pain rating scale, medication(s)/side effects and non-pharmacologic comfort measures Outcome: Progressing   Problem: Health Behavior/Discharge Planning: Goal: Ability to manage health-related needs will improve Outcome: Progressing   Problem: Clinical Measurements: Goal: Ability to maintain clinical measurements within normal limits will improve Outcome: Progressing   Problem: Activity: Goal: Risk for activity intolerance will decrease Outcome: Progressing   Problem: Nutrition: Goal: Adequate nutrition will be maintained Outcome: Progressing   Problem: Coping: Goal: Level of anxiety will decrease Outcome: Progressing   Problem: Elimination: Goal: Will not experience complications related to bowel motility Outcome: Progressing   Problem: Pain Managment: Goal: General experience of comfort will improve Outcome: Progressing   Problem: Safety: Goal: Ability to remain free from injury will improve Outcome: Progressing   Problem: Skin Integrity: Goal: Risk for impaired skin integrity will decrease Outcome: Progressing   Problem: Education: Goal: Expressions of having a comfortable level of knowledge regarding the disease process will increase Outcome: Progressing   Problem: Coping: Goal: Ability to adjust to condition or change in health will improve Outcome: Progressing   Problem: Health Behavior/Discharge Planning: Goal: Compliance with prescribed medication regimen will improve Outcome: Progressing   Problem: Medication: Goal: Risk for medication side effects will decrease Outcome: Progressing   Problem: Clinical Measurements: Goal: Complications related to the disease process, condition or treatment will be avoided or minimized Outcome: Progressing   Problem: Safety: Goal: Verbalization of understanding the information provided will improve Outcome:  Progressing   Problem: Self-Concept: Goal: Level of anxiety will decrease Outcome: Progressing

## 2019-10-27 NOTE — Progress Notes (Signed)
[facility]                                                                              Patient Demographics  Andrea [name], is a 59 y.o. female, DOB - November 14, 1960, LKT:[id number]  Admit date - 10/19/2019   Admitting Physician Andrea Shiley, MD  Outpatient Primary MD for the patient is [name], Andrea Mose, MD  Outpatient specialists:   LOS - 7  days   Medical records reviewed and are as summarized below:    Chief Complaint  Patient presents with  . Code Stroke       Brief summary   Patient is a 59 year old female with history of insulin-dependent diabetes mellitus, hyperlipidemia, hypothyroidism who was brought to the emergency department for evaluation of hypoglycemia. She presented to the emergency department on 6/19 for similar problem and was discharged to home after correction ofhypoglycemia. When EMS arrived at home, patient had 2 focal seizures and was given IV Versed and glucose. On presentation she had mild left-sided weakness and dysarthria. She takes 70/30 insulin at home. On presentation, CT head did not show any acute abnormality but MRI findings are consistent with recent seizure activity. Neurology was consulted. Hospital course remarkable for severe constipation.she developed twitching of face muscles on 09/2319. She underwent LP by neurology suspecting for autoimmune/inflammatory disorder. Started on high-dose steroids. PT recommending SNF on DC    Assessment & Plan   Principal problem Acute metabolic encephalopathy -Likely secondary to hypoglycemia versus seizure -Currently alert and oriented, close to her baseline   Active problems Seizures -New onset, likely due to hypoglycemia however EEG also showed epileptiform discharges. -Neurology was consulted, patient started on Keppra and Clobazam -No abnormal enhancement seen on MRI, again had facial muscle twitching on 6/23. -Neurology did an LP, started on Solu-Medrol for possible  autoimmune/inflammatory disorder/paraneoplastic disorder.  Unclear etiology, CSF IgG cell count, HSV, myelin basic protein, VDRL normal.  CSF culture negative -Neurology recommended IV Solu-Medrol 1 g daily for 5 doses, completed on 6/27  Type I diabetic with hypoglycemia episodes -Reportedly on Novolin 70/30 at home 80 units in the morning and 85 units at night however having intermittent hypoglycemia episodes while inpatient -Reduced to 70/30 insulin 55 units twice daily on 6/27 however patient received 1 evening dose on 6/27, overnight given amp of D50.  CBGs stable today, 285 at 1122 -Discussed with diabetic coordinator, patient needs to have consistent insulin twice daily dosing to address adequate glycemic control.  Will place on insulin 70/30 25 units twice daily with meals -SLP evaluation   Anorexia/weight loss, early satiety -CT abdomen pelvis showed fecal impaction, bilateral hydroureteronephrosis due to distended rectum, approximately 6.3 cm sigmoid colonic wall thickening may represent focal colitis including stercoral colitis versus colonic neoplasm, large volume of stool throughout the remainder of colon --On detailed discussion, patient reports significant amount of weight, 70lbs in 3 months.  I reviewed in care everywhere, patient has been having extensive [name] work-up outpatient with Andrea [name] (Andrea [name]), had upper endoscopy on 04/09/2019 for the above complaints, which showed chronic gastritis with H. pylori.  No metaplasia or dysplasia or carcinoma. -She had  a normal GE study on 04/17/2019 which showed no gastroparesis -Patient had flex sig 2016 which had shown diverticulosis with nonthrombosed external hemorrhoids, polyps, otherwise benign -Given CT findings, will request [name] if colonoscopy/flex sig possible inpatient to rule out colon malignancy, consult placed with Andrea [name]  - follow CEA, CA 19-9.  CT chest showed scattered areas of atelectasis versus scarring in both lungs,  single 3 mm nonspecific left upper lobe nodule, short-term follow-up CT recommended in 3 months   Hypothyroidism -Follows with endocrinology, Andrea. Dina [name], continue Synthroid  Left upper lobe nodule, 3 mm incidental finding -Repeat CT chest in 3 months, past smoker  Severe constipation -CT abdomen/pelvis findings noted, placed on Senokot-S, MiraLAX daily  Generalized debility, deconditioning -PT evaluation recommended today recommended home health, 24-hour supervision, 3 n1, DME rolling walker  Severe protein calorie malnutrition Estimated body mass index is 18.48 kg/m as calculated from the following:   Height as of 10/18/19: 5\' 7"  (1.702 m).   Weight as of 10/18/19: 53.5 kg.  Code Status: Full CODE STATUS DVT Prophylaxis:  Lovenox  Family Communication: Discussed all imaging results, lab results, explained to the patient   Disposition Plan:     Status is: Inpatient  Remains inpatient appropriate because:Inpatient level of care appropriate due to severity of illness   Dispo: The patient is from: Home              Anticipated d/c is to: Home              Anticipated d/c date is: 2 days              Patient currently is not medically stable to d/c.  Has not received consistent insulin dosing, still having hypoglycemia, requested [name] work-up      Time Spent in minutes 40 minutes Procedures:  None  Consultants:   Gastroenterology  Antimicrobials:   Anti-infectives (From admission, onward)   None          Medications  Scheduled Meds: . aspirin EC  81 mg Oral Daily  . cloBAZam  5 mg Oral BID  . enoxaparin (LOVENOX) injection  40 mg Subcutaneous Q24H  . insulin aspart  0-6 Units Subcutaneous TID WC  . insulin aspart protamine- aspart  25 Units Subcutaneous BID WC  . levETIRAcetam  1,500 mg Oral BID  . levothyroxine  150 mcg Oral Q0600  . liothyronine  5 mcg Oral Daily  . lisinopril  20 mg Oral Daily  . rosuvastatin  20 mg Oral QHS  . sodium chloride  flush  3 mL Intravenous Once   Continuous Infusions: PRN Meds:.acetaminophen **OR** acetaminophen, hydrALAZINE, LORazepam, ondansetron (ZOFRAN) IV, polyethylene glycol      Subjective:   Andrea [name] was seen and examined today.  Note and awake however overnight received D50, only received 1 dose of insulin yesterday evening.  Patient denies dizziness, chest pain, shortness of breath, abdominal pain.  Objective:   Vitals:   10/26/19 2357 10/27/19 0355 10/27/19 0605 10/27/19 0800  BP: (!) 145/97 (!) 143/96 (!) 141/89 (!) 142/91  Pulse: (!) 108 (!) 115 100   Resp: 14 16 14 16   Temp: 97.8 F (36.6 C) 98.6 F (37 C) 98.6 F (37 C) 98.6 F (37 C)  TempSrc: Oral Oral Oral Oral  SpO2: 99% 100% 100%     Intake/Output Summary (Last 24 hours) at 10/27/2019 1214 Last data filed at 10/26/2019 1446 Gross per 24 hour  Intake 0 ml  Output --  Net 0  ml     Wt Readings from Last 3 Encounters:  10/18/19 53.5 kg  10/08/19 49.4 kg  08/16/19 66.7 kg     Exam  General: Alert and oriented x 3, NAD  Cardiovascular: S1 S2 auscultated, no murmurs, RRR  Respiratory: Clear to auscultation bilaterally, no wheezing, rales or rhonchi  Gastrointestinal: Soft, nontender, nondistended, + bowel sounds  Ext: no pedal edema bilaterally  Neuro: No new deficits  Musculoskeletal: No digital cyanosis, clubbing  Skin: No rashes  Psych: Normal affect and demeanor, alert and oriented x3    Data Reviewed:  I have personally reviewed following labs and imaging studies  Micro Results Recent Results (from the past 240 hour(s))  SARS Coronavirus 2 by RT PCR (hospital order, performed in [place] hospital lab) Nasopharyngeal Nasopharyngeal Swab     Status: None   Collection Time: 10/19/19  9:30 PM   Specimen: Nasopharyngeal Swab  Result Value Ref Range Status   SARS Coronavirus 2 NEGATIVE NEGATIVE Final    Comment: (NOTE) SARS-CoV-2 target nucleic acids are NOT DETECTED.  The SARS-CoV-2  RNA is generally detectable in upper and lower respiratory specimens during the acute phase of infection. The lowest concentration of SARS-CoV-2 viral copies this assay can detect is 250 copies / mL. A negative result does not preclude SARS-CoV-2 infection and should not be used as the sole basis for treatment or other patient management decisions.  A negative result may occur with improper specimen collection / handling, submission of specimen other than nasopharyngeal swab, presence of viral mutation(s) within the areas targeted by this assay, and inadequate number of viral copies (<250 copies / mL). A negative result must be combined with clinical observations, patient history, and epidemiological information.  Fact Sheet for Patients:   StrictlyIdeas.no  Fact Sheet for Healthcare Providers: BankingDealers.co.za  This test is not yet approved or  cleared by the [place] FDA and has been authorized for detection and/or diagnosis of SARS-CoV-2 by FDA under an Emergency Use Authorization (EUA).  This EUA will remain in effect (meaning this test can be used) for the duration of the COVID-19 declaration under Section 564(b)(1) of the Act, 21 U.S.C. section 360bbb-3(b)(1), unless the authorization is terminated or revoked sooner.  Performed at [facility], [place] [address], [place], [place] [address]   Stool culture (children & immunocomp patients)     Status: None   Collection Time: 10/21/19  2:34 PM   Specimen: Stool  Result Value Ref Range Status   Salmonella/Shigella Screen Final report  Final   Campylobacter Culture Final report  Final   E coli, Shiga toxin Assay Negative Negative Final    Comment: (NOTE) Performed At: [facility] [address] [place], [place] [id number] Rush Farmer MD [id number]   OVA + PARASITE EXAM     Status: None   Collection Time: 10/21/19  2:34 PM   Specimen: Stool  Result  Value Ref Range Status   OVA + PARASITE EXAM Final report  Final    Comment: (NOTE) These results were obtained using wet preparation(s) and trichrome stained smear. This test does not include testing for Cryptosporidium parvum, Cyclospora, or Microsporidia. Performed At: [place], [place] [id number] Caprice Red MD ZE:[id number]    Source of Sample STOOL  Final    Comment: Performed at [facility], [place] [address], [place], [place] [address]  STOOL CULTURE REFLEX - RSASHR     Status: None  Collection Time: 10/21/19  2:34 PM  Result Value Ref Range Status   Stool Culture result 1 (RSASHR) Comment  Final    Comment: (NOTE) No Salmonella or Shigella recovered. Performed At: [facility] [address] [place], [place] [id number] Rush Farmer MD OX:[id number]   STOOL CULTURE Reflex - CMPCXR     Status: None   Collection Time: 10/21/19  2:34 PM  Result Value Ref Range Status   Stool Culture result 1 (CMPCXR) Comment  Final    Comment: (NOTE) No Campylobacter species isolated. Performed At: [facility] [place], [place] [id number] Rush Farmer MD WL:[id number]   CSF culture with Stat gram stain     Status: None   Collection Time: 10/22/19  3:15 PM   Specimen: CSF; Cerebrospinal Fluid  Result Value Ref Range Status   Specimen Description CSF  Final   Special Requests NONE  Final   Gram Stain   Final    WBC PRESENT, PREDOMINANTLY PMN NO ORGANISMS SEEN CYTOSPIN SMEAR    Culture   Final    NO GROWTH 3 DAYS Performed at [facility], [address] [place], [place] [address]    Report Status 10/25/2019 FINAL  Final    Radiology Reports CT CHEST W CONTRAST  Result Date: 10/23/2019 CLINICAL DATA:  Seizure, abnormal CT abdomen with basilar lung changes EXAM: CT CHEST WITH CONTRAST TECHNIQUE: Multidetector CT imaging of the chest was performed during intravenous contrast administration.  Sagittal and coronal MPR images reconstructed from axial data set. CONTRAST:  21mL OMNIPAQUE IOHEXOL 300 MG/ML  SOLN IV COMPARISON:  CT abdomen and pelvis 10/21/2019 FINDINGS: Cardiovascular: Atherosclerotic calcification aorta, proximal great vessels and coronary arteries. Aorta normal caliber. Heart normal size. No pericardial effusion. Mediastinum/Nodes: Esophagus unremarkable. Base of cervical region normal appearance. No thoracic adenopathy. Lungs/Pleura: Linear areas of atelectasis versus scarring scattered in both lungs. Patchy areas of peribronchovascular density are seen in both lungs, some associated with minimal bronchiectasis, favor post inflammatory. Central peribronchial thickening present. No segmental consolidation, pleural effusion or pneumothorax. 3 mm LEFT upper lobe nodule image 30. No additional pulmonary mass/nodule. Upper Abdomen: Stomach distended by food debris. Remaining upper abdomen unremarkable Musculoskeletal: No osseous abnormalities. Scattered chest wall and axillary soft tissue edema, nonspecific. IMPRESSION: Scattered areas of atelectasis versus scarring in both lungs with few scattered areas of peribronchovascular opacity bilaterally, a few of which are associated with minimal bronchiectasis, favor postinflammatory scarring. Single 3 mm nonspecific LEFT upper lobe nodule. Short-term follow-up CT recommended in 3 months to reassess the areas of suspected scarring to ensure stability. Aortic Atherosclerosis (ICD10-I70.0). Electronically Signed   By: Lavonia Dana M.D.   On: 10/23/2019 13:00   MR BRAIN WO CONTRAST  Result Date: 10/19/2019 CLINICAL DATA:  Stroke follow-up. Left-sided weakness. Hypoglycemia. Witnessed seizures by EMS. Clinically suspected Todd's paralysis. EXAM: MRI HEAD WITHOUT CONTRAST TECHNIQUE: Multiplanar, multiecho pulse sequences of the brain and surrounding structures were obtained without intravenous contrast. COMPARISON:  Head CT 10/19/2019 FINDINGS: The  study is mildly motion degraded. Brain: There is mild restricted diffusion involving cortex in the medial right frontal lobe, right insula, and likely right hippocampus which does not conform to a vascular territory and is without associated edema. No intracranial hemorrhage, mass, midline shift, or extra-axial fluid collection is identified. No significant white matter disease is seen for age. The ventricles and sulci are normal. Vascular: Major intracranial vascular flow voids are preserved. Skull and upper cervical spine: Unremarkable bone marrow signal para Sinuses/Orbits:  Bilateral cataract extraction. Small mucous retention cysts in the maxillary sinuses. Clear mastoid air cells. Other: None. IMPRESSION: Mild restricted diffusion in the medial right frontal lobe, right insula, and likely right hippocampus most consistent with recent seizure activity. Electronically Signed   By: Logan Bores M.D.   On: 10/19/2019 16:48   MR BRAIN W CONTRAST  Result Date: 10/20/2019 CLINICAL DATA:  Seizure.  Abnormal MRI. EXAM: MRI HEAD WITH CONTRAST TECHNIQUE: Multiplanar, multiecho pulse sequences of the brain and surrounding structures were obtained with intravenous contrast. CONTRAST:  62mL GADAVIST GADOBUTROL 1 MMOL/ML IV SOLN COMPARISON:  MRI head without contrast 05/21/2019 FINDINGS: Brain: Review of the MRI from yesterday reveals restricted diffusion in the right medial frontal cortex, also in the right insula and right hippocampus. Note that the lesions are best seen on diffusion-weighted imaging with no significant associated FLAIR cortical edema. These findings are unilateral. No abnormal enhancement on today's study. Ventricle size is normal. Diffusion-weighted imaging was not repeated today IMPRESSION: Normal enhancement of the brain. Based on the findings yesterday, differential includes seizure related cortical activity, autoimmune related encephalitis, and less likely viral encephalitis. Consider lumbar  puncture. Short-term follow-up MRI with contrast may be helpful. Electronically Signed   By: Franchot Gallo M.D.   On: 10/20/2019 16:40   CT ABDOMEN PELVIS W CONTRAST  Result Date: 10/22/2019 CLINICAL DATA:  Unintended weight loss. EXAM: CT ABDOMEN AND PELVIS WITH CONTRAST TECHNIQUE: Multidetector CT imaging of the abdomen and pelvis was performed using the standard protocol following bolus administration of intravenous contrast. CONTRAST:  110mL OMNIPAQUE IOHEXOL 300 MG/ML  SOLN COMPARISON:  None. FINDINGS: Lower chest: Majority of the chest is included in the field of view. Scattered linear and bandlike opacities throughout both lungs are typical of scarring. There is a subpleural 5 mm left upper lobe pulmonary nodule, series 6, image 26. Scattered peribronchovascular irregular opacities in the right lower lobe series 6, image 53, right middle lobe, series 6, image 39, left lower lobe, same image, left upper lobe series 6, image 29, and right mid lung same image. No dominant pulmonary mass. Hepatobiliary: Scattered subcentimeter hypodensities in the liver are too small to characterize but likely small cysts or biliary hamartomas. No evidence of suspicious liver lesion. Gallbladder physiologically distended, no calcified stone. No biliary dilatation. Pancreas: No ductal dilatation or inflammation. Spleen: Normal in size without focal abnormality. Adrenals/Urinary Tract: Normal adrenal glands. There is right hydroureteronephrosis. The ureter is dilated to pelvic brim. There is mild left hydronephrosis and proximal hydroureter, with similar ureteral dilatation. Diminished excretion on delayed phase imaging, right greater than left. No evidence of focal renal mass. Urinary bladder is displaced anteriorly by large stool ball in the rectum. Stomach/Bowel: Large inspissated stool ball distends the rectum with rectal distention of 8.6 cm. Associated rectal wall thickening and perirectal edema. No pneumatosis.  Approximately 6.3 cm length area of sigmoid colonic wall thickening with pericolonic edema just proximal to the large volume of rectosigmoid stool. Large volume of stool throughout the remainder of the colon, there is significant sigmoid colonic redundancy. No additional areas of colonic wall thickening. The appendix is not confidently visualized. The stomach is unremarkable. No small bowel obstruction or abnormal distention. No definite small bowel inflammation. Vascular/Lymphatic: Moderate aorto bi-iliac atherosclerosis, advanced for age. No aortic aneurysm. Patent portal vein. There is no adenopathy in the abdomen or pelvis. Reproductive: Calcified uterine fibroids. The uterus is displaced into the right pelvis and anteriorly by large rectosigmoid stool burden. No evidence of adnexal  mass. Other: Perirectal and distal pericolonic edema with small amount of presacral fluid. No other ascites. There is no free air or perforation. Mild generalized body wall edema. Musculoskeletal: Mild L1 superior endplate compression fracture, age indeterminate. No evidence of focal bone lesion. IMPRESSION: 1. Large inspissated stool ball distends the rectum with rectal distention of 8.6 cm. Associated rectal wall thickening and perirectal edema, suspicious for fecal impaction. The stool distended rectum causes mass effect on the urinary bladder, uterus, and distal ureters with bilateral hydroureteronephrosis, right greater than left. 2. Approximately 6.3 cm length area of sigmoid colonic wall thickening with pericolonic edema just proximal to the large volume of rectosigmoid stool. This may represent focal colitis, including stercoral colitis versus colonic neoplasm. 3. Large volume of stool throughout the remainder of the colon. There is significant sigmoid colonic redundancy. 4. Scattered peribronchovascular irregular opacities in the lung bases, likely infectious or inflammatory. There is a 5 mm subpleural left upper lobe  pulmonary nodule. No follow-up needed if patient is low-risk (and has no known or suspected primary neoplasm). Non-contrast chest CT can be considered in 12 months if patient is high-risk. This recommendation follows the consensus statement: Guidelines for Management of Incidental Pulmonary Nodules Detected on CT Images: From the Fleischner Society 2017; Radiology 2017; 284:228-243. 5. Mild L1 superior endplate compression fracture, age indeterminate. Aortic Atherosclerosis (ICD10-I70.0). Electronically Signed   By: Keith Rake M.D.   On: 10/22/2019 00:00   DG CHEST PORT 1 VIEW  Result Date: 10/19/2019 CLINICAL DATA:  59 year old female with left-sided weakness. Concern for stroke. EXAM: PORTABLE CHEST 1 VIEW COMPARISON:  Chest radiograph dated 01/14/2005. FINDINGS: No focal consolidation, pleural effusion or pneumothorax. The cardiac silhouette is within limits. No acute osseous pathology. IMPRESSION: No active disease. Electronically Signed   By: Anner Crete M.D.   On: 10/19/2019 19:28   EEG adult  Result Date: 10/19/2019 Lora Havens, MD     10/19/2019  8:23 PM Patient Name: Cheril Slattery MRN: [id number] Epilepsy Attending: Lora Havens Referring Physician/Provider: Dr Amie [place] Date: 10/19/2019 Duration: 30.57 mins Patient history: 59yo F who presented with ams and seizure. EEG to evaluate for seizure, Level of alertness: Awake AEDs during EEG study:  keppra Technical aspects: This EEG study was done with scalp electrodes positioned according to the 10-20 International system of electrode placement. Electrical activity was acquired at a sampling rate of 500Hz  and reviewed with a high frequency filter of 70Hz  and a low frequency filter of 1Hz . EEG data were recorded continuously and digitally stored. Description: No posterior dominant rhythm was seen. EEG showed generalize, maximal bifrontal periodic epileptiform discharges with triphasic morphology at 0.25-0.5Hz . EEG also showed  continuous generalized 8-9Hz  alpha activity.  Hyperventilation and photic stimulation were not performed.   ABNORMALITY -Periodic epileptiform discharges with triphasic morphology, generalized, maximal bifrontal IMPRESSION: This study showed evidence of generalized epileptogenicity with epileptiform discharges at 0.25-0.5hz  which is on the ictal-interictal continuum. No definite seizures were seen throughout the recording. Andrea Cheral Marker was notified. Priyanka Barbra Sarks   Overnight EEG with video  Result Date: 10/20/2019 Lora Havens, MD     10/21/2019  1:03 PM Patient Name: Haley Fuerstenberg MRN: [id number] Epilepsy Attending: Lora Havens Referring Physician/Provider: Dr Milus Banister Duration: 10/19/2019 2027 to 10/20/2019 2027  Patient history: 59yo F who presented with ams and seizure. EEG to evaluate for seizure,  Level of alertness: Awake  AEDs during EEG study:  keppra  Technical aspects: This EEG study was done with scalp  electrodes positioned according to the 10-20 International system of electrode placement. Electrical activity was acquired at a sampling rate of 500Hz  and reviewed with a high frequency filter of 70Hz  and a low frequency filter of 1Hz . EEG data were recorded continuously and digitally stored.  Description: No posterior dominant rhythm was seen. EEG initially showed generalized, maximal bifrontal periodic epileptiform discharges with triphasic morphology at 0.25-0.5Hz . Gradually, eeg showed right frontocentral periodic epileptiform discharges at 1Hz . EEG also showed continuous generalized 8-9Hz  alpha activity. Event button was pressed multiple times during which patient was noted to have left side face twitching, lasting about 30 seconds to one minute. Concomitant eeg showed rhythmic sharply contoured 5-9h generalized theta-alpha activity which evolved into 3-5Hz  theta-delta activity.  ABNORMALITY - Seizure, generalized - Periodic epileptiform discharges with triphasic morphology,  generalized, maximal bifrontal - Continuous slow, generalized  IMPRESSION: This study showed multiple seizures with generalized onset during which patient was noted to have left face twitching, lasting 30 seconds to one minute. Additionally, there are epileptiform discharges at 0.25-0.5hz  which is on the ictal-interictal continuum as well as moderate diffuse encephalopathy, non specific to etiology.  Lora Havens   CT HEAD CODE STROKE WO CONTRAST  Result Date: 10/19/2019 CLINICAL DATA:  Code stroke.  Left-sided weakness. EXAM: CT HEAD WITHOUT CONTRAST TECHNIQUE: Contiguous axial images were obtained from the base of the skull through the vertex without intravenous contrast. COMPARISON:  None. FINDINGS: The study is mildly motion degraded. Brain: Within limitations of motion, no acute infarct, intracranial hemorrhage, mass, midline shift, or extra-axial fluid collection is identified. The ventricles and sulci are normal. Vascular: Calcified atherosclerosis at the skull base. No hyperdense vessel. Skull: No fracture or suspicious osseous lesion. Sinuses/Orbits: Partially visualized mild mucosal thickening in the left maxillary sinus. Bilateral cataract extraction. Other: None. ASPECTS [facility] Stroke Program Early CT Score) - Ganglionic level infarction (caudate, lentiform nuclei, internal capsule, insula, M1-M3 cortex): 7 - Supraganglionic infarction (M4-M6 cortex): 3 Total score (0-10 with 10 being normal): 10 IMPRESSION: 1. No evidence of acute intracranial abnormality within limitations of mild motion. 2. ASPECTS is 10. These results were communicated to Andrea. Rory Percy at 3:50 pm on 10/19/2019 by text page via the [facility] messaging system. Electronically Signed   By: Logan Bores M.D.   On: 10/19/2019 15:50    Lab Data:  CBC: Recent Labs  Lab 10/21/19 0808 10/25/19 0354 10/27/19 0240  WBC 7.7 8.6 8.5  NEUTROABS  --  7.1 6.5  HGB 9.6* 9.0* 9.6*  HCT 29.7* 27.0* 29.4*  MCV 89.5 86.0 87.0  PLT 340  445* 253*   Basic Metabolic Panel: Recent Labs  Lab 10/20/19 1709 10/21/19 0808 10/22/19 0845 10/25/19 0354 10/27/19 0240  NA 145 143 140 141 141  K 3.5 3.0* 3.6 3.2* 3.5  CL 115* 111 110 106 105  CO2 20* 23 18* 25 22  GLUCOSE 100* 187* 153* 63* 153*  BUN <5* <5* <5* 10 12  CREATININE 0.60 0.59 0.58 0.67 0.79  CALCIUM 8.2* 8.1* 8.1* 8.7* 8.7*   GFR: Estimated Creatinine Clearance: 63.9 mL/min (by C-G formula based on SCr of 0.79 mg/dL). Liver Function Tests: No results for input(s): AST, ALT, ALKPHOS, BILITOT, PROT, ALBUMIN in the last 168 hours. No results for input(s): LIPASE, AMYLASE in the last 168 hours. No results for input(s): AMMONIA in the last 168 hours. Coagulation Profile: No results for input(s): INR, PROTIME in the last 168 hours. Cardiac Enzymes: No results for input(s): CKTOTAL, CKMB, CKMBINDEX, TROPONINI in the  last 168 hours. BNP (last 3 results) No results for input(s): PROBNP in the last 8760 hours. HbA1C: No results for input(s): HGBA1C in the last 72 hours. CBG: Recent Labs  Lab 10/27/19 0352 10/27/19 0603 10/27/19 0628 10/27/19 0738 10/27/19 1122  GLUCAP 131* 132* 134* 117* 285*   Lipid Profile: No results for input(s): CHOL, HDL, LDLCALC, TRIG, CHOLHDL, LDLDIRECT in the last 72 hours. Thyroid Function Tests: No results for input(s): TSH, T4TOTAL, FREET4, T3FREE, THYROIDAB in the last 72 hours. Anemia Panel: No results for input(s): VITAMINB12, FOLATE, FERRITIN, TIBC, IRON, RETICCTPCT in the last 72 hours. Urine analysis:    Component Value Date/Time   COLORURINE COLORLESS (A) 10/08/2019 1848   APPEARANCEUR CLEAR (A) 10/08/2019 1848   LABSPEC 1.017 10/08/2019 1848   PHURINE 6.0 10/08/2019 1848   GLUCOSEU >=500 (A) 10/08/2019 1848   HGBUR NEGATIVE 10/08/2019 1848   BILIRUBINUR NEGATIVE 10/08/2019 1848   KETONESUR NEGATIVE 10/08/2019 1848   PROTEINUR NEGATIVE 10/08/2019 1848   NITRITE NEGATIVE 10/08/2019 1848   LEUKOCYTESUR NEGATIVE  10/08/2019 1848     Tom Macpherson M.D. [facility] 10/27/2019, 12:14 PM   Call night coverage person covering after 7pm

## 2019-10-28 DIAGNOSIS — E162 Hypoglycemia, unspecified: Secondary | ICD-10-CM

## 2019-10-28 DIAGNOSIS — E876 Hypokalemia: Secondary | ICD-10-CM

## 2019-10-28 LAB — BASIC METABOLIC PANEL
Anion gap: 8 (ref 5–15)
BUN: 13 mg/dL (ref 6–20)
CO2: 26 mmol/L (ref 22–32)
Calcium: 8.4 mg/dL — ABNORMAL LOW (ref 8.9–10.3)
Chloride: 105 mmol/L (ref 98–111)
Creatinine, Ser: 0.59 mg/dL (ref 0.44–1.00)
GFR calc Af Amer: >60 mL/min (ref >60)
GFR calc non Af Amer: >60 mL/min (ref >60)
Glucose, Bld: 56 mg/dL — ABNORMAL LOW (ref 70–99)
Potassium: 3.1 mmol/L — ABNORMAL LOW (ref 3.5–5.1)
Sodium: 139 mmol/L (ref 135–145)

## 2019-10-28 LAB — GLUCOSE, CAPILLARY
Glucose-Capillary: 100 mg/dL — ABNORMAL HIGH (ref 70–99)
Glucose-Capillary: 117 mg/dL — ABNORMAL HIGH (ref 70–99)
Glucose-Capillary: 130 mg/dL — ABNORMAL HIGH (ref 70–99)
Glucose-Capillary: 169 mg/dL — ABNORMAL HIGH (ref 70–99)
Glucose-Capillary: 48 mg/dL — ABNORMAL LOW (ref 70–99)
Glucose-Capillary: 51 mg/dL — ABNORMAL LOW (ref 70–99)
Glucose-Capillary: 80 mg/dL (ref 70–99)
Glucose-Capillary: 88 mg/dL (ref 70–99)
Glucose-Capillary: 94 mg/dL (ref 70–99)

## 2019-10-28 LAB — CEA: CEA: 3.3 ng/mL (ref 0.0–4.7)

## 2019-10-28 LAB — CANCER ANTIGEN 19-9: CA 19-9: <2 U/mL (ref 0–35)

## 2019-10-28 MED ORDER — INSULIN ASPART PROT & ASPART (70-30 MIX) 100 UNIT/ML ~~LOC~~ SUSP: 15.0000 [IU] | Freq: Two times a day (BID) | SUBCUTANEOUS | Status: DC

## 2019-10-28 MED ORDER — DEXTROSE 50 % IV SOLN
INTRAVENOUS | Status: AC
  Administered 2019-10-28: 20 mL
  Filled 2019-10-28: qty 50

## 2019-10-28 NOTE — Progress Notes (Signed)
[facility]                                                                              Patient Demographics  Andrea [name], is a 59 y.o. female, DOB - 1960-07-20, TIR:[id number]  Admit date - 10/19/2019   Admitting Physician Elmarie Shiley, MD  Outpatient Primary MD for the patient is Bright, Aaron Mose, MD  Outpatient specialists:   LOS - 8  days  Brief summary   Patient is a 59 year old female with history of insulin-dependent diabetes mellitus, hyperlipidemia, hypothyroidism who was brought to the emergency department for evaluation of hypoglycemia. She presented to the emergency department on 6/19 for similar problem and was discharged to home after correction ofhypoglycemia. When EMS arrived at home, patient had 2 focal seizures and was given IV Versed and glucose. On presentation she had mild left-sided weakness and dysarthria. She takes 70/30 insulin at home. On presentation, CT head did not show any acute abnormality but MRI findings are consistent with recent seizure activity. Neurology was consulted. Hospital course remarkable for severe constipation.she developed twitching of face muscles on 09/2319. She underwent LP by neurology suspecting for autoimmune/inflammatory disorder. Started on high-dose steroids. PT recommending SNF on DC    Assessment & Plan   Principal problem Acute metabolic encephalopathy -Likely secondary to hypoglycemia versus seizure -Currently alert and oriented, close to her baseline  Seizures -New onset, likely due to hypoglycemia however EEG also showed epileptiform discharges. -Neurology was consulted, patient started on Keppra and Clobazam -No abnormal enhancement seen on MRI, again had facial muscle twitching on 6/23. -Neurology did an LP, started on Solu-Medrol for possible autoimmune/inflammatory disorder/paraneoplastic disorder.  Unclear etiology, CSF IgG cell count, HSV, myelin basic protein, VDRL normal.  CSF  culture negative -Neurology recommended IV Solu-Medrol 1 g daily for 5 doses, completed on 6/27 -Autoimmune and paraneoplastic labs sent to [facility] -Continued on high-dose Keppra and clobazam -Neurology recommended follow-up with Dr. Delice Lesch in 6 weeks and consider repeat MRI brain  Type I diabetic with hypoglycemia episodes -Reportedly on Novolin 70/30 at home 80 units in the morning and 85 units at night however having intermittent hypoglycemia episodes while inpatient -Insulin 70/30 dose has gradually been titrated down, still with hypoglycemic episodes -Decrease insulin dose further down to 15 units twice daily, currently on liquid diet awaiting colonoscopy tomorrow  Abnormal CT abdomen with chronic constipation and significant weight loss -Gastroenterology consulting, CEA and CA 19-9 are pending, plan for colonoscopy on Wednesday -Plan to start prep today  Hypothyroidism -Follows with endocrinology, Dr. Dina Rich, continue Synthroid  Left upper lobe nodule, 3 mm incidental finding -Repeat CT chest in 3 months, past smoker  Severe constipation -CT abdomen/pelvis findings noted, placed on Senokot-S, MiraLAX daily  Generalized debility, deconditioning -PT evaluation recommended today recommended home health, 24-hour supervision, 3 n1, DME rolling walker  Severe protein calorie malnutrition Estimated body mass index is 18.48 kg/m as calculated from the following:   Height as of 10/18/19: 5\' 7"  (1.702 m).   Weight as of 10/18/19: 53.5 kg.  Code Status: Full CODE STATUS DVT Prophylaxis:  Lovenox  Family Communication: Discussed all imaging results,  lab results, explained to the patient   Disposition Plan:     Status is: Inpatient  Remains inpatient appropriate because:Inpatient level of care appropriate due to severity of illness   Dispo: The patient is from: Home              Anticipated d/c is to: Home              Anticipated d/c date is: 2 days              Patient  currently is not medically stable to d/c.  having hypoglycemia, GI work-up      Time Spent in minutes 40 minutes Procedures:  None  Consultants:   Gastroenterology  Antimicrobials:   Anti-infectives (From admission, onward)   None         Medications  Scheduled Meds: . aspirin EC  81 mg Oral Daily  . cloBAZam  5 mg Oral BID  . enoxaparin (LOVENOX) injection  40 mg Subcutaneous Q24H  . insulin aspart  0-6 Units Subcutaneous TID WC  . insulin aspart protamine- aspart  15 Units Subcutaneous BID WC  . levETIRAcetam  1,500 mg Oral BID  . levothyroxine  150 mcg Oral Q0600  . liothyronine  5 mcg Oral Daily  . lisinopril  20 mg Oral Daily  . polyethylene glycol  17 g Oral Daily  . polyethylene glycol-electrolytes  4,000 mL Oral Once  . rosuvastatin  20 mg Oral QHS  . senna-docusate  1 tablet Oral BID  . sodium chloride flush  3 mL Intravenous Once   Continuous Infusions: PRN Meds:.acetaminophen **OR** acetaminophen, hydrALAZINE, LORazepam, ondansetron (ZOFRAN) IV      Subjective:   Andrea [name] was seen and examined today. -Multiple episodes of hypoglycemia overnight and this morning  Objective:   Vitals:   10/27/19 1547 10/27/19 1927 10/27/19 2346 10/28/19 0357  BP: 124/78 133/74 129/75 140/82  Pulse: (!) 108 96 (!) 105 94  Resp: 18 18 16 14   Temp: 98.9 F (37.2 C) 97.9 F (36.6 C) 99.6 F (37.6 C) 98.7 F (37.1 C)  TempSrc: Oral Oral Oral Oral  SpO2: 99% 100% 100% 100%    Intake/Output Summary (Last 24 hours) at 10/28/2019 6073 Last data filed at 10/27/2019 2100 Gross per 24 hour  Intake 240 ml  Output --  Net 240 ml     Wt Readings from Last 3 Encounters:  10/18/19 53.5 kg  10/08/19 49.4 kg  08/16/19 66.7 kg     Exam Gen: [place]ck[place] ill-appearing female, sitting up in bed, awake alert oriented to self and place only partly to time, cognitive deficits noted HEENT: No JVD Lungs: Clear, decreased at the bases  CVS: S1-S2, regular rate  rhythm Abdomen: Soft, nontender, bowel sounds present Extremities: No edema Skin no rash Psych flat affect    Data Reviewed:  I have personally reviewed following labs and imaging studies  Micro Results Recent Results (from the past 240 hour(s))  SARS Coronavirus 2 by RT PCR (hospital order, performed in [facility] hospital lab) Nasopharyngeal Nasopharyngeal Swab     Status: None   Collection Time: 10/19/19  9:30 PM   Specimen: Nasopharyngeal Swab  Result Value Ref Range Status   SARS Coronavirus 2 NEGATIVE NEGATIVE Final    Comment: (NOTE) SARS-CoV-2 target nucleic acids are NOT DETECTED.  The SARS-CoV-2 RNA is generally detectable in upper and lower respiratory specimens during the acute phase of infection. The lowest concentration of SARS-CoV-2 viral copies this assay can detect  is 250 copies / mL. A negative result does not preclude SARS-CoV-2 infection and should not be used as the sole basis for treatment or other patient management decisions.  A negative result may occur with improper specimen collection / handling, submission of specimen other than nasopharyngeal swab, presence of viral mutation(s) within the areas targeted by this assay, and inadequate number of viral copies (<250 copies / mL). A negative result must be combined with clinical observations, patient history, and epidemiological information.  Fact Sheet for Patients:   StrictlyIdeas.no  Fact Sheet for Healthcare Providers: BankingDealers.co.za  This test is not yet approved or  cleared by the [place] FDA and has been authorized for detection and/or diagnosis of SARS-CoV-2 by FDA under an Emergency Use Authorization (EUA).  This EUA will remain in effect (meaning this test can be used) for the duration of the COVID-19 declaration under Section 564(b)(1) of the Act, 21 U.S.C. section 360bbb-3(b)(1), unless the authorization is terminated or revoked  sooner.  Performed at [facility], [place] [address], [place], [place] [address]   Stool culture (children & immunocomp patients)     Status: None   Collection Time: 10/21/19  2:34 PM   Specimen: Stool  Result Value Ref Range Status   Salmonella/Shigella Screen Final report  Final   Campylobacter Culture Final report  Final   E coli, Shiga toxin Assay Negative Negative Final    Comment: (NOTE) Performed At: [facility] [address] [place], [place] [id number] Rush Farmer MD [id number]   OVA + PARASITE EXAM     Status: None   Collection Time: 10/21/19  2:34 PM   Specimen: Stool  Result Value Ref Range Status   OVA + PARASITE EXAM Final report  Final    Comment: (NOTE) These results were obtained using wet preparation(s) and trichrome stained smear. This test does not include testing for Cryptosporidium parvum, Cyclospora, or Microsporidia. Performed At: [place], [place] [id number] Caprice Red MD [id number]    Source of Sample STOOL  Final    Comment: Performed at [facility], [place] [address], [place], [place] [address]  STOOL CULTURE REFLEX - RSASHR     Status: None   Collection Time: 10/21/19  2:34 PM  Result Value Ref Range Status   Stool Culture result 1 (RSASHR) Comment  Final    Comment: (NOTE) No Salmonella or Shigella recovered. Performed At: [facility] [address] [place], [place] [id number] Rush Farmer MD YC:[id number]   STOOL CULTURE Reflex - CMPCXR     Status: None   Collection Time: 10/21/19  2:34 PM  Result Value Ref Range Status   Stool Culture result 1 (CMPCXR) Comment  Final    Comment: (NOTE) No Campylobacter species isolated. Performed At: [facility] [place], [place] [id number] Rush Farmer MD [id number]   CSF culture with Stat gram stain     Status: None   Collection Time: 10/22/19  3:15 PM   Specimen: CSF; Cerebrospinal Fluid   Result Value Ref Range Status   Specimen Description CSF  Final   Special Requests NONE  Final   Gram Stain   Final    WBC PRESENT, PREDOMINANTLY PMN NO ORGANISMS SEEN CYTOSPIN SMEAR    Culture   Final    NO GROWTH 3 DAYS Performed at [facility], [address] [place], [place] [address]    Report Status 10/25/2019 FINAL  Final  Radiology Reports CT CHEST W CONTRAST  Result Date: 10/23/2019 CLINICAL DATA:  Seizure, abnormal CT abdomen with basilar lung changes EXAM: CT CHEST WITH CONTRAST TECHNIQUE: Multidetector CT imaging of the chest was performed during intravenous contrast administration. Sagittal and coronal MPR images reconstructed from axial data set. CONTRAST:  37mL OMNIPAQUE IOHEXOL 300 MG/ML  SOLN IV COMPARISON:  CT abdomen and pelvis 10/21/2019 FINDINGS: Cardiovascular: Atherosclerotic calcification aorta, proximal great vessels and coronary arteries. Aorta normal caliber. Heart normal size. No pericardial effusion. Mediastinum/Nodes: Esophagus unremarkable. Base of cervical region normal appearance. No thoracic adenopathy. Lungs/Pleura: Linear areas of atelectasis versus scarring scattered in both lungs. Patchy areas of peribronchovascular density are seen in both lungs, some associated with minimal bronchiectasis, favor post inflammatory. Central peribronchial thickening present. No segmental consolidation, pleural effusion or pneumothorax. 3 mm LEFT upper lobe nodule image 30. No additional pulmonary mass/nodule. Upper Abdomen: Stomach distended by food debris. Remaining upper abdomen unremarkable Musculoskeletal: No osseous abnormalities. Scattered chest wall and axillary soft tissue edema, nonspecific. IMPRESSION: Scattered areas of atelectasis versus scarring in both lungs with few scattered areas of peribronchovascular opacity bilaterally, a few of which are associated with minimal bronchiectasis, favor postinflammatory scarring. Single 3 mm nonspecific LEFT upper  lobe nodule. Short-term follow-up CT recommended in 3 months to reassess the areas of suspected scarring to ensure stability. Aortic Atherosclerosis (ICD10-I70.0). Electronically Signed   By: Lavonia Dana M.D.   On: 10/23/2019 13:00   MR BRAIN WO CONTRAST  Result Date: 10/19/2019 CLINICAL DATA:  Stroke follow-up. Left-sided weakness. Hypoglycemia. Witnessed seizures by EMS. Clinically suspected Todd's paralysis. EXAM: MRI HEAD WITHOUT CONTRAST TECHNIQUE: Multiplanar, multiecho pulse sequences of the brain and surrounding structures were obtained without intravenous contrast. COMPARISON:  Head CT 10/19/2019 FINDINGS: The study is mildly motion degraded. Brain: There is mild restricted diffusion involving cortex in the medial right frontal lobe, right insula, and likely right hippocampus which does not conform to a vascular territory and is without associated edema. No intracranial hemorrhage, mass, midline shift, or extra-axial fluid collection is identified. No significant white matter disease is seen for age. The ventricles and sulci are normal. Vascular: Major intracranial vascular flow voids are preserved. Skull and upper cervical spine: Unremarkable bone marrow signal para Sinuses/Orbits: Bilateral cataract extraction. Small mucous retention cysts in the maxillary sinuses. Clear mastoid air cells. Other: None. IMPRESSION: Mild restricted diffusion in the medial right frontal lobe, right insula, and likely right hippocampus most consistent with recent seizure activity. Electronically Signed   By: Logan Bores M.D.   On: 10/19/2019 16:48   MR BRAIN W CONTRAST  Result Date: 10/20/2019 CLINICAL DATA:  Seizure.  Abnormal MRI. EXAM: MRI HEAD WITH CONTRAST TECHNIQUE: Multiplanar, multiecho pulse sequences of the brain and surrounding structures were obtained with intravenous contrast. CONTRAST:  39mL GADAVIST GADOBUTROL 1 MMOL/ML IV SOLN COMPARISON:  MRI head without contrast 05/21/2019 FINDINGS: Brain: Review of  the MRI from yesterday reveals restricted diffusion in the right medial frontal cortex, also in the right insula and right hippocampus. Note that the lesions are best seen on diffusion-weighted imaging with no significant associated FLAIR cortical edema. These findings are unilateral. No abnormal enhancement on today's study. Ventricle size is normal. Diffusion-weighted imaging was not repeated today IMPRESSION: Normal enhancement of the brain. Based on the findings yesterday, differential includes seizure related cortical activity, autoimmune related encephalitis, and less likely viral encephalitis. Consider lumbar puncture. Short-term follow-up MRI with contrast may be helpful. Electronically Signed   By: Franchot Gallo M.D.  On: 10/20/2019 16:40   CT ABDOMEN PELVIS W CONTRAST  Result Date: 10/22/2019 CLINICAL DATA:  Unintended weight loss. EXAM: CT ABDOMEN AND PELVIS WITH CONTRAST TECHNIQUE: Multidetector CT imaging of the abdomen and pelvis was performed using the standard protocol following bolus administration of intravenous contrast. CONTRAST:  156mL OMNIPAQUE IOHEXOL 300 MG/ML  SOLN COMPARISON:  None. FINDINGS: Lower chest: Majority of the chest is included in the field of view. Scattered linear and bandlike opacities throughout both lungs are typical of scarring. There is a subpleural 5 mm left upper lobe pulmonary nodule, series 6, image 26. Scattered peribronchovascular irregular opacities in the right lower lobe series 6, image 53, right middle lobe, series 6, image 39, left lower lobe, same image, left upper lobe series 6, image 29, and right mid lung same image. No dominant pulmonary mass. Hepatobiliary: Scattered subcentimeter hypodensities in the liver are too small to characterize but likely small cysts or biliary hamartomas. No evidence of suspicious liver lesion. Gallbladder physiologically distended, no calcified stone. No biliary dilatation. Pancreas: No ductal dilatation or inflammation.  Spleen: Normal in size without focal abnormality. Adrenals/Urinary Tract: Normal adrenal glands. There is right hydroureteronephrosis. The ureter is dilated to pelvic brim. There is mild left hydronephrosis and proximal hydroureter, with similar ureteral dilatation. Diminished excretion on delayed phase imaging, right greater than left. No evidence of focal renal mass. Urinary bladder is displaced anteriorly by large stool ball in the rectum. Stomach/Bowel: Large inspissated stool ball distends the rectum with rectal distention of 8.6 cm. Associated rectal wall thickening and perirectal edema. No pneumatosis. Approximately 6.3 cm length area of sigmoid colonic wall thickening with pericolonic edema just proximal to the large volume of rectosigmoid stool. Large volume of stool throughout the remainder of the colon, there is significant sigmoid colonic redundancy. No additional areas of colonic wall thickening. The appendix is not confidently visualized. The stomach is unremarkable. No small bowel obstruction or abnormal distention. No definite small bowel inflammation. Vascular/Lymphatic: Moderate aorto bi-iliac atherosclerosis, advanced for age. No aortic aneurysm. Patent portal vein. There is no adenopathy in the abdomen or pelvis. Reproductive: Calcified uterine fibroids. The uterus is displaced into the right pelvis and anteriorly by large rectosigmoid stool burden. No evidence of adnexal mass. Other: Perirectal and distal pericolonic edema with small amount of presacral fluid. No other ascites. There is no free air or perforation. Mild generalized body wall edema. Musculoskeletal: Mild L1 superior endplate compression fracture, age indeterminate. No evidence of focal bone lesion. IMPRESSION: 1. Large inspissated stool ball distends the rectum with rectal distention of 8.6 cm. Associated rectal wall thickening and perirectal edema, suspicious for fecal impaction. The stool distended rectum causes mass effect on  the urinary bladder, uterus, and distal ureters with bilateral hydroureteronephrosis, right greater than left. 2. Approximately 6.3 cm length area of sigmoid colonic wall thickening with pericolonic edema just proximal to the large volume of rectosigmoid stool. This may represent focal colitis, including stercoral colitis versus colonic neoplasm. 3. Large volume of stool throughout the remainder of the colon. There is significant sigmoid colonic redundancy. 4. Scattered peribronchovascular irregular opacities in the lung bases, likely infectious or inflammatory. There is a 5 mm subpleural left upper lobe pulmonary nodule. No follow-up needed if patient is low-risk (and has no known or suspected primary neoplasm). Non-contrast chest CT can be considered in 12 months if patient is high-risk. This recommendation follows the consensus statement: Guidelines for Management of Incidental Pulmonary Nodules Detected on CT Images: From the Fleischner Society 2017;  Radiology 2017; 284:228-243. 5. Mild L1 superior endplate compression fracture, age indeterminate. Aortic Atherosclerosis (ICD10-I70.0). Electronically Signed   By: Keith Rake M.D.   On: 10/22/2019 00:00   DG CHEST PORT 1 VIEW  Result Date: 10/19/2019 CLINICAL DATA:  59 year old female with left-sided weakness. Concern for stroke. EXAM: PORTABLE CHEST 1 VIEW COMPARISON:  Chest radiograph dated 01/14/2005. FINDINGS: No focal consolidation, pleural effusion or pneumothorax. The cardiac silhouette is within limits. No acute osseous pathology. IMPRESSION: No active disease. Electronically Signed   By: Anner Crete M.D.   On: 10/19/2019 19:28   EEG adult  Result Date: 10/19/2019 Lora Havens, MD     10/19/2019  8:23 PM Patient Name: Azya Barbero MRN: [id number] Epilepsy Attending: Lora Havens Referring Physician/Provider: Dr Amie Portland Date: 10/19/2019 Duration: 30.57 mins Patient history: 59yo F who presented with ams and seizure. EEG to  evaluate for seizure, Level of alertness: Awake AEDs during EEG study:  keppra Technical aspects: This EEG study was done with scalp electrodes positioned according to the 10-20 International system of electrode placement. Electrical activity was acquired at a sampling rate of 500Hz  and reviewed with a high frequency filter of 70Hz  and a low frequency filter of 1Hz . EEG data were recorded continuously and digitally stored. Description: No posterior dominant rhythm was seen. EEG showed generalize, maximal bifrontal periodic epileptiform discharges with triphasic morphology at 0.25-0.5Hz . EEG also showed continuous generalized 8-9Hz  alpha activity.  Hyperventilation and photic stimulation were not performed.   ABNORMALITY -Periodic epileptiform discharges with triphasic morphology, generalized, maximal bifrontal IMPRESSION: This study showed evidence of generalized epileptogenicity with epileptiform discharges at 0.25-0.5hz  which is on the ictal-interictal continuum. No definite seizures were seen throughout the recording. Dr Cheral Marker was notified. Priyanka Barbra Sarks   Overnight EEG with video  Result Date: 10/20/2019 Lora Havens, MD     10/21/2019  1:03 PM Patient Name: Daisha Filosa MRN: [id number] Epilepsy Attending: Lora Havens Referring Physician/Provider: Dr Milus Banister Duration: 10/19/2019 2027 to 10/20/2019 2027  Patient history: 59yo F who presented with ams and seizure. EEG to evaluate for seizure,  Level of alertness: Awake  AEDs during EEG study:  keppra  Technical aspects: This EEG study was done with scalp electrodes positioned according to the 10-20 International system of electrode placement. Electrical activity was acquired at a sampling rate of 500Hz  and reviewed with a high frequency filter of 70Hz  and a low frequency filter of 1Hz . EEG data were recorded continuously and digitally stored.  Description: No posterior dominant rhythm was seen. EEG initially showed generalized, maximal  bifrontal periodic epileptiform discharges with triphasic morphology at 0.25-0.5Hz . Gradually, eeg showed right frontocentral periodic epileptiform discharges at 1Hz . EEG also showed continuous generalized 8-9Hz  alpha activity. Event button was pressed multiple times during which patient was noted to have left side face twitching, lasting about 30 seconds to one minute. Concomitant eeg showed rhythmic sharply contoured 5-9h generalized theta-alpha activity which evolved into 3-5Hz  theta-delta activity.  ABNORMALITY - Seizure, generalized - Periodic epileptiform discharges with triphasic morphology, generalized, maximal bifrontal - Continuous slow, generalized  IMPRESSION: This study showed multiple seizures with generalized onset during which patient was noted to have left face twitching, lasting 30 seconds to one minute. Additionally, there are epileptiform discharges at 0.25-0.5hz  which is on the ictal-interictal continuum as well as moderate diffuse encephalopathy, non specific to etiology.  Lora Havens   CT HEAD CODE STROKE WO CONTRAST  Result Date: 10/19/2019 CLINICAL DATA:  Code stroke.  Left-sided  weakness. EXAM: CT HEAD WITHOUT CONTRAST TECHNIQUE: Contiguous axial images were obtained from the base of the skull through the vertex without intravenous contrast. COMPARISON:  None. FINDINGS: The study is mildly motion degraded. Brain: Within limitations of motion, no acute infarct, intracranial hemorrhage, mass, midline shift, or extra-axial fluid collection is identified. The ventricles and sulci are normal. Vascular: Calcified atherosclerosis at the skull base. No hyperdense vessel. Skull: No fracture or suspicious osseous lesion. Sinuses/Orbits: Partially visualized mild mucosal thickening in the left maxillary sinus. Bilateral cataract extraction. Other: None. ASPECTS [facility] Stroke Program Early CT Score) - Ganglionic level infarction (caudate, lentiform nuclei, internal capsule, insula,  M1-M3 cortex): 7 - Supraganglionic infarction (M4-M6 cortex): 3 Total score (0-10 with 10 being normal): 10 IMPRESSION: 1. No evidence of acute intracranial abnormality within limitations of mild motion. 2. ASPECTS is 10. These results were communicated to Dr. Rory Percy at 3:50 pm on 10/19/2019 by text page via the [facility] messaging system. Electronically Signed   By: Logan Bores M.D.   On: 10/19/2019 15:50    Lab Data:  CBC: Recent Labs  Lab 10/25/19 0354 10/27/19 0240  WBC 8.6 8.5  NEUTROABS 7.1 6.5  HGB 9.0* 9.6*  HCT 27.0* 29.4*  MCV 86.0 87.0  PLT 445* 563*   Basic Metabolic Panel: Recent Labs  Lab 10/22/19 0845 10/25/19 0354 10/27/19 0240 10/28/19 0343  NA 140 141 141 139  K 3.6 3.2* 3.5 3.1*  CL 110 106 105 105  CO2 18* 25 22 26   GLUCOSE 153* 63* 153* 56*  BUN <5* 10 12 13   CREATININE 0.58 0.67 0.79 0.59  CALCIUM 8.1* 8.7* 8.7* 8.4*   GFR: Estimated Creatinine Clearance: 63.9 mL/min (by C-G formula based on SCr of 0.59 mg/dL). Liver Function Tests: No results for input(s): AST, ALT, ALKPHOS, BILITOT, PROT, ALBUMIN in the last 168 hours. No results for input(s): LIPASE, AMYLASE in the last 168 hours. No results for input(s): AMMONIA in the last 168 hours. Coagulation Profile: No results for input(s): INR, PROTIME in the last 168 hours. Cardiac Enzymes: No results for input(s): CKTOTAL, CKMB, CKMBINDEX, TROPONINI in the last 168 hours. BNP (last 3 results) No results for input(s): PROBNP in the last 8760 hours. HbA1C: No results for input(s): HGBA1C in the last 72 hours. CBG: Recent Labs  Lab 10/28/19 0214 10/28/19 0356 10/28/19 0418 10/28/19 0454 10/28/19 0614  GLUCAP 100* 48* 51* 94 88   Lipid Profile: No results for input(s): CHOL, HDL, LDLCALC, TRIG, CHOLHDL, LDLDIRECT in the last 72 hours. Thyroid Function Tests: No results for input(s): TSH, T4TOTAL, FREET4, T3FREE, THYROIDAB in the last 72 hours. Anemia Panel: No results for input(s): VITAMINB12,  FOLATE, FERRITIN, TIBC, IRON, RETICCTPCT in the last 72 hours. Urine analysis:    Component Value Date/Time   COLORURINE COLORLESS (A) 10/08/2019 1848   APPEARANCEUR CLEAR (A) 10/08/2019 1848   LABSPEC 1.017 10/08/2019 1848   PHURINE 6.0 10/08/2019 1848   GLUCOSEU >=500 (A) 10/08/2019 1848   HGBUR NEGATIVE 10/08/2019 1848   BILIRUBINUR NEGATIVE 10/08/2019 1848   KETONESUR NEGATIVE 10/08/2019 1848   PROTEINUR NEGATIVE 10/08/2019 1848   NITRITE NEGATIVE 10/08/2019 1848   LEUKOCYTESUR NEGATIVE 10/08/2019 1848     Domenic Polite M.D. [facility] 10/28/2019, 9:55 AM

## 2019-10-28 NOTE — Progress Notes (Signed)
[facility] Gastroenterology Progress Note  Andrea [name] 59 y.o. [birth year]  CC:  Abnormal CT, concern for colonic neoplasm  Subjective: Patient is somnolent today and unable to carry on a conversation without falling asleep.  Denies any abdominal pain.  ROS : Limited by lethargy.  Objective: Vital signs in last 24 hours: Vitals:   10/27/19 2346 10/28/19 0357  BP: 129/75 140/82  Pulse: (!) 105 94  Resp: 16 14  Temp: 99.6 F (37.6 C) 98.7 F (37.1 C)  SpO2: 100% 100%    Physical Exam:  General:   Somnolent, no acute distress  Head:  Normocephalic, without obvious abnormality, atraumatic  Eyes:   Anicteric sclera, EOMs intact  Lungs:   Clear to auscultation bilaterally, respirations unlabored  Heart:  Regular rate and rhythm, S1, S2 normal  Abdomen:   Soft, non-tender, nondistended, normoactive bowel sounds,  no guarding or peritoneal signs  Extremities: Extremities normal, atraumatic, no  edema  Pulses: 2+ and symmetric    Lab Results: Recent Labs    10/27/19 0240 10/28/19 0343  NA 141 139  K 3.5 3.1*  CL 105 105  CO2 22 26  GLUCOSE 153* 56*  BUN 12 13  CREATININE 0.79 0.59  CALCIUM 8.7* 8.4*   No results for input(s): AST, ALT, ALKPHOS, BILITOT, PROT, ALBUMIN in the last 72 hours. Recent Labs    10/27/19 0240  WBC 8.5  NEUTROABS 6.5  HGB 9.6*  HCT 29.4*  MCV 87.0  PLT 491*   No results for input(s): LABPROT, INR in the last 72 hours.   Impression: Concern for colonic neoplasm based on CT findings and recent constipation and unintentional weight loss of 70 lbs. -CT 6/22 showed sigmoid wall thickening and pericolonic edema with large volume of stool: stercoral colitis vs. colonic neoplasm. -CEA normal (3.3) and Ca 19-9 normal (<2) -Mild normocytic anemia: Hgb 9.6, stable as of 8/52  DM type 1 complicated by hypoglycemia  Focal seizures, new-onset, now resolved.  Autoimmune and paraneoplastic panel was sent to [facility] on 10/22/2019,results  pending.  Plan: Clear liquid diet with Nulytely prep. Colonoscopy tomorrow 6/30 with Dr. Paulita Fujita.    I have concerns that patient's somnolence will limit prep intake.  Discussed with RN.  If patient unable to complete prep, colonoscopy may need to be postponed.   [facility] will follow.  Salley Slaughter PA-C 10/28/2019, 11:42 AM  Contact #  [phone]

## 2019-10-28 NOTE — Progress Notes (Signed)
PT Cancellation Note  Patient Details Name: Andrea [name] MRN: [id number] DOB: [birth year]   Cancelled Treatment:    Reason Eval/Treat Not Completed: Fatigue/lethargy limiting ability to participate. Pt is in bed with minimal conversation and then closes her eyes.  Re-attempt as time and pt allow.   Ramond Dial 10/28/2019, 10:53 AM   Mee Hives, PT MS Acute Rehab Dept. Number: [place]

## 2019-10-28 NOTE — Progress Notes (Signed)
PT Cancellation Note  Patient Details Name: Andrea [name] MRN: [id number] DOB: 12-30-1960   Cancelled Treatment:    Reason Eval/Treat Not Completed: Fatigue/lethargy limiting ability to participate. Checked back on pt this afternoon with continued refusal to participate. Pt still laying in bed with lights out and minimally conversant. Offered change in position, bathroom, and recliner with pt declining all. Will continue to follow.    Thelma Comp 10/28/2019, 1:35 PM   Rolinda Roan, PT, DPT Acute Rehabilitation Services Pager: [phone] Office: [phone]

## 2019-10-28 NOTE — Progress Notes (Signed)
Patient remains somnolent and has not been able to drink much if any of her colonoscopy prep. Because of this, we do not think it will be ideal for patient to undergo colonoscopy tomorrow. We will postpone the colonoscopy for now and reassess the patient's clinical status tomorrow.  Salley Slaughter [facility] Gastroenterology. [phone]

## 2019-10-28 NOTE — Progress Notes (Signed)
Hypoglycemic Event  CBG: 48 Treatment: 12oz of orange juice given   Symptoms: none Follow-up CBG: Time: 4:18 CBG Result:51; 80ml of dextrose iv given at this point.  Possible Reasons for Event:unknown Comments/MD notified:    Kasidy Gianino L

## 2019-10-29 ENCOUNTER — Encounter (HOSPITAL_COMMUNITY)
Disposition: A | Discharge status: Skilled Nursing Facility | Payer: Self-pay | Source: Home / Self Care | Attending: Internal Medicine

## 2019-10-29 LAB — GLUCOSE, CAPILLARY
Glucose-Capillary: 61 mg/dL — ABNORMAL LOW (ref 70–99)
Glucose-Capillary: 59 mg/dL — ABNORMAL LOW (ref 70–99)
Glucose-Capillary: 60 mg/dL — ABNORMAL LOW (ref 70–99)
Glucose-Capillary: 71 mg/dL (ref 70–99)
Glucose-Capillary: 78 mg/dL (ref 70–99)
Glucose-Capillary: 125 mg/dL — ABNORMAL HIGH (ref 70–99)
Glucose-Capillary: 180 mg/dL — ABNORMAL HIGH (ref 70–99)
Glucose-Capillary: 191 mg/dL — ABNORMAL HIGH (ref 70–99)

## 2019-10-29 LAB — BASIC METABOLIC PANEL
Anion gap: 9 (ref 5–15)
BUN: 7 mg/dL (ref 6–20)
CO2: 28 mmol/L (ref 22–32)
Calcium: 8.3 mg/dL — ABNORMAL LOW (ref 8.9–10.3)
Chloride: 104 mmol/L (ref 98–111)
Creatinine, Ser: 0.59 mg/dL (ref 0.44–1.00)
GFR calc Af Amer: >60 mL/min (ref >60)
GFR calc non Af Amer: >60 mL/min (ref >60)
Glucose, Bld: 75 mg/dL (ref 70–99)
Potassium: 3.6 mmol/L (ref 3.5–5.1)
Sodium: 141 mmol/L (ref 135–145)

## 2019-10-29 LAB — CBC
HCT: 30 % — ABNORMAL LOW (ref 36.0–46.0)
Hemoglobin: 9.7 g/dL — ABNORMAL LOW (ref 12.0–15.0)
MCH: 28.6 pg (ref 26.0–34.0)
MCHC: 32.3 g/dL (ref 30.0–36.0)
MCV: 88.5 fL (ref 80.0–100.0)
Platelets: 472 10*3/uL — ABNORMAL HIGH (ref 150–400)
RBC: 3.39 MIL/uL — ABNORMAL LOW (ref 3.87–5.11)
RDW: 14.6 % (ref 11.5–15.5)
WBC: 8.6 10*3/uL (ref 4.0–10.5)
nRBC: 0.5 % — ABNORMAL HIGH (ref 0.0–0.2)

## 2019-10-29 SURGERY — COLONOSCOPY WITH PROPOFOL
Anesthesia: Monitor Anesthesia Care

## 2019-10-29 MED ORDER — INSULIN ASPART 100 UNIT/ML ~~LOC~~ SOLN
0.0000 [IU] | SUBCUTANEOUS | Status: DC
  Administered 2019-10-29 – 2019-10-30 (×2): 1 [IU] via SUBCUTANEOUS
  Administered 2019-10-30: 2 [IU] via SUBCUTANEOUS
  Administered 2019-10-31: 1 [IU] via SUBCUTANEOUS
  Administered 2019-10-31: 2 [IU] via SUBCUTANEOUS
  Administered 2019-10-31: 1 [IU] via SUBCUTANEOUS

## 2019-10-29 MED ORDER — SODIUM CHLORIDE 0.9 % IV SOLN: INTRAVENOUS | Status: DC

## 2019-10-29 MED ORDER — DEXTROSE 50 % IV SOLN
INTRAVENOUS | Status: AC
  Filled 2019-10-29: qty 50

## 2019-10-29 NOTE — Progress Notes (Signed)
Inpatient Diabetes Program Recommendations  AACE/ADA: New Consensus Statement on Inpatient Glycemic Control (2015)  Target Ranges:  Prepandial:   less than 140 mg/dL      Peak postprandial:   less than 180 mg/dL (1-2 hours)      Critically ill patients:  140 - 180 mg/dL   Lab Results  Component Value Date   GLUCAP 78 10/29/2019   HGBA1C 12.4 (H) 10/21/2019    Review of Glycemic Control Results for [name], Andrea [name] (MRN [id number]) as of 10/29/2019 12:58  Ref. Range 10/28/2019 04:54 10/28/2019 06:14 10/28/2019 12:03 10/28/2019 15:35 10/28/2019 21:02 10/29/2019 06:18 10/29/2019 06:31 10/29/2019 06:42 10/29/2019 06:56 10/29/2019 10:43  Glucose-Capillary Latest Ref Range: 70 - 99 mg/dL 94 88 169 (H) 117 (H) 130 (H) 61 (L) 59 (L) 60 (L) 71 78  Diabetes history: DM Type 1 Outpatient Diabetes medications:  Novolin 70/30 80 units in the AM and 85 units in the PM Current orders for Inpatient glycemic control:  Novolog 0-6 units tid with meals Novolog 70/30 15 units bid Inpatient Diabetes Program Recommendations:    Patient continues to have low blood sugars.  She has not received any 70/30 insulin since 10/27/19.   May consider holding 70/30 for now due to hypoglycemia. Change Novolog (0-6 units) to q 4 hours.   Thanks,  Adah Perl, RN, BC-ADM Inpatient Diabetes Coordinator Pager [phone] (8a-5p)

## 2019-10-29 NOTE — Progress Notes (Signed)
CBG 60, asymptomatic. M. Sharlet Salina updated.

## 2019-10-29 NOTE — Progress Notes (Signed)
[facility] Gastroenterology Progress Note  Andrea [name] 59 y.o. 07-Apr-1961  CC:  Abnormal CT, concern for colonic neoplasm  Subjective: Patient is somnolent today and unable to carry on a conversation without falling asleep.  Denies any abdominal pain.  ROS : Limited by lethargy.  Objective: Vital signs in last 24 hours: Vitals:   10/29/19 0542 10/29/19 0924  BP:  122/69  Pulse:  95  Resp:  14  Temp: 98.9 F (37.2 C) 98.1 F (36.7 C)  SpO2:  100%    Physical Exam:  General:   Somnolent, no acute distress  Head:  Normocephalic, without obvious abnormality, atraumatic  Eyes:   Anicteric sclera, EOMs intact  Lungs:   Clear to auscultation bilaterally, respirations unlabored  Heart:  Regular rate and rhythm, S1, S2 normal  Abdomen:   Soft, non-tender, nondistended, normoactive bowel sounds,  no guarding or peritoneal signs  Extremities: Extremities normal, atraumatic, no  edema  Pulses: 2+ and symmetric    Lab Results: Recent Labs    10/28/19 0343 10/29/19 0737  NA 139 141  K 3.1* 3.6  CL 105 104  CO2 26 28  GLUCOSE 56* 75  BUN 13 7  CREATININE 0.59 0.59  CALCIUM 8.4* 8.3*   No results for input(s): AST, ALT, ALKPHOS, BILITOT, PROT, ALBUMIN in the last 72 hours. Recent Labs    10/27/19 0240 10/29/19 0737  WBC 8.5 8.6  NEUTROABS 6.5  --   HGB 9.6* 9.7*  HCT 29.4* 30.0*  MCV 87.0 88.5  PLT 491* 472*   No results for input(s): LABPROT, INR in the last 72 hours.   Impression: Concern for colonic neoplasm based on CT findings and recent constipation and unintentional weight loss of 70 lbs. -CT 6/22 showed sigmoid wall thickening and pericolonic edema with large volume of stool: stercoral colitis vs. colonic neoplasm. -CEA normal (3.3) and Ca 19-9 normal (<2) -Mild normocytic anemia: Hgb 9.7, stable  DM type 1 complicated by hypoglycemia  Focal seizures, new-onset, now resolved.   Plan: Patient's somnolence continues to limit prep intake.    Please call  us back when patient is awake enough to tolerate prep and colonoscopy.  Salley Slaughter PA-C 10/29/2019, 11:24 AM  Contact #  [phone]

## 2019-10-29 NOTE — Progress Notes (Signed)
Physical Therapy Treatment Patient Details Name: Andrea [name] MRN: [id number] DOB: 10-05-1960 Today's Date: 10/29/2019    History of Present Illness 59 y.o. female with past medical history significant for diabetes insulin-dependent, hyperlipidemia, hypothyroidism, who was just evaluated at [facility], ER on 6/19 for hypoglycemia of 24, patient was initially nonverbal with generalized weakness at that time.  Hypoglycemia was corrected and she was discharged home.  Today family called EMS because patient's blood sugar was again low at 30.  Patient was also noted to have  left-sided weakness.  Patient was last seen normal at 2 AM.  When EMS was on the scene patient had 2 focal seizures.  Patient received IV Versed and glucose.  On arrival to the ED patient had still mild left-sided weakness and dysarthria.    PT Comments    Pt progressing slowly towards physical therapy goals. Continues to be lethargic this session but willing to work with therapy. She seemed unaware of bowel incontinence and was inconsistent with her answers when PT was questioning whether she was finished or needed to sit on the [facility] to finish her BM. NT present to assist with clean up. Pt demonstrated fair static balance without UE support but required at least 1 UE support with dynamic standing balance activity. Will continue to follow and progress as able per POC.     Follow Up Recommendations  Home health PT;Supervision/Assistance - 24 hour     Equipment Recommendations  3in1 (PT);Rolling walker with 5" wheels    Recommendations for Other Services       Precautions / Restrictions Precautions Precautions: Fall Precaution Comments: seizure Restrictions Weight Bearing Restrictions: No    Mobility  Bed Mobility Overal bed mobility: Needs Assistance Bed Mobility: Supine to Sit;Sit to Supine     Supine to sit: Supervision Sit to supine: Supervision   General bed mobility comments: No assist required. Pt  transitioned to and from EOB with increased time and use of rail.   Transfers Overall transfer level: Needs assistance Equipment used: Rolling walker (2 wheeled);None Transfers: Sit to/from [employer] to Stand: Min guard Stand pivot transfers: Min guard       General transfer comment: Pt was able to power-up to full stand without the RW. She initially used RW to SPT to the [facility] and then was able to take pivotal steps back to the bed without RW [facility])  Ambulation/Gait             General Gait Details: Not tested this session. Focus was toileting and getting pt/linen cleaned up and changed.    Stairs             Wheelchair Mobility    Modified Rankin (Stroke Patients Only) Modified Rankin (Stroke Patients Only) Pre-Morbid Rankin Score: No symptoms Modified Rankin: Moderately severe disability     Balance Overall balance assessment: Needs assistance Sitting-balance support: Single extremity supported;Feet supported Sitting balance-Leahy Scale: Fair Sitting balance - Comments: close supervision    Standing balance support: Single extremity supported Standing balance-Leahy Scale: Fair Standing balance comment: min guard for stability standing during pericare                            Cognition Arousal/Alertness: Lethargic Behavior During Therapy: Flat affect Overall Cognitive Status: No family/caregiver present to determine baseline cognitive functioning  General Comments: Slow processing      Exercises      General Comments        Pertinent Vitals/Pain Pain Assessment: No/denies pain    Home Living                      Prior Function            PT Goals (current goals can now be found in the care plan section) Acute Rehab PT Goals Patient Stated Goal: None stated this session PT Goal Formulation: With patient Time For Goal Achievement: 11/05/19 Potential  to Achieve Goals: Good Progress towards PT goals: Progressing toward goals    Frequency    Min 3X/week      PT Plan Current plan remains appropriate    Co-evaluation              AM-PAC PT "6 Clicks" Mobility   Outcome Measure  Help needed turning from your back to your side while in a flat bed without using bedrails?: None Help needed moving from lying on your back to sitting on the side of a flat bed without using bedrails?: None Help needed moving to and from a bed to a chair (including a wheelchair)?: A Little Help needed standing up from a chair using your arms (e.g., wheelchair or bedside chair)?: A Little Help needed to walk in hospital room?: A Little Help needed climbing 3-5 steps with a railing? : A Lot 6 Click Score: 19    End of Session Equipment Utilized During Treatment: Gait belt Activity Tolerance: Patient tolerated treatment well Patient left: in bed;with call bell/phone within reach;with bed alarm set Nurse Communication: Mobility status PT Visit Diagnosis: Unsteadiness on feet (R26.81);Other abnormalities of gait and mobility (R26.89);Muscle weakness (generalized) (M62.81);Other symptoms and signs involving the nervous system (R29.898)     Time: 2094-7096 PT Time Calculation (min) (ACUTE ONLY): 23 min  Charges:  $Therapeutic Activity: 23-37 mins                     Rolinda Roan, PT, DPT Acute Rehabilitation Services Pager: [phone] Office: [place] 10/29/2019, 3:01 PM

## 2019-10-29 NOTE — Progress Notes (Signed)
Pt not able to drink prep at a rate that will allow for colonoscopy, will conti monitor. M. Sharlet Salina updated.

## 2019-10-29 NOTE — Progress Notes (Signed)
[facility]                                                                              Patient Demographics  Andrea [name], is a 59 y.o. female, DOB - 10-09-1960, JJO:[id number]  Admit date - 10/19/2019   Admitting Physician Elmarie Shiley, MD  Outpatient Primary MD for the patient is Bright, Aaron Mose, MD  Outpatient specialists:   LOS - 9  days   Medical records reviewed and are as summarized below:    Chief Complaint  Patient presents with  . Code Stroke       Brief summary   Patient is a 59 year old female with history of insulin-dependent diabetes mellitus, hyperlipidemia, hypothyroidism who was brought to the emergency department for evaluation of hypoglycemia. She presented to the emergency department on 6/19 for similar problem and was discharged to home after correction ofhypoglycemia. When EMS arrived at home, patient had 2 focal seizures and was given IV Versed and glucose. On presentation she had mild left-sided weakness and dysarthria. She takes 70/30 insulin at home. On presentation, CT head did not show any acute abnormality but MRI findings are consistent with recent seizure activity. Neurology was consulted. Hospital course remarkable for severe constipation.she developed twitching of face muscles on 09/2319. She underwent LP by neurology suspecting for autoimmune/inflammatory disorder. Started on high-dose steroids. PT recommending SNF on DC    Assessment & Plan   Principal problem Acute metabolic encephalopathy -Likely secondary to hypoglycemia versus seizure -Currently alert and oriented, close to her baseline   Active problems Seizures -New onset, likely due to hypoglycemia however EEG also showed epileptiform discharges. -Neurology was consulted, patient started on Keppra and Clobazam -No abnormal enhancement seen on MRI, again had facial muscle twitching on 6/23. -Neurology did an LP, started on Solu-Medrol for possible  autoimmune/inflammatory disorder/paraneoplastic disorder.  Unclear etiology, CSF IgG cell count, HSV, myelin basic protein, VDRL normal.  CSF culture negative -Neurology recommended IV Solu-Medrol 1 g daily for 5 doses, completed on 6/27 -Autoimmune and paraneoplastic labs sent to [facility] -Continued on high-dose Keppra and clobazam -Neurology recommended follow-up with Dr. Delice Lesch in 6 weeks and consider repeat MRI brain   Type I diabetic with hypoglycemia episodes -Reportedly on Novolin 70/30 at home 80 units in the morning and 85 units at night however having intermittent hypoglycemia episodes while inpatient -Patient continues to be hypoglycemic, 59, 60 earlier this morning, discontinued Novolin 70/30.  Continue only sensitive sliding scale insulin -Ordered proinsulin, C-peptide   Anorexia/weight loss, early satiety, abnormal CT scan -CT abdomen pelvis showed fecal impaction, bilateral hydroureteronephrosis due to distended rectum, approximately 6.3 cm sigmoid colonic wall thickening may represent focal colitis including stercoral colitis versus colonic neoplasm, large volume of stool throughout the remainder of colon --Extensive GI work-up outpatient with [facility] (Dr [name]), had upper endoscopy on 04/09/2019 for the above complaints, which showed chronic gastritis with H. pylori.  No metaplasia or dysplasia or carcinoma. -She had a normal GE study on 04/17/2019 which showed no gastroparesis -Patient had flex sig 2016 which had shown diverticulosis with nonthrombosed external hemorrhoids, polyps, otherwise benign. GI consulted, patient has  not been completed bowel prep, colonoscopy currently on hold -CEA, CA 19-9, normal   Hypothyroidism -Follows with endocrinology, Dr. Dina Rich, continue Synthroid, Cytomel  Left upper lobe nodule, 3 mm incidental finding -Repeat CT chest in 3 months, past smoker  Severe constipation -CT abdomen/pelvis findings noted, placed on Senokot-S,  MiraLAX daily  Generalized debility, deconditioning -PT evaluation recommended today recommended home health, 24-hour supervision, 3 n1, DME rolling walker  Severe protein calorie malnutrition Estimated body mass index is 18.48 kg/m as calculated from the following:   Height as of 10/18/19: 5\' 7"  (1.702 m).   Weight as of 10/18/19: 53.5 kg.  Code Status: Full CODE STATUS DVT Prophylaxis:  Lovenox  Family Communication: Discussed all imaging results, lab results, explained to the patient   Disposition Plan:     Status is: Inpatient  Remains inpatient appropriate because:Inpatient level of care appropriate due to severity of illness   Dispo: The patient is from: Home              Anticipated d/c is to: Home              Anticipated d/c date is: 2 days              Patient currently is not medically stable to d/c.  Continues to be hypoglycemic      Time Spent in minutes 40 minutes Procedures:  None  Consultants:   Gastroenterology  Antimicrobials:   Anti-infectives (From admission, onward)   None         Medications  Scheduled Meds: . aspirin EC  81 mg Oral Daily  . cloBAZam  5 mg Oral BID  . dextrose      . enoxaparin (LOVENOX) injection  40 mg Subcutaneous Q24H  . insulin aspart  0-6 Units Subcutaneous Q4H  . levETIRAcetam  1,500 mg Oral BID  . levothyroxine  150 mcg Oral Q0600  . liothyronine  5 mcg Oral Daily  . lisinopril  20 mg Oral Daily  . polyethylene glycol  17 g Oral Daily  . rosuvastatin  20 mg Oral QHS  . senna-docusate  1 tablet Oral BID  . sodium chloride flush  3 mL Intravenous Once   Continuous Infusions: PRN Meds:.acetaminophen **OR** acetaminophen, hydrALAZINE, LORazepam, ondansetron (ZOFRAN) IV      Subjective:   [name] was seen and examined today.  Alert and awake, having hypoglycemia episodes since early morning.  Has not received any insulin in the last 24 hours.  No nausea vomiting or any diarrhea, abdominal  pain.   Objective:   Vitals:   10/29/19 0415 10/29/19 0542 10/29/19 0924 10/29/19 1154  BP: 120/79  122/69 117/65  Pulse: (!) 102  95 94  Resp: 18  14   Temp: (!) 97.3 F (36.3 C) 98.9 F (37.2 C) 98.1 F (36.7 C) 98.4 F (36.9 C)  TempSrc: Oral Oral Oral Oral  SpO2:   100% 100%    Intake/Output Summary (Last 24 hours) at 10/29/2019 1415 Last data filed at 10/29/2019 1100 Gross per 24 hour  Intake --  Output 1000 ml  Net -1000 ml     Wt Readings from Last 3 Encounters:  10/18/19 53.5 kg  10/08/19 49.4 kg  08/16/19 66.7 kg    Physical Exam  General: Sleepy but arousable and oriented.  Cardiovascular: S1 S2 clear, RRR. No pedal edema b/l  Respiratory: CTAB, no wheezing, rales or rhonchi  Gastrointestinal: Soft, nontender, nondistended, NBS  Ext: no pedal edema bilaterally  Neuro:  no new deficits  Musculoskeletal: No cyanosis, clubbing  Skin: No rashes  Psych: Sleepy but arousable and oriented   Data Reviewed:  I have personally reviewed following labs and imaging studies  Micro Results Recent Results (from the past 240 hour(s))  SARS Coronavirus 2 by RT PCR (hospital order, performed in [facility] hospital lab) Nasopharyngeal Nasopharyngeal Swab     Status: None   Collection Time: 10/19/19  9:30 PM   Specimen: Nasopharyngeal Swab  Result Value Ref Range Status   SARS Coronavirus 2 NEGATIVE NEGATIVE Final    Comment: (NOTE) SARS-CoV-2 target nucleic acids are NOT DETECTED.  The SARS-CoV-2 RNA is generally detectable in upper and lower respiratory specimens during the acute phase of infection. The lowest concentration of SARS-CoV-2 viral copies this assay can detect is 250 copies / mL. A negative result does not preclude SARS-CoV-2 infection and should not be used as the sole basis for treatment or other patient management decisions.  A negative result may occur with improper specimen collection / handling, submission of specimen other than  nasopharyngeal swab, presence of viral mutation(s) within the areas targeted by this assay, and inadequate number of viral copies (<250 copies / mL). A negative result must be combined with clinical observations, patient history, and epidemiological information.  Fact Sheet for Patients:   StrictlyIdeas.no  Fact Sheet for Healthcare Providers: BankingDealers.co.za  This test is not yet approved or  cleared by the [place] FDA and has been authorized for detection and/or diagnosis of SARS-CoV-2 by FDA under an Emergency Use Authorization (EUA).  This EUA will remain in effect (meaning this test can be used) for the duration of the COVID-19 declaration under Section 564(b)(1) of the Act, 21 U.S.C. section 360bbb-3(b)(1), unless the authorization is terminated or revoked sooner.  Performed at [facility], [place] [address], [place], [place] [address]   Stool culture (children & immunocomp patients)     Status: None   Collection Time: 10/21/19  2:34 PM   Specimen: Stool  Result Value Ref Range Status   Salmonella/Shigella Screen Final report  Final   Campylobacter Culture Final report  Final   E coli, Shiga toxin Assay Negative Negative Final    Comment: (NOTE) Performed At: [facility] [address] [place], [place] [id number] Rush Farmer MD YD:[id number]   OVA + PARASITE EXAM     Status: None   Collection Time: 10/21/19  2:34 PM   Specimen: Stool  Result Value Ref Range Status   OVA + PARASITE EXAM Final report  Final    Comment: (NOTE) These results were obtained using wet preparation(s) and trichrome stained smear. This test does not include testing for Cryptosporidium parvum, Cyclospora, or Microsporidia. Performed At: [place], [place] [id number] Caprice Red MD [id number]    Source of Sample STOOL  Final    Comment: Performed at [facility], [place] [address] [place], [place] [address]  STOOL CULTURE REFLEX - RSASHR     Status: None   Collection Time: 10/21/19  2:34 PM  Result Value Ref Range Status   Stool Culture result 1 (RSASHR) Comment  Final    Comment: (NOTE) No Salmonella or Shigella recovered. Performed At: [facility] [address] [place], [place] [id number] Rush Farmer MD TZ:[id number]   STOOL CULTURE Reflex - CMPCXR     Status: None   Collection Time: 10/21/19  2:34 PM  Result Value Ref Range Status  Stool Culture result 1 (CMPCXR) Comment  Final    Comment: (NOTE) No Campylobacter species isolated. Performed At: [facility] [place], [place] [id number] Rush Farmer MD [id number]   CSF culture with Stat gram stain     Status: None   Collection Time: 10/22/19  3:15 PM   Specimen: CSF; Cerebrospinal Fluid  Result Value Ref Range Status   Specimen Description CSF  Final   Special Requests NONE  Final   Gram Stain   Final    WBC PRESENT, PREDOMINANTLY PMN NO ORGANISMS SEEN CYTOSPIN SMEAR    Culture   Final    NO GROWTH 3 DAYS Performed at [facility], [address], [place], [place] [address]    Report Status 10/25/2019 FINAL  Final    Radiology Reports CT CHEST W CONTRAST  Result Date: 10/23/2019 CLINICAL DATA:  Seizure, abnormal CT abdomen with basilar lung changes EXAM: CT CHEST WITH CONTRAST TECHNIQUE: Multidetector CT imaging of the chest was performed during intravenous contrast administration. Sagittal and coronal MPR images reconstructed from axial data set. CONTRAST:  58mL OMNIPAQUE IOHEXOL 300 MG/ML  SOLN IV COMPARISON:  CT abdomen and pelvis 10/21/2019 FINDINGS: Cardiovascular: Atherosclerotic calcification aorta, proximal great vessels and coronary arteries. Aorta normal caliber. Heart normal size. No pericardial effusion. Mediastinum/Nodes: Esophagus unremarkable. Base of cervical region normal appearance. No thoracic adenopathy. Lungs/Pleura: Linear  areas of atelectasis versus scarring scattered in both lungs. Patchy areas of peribronchovascular density are seen in both lungs, some associated with minimal bronchiectasis, favor post inflammatory. Central peribronchial thickening present. No segmental consolidation, pleural effusion or pneumothorax. 3 mm LEFT upper lobe nodule image 30. No additional pulmonary mass/nodule. Upper Abdomen: Stomach distended by food debris. Remaining upper abdomen unremarkable Musculoskeletal: No osseous abnormalities. Scattered chest wall and axillary soft tissue edema, nonspecific. IMPRESSION: Scattered areas of atelectasis versus scarring in both lungs with few scattered areas of peribronchovascular opacity bilaterally, a few of which are associated with minimal bronchiectasis, favor postinflammatory scarring. Single 3 mm nonspecific LEFT upper lobe nodule. Short-term follow-up CT recommended in 3 months to reassess the areas of suspected scarring to ensure stability. Aortic Atherosclerosis (ICD10-I70.0). Electronically Signed   By: Lavonia Dana M.D.   On: 10/23/2019 13:00   MR BRAIN WO CONTRAST  Result Date: 10/19/2019 CLINICAL DATA:  Stroke follow-up. Left-sided weakness. Hypoglycemia. Witnessed seizures by EMS. Clinically suspected Todd's paralysis. EXAM: MRI HEAD WITHOUT CONTRAST TECHNIQUE: Multiplanar, multiecho pulse sequences of the brain and surrounding structures were obtained without intravenous contrast. COMPARISON:  Head CT 10/19/2019 FINDINGS: The study is mildly motion degraded. Brain: There is mild restricted diffusion involving cortex in the medial right frontal lobe, right insula, and likely right hippocampus which does not conform to a vascular territory and is without associated edema. No intracranial hemorrhage, mass, midline shift, or extra-axial fluid collection is identified. No significant white matter disease is seen for age. The ventricles and sulci are normal. Vascular: Major intracranial vascular  flow voids are preserved. Skull and upper cervical spine: Unremarkable bone marrow signal para Sinuses/Orbits: Bilateral cataract extraction. Small mucous retention cysts in the maxillary sinuses. Clear mastoid air cells. Other: None. IMPRESSION: Mild restricted diffusion in the medial right frontal lobe, right insula, and likely right hippocampus most consistent with recent seizure activity. Electronically Signed   By: Logan Bores M.D.   On: 10/19/2019 16:48   MR BRAIN W CONTRAST  Result Date: 10/20/2019 CLINICAL DATA:  Seizure.  Abnormal MRI. EXAM: MRI HEAD WITH CONTRAST TECHNIQUE:  Multiplanar, multiecho pulse sequences of the brain and surrounding structures were obtained with intravenous contrast. CONTRAST:  27mL GADAVIST GADOBUTROL 1 MMOL/ML IV SOLN COMPARISON:  MRI head without contrast 05/21/2019 FINDINGS: Brain: Review of the MRI from yesterday reveals restricted diffusion in the right medial frontal cortex, also in the right insula and right hippocampus. Note that the lesions are best seen on diffusion-weighted imaging with no significant associated FLAIR cortical edema. These findings are unilateral. No abnormal enhancement on today's study. Ventricle size is normal. Diffusion-weighted imaging was not repeated today IMPRESSION: Normal enhancement of the brain. Based on the findings yesterday, differential includes seizure related cortical activity, autoimmune related encephalitis, and less likely viral encephalitis. Consider lumbar puncture. Short-term follow-up MRI with contrast may be helpful. Electronically Signed   By: Franchot Gallo M.D.   On: 10/20/2019 16:40   CT ABDOMEN PELVIS W CONTRAST  Result Date: 10/22/2019 CLINICAL DATA:  Unintended weight loss. EXAM: CT ABDOMEN AND PELVIS WITH CONTRAST TECHNIQUE: Multidetector CT imaging of the abdomen and pelvis was performed using the standard protocol following bolus administration of intravenous contrast. CONTRAST:  133mL OMNIPAQUE IOHEXOL 300  MG/ML  SOLN COMPARISON:  None. FINDINGS: Lower chest: Majority of the chest is included in the field of view. Scattered linear and bandlike opacities throughout both lungs are typical of scarring. There is a subpleural 5 mm left upper lobe pulmonary nodule, series 6, image 26. Scattered peribronchovascular irregular opacities in the right lower lobe series 6, image 53, right middle lobe, series 6, image 39, left lower lobe, same image, left upper lobe series 6, image 29, and right mid lung same image. No dominant pulmonary mass. Hepatobiliary: Scattered subcentimeter hypodensities in the liver are too small to characterize but likely small cysts or biliary hamartomas. No evidence of suspicious liver lesion. Gallbladder physiologically distended, no calcified stone. No biliary dilatation. Pancreas: No ductal dilatation or inflammation. Spleen: Normal in size without focal abnormality. Adrenals/Urinary Tract: Normal adrenal glands. There is right hydroureteronephrosis. The ureter is dilated to pelvic brim. There is mild left hydronephrosis and proximal hydroureter, with similar ureteral dilatation. Diminished excretion on delayed phase imaging, right greater than left. No evidence of focal renal mass. Urinary bladder is displaced anteriorly by large stool ball in the rectum. Stomach/Bowel: Large inspissated stool ball distends the rectum with rectal distention of 8.6 cm. Associated rectal wall thickening and perirectal edema. No pneumatosis. Approximately 6.3 cm length area of sigmoid colonic wall thickening with pericolonic edema just proximal to the large volume of rectosigmoid stool. Large volume of stool throughout the remainder of the colon, there is significant sigmoid colonic redundancy. No additional areas of colonic wall thickening. The appendix is not confidently visualized. The stomach is unremarkable. No small bowel obstruction or abnormal distention. No definite small bowel inflammation.  Vascular/Lymphatic: Moderate aorto bi-iliac atherosclerosis, advanced for age. No aortic aneurysm. Patent portal vein. There is no adenopathy in the abdomen or pelvis. Reproductive: Calcified uterine fibroids. The uterus is displaced into the right pelvis and anteriorly by large rectosigmoid stool burden. No evidence of adnexal mass. Other: Perirectal and distal pericolonic edema with small amount of presacral fluid. No other ascites. There is no free air or perforation. Mild generalized body wall edema. Musculoskeletal: Mild L1 superior endplate compression fracture, age indeterminate. No evidence of focal bone lesion. IMPRESSION: 1. Large inspissated stool ball distends the rectum with rectal distention of 8.6 cm. Associated rectal wall thickening and perirectal edema, suspicious for fecal impaction. The stool distended rectum causes mass effect  on the urinary bladder, uterus, and distal ureters with bilateral hydroureteronephrosis, right greater than left. 2. Approximately 6.3 cm length area of sigmoid colonic wall thickening with pericolonic edema just proximal to the large volume of rectosigmoid stool. This may represent focal colitis, including stercoral colitis versus colonic neoplasm. 3. Large volume of stool throughout the remainder of the colon. There is significant sigmoid colonic redundancy. 4. Scattered peribronchovascular irregular opacities in the lung bases, likely infectious or inflammatory. There is a 5 mm subpleural left upper lobe pulmonary nodule. No follow-up needed if patient is low-risk (and has no known or suspected primary neoplasm). Non-contrast chest CT can be considered in 12 months if patient is high-risk. This recommendation follows the consensus statement: Guidelines for Management of Incidental Pulmonary Nodules Detected on CT Images: From the Fleischner Society 2017; Radiology 2017; 284:228-243. 5. Mild L1 superior endplate compression fracture, age indeterminate. Aortic  Atherosclerosis (ICD10-I70.0). Electronically Signed   By: Keith Rake M.D.   On: 10/22/2019 00:00   DG CHEST PORT 1 VIEW  Result Date: 10/19/2019 CLINICAL DATA:  59 year old female with left-sided weakness. Concern for stroke. EXAM: PORTABLE CHEST 1 VIEW COMPARISON:  Chest radiograph dated 01/14/2005. FINDINGS: No focal consolidation, pleural effusion or pneumothorax. The cardiac silhouette is within limits. No acute osseous pathology. IMPRESSION: No active disease. Electronically Signed   By: Anner Crete M.D.   On: 10/19/2019 19:28   EEG adult  Result Date: 10/19/2019 Lora Havens, MD     10/19/2019  8:23 PM Patient Name: Glendi Mohiuddin MRN: [id number] Epilepsy Attending: Lora Havens Referring Physician/Provider: Dr Amie [place] Date: 10/19/2019 Duration: 30.57 mins Patient history: 59yo F who presented with ams and seizure. EEG to evaluate for seizure, Level of alertness: Awake AEDs during EEG study:  keppra Technical aspects: This EEG study was done with scalp electrodes positioned according to the 10-20 International system of electrode placement. Electrical activity was acquired at a sampling rate of 500Hz  and reviewed with a high frequency filter of 70Hz  and a low frequency filter of 1Hz . EEG data were recorded continuously and digitally stored. Description: No posterior dominant rhythm was seen. EEG showed generalize, maximal bifrontal periodic epileptiform discharges with triphasic morphology at 0.25-0.5Hz . EEG also showed continuous generalized 8-9Hz  alpha activity.  Hyperventilation and photic stimulation were not performed.   ABNORMALITY -Periodic epileptiform discharges with triphasic morphology, generalized, maximal bifrontal IMPRESSION: This study showed evidence of generalized epileptogenicity with epileptiform discharges at 0.25-0.5hz  which is on the ictal-interictal continuum. No definite seizures were seen throughout the recording. Dr Cheral Marker was notified. Priyanka Barbra Sarks    Overnight EEG with video  Result Date: 10/20/2019 Lora Havens, MD     10/21/2019  1:03 PM Patient Name: Ileene Allie MRN: [id number] Epilepsy Attending: Lora Havens Referring Physician/Provider: Dr Milus Banister Duration: 10/19/2019 2027 to 10/20/2019 2027  Patient history: 59yo F who presented with ams and seizure. EEG to evaluate for seizure,  Level of alertness: Awake  AEDs during EEG study:  keppra  Technical aspects: This EEG study was done with scalp electrodes positioned according to the 10-20 International system of electrode placement. Electrical activity was acquired at a sampling rate of 500Hz  and reviewed with a high frequency filter of 70Hz  and a low frequency filter of 1Hz . EEG data were recorded continuously and digitally stored.  Description: No posterior dominant rhythm was seen. EEG initially showed generalized, maximal bifrontal periodic epileptiform discharges with triphasic morphology at 0.25-0.5Hz . Gradually, eeg showed right frontocentral periodic epileptiform discharges at  1Hz . EEG also showed continuous generalized 8-9Hz  alpha activity. Event button was pressed multiple times during which patient was noted to have left side face twitching, lasting about 30 seconds to one minute. Concomitant eeg showed rhythmic sharply contoured 5-9h generalized theta-alpha activity which evolved into 3-5Hz  theta-delta activity.  ABNORMALITY - Seizure, generalized - Periodic epileptiform discharges with triphasic morphology, generalized, maximal bifrontal - Continuous slow, generalized  IMPRESSION: This study showed multiple seizures with generalized onset during which patient was noted to have left face twitching, lasting 30 seconds to one minute. Additionally, there are epileptiform discharges at 0.25-0.5hz  which is on the ictal-interictal continuum as well as moderate diffuse encephalopathy, non specific to etiology.  Lora Havens   CT HEAD CODE STROKE WO CONTRAST  Result  Date: 10/19/2019 CLINICAL DATA:  Code stroke.  Left-sided weakness. EXAM: CT HEAD WITHOUT CONTRAST TECHNIQUE: Contiguous axial images were obtained from the base of the skull through the vertex without intravenous contrast. COMPARISON:  None. FINDINGS: The study is mildly motion degraded. Brain: Within limitations of motion, no acute infarct, intracranial hemorrhage, mass, midline shift, or extra-axial fluid collection is identified. The ventricles and sulci are normal. Vascular: Calcified atherosclerosis at the skull base. No hyperdense vessel. Skull: No fracture or suspicious osseous lesion. Sinuses/Orbits: Partially visualized mild mucosal thickening in the left maxillary sinus. Bilateral cataract extraction. Other: None. ASPECTS [facility] Stroke Program Early CT Score) - Ganglionic level infarction (caudate, lentiform nuclei, internal capsule, insula, M1-M3 cortex): 7 - Supraganglionic infarction (M4-M6 cortex): 3 Total score (0-10 with 10 being normal): 10 IMPRESSION: 1. No evidence of acute intracranial abnormality within limitations of mild motion. 2. ASPECTS is 10. These results were communicated to Dr. Rory Percy at 3:50 pm on 10/19/2019 by text page via the [facility] messaging system. Electronically Signed   By: Logan Bores M.D.   On: 10/19/2019 15:50    Lab Data:  CBC: Recent Labs  Lab 10/25/19 0354 10/27/19 0240 10/29/19 0737  WBC 8.6 8.5 8.6  NEUTROABS 7.1 6.5  --   HGB 9.0* 9.6* 9.7*  HCT 27.0* 29.4* 30.0*  MCV 86.0 87.0 88.5  PLT 445* 491* 109*   Basic Metabolic Panel: Recent Labs  Lab 10/25/19 0354 10/27/19 0240 10/28/19 0343 10/29/19 0737  NA 141 141 139 141  K 3.2* 3.5 3.1* 3.6  CL 106 105 105 104  CO2 25 22 26 28   GLUCOSE 63* 153* 56* 75  BUN 10 12 13 7   CREATININE 0.67 0.79 0.59 0.59  CALCIUM 8.7* 8.7* 8.4* 8.3*   GFR: Estimated Creatinine Clearance: 63.9 mL/min (by C-G formula based on SCr of 0.59 mg/dL). Liver Function Tests: No results for input(s): AST, ALT,  ALKPHOS, BILITOT, PROT, ALBUMIN in the last 168 hours. No results for input(s): LIPASE, AMYLASE in the last 168 hours. No results for input(s): AMMONIA in the last 168 hours. Coagulation Profile: No results for input(s): INR, PROTIME in the last 168 hours. Cardiac Enzymes: No results for input(s): CKTOTAL, CKMB, CKMBINDEX, TROPONINI in the last 168 hours. BNP (last 3 results) No results for input(s): PROBNP in the last 8760 hours. HbA1C: No results for input(s): HGBA1C in the last 72 hours. CBG: Recent Labs  Lab 10/29/19 0618 10/29/19 0631 10/29/19 0642 10/29/19 0656 10/29/19 1043  GLUCAP 61* 59* 60* 71 78   Lipid Profile: No results for input(s): CHOL, HDL, LDLCALC, TRIG, CHOLHDL, LDLDIRECT in the last 72 hours. Thyroid Function Tests: No results for input(s): TSH, T4TOTAL, FREET4, T3FREE, THYROIDAB in the last 72 hours.  Anemia Panel: No results for input(s): VITAMINB12, FOLATE, FERRITIN, TIBC, IRON, RETICCTPCT in the last 72 hours. Urine analysis:    Component Value Date/Time   COLORURINE COLORLESS (A) 10/08/2019 1848   APPEARANCEUR CLEAR (A) 10/08/2019 1848   LABSPEC 1.017 10/08/2019 1848   PHURINE 6.0 10/08/2019 1848   GLUCOSEU >=500 (A) 10/08/2019 1848   HGBUR NEGATIVE 10/08/2019 1848   BILIRUBINUR NEGATIVE 10/08/2019 1848   KETONESUR NEGATIVE 10/08/2019 1848   PROTEINUR NEGATIVE 10/08/2019 1848   NITRITE NEGATIVE 10/08/2019 1848   LEUKOCYTESUR NEGATIVE 10/08/2019 1848     Thunder Bridgewater M.D. [facility] 10/29/2019, 2:15 PM   Call night coverage person covering after 7pm

## 2019-10-29 NOTE — Plan of Care (Signed)
  Problem: Education: Goal: Knowledge of General Education information will improve Description: Including pain rating scale, medication(s)/side effects and non-pharmacologic comfort measures Outcome: Progressing   Problem: Health Behavior/Discharge Planning: Goal: Ability to manage health-related needs will improve Outcome: Progressing   Problem: Clinical Measurements: Goal: Ability to maintain clinical measurements within normal limits will improve Outcome: Progressing   Problem: Activity: Goal: Risk for activity intolerance will decrease Outcome: Progressing   Problem: Nutrition: Goal: Adequate nutrition will be maintained Outcome: Progressing   Problem: Coping: Goal: Level of anxiety will decrease Outcome: Progressing   Problem: Elimination: Goal: Will not experience complications related to bowel motility Outcome: Progressing   Problem: Pain Managment: Goal: General experience of comfort will improve Outcome: Progressing   Problem: Safety: Goal: Ability to remain free from injury will improve Outcome: Progressing   Problem: Skin Integrity: Goal: Risk for impaired skin integrity will decrease Outcome: Progressing   Problem: Education: Goal: Expressions of having a comfortable level of knowledge regarding the disease process will increase Outcome: Progressing   Problem: Coping: Goal: Ability to adjust to condition or change in health will improve Outcome: Progressing   Problem: Health Behavior/Discharge Planning: Goal: Compliance with prescribed medication regimen will improve Outcome: Progressing   Problem: Medication: Goal: Risk for medication side effects will decrease Outcome: Progressing   Problem: Clinical Measurements: Goal: Complications related to the disease process, condition or treatment will be avoided or minimized Outcome: Progressing   Problem: Safety: Goal: Verbalization of understanding the information provided will improve Outcome:  Progressing   Problem: Self-Concept: Goal: Level of anxiety will decrease Outcome: Progressing

## 2019-10-30 LAB — CBC
HCT: 31.5 % — ABNORMAL LOW (ref 36.0–46.0)
Hemoglobin: 10.2 g/dL — ABNORMAL LOW (ref 12.0–15.0)
MCH: 29 pg (ref 26.0–34.0)
MCHC: 32.4 g/dL (ref 30.0–36.0)
MCV: 89.5 fL (ref 80.0–100.0)
Platelets: 466 10*3/uL — ABNORMAL HIGH (ref 150–400)
RBC: 3.52 MIL/uL — ABNORMAL LOW (ref 3.87–5.11)
RDW: 14.9 % (ref 11.5–15.5)
WBC: 8.7 10*3/uL (ref 4.0–10.5)
nRBC: 0 % (ref 0.0–0.2)

## 2019-10-30 LAB — GLUCOSE, CAPILLARY
Glucose-Capillary: 110 mg/dL — ABNORMAL HIGH (ref 70–99)
Glucose-Capillary: 92 mg/dL (ref 70–99)
Glucose-Capillary: 133 mg/dL — ABNORMAL HIGH (ref 70–99)
Glucose-Capillary: 91 mg/dL (ref 70–99)
Glucose-Capillary: 191 mg/dL — ABNORMAL HIGH (ref 70–99)
Glucose-Capillary: 214 mg/dL — ABNORMAL HIGH (ref 70–99)
Glucose-Capillary: 112 mg/dL — ABNORMAL HIGH (ref 70–99)

## 2019-10-30 LAB — BASIC METABOLIC PANEL
Anion gap: 8 (ref 5–15)
BUN: 5 mg/dL — ABNORMAL LOW (ref 6–20)
CO2: 28 mmol/L (ref 22–32)
Calcium: 8.5 mg/dL — ABNORMAL LOW (ref 8.9–10.3)
Chloride: 103 mmol/L (ref 98–111)
Creatinine, Ser: 0.6 mg/dL (ref 0.44–1.00)
GFR calc Af Amer: >60 mL/min (ref >60)
GFR calc non Af Amer: >60 mL/min (ref >60)
Glucose, Bld: 117 mg/dL — ABNORMAL HIGH (ref 70–99)
Potassium: 4.3 mmol/L (ref 3.5–5.1)
Sodium: 139 mmol/L (ref 135–145)

## 2019-10-30 LAB — INSULIN, RANDOM: Insulin: 1.5 u[IU]/mL — ABNORMAL LOW (ref 2.6–24.9)

## 2019-10-30 LAB — C-PEPTIDE: C-Peptide: 0.8 ng/mL — ABNORMAL LOW (ref 1.1–4.4)

## 2019-10-30 MED ORDER — ENSURE ENLIVE PO LIQD
237.0000 mL | Freq: Two times a day (BID) | ORAL | Status: DC
  Administered 2019-10-31: 237 mL via ORAL

## 2019-10-30 MED ORDER — PEG-KCL-NACL-NASULF-NA ASC-C 100 G PO SOLR: 1.0000 | Freq: Once | ORAL | Status: DC

## 2019-10-30 MED ORDER — BISACODYL 10 MG RE SUPP
10.0000 mg | Freq: Once | RECTAL | Status: AC
  Administered 2019-11-09: 10 mg via RECTAL
  Filled 2019-10-30 (×2): qty 1

## 2019-10-30 MED ORDER — PEG-KCL-NACL-NASULF-NA ASC-C 100 G PO SOLR
0.5000 | Freq: Once | ORAL | Status: AC
  Administered 2019-10-30: 100 g via ORAL
  Filled 2019-10-30: qty 1

## 2019-10-30 MED ORDER — PRO-STAT SUGAR FREE PO LIQD
30.0000 mL | Freq: Two times a day (BID) | ORAL | Status: DC
  Administered 2019-10-30 – 2019-11-12 (×26): 30 mL via ORAL
  Filled 2019-10-30 (×25): qty 30

## 2019-10-30 MED ORDER — PEG-KCL-NACL-NASULF-NA ASC-C 100 G PO SOLR
0.5000 | Freq: Once | ORAL | Status: AC
  Administered 2019-10-31: 100 g via ORAL

## 2019-10-30 NOTE — Progress Notes (Signed)
[facility]                                                                              Patient Demographics  Andrea [name], is a 59 y.o. female, DOB - 20-Nov-1960, NLZ:[id number]  Admit date - 10/19/2019   Admitting Physician Elmarie Shiley, MD  Outpatient Primary MD for the patient is Bright, Aaron Mose, MD  Outpatient specialists:   LOS - 10  days   Medical records reviewed and are as summarized below:    Chief Complaint  Patient presents with  . Code Stroke       Brief summary   Patient is a 59 year old female with history of insulin-dependent diabetes mellitus, hyperlipidemia, hypothyroidism who was brought to the emergency department for evaluation of hypoglycemia. She presented to the emergency department on 6/19 for similar problem and was discharged to home after correction ofhypoglycemia. When EMS arrived at home, patient had 2 focal seizures and was given IV Versed and glucose. On presentation she had mild left-sided weakness and dysarthria. She takes 70/30 insulin at home. On presentation, CT head did not show any acute abnormality but MRI findings are consistent with recent seizure activity. Neurology was consulted. Hospital course remarkable for severe constipation.she developed twitching of face muscles on 09/2319. She underwent LP by neurology suspecting for autoimmune/inflammatory disorder. Started on high-dose steroids. PT recommending SNF on DC    Assessment & Plan   Principal problem Acute metabolic encephalopathy -Likely secondary to hypoglycemia versus seizure -Seen this morning, alert and awake, encouraged out of bed to chair, increase mobility, PT -PT evaluation done today, recommended home health PT, supervision   Active problems Seizures -New onset, likely due to hypoglycemia however EEG also showed epileptiform discharges. -Neurology was consulted, patient started on Keppra and Clobazam -No abnormal enhancement seen on  MRI, again had facial muscle twitching on 6/23. -Neurology did an LP, started on Solu-Medrol for possible autoimmune/inflammatory disorder/paraneoplastic disorder.  Unclear etiology, CSF IgG cell count, HSV, myelin basic protein, VDRL normal.  CSF culture negative -Neurology recommended IV Solu-Medrol 1 g daily for 5 doses, completed on 6/27 -Autoimmune and paraneoplastic labs sent to [facility] -Continued on high-dose Keppra and clobazam -Neurology recommended follow-up with Dr. Delice Lesch in 6 weeks and consider repeat MRI brain   Type I diabetic with hypoglycemia episodes, uncontrolled, IDDM -Reportedly on Novolin 70/30 at home 80 units in the morning and 85 units at night however having intermittent hypoglycemia episodes while inpatient -Unclear etiology for intermittent hypoglycemia, currently only on sliding scale insulin.  Hemoglobin A1c 12.4 on 6/22 -C-peptide, pro-insulin level both low, patient is type I diabetic, random insulin level normal -TSH 1.3, albumin 2.4 -Registered dietitian consult, placed on prostat, Ensure nutritional supplements, encourage p.o. diet -Patient follows endocrinologist, Dr. Honor Junes  Anorexia/weight loss, early satiety, abnormal CT scan -CT abdomen pelvis showed fecal impaction, bilateral hydroureteronephrosis due to distended rectum, approximately 6.3 cm sigmoid colonic wall thickening may represent focal colitis including stercoral colitis versus colonic neoplasm, large volume of stool throughout the remainder of colon --Extensive GI work-up outpatient with [facility] (Dr [name]), had upper endoscopy on 04/09/2019 for the above complaints,  which showed chronic gastritis with H. pylori.  No metaplasia or dysplasia or carcinoma. -She had a normal GE study on 04/17/2019 which showed no gastroparesis -Patient had flex sig 2016 which had shown diverticulosis with nonthrombosed external hemorrhoids, polyps, otherwise benign. GI consulted, patient has not been  completed bowel prep, colonoscopy currently on hold -CEA, CA 19-9, normal   Hypothyroidism -Follows with endocrinology, Dr. Dina Rich, continue Synthroid, Cytomel  Left upper lobe nodule, 3 mm incidental finding -Repeat CT chest in 3 months, past smoker  Severe constipation -CT abdomen/pelvis findings noted, placed on Senokot-S, MiraLAX daily  Generalized debility, deconditioning -PT evaluation recommended today recommended home health, 24-hour supervision, 3 n1, DME rolling walker  Severe protein calorie malnutrition Estimated body mass index is 18.48 kg/m as calculated from the following:   Height as of 10/18/19: 5\' 7"  (1.702 m).   Weight as of 10/18/19: 53.5 kg.  Code Status: Full CODE STATUS DVT Prophylaxis:  Lovenox  Family Communication: Discussed all imaging results, lab results, explained to the patient   Disposition Plan:     Status is: Inpatient  Remains inpatient appropriate because:Inpatient level of care appropriate due to severity of illness   Dispo: The patient is from: Home              Anticipated d/c is to: Home              Anticipated d/c date is: 2 days              Patient currently is not medically stable to d/c.  Still has intermittent hypoglycemia episodes, colonoscopy pending.  Hopefully if CBGs are stabilized, will recommend outpatient follow-up with GI and [facility]ocrin[facility] at [facility]    Time Spent in minutes 25 minutes Procedures:  None  Consultants:   Gastroenterology  Antimicrobials:   Anti-infectives (From admission, onward)   None         Medications  Scheduled Meds: . aspirin EC  81 mg Oral Daily  . cloBAZam  5 mg Oral BID  . enoxaparin (LOVENOX) injection  40 mg Subcutaneous Q24H  . insulin aspart  0-6 Units Subcutaneous Q4H  . levETIRAcetam  1,500 mg Oral BID  . levothyroxine  150 mcg Oral Q0600  . liothyronine  5 mcg Oral Daily  . lisinopril  20 mg Oral Daily  . polyethylene glycol  17 g Oral Daily  . rosuvastatin  20 mg  Oral QHS  . senna-docusate  1 tablet Oral BID  . sodium chloride flush  3 mL Intravenous Once   Continuous Infusions: . sodium chloride 20 mL/hr at 10/30/19 0400   PRN Meds:.acetaminophen **OR** acetaminophen, hydrALAZINE, LORazepam, ondansetron (ZOFRAN) IV      Subjective:   Andrea [name] was seen and examined today.  Much more alert and awake today, still poor p.o. intake, colonoscopy still pending.  No nausea vomiting or diarrhea, no fevers.   Objective:   Vitals:   10/29/19 2020 10/30/19 0031 10/30/19 0417 10/30/19 1148  BP: 124/79 113/65 124/71 104/63  Pulse: 98 94 92 (!) 104  Resp: 18 18 13 19   Temp: 98.3 F (36.8 C) 98.5 F (36.9 C) 98.8 F (37.1 C) 97.6 F (36.4 C)  TempSrc: Oral Oral Oral Oral  SpO2: 100% 100% 100% 100%    Intake/Output Summary (Last 24 hours) at 10/30/2019 1348 Last data filed at 10/30/2019 0636 Gross per 24 hour  Intake 678.3 ml  Output 400 ml  Net 278.3 ml     Wt Readings from Last  3 Encounters:  10/18/19 53.5 kg  10/08/19 49.4 kg  08/16/19 66.7 kg   Physical Exam  General: Alert and oriented x 3, NAD  Cardiovascular: S1 S2 clear, RRR. No pedal edema b/l  Respiratory: CTAB, no wheezing, rales or rhonchi  Gastrointestinal: Soft, nontender, nondistended, NBS  Ext: no pedal edema bilaterally  Neuro: no new deficits  Musculoskeletal: No cyanosis, clubbing  Skin: No rashes  Psych: Normal affect and demeanor, alert and oriented x3     Data Reviewed:  I have personally reviewed following labs and imaging studies  Micro Results Recent Results (from the past 240 hour(s))  Stool culture (children & immunocomp patients)     Status: None   Collection Time: 10/21/19  2:34 PM   Specimen: Stool  Result Value Ref Range Status   Salmonella/Shigella Screen Final report  Final   Campylobacter Culture Final report  Final   E coli, Shiga toxin Assay Negative Negative Final    Comment: (NOTE) Performed At: [facility] [address] [place], [place] [id number] Rush Farmer MD [id number]   OVA + PARASITE EXAM     Status: None   Collection Time: 10/21/19  2:34 PM   Specimen: Stool  Result Value Ref Range Status   OVA + PARASITE EXAM Final report  Final    Comment: (NOTE) These results were obtained using wet preparation(s) and trichrome stained smear. This test does not include testing for Cryptosporidium parvum, Cyclospora, or Microsporidia. Performed At: [place], [place] [id number] Caprice Red MD [id number]    Source of Sample STOOL  Final    Comment: Performed at [facility], [place] [address] [place], [place] [address]  STOOL CULTURE REFLEX - RSASHR     Status: None   Collection Time: 10/21/19  2:34 PM  Result Value Ref Range Status   Stool Culture result 1 (RSASHR) Comment  Final    Comment: (NOTE) No Salmonella or Shigella recovered. Performed At: [facility] [address] [place], [place] [id number] Rush Farmer MD [id number]   STOOL CULTURE Reflex - CMPCXR     Status: None   Collection Time: 10/21/19  2:34 PM  Result Value Ref Range Status   Stool Culture result 1 (CMPCXR) Comment  Final    Comment: (NOTE) No Campylobacter species isolated. Performed At: [facility] [place], [place] [id number] Rush Farmer MD [id number]   CSF culture with Stat gram stain     Status: None   Collection Time: 10/22/19  3:15 PM   Specimen: CSF; Cerebrospinal Fluid  Result Value Ref Range Status   Specimen Description CSF  Final   Special Requests NONE  Final   Gram Stain   Final    WBC PRESENT, PREDOMINANTLY PMN NO ORGANISMS SEEN CYTOSPIN SMEAR    Culture   Final    NO GROWTH 3 DAYS Performed at [facility], [address], [place], [place] [address]    Report Status 10/25/2019 FINAL  Final    Radiology Reports CT CHEST W CONTRAST  Result Date: 10/23/2019 CLINICAL DATA:  Seizure, abnormal  CT abdomen with basilar lung changes EXAM: CT CHEST WITH CONTRAST TECHNIQUE: Multidetector CT imaging of the chest was performed during intravenous contrast administration. Sagittal and coronal MPR images reconstructed from axial data set. CONTRAST:  5mL OMNIPAQUE IOHEXOL 300 MG/ML  SOLN IV COMPARISON:  CT abdomen and pelvis 10/21/2019 FINDINGS: Cardiovascular: Atherosclerotic calcification aorta, proximal great vessels and coronary  arteries. Aorta normal caliber. Heart normal size. No pericardial effusion. Mediastinum/Nodes: Esophagus unremarkable. Base of cervical region normal appearance. No thoracic adenopathy. Lungs/Pleura: Linear areas of atelectasis versus scarring scattered in both lungs. Patchy areas of peribronchovascular density are seen in both lungs, some associated with minimal bronchiectasis, favor post inflammatory. Central peribronchial thickening present. No segmental consolidation, pleural effusion or pneumothorax. 3 mm LEFT upper lobe nodule image 30. No additional pulmonary mass/nodule. Upper Abdomen: Stomach distended by food debris. Remaining upper abdomen unremarkable Musculoskeletal: No osseous abnormalities. Scattered chest wall and axillary soft tissue edema, nonspecific. IMPRESSION: Scattered areas of atelectasis versus scarring in both lungs with few scattered areas of peribronchovascular opacity bilaterally, a few of which are associated with minimal bronchiectasis, favor postinflammatory scarring. Single 3 mm nonspecific LEFT upper lobe nodule. Short-term follow-up CT recommended in 3 months to reassess the areas of suspected scarring to ensure stability. Aortic Atherosclerosis (ICD10-I70.0). Electronically Signed   By: Lavonia Dana M.D.   On: 10/23/2019 13:00   MR BRAIN WO CONTRAST  Result Date: 10/19/2019 CLINICAL DATA:  Stroke follow-up. Left-sided weakness. Hypoglycemia. Witnessed seizures by EMS. Clinically suspected Todd's paralysis. EXAM: MRI HEAD WITHOUT CONTRAST TECHNIQUE:  Multiplanar, multiecho pulse sequences of the brain and surrounding structures were obtained without intravenous contrast. COMPARISON:  Head CT 10/19/2019 FINDINGS: The study is mildly motion degraded. Brain: There is mild restricted diffusion involving cortex in the medial right frontal lobe, right insula, and likely right hippocampus which does not conform to a vascular territory and is without associated edema. No intracranial hemorrhage, mass, midline shift, or extra-axial fluid collection is identified. No significant white matter disease is seen for age. The ventricles and sulci are normal. Vascular: Major intracranial vascular flow voids are preserved. Skull and upper cervical spine: Unremarkable bone marrow signal para Sinuses/Orbits: Bilateral cataract extraction. Small mucous retention cysts in the maxillary sinuses. Clear mastoid air cells. Other: None. IMPRESSION: Mild restricted diffusion in the medial right frontal lobe, right insula, and likely right hippocampus most consistent with recent seizure activity. Electronically Signed   By: Logan Bores M.D.   On: 10/19/2019 16:48   MR BRAIN W CONTRAST  Result Date: 10/20/2019 CLINICAL DATA:  Seizure.  Abnormal MRI. EXAM: MRI HEAD WITH CONTRAST TECHNIQUE: Multiplanar, multiecho pulse sequences of the brain and surrounding structures were obtained with intravenous contrast. CONTRAST:  81mL GADAVIST GADOBUTROL 1 MMOL/ML IV SOLN COMPARISON:  MRI head without contrast 05/21/2019 FINDINGS: Brain: Review of the MRI from yesterday reveals restricted diffusion in the right medial frontal cortex, also in the right insula and right hippocampus. Note that the lesions are best seen on diffusion-weighted imaging with no significant associated FLAIR cortical edema. These findings are unilateral. No abnormal enhancement on today's study. Ventricle size is normal. Diffusion-weighted imaging was not repeated today IMPRESSION: Normal enhancement of the brain. Based on the  findings yesterday, differential includes seizure related cortical activity, autoimmune related encephalitis, and less likely viral encephalitis. Consider lumbar puncture. Short-term follow-up MRI with contrast may be helpful. Electronically Signed   By: Franchot Gallo M.D.   On: 10/20/2019 16:40   CT ABDOMEN PELVIS W CONTRAST  Result Date: 10/22/2019 CLINICAL DATA:  Unintended weight loss. EXAM: CT ABDOMEN AND PELVIS WITH CONTRAST TECHNIQUE: Multidetector CT imaging of the abdomen and pelvis was performed using the standard protocol following bolus administration of intravenous contrast. CONTRAST:  166mL OMNIPAQUE IOHEXOL 300 MG/ML  SOLN COMPARISON:  None. FINDINGS: Lower chest: Majority of the chest is included in the field of view.  Scattered linear and bandlike opacities throughout both lungs are typical of scarring. There is a subpleural 5 mm left upper lobe pulmonary nodule, series 6, image 26. Scattered peribronchovascular irregular opacities in the right lower lobe series 6, image 53, right middle lobe, series 6, image 39, left lower lobe, same image, left upper lobe series 6, image 29, and right mid lung same image. No dominant pulmonary mass. Hepatobiliary: Scattered subcentimeter hypodensities in the liver are too small to characterize but likely small cysts or biliary hamartomas. No evidence of suspicious liver lesion. Gallbladder physiologically distended, no calcified stone. No biliary dilatation. Pancreas: No ductal dilatation or inflammation. Spleen: Normal in size without focal abnormality. Adrenals/Urinary Tract: Normal adrenal glands. There is right hydroureteronephrosis. The ureter is dilated to pelvic brim. There is mild left hydronephrosis and proximal hydroureter, with similar ureteral dilatation. Diminished excretion on delayed phase imaging, right greater than left. No evidence of focal renal mass. Urinary bladder is displaced anteriorly by large stool ball in the rectum. Stomach/Bowel:  Large inspissated stool ball distends the rectum with rectal distention of 8.6 cm. Associated rectal wall thickening and perirectal edema. No pneumatosis. Approximately 6.3 cm length area of sigmoid colonic wall thickening with pericolonic edema just proximal to the large volume of rectosigmoid stool. Large volume of stool throughout the remainder of the colon, there is significant sigmoid colonic redundancy. No additional areas of colonic wall thickening. The appendix is not confidently visualized. The stomach is unremarkable. No small bowel obstruction or abnormal distention. No definite small bowel inflammation. Vascular/Lymphatic: Moderate aorto bi-iliac atherosclerosis, advanced for age. No aortic aneurysm. Patent portal vein. There is no adenopathy in the abdomen or pelvis. Reproductive: Calcified uterine fibroids. The uterus is displaced into the right pelvis and anteriorly by large rectosigmoid stool burden. No evidence of adnexal mass. Other: Perirectal and distal pericolonic edema with small amount of presacral fluid. No other ascites. There is no free air or perforation. Mild generalized body wall edema. Musculoskeletal: Mild L1 superior endplate compression fracture, age indeterminate. No evidence of focal bone lesion. IMPRESSION: 1. Large inspissated stool ball distends the rectum with rectal distention of 8.6 cm. Associated rectal wall thickening and perirectal edema, suspicious for fecal impaction. The stool distended rectum causes mass effect on the urinary bladder, uterus, and distal ureters with bilateral hydroureteronephrosis, right greater than left. 2. Approximately 6.3 cm length area of sigmoid colonic wall thickening with pericolonic edema just proximal to the large volume of rectosigmoid stool. This may represent focal colitis, including stercoral colitis versus colonic neoplasm. 3. Large volume of stool throughout the remainder of the colon. There is significant sigmoid colonic redundancy.  4. Scattered peribronchovascular irregular opacities in the lung bases, likely infectious or inflammatory. There is a 5 mm subpleural left upper lobe pulmonary nodule. No follow-up needed if patient is low-risk (and has no known or suspected primary neoplasm). Non-contrast chest CT can be considered in 12 months if patient is high-risk. This recommendation follows the consensus statement: Guidelines for Management of Incidental Pulmonary Nodules Detected on CT Images: From the Fleischner Society 2017; Radiology 2017; 284:228-243. 5. Mild L1 superior endplate compression fracture, age indeterminate. Aortic Atherosclerosis (ICD10-I70.0). Electronically Signed   By: Keith Rake M.D.   On: 10/22/2019 00:00   DG CHEST PORT 1 VIEW  Result Date: 10/19/2019 CLINICAL DATA:  58 year old female with left-sided weakness. Concern for stroke. EXAM: PORTABLE CHEST 1 VIEW COMPARISON:  Chest radiograph dated 01/14/2005. FINDINGS: No focal consolidation, pleural effusion or pneumothorax. The cardiac silhouette is  within limits. No acute osseous pathology. IMPRESSION: No active disease. Electronically Signed   By: Anner Crete M.D.   On: 10/19/2019 19:28   EEG adult  Result Date: 10/19/2019 Lora Havens, MD     10/19/2019  8:23 PM Patient Name: Neri Vieyra MRN: [id number] Epilepsy Attending: Lora Havens Referring Physician/Provider: Dr Amie [place] Date: 10/19/2019 Duration: 30.57 mins Patient history: 59yo F who presented with ams and seizure. EEG to evaluate for seizure, Level of alertness: Awake AEDs during EEG study:  keppra Technical aspects: This EEG study was done with scalp electrodes positioned according to the 10-20 International system of electrode placement. Electrical activity was acquired at a sampling rate of 500Hz  and reviewed with a high frequency filter of 70Hz  and a low frequency filter of 1Hz . EEG data were recorded continuously and digitally stored. Description: No posterior dominant  rhythm was seen. EEG showed generalize, maximal bifrontal periodic epileptiform discharges with triphasic morphology at 0.25-0.5Hz . EEG also showed continuous generalized 8-9Hz  alpha activity.  Hyperventilation and photic stimulation were not performed.   ABNORMALITY -Periodic epileptiform discharges with triphasic morphology, generalized, maximal bifrontal IMPRESSION: This study showed evidence of generalized epileptogenicity with epileptiform discharges at 0.25-0.5hz  which is on the ictal-interictal continuum. No definite seizures were seen throughout the recording. Dr Cheral Marker was notified. Priyanka Barbra Sarks   Overnight EEG with video  Result Date: 10/20/2019 Lora Havens, MD     10/21/2019  1:03 PM Patient Name: Teretha Chalupa MRN: [id number] Epilepsy Attending: Lora Havens Referring Physician/Provider: Dr Milus Banister Duration: 10/19/2019 2027 to 10/20/2019 2027  Patient history: 59yo F who presented with ams and seizure. EEG to evaluate for seizure,  Level of alertness: Awake  AEDs during EEG study:  keppra  Technical aspects: This EEG study was done with scalp electrodes positioned according to the 10-20 International system of electrode placement. Electrical activity was acquired at a sampling rate of 500Hz  and reviewed with a high frequency filter of 70Hz  and a low frequency filter of 1Hz . EEG data were recorded continuously and digitally stored.  Description: No posterior dominant rhythm was seen. EEG initially showed generalized, maximal bifrontal periodic epileptiform discharges with triphasic morphology at 0.25-0.5Hz . Gradually, eeg showed right frontocentral periodic epileptiform discharges at 1Hz . EEG also showed continuous generalized 8-9Hz  alpha activity. Event button was pressed multiple times during which patient was noted to have left side face twitching, lasting about 30 seconds to one minute. Concomitant eeg showed rhythmic sharply contoured 5-9h generalized theta-alpha activity  which evolved into 3-5Hz  theta-delta activity.  ABNORMALITY - Seizure, generalized - Periodic epileptiform discharges with triphasic morphology, generalized, maximal bifrontal - Continuous slow, generalized  IMPRESSION: This study showed multiple seizures with generalized onset during which patient was noted to have left face twitching, lasting 30 seconds to one minute. Additionally, there are epileptiform discharges at 0.25-0.5hz  which is on the ictal-interictal continuum as well as moderate diffuse encephalopathy, non specific to etiology.  Lora Havens   CT HEAD CODE STROKE WO CONTRAST  Result Date: 10/19/2019 CLINICAL DATA:  Code stroke.  Left-sided weakness. EXAM: CT HEAD WITHOUT CONTRAST TECHNIQUE: Contiguous axial images were obtained from the base of the skull through the vertex without intravenous contrast. COMPARISON:  None. FINDINGS: The study is mildly motion degraded. Brain: Within limitations of motion, no acute infarct, intracranial hemorrhage, mass, midline shift, or extra-axial fluid collection is identified. The ventricles and sulci are normal. Vascular: Calcified atherosclerosis at the skull base. No hyperdense vessel. Skull: No fracture or suspicious  osseous lesion. Sinuses/Orbits: Partially visualized mild mucosal thickening in the left maxillary sinus. Bilateral cataract extraction. Other: None. ASPECTS [facility] Stroke Program Early CT Score) - Ganglionic level infarction (caudate, lentiform nuclei, internal capsule, insula, M1-M3 cortex): 7 - Supraganglionic infarction (M4-M6 cortex): 3 Total score (0-10 with 10 being normal): 10 IMPRESSION: 1. No evidence of acute intracranial abnormality within limitations of mild motion. 2. ASPECTS is 10. These results were communicated to Dr. Rory Percy at 3:50 pm on 10/19/2019 by text page via the [facility] messaging system. Electronically Signed   By: Logan Bores M.D.   On: 10/19/2019 15:50    Lab Data:  CBC: Recent Labs  Lab 10/25/19 0354  10/27/19 0240 10/29/19 0737 10/30/19 0327  WBC 8.6 8.5 8.6 8.7  NEUTROABS 7.1 6.5  --   --   HGB 9.0* 9.6* 9.7* 10.2*  HCT 27.0* 29.4* 30.0* 31.5*  MCV 86.0 87.0 88.5 89.5  PLT 445* 491* 472* 696*   Basic Metabolic Panel: Recent Labs  Lab 10/25/19 0354 10/27/19 0240 10/28/19 0343 10/29/19 0737 10/30/19 0327  NA 141 141 139 141 139  K 3.2* 3.5 3.1* 3.6 4.3  CL 106 105 105 104 103  CO2 25 22 26 28 28   GLUCOSE 63* 153* 56* 75 117*  BUN 10 12 13 7  5*  CREATININE 0.67 0.79 0.59 0.59 0.60  CALCIUM 8.7* 8.7* 8.4* 8.3* 8.5*   GFR: Estimated Creatinine Clearance: 63.9 mL/min (by C-G formula based on SCr of 0.6 mg/dL). Liver Function Tests: No results for input(s): AST, ALT, ALKPHOS, BILITOT, PROT, ALBUMIN in the last 168 hours. No results for input(s): LIPASE, AMYLASE in the last 168 hours. No results for input(s): AMMONIA in the last 168 hours. Coagulation Profile: No results for input(s): INR, PROTIME in the last 168 hours. Cardiac Enzymes: No results for input(s): CKTOTAL, CKMB, CKMBINDEX, TROPONINI in the last 168 hours. BNP (last 3 results) No results for input(s): PROBNP in the last 8760 hours. HbA1C: No results for input(s): HGBA1C in the last 72 hours. CBG: Recent Labs  Lab 10/29/19 2119 10/30/19 0013 10/30/19 0416 10/30/19 0818 10/30/19 1106  GLUCAP 191* 133* 110* 92 91   Lipid Profile: No results for input(s): CHOL, HDL, LDLCALC, TRIG, CHOLHDL, LDLDIRECT in the last 72 hours. Thyroid Function Tests: No results for input(s): TSH, T4TOTAL, FREET4, T3FREE, THYROIDAB in the last 72 hours. Anemia Panel: No results for input(s): VITAMINB12, FOLATE, FERRITIN, TIBC, IRON, RETICCTPCT in the last 72 hours. Urine analysis:    Component Value Date/Time   COLORURINE COLORLESS (A) 10/08/2019 1848   APPEARANCEUR CLEAR (A) 10/08/2019 1848   LABSPEC 1.017 10/08/2019 1848   PHURINE 6.0 10/08/2019 1848   GLUCOSEU >=500 (A) 10/08/2019 1848   HGBUR NEGATIVE 10/08/2019  1848   BILIRUBINUR NEGATIVE 10/08/2019 1848   KETONESUR NEGATIVE 10/08/2019 1848   PROTEINUR NEGATIVE 10/08/2019 1848   NITRITE NEGATIVE 10/08/2019 1848   LEUKOCYTESUR NEGATIVE 10/08/2019 1848     Cheral Cappucci M.D. [facility] 10/30/2019, 1:48 PM   Call night coverage person covering after 7pm

## 2019-10-30 NOTE — Progress Notes (Signed)
Occupational Therapy Treatment Patient Details Name: Andrea [name] MRN: [id number] DOB: 1960-06-21 Today's Date: 10/30/2019    History of present illness 59 y.o. female with past medical history significant for diabetes insulin-dependent, hyperlipidemia, hypothyroidism, who was just evaluated at [facility], ER on 6/19 for hypoglycemia of 24, patient was initially nonverbal with generalized weakness at that time.  Hypoglycemia was corrected and she was discharged home.  Today family called EMS because patient's blood sugar was again low at 30.  Patient was also noted to have  left-sided weakness.  Patient was last seen normal at 2 AM.  When EMS was on the scene patient had 2 focal seizures.  Patient received IV Versed and glucose.  On arrival to the ED patient had still mild left-sided weakness and dysarthria.   OT comments  Patient continues to make steady progress towards goals in skilled OT session. Patient's session encompassed functional transfers to [facility], and basic ADLs to complete peri-care and lower body dressing. Pt remains to have a flat affect, but is alert and oriented and participatory. Pt remains incontinent of bowels, however is able to statically stand to clean up without external support. Pt requires min extra time to complete basic ADLs, but no cues for sequencing. Therapist encouraged increased movement in order to aid in overall activity tolerance; will continue to follow acutely.    Follow Up Recommendations  SNF;Supervision/Assistance - 24 hour    Equipment Recommendations  3 in 1 bedside commode    Recommendations for Other Services      Precautions / Restrictions Precautions Precautions: Fall Precaution Comments: seizure Restrictions Weight Bearing Restrictions: No       Mobility Bed Mobility               General bed mobility comments: In chair upon arrival  Transfers Overall transfer level: Needs assistance Equipment used: Rolling walker (2  wheeled);None Transfers: Sit to/from [employer] to Stand: Min guard Stand pivot transfers: Min guard       General transfer comment: Pt was able to power-up to full stand without the RW. She initially used RW to SPT to the [facility] and then was able to take pivotal steps back to the bed without RW (1HHA)    Balance Overall balance assessment: Needs assistance Sitting-balance support: Single extremity supported;Feet supported Sitting balance-Leahy Scale: Fair Sitting balance - Comments: close supervision    Standing balance support: Single extremity supported Standing balance-Leahy Scale: Fair Standing balance comment: min guard for stability standing during pericare                           ADL either performed or assessed with clinical judgement   ADL Overall ADL's : Needs assistance/impaired     Grooming: Wash/dry hands;Oral care;Min guard;Set up       Lower Body Bathing: Min guard;Sit to/from stand;Sitting/lateral leans       Lower Body Dressing: Minimal assistance;Sit to/from stand;Sitting/lateral leans Lower Body Dressing Details (indicate cue type and reason): threading brief but able to doff Toilet Transfer: Minimal assistance;Stand-pivot;BSC Toilet Transfer Details (indicate cue type and reason): HHA to transfer Toileting- Clothing Manipulation and Hygiene: Min guard;Minimal assistance Toileting - Clothing Manipulation Details (indicate cue type and reason): Able to complete several rounds of peri care in standing (able to statically stand to complete without assist)     Functional mobility during ADLs: Minimal assistance;Rolling walker;Min guard General ADL Comments: pt with increased ability to participate, but demonstrates  flat affect when completing ADLs     Vision       Perception     Praxis      Cognition Arousal/Alertness: Lethargic Behavior During Therapy: Flat affect Overall Cognitive Status: No family/caregiver  present to determine baseline cognitive functioning                                 General Comments: Slow processing        Exercises     Shoulder Instructions       General Comments      Pertinent Vitals/ Pain       Pain Assessment: No/denies pain  Home Living                                          Prior Functioning/Environment              Frequency  Min 2X/week        Progress Toward Goals  OT Goals(current goals can now be found in the care plan section)  Progress towards OT goals: Progressing toward goals  Acute Rehab OT Goals Patient Stated Goal: None stated this session OT Goal Formulation: With patient Time For Goal Achievement: 11/05/19 Potential to Achieve Goals: Good  Plan Discharge plan remains appropriate    Co-evaluation                 AM-PAC OT "6 Clicks" Daily Activity     Outcome Measure   Help from another person eating meals?: A Little Help from another person taking care of personal grooming?: A Little Help from another person toileting, which includes using toliet, bedpan, or urinal?: A Little Help from another person bathing (including washing, rinsing, drying)?: A Little Help from another person to put on and taking off regular upper body clothing?: A Little Help from another person to put on and taking off regular lower body clothing?: A Little 6 Click Score: 18    End of Session Equipment Utilized During Treatment: Gait belt;Rolling walker  OT Visit Diagnosis: Unsteadiness on feet (R26.81);Other abnormalities of gait and mobility (R26.89);Muscle weakness (generalized) (M62.81);Repeated falls (R29.6);History of falling (Z91.81);Feeding difficulties (R63.3);Other symptoms and signs involving cognitive function   Activity Tolerance Patient tolerated treatment well   Patient Left in chair;with chair alarm set;with call bell/phone within reach   Nurse Communication Mobility status         Time: 2919-1660 OT Time Calculation (min): 23 min  Charges: OT General Charges $OT Visit: 1 Visit OT Treatments $Self Care/Home Management : 23-37 mins  [place]. Viney Acocella, COTA/L Acute Rehabilitation Services [place] 10/30/2019, 3:01 PM

## 2019-10-30 NOTE — Plan of Care (Signed)
Advised by Dr. Tana Coast that patient is awake enough to tolerate prep.  Will tentative plan colonoscopy tomorrow afternoon, if she can tolerate the prep.

## 2019-10-30 NOTE — Progress Notes (Addendum)
Patient assisted to bedside commode. This RN encouraged patient to sit in chair to be more alert today. Patient now sitting in chair with call light within reach. Patient A&O x4 and answers all questions appropriately. OT scheduled to see patient today and will walk patient in hallway. Andrea [name]

## 2019-10-31 ENCOUNTER — Encounter (HOSPITAL_COMMUNITY)
Disposition: A | Discharge status: Skilled Nursing Facility | Payer: Self-pay | Source: Home / Self Care | Attending: Internal Medicine

## 2019-10-31 LAB — GLUCOSE, CAPILLARY
Glucose-Capillary: 185 mg/dL — ABNORMAL HIGH (ref 70–99)
Glucose-Capillary: 90 mg/dL (ref 70–99)
Glucose-Capillary: 178 mg/dL — ABNORMAL HIGH (ref 70–99)
Glucose-Capillary: 81 mg/dL (ref 70–99)
Glucose-Capillary: 226 mg/dL — ABNORMAL HIGH (ref 70–99)

## 2019-10-31 LAB — BASIC METABOLIC PANEL
Anion gap: 9 (ref 5–15)
BUN: 7 mg/dL (ref 6–20)
CO2: 27 mmol/L (ref 22–32)
Calcium: 8.5 mg/dL — ABNORMAL LOW (ref 8.9–10.3)
Chloride: 105 mmol/L (ref 98–111)
Creatinine, Ser: 0.53 mg/dL (ref 0.44–1.00)
GFR calc Af Amer: >60 mL/min (ref >60)
GFR calc non Af Amer: >60 mL/min (ref >60)
Glucose, Bld: 134 mg/dL — ABNORMAL HIGH (ref 70–99)
Potassium: 3.4 mmol/L — ABNORMAL LOW (ref 3.5–5.1)
Sodium: 141 mmol/L (ref 135–145)

## 2019-10-31 SURGERY — COLONOSCOPY WITH PROPOFOL
Anesthesia: Monitor Anesthesia Care | Laterality: Left

## 2019-10-31 MED ORDER — ENSURE MAX PROTEIN PO LIQD
11.0000 [oz_av] | Freq: Three times a day (TID) | ORAL | Status: DC
  Administered 2019-10-31 – 2019-11-04 (×6): 11 [oz_av] via ORAL
  Filled 2019-10-31 (×17): qty 330

## 2019-10-31 MED ORDER — POTASSIUM CHLORIDE CRYS ER 20 MEQ PO TBCR
40.0000 meq | EXTENDED_RELEASE_TABLET | Freq: Once | ORAL | Status: AC
  Administered 2019-10-31: 40 meq via ORAL
  Filled 2019-10-31: qty 2

## 2019-10-31 MED ORDER — ADULT MULTIVITAMIN W/MINERALS CH
1.0000 | ORAL_TABLET | Freq: Every day | ORAL | Status: DC
  Administered 2019-10-31 – 2019-11-12 (×13): 1 via ORAL
  Filled 2019-10-31 (×13): qty 1

## 2019-10-31 NOTE — Progress Notes (Signed)
[facility] Gastroenterology Progress Note  Andrea [name] 59 y.o. 04/25/1961  CC:  Abnormal CT, concern for colonic neoplasm  Subjective: Patient is again somnolent and unable to carry on a conversation without falling asleep.  Denies any abdominal pain.  Per flowsheet, patient is having soft, brown stools.   ROS : Limited by somnolence.  Objective: Vital signs in last 24 hours: Vitals:   10/31/19 0309 10/31/19 0729  BP: 126/74 94/63  Pulse: (!) 102 98  Resp: 20 16  Temp: 98.8 F (37.1 C) 97.6 F (36.4 C)  SpO2: 100% 100%    Physical Exam:  General:  Somnolent, no acute distress  Head:  Normocephalic, without obvious abnormality, atraumatic  Eyes:   Anicteric sclera, EOMs intact  Lungs:   Clear to auscultation bilaterally, respirations unlabored  Heart:  Regular rate and rhythm, S1, S2 normal  Abdomen:   Soft, non-tender, nondistended, normoactive bowel sounds,  no guarding or peritoneal signs  Extremities: Extremities normal, atraumatic, no  edema  Pulses: 2+ and symmetric    Lab Results: Recent Labs    10/30/19 0327 10/31/19 0535  NA 139 141  K 4.3 3.4*  CL 103 105  CO2 28 27  GLUCOSE 117* 134*  BUN 5* 7  CREATININE 0.60 0.53  CALCIUM 8.5* 8.5*   No results for input(s): AST, ALT, ALKPHOS, BILITOT, PROT, ALBUMIN in the last 72 hours. Recent Labs    10/29/19 0737 10/30/19 0327  WBC 8.6 8.7  HGB 9.7* 10.2*  HCT 30.0* 31.5*  MCV 88.5 89.5  PLT 472* 466*   No results for input(s): LABPROT, INR in the last 72 hours.   Impression: Concern for colonic neoplasm based on CT findings and recent constipation and unintentional weight loss of 70 lbs. -CT 6/22 showed sigmoid wall thickening and pericolonic edema with large volume of stool: stercoral colitis vs. colonic neoplasm. -CEA normal (3.3) and Ca 19-9 normal (<2) -Mild normocytic anemia: Hgb 10.2, stable  DM type 1 complicated by hypoglycemia  Focal seizures, new-onset, now resolved.   Plan: Patient's  somnolence continues to limit prep intake.  Patient has consumed less than 1/3 of the first bottle of MoviPrep, and she is still having soft, brown stools (not prepped enough for colonoscopy at this time). We will revisit on Monday to see if patient is awake enough to tolerate the prep.   Salley Slaughter PA-C 10/31/2019, 9:29 AM   Contact #  [phone]

## 2019-10-31 NOTE — Progress Notes (Signed)
[facility]                                                                              Patient Demographics  Andrea [name], is a 59 y.o. female, DOB - 06/13/1960, ZGY:[id number]  Admit date - 10/19/2019   Admitting Physician Elmarie Shiley, MD  Outpatient Primary MD for the patient is Bright, Aaron Mose, MD  Outpatient specialists:   LOS - 11  days   Medical records reviewed and are as summarized below:    Chief Complaint  Patient presents with  . Code Stroke       Brief summary   Patient is a 59 year old female with history of insulin-dependent diabetes mellitus, hyperlipidemia, hypothyroidism who was brought to the emergency department for evaluation of hypoglycemia. She presented to the emergency department on 6/19 for similar problem and was discharged to home after correction ofhypoglycemia. When EMS arrived at home, patient had 2 focal seizures and was given IV Versed and glucose. On presentation she had mild left-sided weakness and dysarthria. She takes 70/30 insulin at home. On presentation, CT head did not show any acute abnormality but MRI findings are consistent with recent seizure activity. Neurology was consulted. Hospital course remarkable for severe constipation.she developed twitching of face muscles on 09/2319. She underwent LP by neurology suspecting for autoimmune/inflammatory disorder. Started on high-dose steroids. PT recommending SNF on DC    Assessment & Plan   Principal problem Acute metabolic encephalopathy -Likely secondary to hypoglycemia versus seizure -Seen this morning, alert and awake, encouraged out of bed to chair, increase mobility, PT -PT evaluation done today, recommended home health PT, supervision   Active problems Seizures -New onset, likely due to hypoglycemia however EEG also showed epileptiform discharges. -Neurology was consulted, patient started on Keppra and Clobazam -No abnormal enhancement seen on  MRI, again had facial muscle twitching on 6/23. -Neurology did an LP, started on Solu-Medrol for possible autoimmune/inflammatory disorder/paraneoplastic disorder.  Unclear etiology, CSF IgG cell count, HSV, myelin basic protein, VDRL normal.  CSF culture negative -Neurology recommended IV Solu-Medrol 1 g daily for 5 doses, completed on 6/27 -Autoimmune and paraneoplastic labs sent to [facility], still pending -Continued on high-dose Keppra and clobazam -Neurology recommended follow-up with Dr. Delice Lesch in 6 weeks and consider repeat MRI brain -No repeat seizures   Type I diabetic with hypoglycemia episodes, uncontrolled, IDDM -Reportedly on Novolin 70/30 at home 80 units in the morning and 85 units at night however having intermittent hypoglycemia episodes while inpatient -Unclear etiology for intermittent hypoglycemia,  Hemoglobin A1c 12.4 on 6/22 -C-peptide, pro-insulin level both low, patient is type I diabetic, random insulin level normal -TSH 1.3, albumin 2.4 -Registered dietitian consult, placed on prostat, Ensure nutritional supplements, encourage p.o. diet -Patient follows endocrinologist, Dr. Honor Junes -CBG stable, no other hypoglycemia episodes, however only on sliding scale insulin  Anorexia/weight loss, early satiety, abnormal CT scan -CT abdomen pelvis showed fecal impaction, bilateral hydroureteronephrosis due to distended rectum, approximately 6.3 cm sigmoid colonic wall thickening may represent focal colitis including stercoral colitis versus colonic neoplasm, large volume of stool throughout the remainder of colon --Extensive GI work-up outpatient with [facility] (  Dr [name]), had upper endoscopy on 04/09/2019 for the above complaints, which showed chronic gastritis with H. pylori.  No metaplasia or dysplasia or carcinoma. -She had a normal GE study on 04/17/2019 which showed no gastroparesis -Patient had flex sig 2016 which had shown diverticulosis with nonthrombosed external  hemorrhoids, polyps, otherwise benign. -CEA, CA 19-9, normal -Patient completed half bowel prep, GI planning to do colonoscopy tomorrow if she is able to complete it today.     Hypothyroidism -Follows with endocrinology, Dr. Dina Rich, continue Synthroid, Cytomel  Left upper lobe nodule, 3 mm incidental finding -Repeat CT chest in 3 months, past smoker  Severe constipation -CT abdomen/pelvis findings noted, placed on Senokot-S, MiraLAX daily  Generalized debility, deconditioning -PT evaluation recommended today recommended home health, 24-hour supervision, 3 n1, DME rolling walker  Severe protein calorie malnutrition Estimated body mass index is 18.71 kg/m as calculated from the following:   Height as of 10/18/19: 5\' 7"  (1.702 m).   Weight as of this encounter: 54.2 kg.  Code Status: Full CODE STATUS DVT Prophylaxis:  Lovenox  Family Communication: Discussed all imaging results, lab results, explained to the patient's granddaughter, Cassaundra Rasch on the phone.  Patient's granddaughter requesting rehab at the time of discharge. SW consult placed  Disposition Plan:     Status is: Inpatient  Remains inpatient appropriate because:Inpatient level of care appropriate due to severity of illness   Dispo: The patient is from: Home              Anticipated d/c is to: Home              Anticipated d/c date is: 2 days              Patient currently is not medically stable to d/c.  Hypoglycemia episodes stabilized, awaiting colonoscopy    Time Spent in minutes 25 minutes Procedures:  None  Consultants:   Gastroenterology  Antimicrobials:   Anti-infectives (From admission, onward)   None         Medications  Scheduled Meds: . aspirin EC  81 mg Oral Daily  . bisacodyl  10 mg Rectal Once  . cloBAZam  5 mg Oral BID  . enoxaparin (LOVENOX) injection  40 mg Subcutaneous Q24H  . feeding supplement (PRO-STAT SUGAR FREE 64)  30 mL Oral BID  . insulin aspart  0-6 Units  Subcutaneous Q4H  . levETIRAcetam  1,500 mg Oral BID  . levothyroxine  150 mcg Oral Q0600  . liothyronine  5 mcg Oral Daily  . multivitamin with minerals  1 tablet Oral Daily  . Ensure Max Protein  11 oz Oral TID  . rosuvastatin  20 mg Oral QHS  . senna-docusate  1 tablet Oral BID  . sodium chloride flush  3 mL Intravenous Once   Continuous Infusions: . sodium chloride 20 mL/hr at 10/30/19 0400   PRN Meds:.acetaminophen **OR** acetaminophen, hydrALAZINE, LORazepam, ondansetron (ZOFRAN) IV      Subjective:   Andrea [name] was seen and examined today.  Finished half of bowel prep yesterday, no fevers or chills, oriented.  No abdominal pain nausea vomiting.   Objective:   Vitals:   10/30/19 2319 10/31/19 0309 10/31/19 0729 10/31/19 1133  BP: 120/74 126/74 94/63 (!) 100/59  Pulse: 99 (!) 102 98 94  Resp: 19 20 16 20   Temp: 98.5 F (36.9 C) 98.8 F (37.1 C) 97.6 F (36.4 C) (!) 97.5 F (36.4 C)  TempSrc: Oral Oral Oral Oral  SpO2: 100% 100% 100%  100%  Weight:  54.2 kg      Intake/Output Summary (Last 24 hours) at 10/31/2019 1307 Last data filed at 10/31/2019 0500 Gross per 24 hour  Intake 360 ml  Output --  Net 360 ml     Wt Readings from Last 3 Encounters:  10/31/19 54.2 kg  10/18/19 53.5 kg  10/08/19 49.4 kg   Physical Exam  General: Sleepy but easily arousable and oriented  Cardiovascular: S1 S2 clear, RRR. No pedal edema b/l  Respiratory: CTAB, no wheezing, rales or rhonchi  Gastrointestinal: Soft, nontender, nondistended, NBS  Ext: no pedal edema bilaterally  Neuro: no new deficits  Musculoskeletal: No cyanosis, clubbing  Skin: No rashes  Psych: Sleepy but easily arousable and oriented    Data Reviewed:  I have personally reviewed following labs and imaging studies  Micro Results Recent Results (from the past 240 hour(s))  Stool culture (children & immunocomp patients)     Status: None   Collection Time: 10/21/19  2:34 PM   Specimen: Stool   Result Value Ref Range Status   Salmonella/Shigella Screen Final report  Final   Campylobacter Culture Final report  Final   E coli, Shiga toxin Assay Negative Negative Final    Comment: (NOTE) Performed At: [facility] [address] [place], [place] [id number] Rush Farmer MD CL:[id number]   OVA + PARASITE EXAM     Status: None   Collection Time: 10/21/19  2:34 PM   Specimen: Stool  Result Value Ref Range Status   OVA + PARASITE EXAM Final report  Final    Comment: (NOTE) These results were obtained using wet preparation(s) and trichrome stained smear. This test does not include testing for Cryptosporidium parvum, Cyclospora, or Microsporidia. Performed At: [place], [place] [id number] Caprice Red MD MB:[id number]    Source of Sample STOOL  Final    Comment: Performed at [facility], [place] [address], [place], [place] [address]  STOOL CULTURE REFLEX - RSASHR     Status: None   Collection Time: 10/21/19  2:34 PM  Result Value Ref Range Status   Stool Culture result 1 (RSASHR) Comment  Final    Comment: (NOTE) No Salmonella or Shigella recovered. Performed At: [facility] [address] [place], [place] [id number] Rush Farmer MD RA:[id number]   STOOL CULTURE Reflex - CMPCXR     Status: None   Collection Time: 10/21/19  2:34 PM  Result Value Ref Range Status   Stool Culture result 1 (CMPCXR) Comment  Final    Comment: (NOTE) No Campylobacter species isolated. Performed At: [facility] [place], [place] [id number] Rush Farmer MD [id number]   CSF culture with Stat gram stain     Status: None   Collection Time: 10/22/19  3:15 PM   Specimen: CSF; Cerebrospinal Fluid  Result Value Ref Range Status   Specimen Description CSF  Final   Special Requests NONE  Final   Gram Stain   Final    WBC PRESENT, PREDOMINANTLY PMN NO ORGANISMS SEEN CYTOSPIN SMEAR    Culture   Final     NO GROWTH 3 DAYS Performed at [facility], [address] [place], [place] [address]    Report Status 10/25/2019 FINAL  Final    Radiology Reports CT CHEST W CONTRAST  Result Date: 10/23/2019 CLINICAL DATA:  Seizure, abnormal CT abdomen with basilar lung changes EXAM: CT CHEST WITH CONTRAST TECHNIQUE: Multidetector CT imaging of  the chest was performed during intravenous contrast administration. Sagittal and coronal MPR images reconstructed from axial data set. CONTRAST:  28mL OMNIPAQUE IOHEXOL 300 MG/ML  SOLN IV COMPARISON:  CT abdomen and pelvis 10/21/2019 FINDINGS: Cardiovascular: Atherosclerotic calcification aorta, proximal great vessels and coronary arteries. Aorta normal caliber. Heart normal size. No pericardial effusion. Mediastinum/Nodes: Esophagus unremarkable. Base of cervical region normal appearance. No thoracic adenopathy. Lungs/Pleura: Linear areas of atelectasis versus scarring scattered in both lungs. Patchy areas of peribronchovascular density are seen in both lungs, some associated with minimal bronchiectasis, favor post inflammatory. Central peribronchial thickening present. No segmental consolidation, pleural effusion or pneumothorax. 3 mm LEFT upper lobe nodule image 30. No additional pulmonary mass/nodule. Upper Abdomen: Stomach distended by food debris. Remaining upper abdomen unremarkable Musculoskeletal: No osseous abnormalities. Scattered chest wall and axillary soft tissue edema, nonspecific. IMPRESSION: Scattered areas of atelectasis versus scarring in both lungs with few scattered areas of peribronchovascular opacity bilaterally, a few of which are associated with minimal bronchiectasis, favor postinflammatory scarring. Single 3 mm nonspecific LEFT upper lobe nodule. Short-term follow-up CT recommended in 3 months to reassess the areas of suspected scarring to ensure stability. Aortic Atherosclerosis (ICD10-I70.0). Electronically Signed   By: Lavonia Dana M.D.   On:  10/23/2019 13:00   MR BRAIN WO CONTRAST  Result Date: 10/19/2019 CLINICAL DATA:  Stroke follow-up. Left-sided weakness. Hypoglycemia. Witnessed seizures by EMS. Clinically suspected Todd's paralysis. EXAM: MRI HEAD WITHOUT CONTRAST TECHNIQUE: Multiplanar, multiecho pulse sequences of the brain and surrounding structures were obtained without intravenous contrast. COMPARISON:  Head CT 10/19/2019 FINDINGS: The study is mildly motion degraded. Brain: There is mild restricted diffusion involving cortex in the medial right frontal lobe, right insula, and likely right hippocampus which does not conform to a vascular territory and is without associated edema. No intracranial hemorrhage, mass, midline shift, or extra-axial fluid collection is identified. No significant white matter disease is seen for age. The ventricles and sulci are normal. Vascular: Major intracranial vascular flow voids are preserved. Skull and upper cervical spine: Unremarkable bone marrow signal para Sinuses/Orbits: Bilateral cataract extraction. Small mucous retention cysts in the maxillary sinuses. Clear mastoid air cells. Other: None. IMPRESSION: Mild restricted diffusion in the medial right frontal lobe, right insula, and likely right hippocampus most consistent with recent seizure activity. Electronically Signed   By: Logan Bores M.D.   On: 10/19/2019 16:48   MR BRAIN W CONTRAST  Result Date: 10/20/2019 CLINICAL DATA:  Seizure.  Abnormal MRI. EXAM: MRI HEAD WITH CONTRAST TECHNIQUE: Multiplanar, multiecho pulse sequences of the brain and surrounding structures were obtained with intravenous contrast. CONTRAST:  32mL GADAVIST GADOBUTROL 1 MMOL/ML IV SOLN COMPARISON:  MRI head without contrast 05/21/2019 FINDINGS: Brain: Review of the MRI from yesterday reveals restricted diffusion in the right medial frontal cortex, also in the right insula and right hippocampus. Note that the lesions are best seen on diffusion-weighted imaging with no  significant associated FLAIR cortical edema. These findings are unilateral. No abnormal enhancement on today's study. Ventricle size is normal. Diffusion-weighted imaging was not repeated today IMPRESSION: Normal enhancement of the brain. Based on the findings yesterday, differential includes seizure related cortical activity, autoimmune related encephalitis, and less likely viral encephalitis. Consider lumbar puncture. Short-term follow-up MRI with contrast may be helpful. Electronically Signed   By: Franchot Gallo M.D.   On: 10/20/2019 16:40   CT ABDOMEN PELVIS W CONTRAST  Result Date: 10/22/2019 CLINICAL DATA:  Unintended weight loss. EXAM: CT ABDOMEN AND PELVIS WITH CONTRAST TECHNIQUE: Multidetector  CT imaging of the abdomen and pelvis was performed using the standard protocol following bolus administration of intravenous contrast. CONTRAST:  163mL OMNIPAQUE IOHEXOL 300 MG/ML  SOLN COMPARISON:  None. FINDINGS: Lower chest: Majority of the chest is included in the field of view. Scattered linear and bandlike opacities throughout both lungs are typical of scarring. There is a subpleural 5 mm left upper lobe pulmonary nodule, series 6, image 26. Scattered peribronchovascular irregular opacities in the right lower lobe series 6, image 53, right middle lobe, series 6, image 39, left lower lobe, same image, left upper lobe series 6, image 29, and right mid lung same image. No dominant pulmonary mass. Hepatobiliary: Scattered subcentimeter hypodensities in the liver are too small to characterize but likely small cysts or biliary hamartomas. No evidence of suspicious liver lesion. Gallbladder physiologically distended, no calcified stone. No biliary dilatation. Pancreas: No ductal dilatation or inflammation. Spleen: Normal in size without focal abnormality. Adrenals/Urinary Tract: Normal adrenal glands. There is right hydroureteronephrosis. The ureter is dilated to pelvic brim. There is mild left hydronephrosis and  proximal hydroureter, with similar ureteral dilatation. Diminished excretion on delayed phase imaging, right greater than left. No evidence of focal renal mass. Urinary bladder is displaced anteriorly by large stool ball in the rectum. Stomach/Bowel: Large inspissated stool ball distends the rectum with rectal distention of 8.6 cm. Associated rectal wall thickening and perirectal edema. No pneumatosis. Approximately 6.3 cm length area of sigmoid colonic wall thickening with pericolonic edema just proximal to the large volume of rectosigmoid stool. Large volume of stool throughout the remainder of the colon, there is significant sigmoid colonic redundancy. No additional areas of colonic wall thickening. The appendix is not confidently visualized. The stomach is unremarkable. No small bowel obstruction or abnormal distention. No definite small bowel inflammation. Vascular/Lymphatic: Moderate aorto bi-iliac atherosclerosis, advanced for age. No aortic aneurysm. Patent portal vein. There is no adenopathy in the abdomen or pelvis. Reproductive: Calcified uterine fibroids. The uterus is displaced into the right pelvis and anteriorly by large rectosigmoid stool burden. No evidence of adnexal mass. Other: Perirectal and distal pericolonic edema with small amount of presacral fluid. No other ascites. There is no free air or perforation. Mild generalized body wall edema. Musculoskeletal: Mild L1 superior endplate compression fracture, age indeterminate. No evidence of focal bone lesion. IMPRESSION: 1. Large inspissated stool ball distends the rectum with rectal distention of 8.6 cm. Associated rectal wall thickening and perirectal edema, suspicious for fecal impaction. The stool distended rectum causes mass effect on the urinary bladder, uterus, and distal ureters with bilateral hydroureteronephrosis, right greater than left. 2. Approximately 6.3 cm length area of sigmoid colonic wall thickening with pericolonic edema just  proximal to the large volume of rectosigmoid stool. This may represent focal colitis, including stercoral colitis versus colonic neoplasm. 3. Large volume of stool throughout the remainder of the colon. There is significant sigmoid colonic redundancy. 4. Scattered peribronchovascular irregular opacities in the lung bases, likely infectious or inflammatory. There is a 5 mm subpleural left upper lobe pulmonary nodule. No follow-up needed if patient is low-risk (and has no known or suspected primary neoplasm). Non-contrast chest CT can be considered in 12 months if patient is high-risk. This recommendation follows the consensus statement: Guidelines for Management of Incidental Pulmonary Nodules Detected on CT Images: From the Fleischner Society 2017; Radiology 2017; 284:228-243. 5. Mild L1 superior endplate compression fracture, age indeterminate. Aortic Atherosclerosis (ICD10-I70.0). Electronically Signed   By: Keith Rake M.D.   On: 10/22/2019 00:00  DG CHEST PORT 1 VIEW  Result Date: 10/19/2019 CLINICAL DATA:  59 year old female with left-sided weakness. Concern for stroke. EXAM: PORTABLE CHEST 1 VIEW COMPARISON:  Chest radiograph dated 01/14/2005. FINDINGS: No focal consolidation, pleural effusion or pneumothorax. The cardiac silhouette is within limits. No acute osseous pathology. IMPRESSION: No active disease. Electronically Signed   By: Anner Crete M.D.   On: 10/19/2019 19:28   EEG adult  Result Date: 10/19/2019 Lora Havens, MD     10/19/2019  8:23 PM Patient Name: Ivory Maduro MRN: [id number] Epilepsy Attending: Lora Havens Referring Physician/Provider: Dr Amie Portland Date: 10/19/2019 Duration: 30.57 mins Patient history: 59yo F who presented with ams and seizure. EEG to evaluate for seizure, Level of alertness: Awake AEDs during EEG study:  keppra Technical aspects: This EEG study was done with scalp electrodes positioned according to the 10-20 International system of electrode  placement. Electrical activity was acquired at a sampling rate of 500Hz  and reviewed with a high frequency filter of 70Hz  and a low frequency filter of 1Hz . EEG data were recorded continuously and digitally stored. Description: No posterior dominant rhythm was seen. EEG showed generalize, maximal bifrontal periodic epileptiform discharges with triphasic morphology at 0.25-0.5Hz . EEG also showed continuous generalized 8-9Hz  alpha activity.  Hyperventilation and photic stimulation were not performed.   ABNORMALITY -Periodic epileptiform discharges with triphasic morphology, generalized, maximal bifrontal IMPRESSION: This study showed evidence of generalized epileptogenicity with epileptiform discharges at 0.25-0.5hz  which is on the ictal-interictal continuum. No definite seizures were seen throughout the recording. Dr Cheral Marker was notified. Priyanka Barbra Sarks   Overnight EEG with video  Result Date: 10/20/2019 Lora Havens, MD     10/21/2019  1:03 PM Patient Name: Veeda Virgo MRN: [id number] Epilepsy Attending: Lora Havens Referring Physician/Provider: Dr Milus Banister Duration: 10/19/2019 2027 to 10/20/2019 2027  Patient history: 59yo F who presented with ams and seizure. EEG to evaluate for seizure,  Level of alertness: Awake  AEDs during EEG study:  keppra  Technical aspects: This EEG study was done with scalp electrodes positioned according to the 10-20 International system of electrode placement. Electrical activity was acquired at a sampling rate of 500Hz  and reviewed with a high frequency filter of 70Hz  and a low frequency filter of 1Hz . EEG data were recorded continuously and digitally stored.  Description: No posterior dominant rhythm was seen. EEG initially showed generalized, maximal bifrontal periodic epileptiform discharges with triphasic morphology at 0.25-0.5Hz . Gradually, eeg showed right frontocentral periodic epileptiform discharges at 1Hz . EEG also showed continuous generalized 8-9Hz   alpha activity. Event button was pressed multiple times during which patient was noted to have left side face twitching, lasting about 30 seconds to one minute. Concomitant eeg showed rhythmic sharply contoured 5-9h generalized theta-alpha activity which evolved into 3-5Hz  theta-delta activity.  ABNORMALITY - Seizure, generalized - Periodic epileptiform discharges with triphasic morphology, generalized, maximal bifrontal - Continuous slow, generalized  IMPRESSION: This study showed multiple seizures with generalized onset during which patient was noted to have left face twitching, lasting 30 seconds to one minute. Additionally, there are epileptiform discharges at 0.25-0.5hz  which is on the ictal-interictal continuum as well as moderate diffuse encephalopathy, non specific to etiology.  Lora Havens   CT HEAD CODE STROKE WO CONTRAST  Result Date: 10/19/2019 CLINICAL DATA:  Code stroke.  Left-sided weakness. EXAM: CT HEAD WITHOUT CONTRAST TECHNIQUE: Contiguous axial images were obtained from the base of the skull through the vertex without intravenous contrast. COMPARISON:  None. FINDINGS: The study is  mildly motion degraded. Brain: Within limitations of motion, no acute infarct, intracranial hemorrhage, mass, midline shift, or extra-axial fluid collection is identified. The ventricles and sulci are normal. Vascular: Calcified atherosclerosis at the skull base. No hyperdense vessel. Skull: No fracture or suspicious osseous lesion. Sinuses/Orbits: Partially visualized mild mucosal thickening in the left maxillary sinus. Bilateral cataract extraction. Other: None. ASPECTS [facility] Stroke Program Early CT Score) - Ganglionic level infarction (caudate, lentiform nuclei, internal capsule, insula, M1-M3 cortex): 7 - Supraganglionic infarction (M4-M6 cortex): 3 Total score (0-10 with 10 being normal): 10 IMPRESSION: 1. No evidence of acute intracranial abnormality within limitations of mild motion. 2. ASPECTS  is 10. These results were communicated to Dr. Rory Percy at 3:50 pm on 10/19/2019 by text page via the [facility] messaging system. Electronically Signed   By: Logan Bores M.D.   On: 10/19/2019 15:50    Lab Data:  CBC: Recent Labs  Lab 10/25/19 0354 10/27/19 0240 10/29/19 0737 10/30/19 0327  WBC 8.6 8.5 8.6 8.7  NEUTROABS 7.1 6.5  --   --   HGB 9.0* 9.6* 9.7* 10.2*  HCT 27.0* 29.4* 30.0* 31.5*  MCV 86.0 87.0 88.5 89.5  PLT 445* 491* 472* 099*   Basic Metabolic Panel: Recent Labs  Lab 10/27/19 0240 10/28/19 0343 10/29/19 0737 10/30/19 0327 10/31/19 0535  NA 141 139 141 139 141  K 3.5 3.1* 3.6 4.3 3.4*  CL 105 105 104 103 105  CO2 22 26 28 28 27   GLUCOSE 153* 56* 75 117* 134*  BUN 12 13 7  5* 7  CREATININE 0.79 0.59 0.59 0.60 0.53  CALCIUM 8.7* 8.4* 8.3* 8.5* 8.5*   GFR: Estimated Creatinine Clearance: 64.8 mL/min (by C-G formula based on SCr of 0.53 mg/dL). Liver Function Tests: No results for input(s): AST, ALT, ALKPHOS, BILITOT, PROT, ALBUMIN in the last 168 hours. No results for input(s): LIPASE, AMYLASE in the last 168 hours. No results for input(s): AMMONIA in the last 168 hours. Coagulation Profile: No results for input(s): INR, PROTIME in the last 168 hours. Cardiac Enzymes: No results for input(s): CKTOTAL, CKMB, CKMBINDEX, TROPONINI in the last 168 hours. BNP (last 3 results) No results for input(s): PROBNP in the last 8760 hours. HbA1C: No results for input(s): HGBA1C in the last 72 hours. CBG: Recent Labs  Lab 10/30/19 2007 10/30/19 2316 10/31/19 0305 10/31/19 0728 10/31/19 1132  GLUCAP 214* 112* 185* 90 178*   Lipid Profile: No results for input(s): CHOL, HDL, LDLCALC, TRIG, CHOLHDL, LDLDIRECT in the last 72 hours. Thyroid Function Tests: No results for input(s): TSH, T4TOTAL, FREET4, T3FREE, THYROIDAB in the last 72 hours. Anemia Panel: No results for input(s): VITAMINB12, FOLATE, FERRITIN, TIBC, IRON, RETICCTPCT in the last 72 hours. Urine  analysis:    Component Value Date/Time   COLORURINE COLORLESS (A) 10/08/2019 1848   APPEARANCEUR CLEAR (A) 10/08/2019 1848   LABSPEC 1.017 10/08/2019 1848   PHURINE 6.0 10/08/2019 1848   GLUCOSEU >=500 (A) 10/08/2019 1848   HGBUR NEGATIVE 10/08/2019 1848   BILIRUBINUR NEGATIVE 10/08/2019 1848   KETONESUR NEGATIVE 10/08/2019 1848   PROTEINUR NEGATIVE 10/08/2019 1848   NITRITE NEGATIVE 10/08/2019 1848   LEUKOCYTESUR NEGATIVE 10/08/2019 1848     Burnice Vassel M.D. [facility] 10/31/2019, 1:07 PM   Call night coverage person covering after 7pm

## 2019-10-31 NOTE — Progress Notes (Signed)
Physical Therapy Treatment Patient Details Name: Andrea [name] MRN: [id number] DOB: 1960-11-19 Today's Date: 10/31/2019    History of Present Illness 59 y.o. female with past medical history significant for diabetes insulin-dependent, hyperlipidemia, hypothyroidism, who was just evaluated at [facility], ER on 6/19 for hypoglycemia of 24, patient was initially nonverbal with generalized weakness at that time.  Hypoglycemia was corrected and she was discharged home.  Today family called EMS because patient's blood sugar was again low at 30.  Patient was also noted to have  left-sided weakness.  Patient was last seen normal at 2 AM.  When EMS was on the scene patient had 2 focal seizures.  Patient received IV Versed and glucose.  On arrival to the ED patient had still mild left-sided weakness and dysarthria.    PT Comments    Patient seen for mobility progression. Pt awake, alert, with flat affect but appropriate throughout. Pt tolerated gait distance of ~200 ft with RW and supervision-min guard assist. Cues for navigating environment and safety. Current plan remains appropriate.    Follow Up Recommendations  Home health PT;Supervision/Assistance - 24 hour     Equipment Recommendations  3in1 (PT);Rolling walker with 5" wheels;Other (comment) (pt reports having rollator at home?)    Recommendations for Other Services       Precautions / Restrictions Precautions Precautions: Fall Precaution Comments: seizure    Mobility  Bed Mobility Overal bed mobility: Modified Independent Bed Mobility: Supine to Sit;Sit to Supine              Transfers Overall transfer level: Needs assistance Equipment used: Rolling walker (2 wheeled);None Transfers: Sit to/from [employer] to Stand: Supervision Stand pivot transfers: Supervision       General transfer comment: pt able to stand pivot transfer bed to [facility] without AD   Ambulation/Gait Ambulation/Gait assistance: Min  guard;Supervision Gait Distance (Feet): 200 Feet Assistive device: Rolling walker (2 wheeled) Gait Pattern/deviations: Step-through pattern;Decreased stride length Gait velocity: Decreased   General Gait Details: decreased cadence; cues to keep RW close; overall steady gait however cues to navigate environment needed due to tendency to run into objects   Stairs             Wheelchair Mobility    Modified Rankin (Stroke Patients Only) Modified Rankin (Stroke Patients Only) Pre-Morbid Rankin Score: No symptoms Modified Rankin: Moderately severe disability     Balance Overall balance assessment: Needs assistance Sitting-balance support: Feet supported Sitting balance-Leahy Scale: Fair       Standing balance-Leahy Scale: Fair                              Cognition Arousal/Alertness: Awake/alert Behavior During Therapy: Flat affect Overall Cognitive Status: No family/caregiver present to determine baseline cognitive functioning                                 General Comments: Slow processing      Exercises      General Comments        Pertinent Vitals/Pain Pain Assessment: No/denies pain    Home Living                      Prior Function            PT Goals (current goals can now be found in the care plan section) Progress towards  PT goals: Progressing toward goals    Frequency    Min 3X/week      PT Plan Current plan remains appropriate    Co-evaluation              AM-PAC PT "6 Clicks" Mobility   Outcome Measure  Help needed turning from your back to your side while in a flat bed without using bedrails?: None Help needed moving from lying on your back to sitting on the side of a flat bed without using bedrails?: None Help needed moving to and from a bed to a chair (including a wheelchair)?: A Little Help needed standing up from a chair using your arms (e.g., wheelchair or bedside chair)?: A  Little Help needed to walk in hospital room?: A Little Help needed climbing 3-5 steps with a railing? : A Little 6 Click Score: 20    End of Session Equipment Utilized During Treatment: Gait belt Activity Tolerance: Patient tolerated treatment well Patient left: in bed;with call bell/phone within reach;with bed alarm set Nurse Communication: Mobility status PT Visit Diagnosis: Unsteadiness on feet (R26.81);Other abnormalities of gait and mobility (R26.89);Muscle weakness (generalized) (M62.81);Other symptoms and signs involving the nervous system (R29.898)     Time: 1610-9604 PT Time Calculation (min) (ACUTE ONLY): 27 min  Charges:  $Gait Training: 23-37 mins                     Earney Navy, PTA Acute Rehabilitation Services Pager: [phone] Office: [phone]     Darliss Cheney 10/31/2019, 4:35 PM

## 2019-10-31 NOTE — NC FL2 (Signed)
[place] LEVEL OF CARE SCREENING TOOL     IDENTIFICATION  Patient Name: Andrea [name] Birthdate: 1960/08/25 Sex: female Admission Date (Current Location): 10/19/2019  [facility] and [place] Number:  Engineering geologist and Address:  [facility] [place] [address], [place], [place] [address]      Provider Number: [id number]  Attending Physician Name and Address:  Mendel Corning, MD  Relative Name and Phone Number:       Current Level of Care: Hospital Recommended Level of Care: [place] Prior Approval Number:    Date Approved/Denied:   PASRR Number: [id number]  Discharge Plan: SNF    Current Diagnoses: Patient Active Problem List   Diagnosis Date Noted   Seizure ([place]) 10/19/2019   Hypoglycemia 10/19/2019   Hypokalemia 10/19/2019   Back pain 06/14/2017   Fibroids 06/14/2017   Herpes simplex 06/14/2017   Hypertension associated with diabetes ([place]) 06/14/2017   Hypothyroid 06/14/2017   Menometrorrhagia 06/14/2017   Ovarian failure 09/22/2016   Colon polyps 10/29/2014   Left foot pain 04/15/2014   Abnormal uterine bleeding 02/24/2014   Cervical polyp 10/16/2013   High risk medication use 10/16/2013   Hyperlipidemia 10/16/2013   Increased frequency of urination 10/16/2013   Type 1 diabetes mellitus with other specified complication ([place]) 45/80/9983   Diabetic keto-acidosis ([place]) 01/01/2013   Graves disease 01/01/2013   Hyperglycemia 01/01/2013   Urine ketones 01/01/2013   Annual physical exam 08/15/2012   Pigmented skin lesions 08/15/2012    Orientation RESPIRATION BLADDER Height & Weight     Self, Time, Situation, Place  Normal Incontinent Weight: 119 lb 7.8 oz (54.2 kg) Height:     BEHAVIORAL SYMPTOMS/MOOD NEUROLOGICAL BOWEL NUTRITION STATUS    Convulsions/Seizures Incontinent Diet (see DC summary)  AMBULATORY STATUS COMMUNICATION OF NEEDS Skin   Extensive Assist Verbally Normal                        Personal Care Assistance Level of Assistance  Bathing, Feeding, Dressing Bathing Assistance: Maximum assistance Feeding assistance: Independent Dressing Assistance: Maximum assistance     Functional Limitations Info             SPECIAL CARE FACTORS FREQUENCY  PT (By licensed PT), OT (By licensed OT), Speech therapy     PT Frequency: 5x/wk OT Frequency: 5x/wk     Speech Therapy Frequency: 5x/wk      Contractures Contractures Info: Not present    Additional Factors Info  Code Status, Allergies, Insulin Sliding Scale, Psychotropic Code Status Info: Full Code Allergies Info: Latex Psychotropic Info: cloBAZam (ONFI) tablet 5 mg 2x daily PO Insulin Sliding Scale Info: 0-6 units 3x/day with meals       Current Medications (10/31/2019):  This is the current hospital active medication list Current Facility-Administered Medications  Medication Dose Route Frequency Provider Last Rate Last Admin   0.9 %  sodium chloride infusion   Intravenous Continuous Salley Slaughter, PA-C 20 mL/hr at 10/30/19 0400 Rate Verify at 10/30/19 0400   acetaminophen (TYLENOL) tablet 650 mg  650 mg Oral Q6H PRN Regalado, Belkys A, MD       Or   acetaminophen (TYLENOL) suppository 650 mg  650 mg Rectal Q6H PRN Regalado, Belkys A, MD       aspirin EC tablet 81 mg  81 mg Oral Daily Regalado, Belkys A, MD   81 mg at 10/31/19 1116   bisacodyl (DULCOLAX) suppository 10 mg  10  mg Rectal Once Arta Silence, MD       cloBAZam (ONFI) tablet 5 mg  5 mg Oral BID Lora Havens, MD   5 mg at 10/31/19 1116   enoxaparin (LOVENOX) injection 40 mg  40 mg Subcutaneous Q24H Regalado, Belkys A, MD   40 mg at 10/30/19 2017   feeding supplement (PRO-STAT SUGAR FREE 64) liquid 30 mL  30 mL Oral BID Rai, Ripudeep K, MD   30 mL at 10/31/19 1115   hydrALAZINE (APRESOLINE) injection 10 mg  10 mg Intravenous Q6H PRN Regalado, Belkys A, MD       insulin aspart (novoLOG) injection 0-6  Units  0-6 Units Subcutaneous Q4H Rai, Ripudeep K, MD   1 Units at 10/31/19 1243   levETIRAcetam (KEPPRA) tablet 1,500 mg  1,500 mg Oral BID Shelly Coss, MD   1,500 mg at 10/31/19 1115   levothyroxine (SYNTHROID) tablet 150 mcg  150 mcg Oral Q0600 Efraim Kaufmann, RPH   150 mcg at 10/31/19 0510   liothyronine (CYTOMEL) tablet 5 mcg  5 mcg Oral Daily Regalado, Belkys A, MD   5 mcg at 10/31/19 0510   LORazepam (ATIVAN) injection 2 mg  2 mg Intravenous PRN Lora Havens, MD       multivitamin with minerals tablet 1 tablet  1 tablet Oral Daily Rai, Ripudeep K, MD   1 tablet at 10/31/19 1447   ondansetron (ZOFRAN) injection 4 mg  4 mg Intravenous Q6H PRN Regalado, Belkys A, MD   4 mg at 10/19/19 1926   protein supplement (ENSURE MAX) liquid  11 oz Oral TID Rai, Ripudeep K, MD       rosuvastatin (CRESTOR) tablet 20 mg  20 mg Oral QHS Rai, Ripudeep K, MD   20 mg at 10/30/19 2207   senna-docusate (Senokot-S) tablet 1 tablet  1 tablet Oral BID Rai, Ripudeep K, MD   1 tablet at 10/31/19 1115   sodium chloride flush (NS) 0.9 % injection 3 mL  3 mL Intravenous Once Regalado, Belkys A, MD         Discharge Medications: Please see discharge summary for a list of discharge medications.  Relevant Imaging Results:  Relevant Lab Results:   Additional Information SS#: [id number]  Alexander Mt, LCSW

## 2019-10-31 NOTE — Progress Notes (Signed)
Initial Nutrition Assessment  DOCUMENTATION CODES:   Not applicable  INTERVENTION:  D/c Ensure Enlive   Once diet is advanced, provide: Ensure Max po TID, each supplement provides 150 kcal and 30 grams of protein  MVI daily  Continue 39ml Pro-stat BID, each supplement provides 100 kcal and 15 grams of protein  NUTRITION DIAGNOSIS:   Inadequate oral intake related to lethargy/confusion as evidenced by energy intake < 75% for > 7 days.    GOAL:   Patient will meet greater than or equal to 90% of their needs    MONITOR:   PO intake, Supplement acceptance, Labs, I & O's, Diet advancement, Weight trends  REASON FOR ASSESSMENT:   Consult Assessment of nutrition requirement/status  ASSESSMENT:   Pt presented with hypoglycemia, then had 2 focal seizures and was admitted. Hospital course remarkable for severe constipation, LP suspicious of autoimmune/inflammatory disorder, and an abnormal CT concerning for colonic neoplasm. PMH includes DM, HLD, hypothyroidism.  Pt unable to answer RD questions at this time. Pt noted to be somnolent and unable to carry on a conversation without falling asleep. This is preventing pt from consuming enough prep to be able to proceed with a colonoscopy.   Per wt readings, pt with an 18.5% wt loss x3 months, which is significant for time frame. Suspect pt is malnourished; however, unable to diagnose at this time without detailed diet history and/or nutrition-focused physical exam.   PO Intake: 0-100% x 6 recorded meals (52% average meal intake)  Labs: K+ 3.4 (L), CBGs 90-214 (Diabetes Coordinator following) Medications: Dulcolax, Ensure Enlive BID, 68ml Pro-stat BID, Novolog, Senokot-S  NUTRITION - FOCUSED PHYSICAL EXAM:  Unable to perform at this time, will attempt at follow-up.   Diet Order:   Diet Order            Diet NPO time specified  Diet effective midnight           Diet Carb Modified                 EDUCATION NEEDS:    No education needs have been identified at this time  Skin:  Skin Assessment: Reviewed RN Assessment  Last BM:  7/1  Height:   Ht Readings from Last 1 Encounters:  10/18/19 5\' 7"  (1.702 m)    Weight:  Wt Readings from Last 7 Encounters:  10/31/19 54.2 kg  10/18/19 53.5 kg  10/08/19 49.4 kg  08/16/19 66.7 kg  12/16/18 66.7 kg  10/29/18 66.7 kg  06/27/17 86.2 kg    BMI:  Body mass index is 18.71 kg/m.  Estimated Nutritional Needs:   Kcal:  1700-1900  Protein:  85-100 grams  Fluid:  >1.7L/d    Andrea Ina, MS, RD, LDN RD pager number and weekend/on-call pager number located in [place].

## 2019-11-01 LAB — GLUCOSE, CAPILLARY
Glucose-Capillary: 129 mg/dL — ABNORMAL HIGH (ref 70–99)
Glucose-Capillary: 140 mg/dL — ABNORMAL HIGH (ref 70–99)
Glucose-Capillary: 141 mg/dL — ABNORMAL HIGH (ref 70–99)
Glucose-Capillary: 142 mg/dL — ABNORMAL HIGH (ref 70–99)
Glucose-Capillary: 201 mg/dL — ABNORMAL HIGH (ref 70–99)
Glucose-Capillary: 239 mg/dL — ABNORMAL HIGH (ref 70–99)
Glucose-Capillary: 99 mg/dL (ref 70–99)

## 2019-11-01 MED ORDER — POLYETHYLENE GLYCOL 3350 17 GM/SCOOP PO POWD
1.0000 | Freq: Once | ORAL | Status: AC
  Administered 2019-11-01: 238 g via ORAL
  Filled 2019-11-01: qty 255

## 2019-11-01 MED ORDER — INSULIN ASPART 100 UNIT/ML ~~LOC~~ SOLN
0.0000 [IU] | SUBCUTANEOUS | Status: DC
  Administered 2019-11-01 – 2019-11-02 (×3): 2 [IU] via SUBCUTANEOUS
  Administered 2019-11-02: 1 [IU] via SUBCUTANEOUS
  Administered 2019-11-03 (×4): 2 [IU] via SUBCUTANEOUS
  Administered 2019-11-03 (×2): 1 [IU] via SUBCUTANEOUS
  Administered 2019-11-04 (×4): 2 [IU] via SUBCUTANEOUS
  Administered 2019-11-04: 4 [IU] via SUBCUTANEOUS
  Administered 2019-11-04: 1 [IU] via SUBCUTANEOUS
  Administered 2019-11-05: 3 [IU] via SUBCUTANEOUS
  Administered 2019-11-05: 1 [IU] via SUBCUTANEOUS
  Administered 2019-11-05: 3 [IU] via SUBCUTANEOUS

## 2019-11-01 NOTE — Progress Notes (Signed)
Subjective: Patient seen and examined at bedside in presence of her daughter [name]. She was able to finish the movie prep today and has had several bowel movements that are loose but not clear as yet.  Objective: Vital signs in last 24 hours: Temp:  [98.1 F (36.7 C)-99.1 F (37.3 C)] 98.1 F (36.7 C) (07/03 1203) Pulse Rate:  [82-107] 94 (07/03 1203) Resp:  [14-20] 20 (07/03 1203) BP: (105-113)/(61-74) 105/62 (07/03 1203) SpO2:  [100 %] 100 % (07/03 1203) Weight change:  Last BM Date: 10/31/19  PE: Awake, oriented to time, place and person GENERAL: Mild pallor, no icterus ABDOMEN: Soft, nondistended, nontender, normoactive bowel sounds EXTREMITIES: No evidence of pedal edema  Lab Results: Results for orders placed or performed during the hospital encounter of 10/19/19 (from the past 48 hour(s))  Glucose, capillary     Status: Abnormal   Collection Time: 10/30/19  3:15 PM  Result Value Ref Range   Glucose-Capillary 191 (H) 70 - 99 mg/dL    Comment: Glucose reference range applies only to samples taken after fasting for at least 8 hours.  Glucose, capillary     Status: Abnormal   Collection Time: 10/30/19  8:07 PM  Result Value Ref Range   Glucose-Capillary 214 (H) 70 - 99 mg/dL    Comment: Glucose reference range applies only to samples taken after fasting for at least 8 hours.  Glucose, capillary     Status: Abnormal   Collection Time: 10/30/19 11:16 PM  Result Value Ref Range   Glucose-Capillary 112 (H) 70 - 99 mg/dL    Comment: Glucose reference range applies only to samples taken after fasting for at least 8 hours.   Comment 1 Notify RN    Comment 2 Document in Chart   Glucose, capillary     Status: Abnormal   Collection Time: 10/31/19  3:05 AM  Result Value Ref Range   Glucose-Capillary 185 (H) 70 - 99 mg/dL    Comment: Glucose reference range applies only to samples taken after fasting for at least 8 hours.   Comment 1 Notify RN    Comment 2 Document in Chart    Basic metabolic panel     Status: Abnormal   Collection Time: 10/31/19  5:35 AM  Result Value Ref Range   Sodium 141 135 - 145 mmol/L   Potassium 3.4 (L) 3.5 - 5.1 mmol/L   Chloride 105 98 - 111 mmol/L   CO2 27 22 - 32 mmol/L   Glucose, Bld 134 (H) 70 - 99 mg/dL    Comment: Glucose reference range applies only to samples taken after fasting for at least 8 hours.   BUN 7 6 - 20 mg/dL   Creatinine, Ser 0.53 0.44 - 1.00 mg/dL   Calcium 8.5 (L) 8.9 - 10.3 mg/dL   GFR calc non Af Amer >60 >60 mL/min   GFR calc Af Amer >60 >60 mL/min   Anion gap 9 5 - 15    Comment: Performed at [facility] [address] [place], [place] [address]  Glucose, capillary     Status: None   Collection Time: 10/31/19  7:28 AM  Result Value Ref Range   Glucose-Capillary 90 70 - 99 mg/dL    Comment: Glucose reference range applies only to samples taken after fasting for at least 8 hours.   Comment 1 Notify RN    Comment 2 Document in Chart   Glucose, capillary     Status: Abnormal   Collection  Time: 10/31/19 11:32 AM  Result Value Ref Range   Glucose-Capillary 178 (H) 70 - 99 mg/dL    Comment: Glucose reference range applies only to samples taken after fasting for at least 8 hours.  Glucose, capillary     Status: None   Collection Time: 10/31/19  4:51 PM  Result Value Ref Range   Glucose-Capillary 81 70 - 99 mg/dL    Comment: Glucose reference range applies only to samples taken after fasting for at least 8 hours.  Glucose, capillary     Status: Abnormal   Collection Time: 10/31/19  8:00 PM  Result Value Ref Range   Glucose-Capillary 226 (H) 70 - 99 mg/dL    Comment: Glucose reference range applies only to samples taken after fasting for at least 8 hours.  Glucose, capillary     Status: Abnormal   Collection Time: 11/01/19 12:08 AM  Result Value Ref Range   Glucose-Capillary 141 (H) 70 - 99 mg/dL    Comment: Glucose reference range applies only to samples taken after fasting for at least 8  hours.  Glucose, capillary     Status: Abnormal   Collection Time: 11/01/19  4:11 AM  Result Value Ref Range   Glucose-Capillary 142 (H) 70 - 99 mg/dL    Comment: Glucose reference range applies only to samples taken after fasting for at least 8 hours.  Glucose, capillary     Status: Abnormal   Collection Time: 11/01/19  8:59 AM  Result Value Ref Range   Glucose-Capillary 140 (H) 70 - 99 mg/dL    Comment: Glucose reference range applies only to samples taken after fasting for at least 8 hours.  Glucose, capillary     Status: Abnormal   Collection Time: 11/01/19 12:05 PM  Result Value Ref Range   Glucose-Capillary 129 (H) 70 - 99 mg/dL    Comment: Glucose reference range applies only to samples taken after fasting for at least 8 hours.    Studies/Results: No results found.  Medications: I have reviewed the patient's current medications.  Assessment: Abnormal CAT scan from 10/21/2019: Rectal wall thickening, 6.3 cm length area of sigmoid colon wall thickening could be colitis versus stercoral colitis versus colonic neoplasm.  As per patient's daughter she has been having significant amount of unintentional weight loss, initial constipation followed by diarrhea and fecal incontinence since the beginning of the year.  Admitted with seizures History of diabetes, dyslipidemia, hypothyroidism  Plan: As per previous notes by GI team, patient has been very somnolent and unable to finish colonic prep. Today she appears awake and is oriented x3, and has finished moviprep with multiple loose stools but not clear stools. Had a long discussion with the patient and her daughter at bedside. Plan to put patient on clear liquid diet today, give her MiraLAX 255 g x 1 dose to be mixed in gatorade, starting at 2 PM today, then keep her n.p.o. post midnight for colonoscopy to be performed at 8 AM tomorrow. The risks and the benefits of the procedure were discussed with the patient and her daughter in  details. They understand and verbalized consent.  [name], MD 11/01/2019, 1:28 PM

## 2019-11-01 NOTE — H&P (View-Only) (Signed)
Subjective: Patient seen and examined at bedside in presence of her daughter [name]. She was able to finish the movie prep today and has had several bowel movements that are loose but not clear as yet.  Objective: Vital signs in last 24 hours: Temp:  [98.1 F (36.7 C)-99.1 F (37.3 C)] 98.1 F (36.7 C) (07/03 1203) Pulse Rate:  [82-107] 94 (07/03 1203) Resp:  [14-20] 20 (07/03 1203) BP: (105-113)/(61-74) 105/62 (07/03 1203) SpO2:  [100 %] 100 % (07/03 1203) Weight change:  Last BM Date: 10/31/19  PE: Awake, oriented to time, place and person GENERAL: Mild pallor, no icterus ABDOMEN: Soft, nondistended, nontender, normoactive bowel sounds EXTREMITIES: No evidence of pedal edema  Lab Results: Results for orders placed or performed during the hospital encounter of 10/19/19 (from the past 48 hour(s))  Glucose, capillary     Status: Abnormal   Collection Time: 10/30/19  3:15 PM  Result Value Ref Range   Glucose-Capillary 191 (H) 70 - 99 mg/dL    Comment: Glucose reference range applies only to samples taken after fasting for at least 8 hours.  Glucose, capillary     Status: Abnormal   Collection Time: 10/30/19  8:07 PM  Result Value Ref Range   Glucose-Capillary 214 (H) 70 - 99 mg/dL    Comment: Glucose reference range applies only to samples taken after fasting for at least 8 hours.  Glucose, capillary     Status: Abnormal   Collection Time: 10/30/19 11:16 PM  Result Value Ref Range   Glucose-Capillary 112 (H) 70 - 99 mg/dL    Comment: Glucose reference range applies only to samples taken after fasting for at least 8 hours.   Comment 1 Notify RN    Comment 2 Document in Chart   Glucose, capillary     Status: Abnormal   Collection Time: 10/31/19  3:05 AM  Result Value Ref Range   Glucose-Capillary 185 (H) 70 - 99 mg/dL    Comment: Glucose reference range applies only to samples taken after fasting for at least 8 hours.   Comment 1 Notify RN    Comment 2 Document in Chart    Basic metabolic panel     Status: Abnormal   Collection Time: 10/31/19  5:35 AM  Result Value Ref Range   Sodium 141 135 - 145 mmol/L   Potassium 3.4 (L) 3.5 - 5.1 mmol/L   Chloride 105 98 - 111 mmol/L   CO2 27 22 - 32 mmol/L   Glucose, Bld 134 (H) 70 - 99 mg/dL    Comment: Glucose reference range applies only to samples taken after fasting for at least 8 hours.   BUN 7 6 - 20 mg/dL   Creatinine, Ser 0.53 0.44 - 1.00 mg/dL   Calcium 8.5 (L) 8.9 - 10.3 mg/dL   GFR calc non Af Amer >60 >60 mL/min   GFR calc Af Amer >60 >60 mL/min   Anion gap 9 5 - 15    Comment: Performed at [facility] [address], [place], [place] [address]  Glucose, capillary     Status: None   Collection Time: 10/31/19  7:28 AM  Result Value Ref Range   Glucose-Capillary 90 70 - 99 mg/dL    Comment: Glucose reference range applies only to samples taken after fasting for at least 8 hours.   Comment 1 Notify RN    Comment 2 Document in Chart   Glucose, capillary     Status: Abnormal   Collection  Time: 10/31/19 11:32 AM  Result Value Ref Range   Glucose-Capillary 178 (H) 70 - 99 mg/dL    Comment: Glucose reference range applies only to samples taken after fasting for at least 8 hours.  Glucose, capillary     Status: None   Collection Time: 10/31/19  4:51 PM  Result Value Ref Range   Glucose-Capillary 81 70 - 99 mg/dL    Comment: Glucose reference range applies only to samples taken after fasting for at least 8 hours.  Glucose, capillary     Status: Abnormal   Collection Time: 10/31/19  8:00 PM  Result Value Ref Range   Glucose-Capillary 226 (H) 70 - 99 mg/dL    Comment: Glucose reference range applies only to samples taken after fasting for at least 8 hours.  Glucose, capillary     Status: Abnormal   Collection Time: 11/01/19 12:08 AM  Result Value Ref Range   Glucose-Capillary 141 (H) 70 - 99 mg/dL    Comment: Glucose reference range applies only to samples taken after fasting for at least 8  hours.  Glucose, capillary     Status: Abnormal   Collection Time: 11/01/19  4:11 AM  Result Value Ref Range   Glucose-Capillary 142 (H) 70 - 99 mg/dL    Comment: Glucose reference range applies only to samples taken after fasting for at least 8 hours.  Glucose, capillary     Status: Abnormal   Collection Time: 11/01/19  8:59 AM  Result Value Ref Range   Glucose-Capillary 140 (H) 70 - 99 mg/dL    Comment: Glucose reference range applies only to samples taken after fasting for at least 8 hours.  Glucose, capillary     Status: Abnormal   Collection Time: 11/01/19 12:05 PM  Result Value Ref Range   Glucose-Capillary 129 (H) 70 - 99 mg/dL    Comment: Glucose reference range applies only to samples taken after fasting for at least 8 hours.    Studies/Results: No results found.  Medications: I have reviewed the patient's current medications.  Assessment: Abnormal CAT scan from 10/21/2019: Rectal wall thickening, 6.3 cm length area of sigmoid colon wall thickening could be colitis versus stercoral colitis versus colonic neoplasm.  As per patient's daughter she has been having significant amount of unintentional weight loss, initial constipation followed by diarrhea and fecal incontinence since the beginning of the year.  Admitted with seizures History of diabetes, dyslipidemia, hypothyroidism  Plan: As per previous notes by GI team, patient has been very somnolent and unable to finish colonic prep. Today she appears awake and is oriented x3, and has finished moviprep with multiple loose stools but not clear stools. Had a long discussion with the patient and her daughter at bedside. Plan to put patient on clear liquid diet today, give her MiraLAX 255 g x 1 dose to be mixed in gatorade, starting at 2 PM today, then keep her n.p.o. post midnight for colonoscopy to be performed at 8 AM tomorrow. The risks and the benefits of the procedure were discussed with the patient and her daughter in  details. They understand and verbalized consent.  [name], MD 11/01/2019, 1:28 PM

## 2019-11-01 NOTE — Progress Notes (Signed)
[facility]                                                                              Patient Demographics  Andrea [name], is a 59 y.o. female, DOB - August 20, 1960, XKG:[id number]  Admit date - 10/19/2019   Admitting Physician Elmarie Shiley, MD  Outpatient Primary MD for the patient is Bright, Aaron Mose, MD  Outpatient specialists:   LOS - 12  days   Medical records reviewed and are as summarized below:    Chief Complaint  Patient presents with  . Code Stroke       Brief summary   Patient is a 59 year old female with history of insulin-dependent diabetes mellitus, hyperlipidemia, hypothyroidism who was brought to the emergency department for evaluation of hypoglycemia. She presented to the emergency department on 6/19 for similar problem and was discharged to home after correction ofhypoglycemia. When EMS arrived at home, patient had 2 focal seizures and was given IV Versed and glucose. On presentation she had mild left-sided weakness and dysarthria. She takes 70/30 insulin at home. On presentation, CT head did not show any acute abnormality but MRI findings are consistent with recent seizure activity. Neurology was consulted. Hospital course remarkable for severe constipation.she developed twitching of face muscles on 09/2319. She underwent LP by neurology suspecting for autoimmune/inflammatory disorder. Started on high-dose steroids. PT recommending SNF on DC    Assessment & Plan   Principal problem Acute metabolic encephalopathy -Likely secondary to hypoglycemia versus seizure -Patient is alert and oriented today, states she is almost finished up with her prep.   Active problems Seizures -New onset, likely due to hypoglycemia however EEG also showed epileptiform discharges. -Neurology was consulted, patient started on Keppra and Clobazam -No abnormal enhancement seen on MRI, again had facial muscle twitching on 6/23. -Neurology did an LP,  started on Solu-Medrol for possible autoimmune/inflammatory disorder/paraneoplastic disorder.  Unclear etiology, CSF IgG cell count, HSV, myelin basic protein, VDRL normal.  CSF culture negative -Neurology recommended IV Solu-Medrol 1 g daily for 5 doses, completed on 6/27 -Autoimmune and paraneoplastic labs sent to [facility], still pending -Continued on high-dose Keppra and clobazam -Neurology recommended follow-up with Dr. Delice Lesch in 6 weeks and consider repeat MRI brain -No repeat seizures during hospitalization   Type I diabetic with hypoglycemia episodes, uncontrolled, IDDM -Reportedly on Novolin 70/30 at home 80 units in the morning and 85 units at night however having intermittent hypoglycemia episodes while inpatient -Unclear etiology for intermittent hypoglycemia,  Hemoglobin A1c 12.4 on 6/22 -C-peptide, pro-insulin level both low, patient is type I diabetic, random insulin level normal -TSH 1.3, albumin 2.4 -Registered dietitian consult, placed on prostat, Ensure nutritional supplements, encourage p.o. diet -Patient follows endocrinologist, Dr. Honor Junes -Continue sliding scale insulin, will adjust once back on carb modified diet  Anorexia/weight loss, early satiety, abnormal CT scan -CT abdomen pelvis showed fecal impaction, bilateral hydroureteronephrosis due to distended rectum, approximately 6.3 cm sigmoid colonic wall thickening may represent focal colitis including stercoral colitis versus colonic neoplasm, large volume of stool throughout the remainder of colon --Extensive GI work-up outpatient with [facility] (Dr [name]), had upper endoscopy on 04/09/2019  for the above complaints, which showed chronic gastritis with H. pylori.  No metaplasia or dysplasia or carcinoma. -She had a normal GE study on 04/17/2019 which showed no gastroparesis -Patient had flex sig 2016 which had shown diverticulosis with nonthrombosed external hemorrhoids, polyps, otherwise benign. -CEA, CA 19-9,  normal -Patient reported that she is almost finished with the colon prep.  However when I discussed with patient's granddaughter, she did not want to proceed with a colonoscopy over the weekend and reconvene on Monday or it can be done outpatient.  Discussed with GI, Dr. Therisa Doyne, will discuss with patient's granddaughter if colonoscopy feasible tomorrow as patient has almost finished the prep.   Hypothyroidism -Follows with endocrinology, Dr. Dina Rich, continue Synthroid, Cytomel  Left upper lobe nodule, 3 mm incidental finding -Repeat CT chest in 3 months, past smoker  Severe constipation -CT abdomen/pelvis findings noted, placed on Senokot-S, MiraLAX daily  Generalized debility, deconditioning -PT evaluation recommended today recommended home health, 24-hour supervision, 3 n1, DME rolling walker  Severe protein calorie malnutrition Estimated body mass index is 18.71 kg/m as calculated from the following:   Height as of 10/18/19: 5\' 7"  (1.702 m).   Weight as of this encounter: 54.2 kg.   Goals of care, social -Discussed in detail with patient's granddaughter, [name].  Discussed in detail about PT evaluation, she states that patient will not have 24/7 supervision at home hence requesting SNF for rehab.  Social work consult has been placed.  Code Status: Full CODE STATUS DVT Prophylaxis:  Lovenox  Family Communication: Discussed all imaging results, lab results, explained to the patient's granddaughter, Arloa Prak today  Disposition Plan:     Status is: Inpatient  Remains inpatient appropriate because:Inpatient level of care appropriate due to severity of illness   Dispo: The patient is from: Home              Anticipated d/c is to: Home              Anticipated d/c date is: 2 days              Patient currently is not medically stable to d/c.  Hypoglycemia episodes stabilized, awaiting colonoscopy and SNF    Time Spent in minutes 25 minutes Procedures:   None  Consultants:   Gastroenterology  Antimicrobials:   Anti-infectives (From admission, onward)   None         Medications  Scheduled Meds: . aspirin EC  81 mg Oral Daily  . bisacodyl  10 mg Rectal Once  . cloBAZam  5 mg Oral BID  . enoxaparin (LOVENOX) injection  40 mg Subcutaneous Q24H  . feeding supplement (PRO-STAT SUGAR FREE 64)  30 mL Oral BID  . levETIRAcetam  1,500 mg Oral BID  . levothyroxine  150 mcg Oral Q0600  . liothyronine  5 mcg Oral Daily  . multivitamin with minerals  1 tablet Oral Daily  . polyethylene glycol powder  1 Container Oral Once  . Ensure Max Protein  11 oz Oral TID  . rosuvastatin  20 mg Oral QHS  . sodium chloride flush  3 mL Intravenous Once   Continuous Infusions: . sodium chloride 20 mL/hr at 10/31/19 2230   PRN Meds:.acetaminophen **OR** acetaminophen, hydrALAZINE, LORazepam, ondansetron (ZOFRAN) IV      Subjective:   Setareh Rom was seen and examined today.  Alert and oriented, states almost finished the bowel prep.  No acute complaints.  No nausea or vomiting or abdominal pain.  Objective:   Vitals:   10/31/19 1959 10/31/19 2342 11/01/19 0411 11/01/19 0857  BP: 107/63 105/61 108/66 113/74  Pulse: 96 82 (!) 107 98  Resp: 17 14 16 16   Temp: 98.2 F (36.8 C) 98.8 F (37.1 C) 99.1 F (37.3 C) 98.9 F (37.2 C)  TempSrc: Oral Oral Oral Oral  SpO2: 100% 100% 100% 100%  Weight:        Intake/Output Summary (Last 24 hours) at 11/01/2019 1154 Last data filed at 10/31/2019 2230 Gross per 24 hour  Intake 480 ml  Output --  Net 480 ml     Wt Readings from Last 3 Encounters:  10/31/19 54.2 kg  10/18/19 53.5 kg  10/08/19 49.4 kg   Physical Exam  General: Alert and oriented x 3, NAD  Cardiovascular: S1 S2 clear, RRR. No pedal edema b/l  Respiratory: CTAB, no wheezing, rales or rhonchi  Gastrointestinal: Soft, nontender, nondistended, NBS  Ext: no pedal edema bilaterally  Neuro: no new  deficits  Musculoskeletal: No cyanosis, clubbing  Skin: No rashes  Psych: Normal affect and demeanor, alert and oriented x3    Data Reviewed:  I have personally reviewed following labs and imaging studies  Micro Results Recent Results (from the past 240 hour(s))  CSF culture with Stat gram stain     Status: None   Collection Time: 10/22/19  3:15 PM   Specimen: CSF; Cerebrospinal Fluid  Result Value Ref Range Status   Specimen Description CSF  Final   Special Requests NONE  Final   Gram Stain   Final    WBC PRESENT, PREDOMINANTLY PMN NO ORGANISMS SEEN CYTOSPIN SMEAR    Culture   Final    NO GROWTH 3 DAYS Performed at [facility], [address] [place], [place] [address]    Report Status 10/25/2019 FINAL  Final    Radiology Reports CT CHEST W CONTRAST  Result Date: 10/23/2019 CLINICAL DATA:  Seizure, abnormal CT abdomen with basilar lung changes EXAM: CT CHEST WITH CONTRAST TECHNIQUE: Multidetector CT imaging of the chest was performed during intravenous contrast administration. Sagittal and coronal MPR images reconstructed from axial data set. CONTRAST:  44mL OMNIPAQUE IOHEXOL 300 MG/ML  SOLN IV COMPARISON:  CT abdomen and pelvis 10/21/2019 FINDINGS: Cardiovascular: Atherosclerotic calcification aorta, proximal great vessels and coronary arteries. Aorta normal caliber. Heart normal size. No pericardial effusion. Mediastinum/Nodes: Esophagus unremarkable. Base of cervical region normal appearance. No thoracic adenopathy. Lungs/Pleura: Linear areas of atelectasis versus scarring scattered in both lungs. Patchy areas of peribronchovascular density are seen in both lungs, some associated with minimal bronchiectasis, favor post inflammatory. Central peribronchial thickening present. No segmental consolidation, pleural effusion or pneumothorax. 3 mm LEFT upper lobe nodule image 30. No additional pulmonary mass/nodule. Upper Abdomen: Stomach distended by food debris. Remaining  upper abdomen unremarkable Musculoskeletal: No osseous abnormalities. Scattered chest wall and axillary soft tissue edema, nonspecific. IMPRESSION: Scattered areas of atelectasis versus scarring in both lungs with few scattered areas of peribronchovascular opacity bilaterally, a few of which are associated with minimal bronchiectasis, favor postinflammatory scarring. Single 3 mm nonspecific LEFT upper lobe nodule. Short-term follow-up CT recommended in 3 months to reassess the areas of suspected scarring to ensure stability. Aortic Atherosclerosis (ICD10-I70.0). Electronically Signed   By: Lavonia Dana M.D.   On: 10/23/2019 13:00   MR BRAIN WO CONTRAST  Result Date: 10/19/2019 CLINICAL DATA:  Stroke follow-up. Left-sided weakness. Hypoglycemia. Witnessed seizures by EMS. Clinically suspected Todd's paralysis. EXAM: MRI HEAD WITHOUT CONTRAST TECHNIQUE: Multiplanar,  multiecho pulse sequences of the brain and surrounding structures were obtained without intravenous contrast. COMPARISON:  Head CT 10/19/2019 FINDINGS: The study is mildly motion degraded. Brain: There is mild restricted diffusion involving cortex in the medial right frontal lobe, right insula, and likely right hippocampus which does not conform to a vascular territory and is without associated edema. No intracranial hemorrhage, mass, midline shift, or extra-axial fluid collection is identified. No significant white matter disease is seen for age. The ventricles and sulci are normal. Vascular: Major intracranial vascular flow voids are preserved. Skull and upper cervical spine: Unremarkable bone marrow signal para Sinuses/Orbits: Bilateral cataract extraction. Small mucous retention cysts in the maxillary sinuses. Clear mastoid air cells. Other: None. IMPRESSION: Mild restricted diffusion in the medial right frontal lobe, right insula, and likely right hippocampus most consistent with recent seizure activity. Electronically Signed   By: Logan Bores  M.D.   On: 10/19/2019 16:48   MR BRAIN W CONTRAST  Result Date: 10/20/2019 CLINICAL DATA:  Seizure.  Abnormal MRI. EXAM: MRI HEAD WITH CONTRAST TECHNIQUE: Multiplanar, multiecho pulse sequences of the brain and surrounding structures were obtained with intravenous contrast. CONTRAST:  3mL GADAVIST GADOBUTROL 1 MMOL/ML IV SOLN COMPARISON:  MRI head without contrast 05/21/2019 FINDINGS: Brain: Review of the MRI from yesterday reveals restricted diffusion in the right medial frontal cortex, also in the right insula and right hippocampus. Note that the lesions are best seen on diffusion-weighted imaging with no significant associated FLAIR cortical edema. These findings are unilateral. No abnormal enhancement on today's study. Ventricle size is normal. Diffusion-weighted imaging was not repeated today IMPRESSION: Normal enhancement of the brain. Based on the findings yesterday, differential includes seizure related cortical activity, autoimmune related encephalitis, and less likely viral encephalitis. Consider lumbar puncture. Short-term follow-up MRI with contrast may be helpful. Electronically Signed   By: Franchot Gallo M.D.   On: 10/20/2019 16:40   CT ABDOMEN PELVIS W CONTRAST  Result Date: 10/22/2019 CLINICAL DATA:  Unintended weight loss. EXAM: CT ABDOMEN AND PELVIS WITH CONTRAST TECHNIQUE: Multidetector CT imaging of the abdomen and pelvis was performed using the standard protocol following bolus administration of intravenous contrast. CONTRAST:  161mL OMNIPAQUE IOHEXOL 300 MG/ML  SOLN COMPARISON:  None. FINDINGS: Lower chest: Majority of the chest is included in the field of view. Scattered linear and bandlike opacities throughout both lungs are typical of scarring. There is a subpleural 5 mm left upper lobe pulmonary nodule, series 6, image 26. Scattered peribronchovascular irregular opacities in the right lower lobe series 6, image 53, right middle lobe, series 6, image 39, left lower lobe, same image,  left upper lobe series 6, image 29, and right mid lung same image. No dominant pulmonary mass. Hepatobiliary: Scattered subcentimeter hypodensities in the liver are too small to characterize but likely small cysts or biliary hamartomas. No evidence of suspicious liver lesion. Gallbladder physiologically distended, no calcified stone. No biliary dilatation. Pancreas: No ductal dilatation or inflammation. Spleen: Normal in size without focal abnormality. Adrenals/Urinary Tract: Normal adrenal glands. There is right hydroureteronephrosis. The ureter is dilated to pelvic brim. There is mild left hydronephrosis and proximal hydroureter, with similar ureteral dilatation. Diminished excretion on delayed phase imaging, right greater than left. No evidence of focal renal mass. Urinary bladder is displaced anteriorly by large stool ball in the rectum. Stomach/Bowel: Large inspissated stool ball distends the rectum with rectal distention of 8.6 cm. Associated rectal wall thickening and perirectal edema. No pneumatosis. Approximately 6.3 cm length area of sigmoid colonic  wall thickening with pericolonic edema just proximal to the large volume of rectosigmoid stool. Large volume of stool throughout the remainder of the colon, there is significant sigmoid colonic redundancy. No additional areas of colonic wall thickening. The appendix is not confidently visualized. The stomach is unremarkable. No small bowel obstruction or abnormal distention. No definite small bowel inflammation. Vascular/Lymphatic: Moderate aorto bi-iliac atherosclerosis, advanced for age. No aortic aneurysm. Patent portal vein. There is no adenopathy in the abdomen or pelvis. Reproductive: Calcified uterine fibroids. The uterus is displaced into the right pelvis and anteriorly by large rectosigmoid stool burden. No evidence of adnexal mass. Other: Perirectal and distal pericolonic edema with small amount of presacral fluid. No other ascites. There is no free  air or perforation. Mild generalized body wall edema. Musculoskeletal: Mild L1 superior endplate compression fracture, age indeterminate. No evidence of focal bone lesion. IMPRESSION: 1. Large inspissated stool ball distends the rectum with rectal distention of 8.6 cm. Associated rectal wall thickening and perirectal edema, suspicious for fecal impaction. The stool distended rectum causes mass effect on the urinary bladder, uterus, and distal ureters with bilateral hydroureteronephrosis, right greater than left. 2. Approximately 6.3 cm length area of sigmoid colonic wall thickening with pericolonic edema just proximal to the large volume of rectosigmoid stool. This may represent focal colitis, including stercoral colitis versus colonic neoplasm. 3. Large volume of stool throughout the remainder of the colon. There is significant sigmoid colonic redundancy. 4. Scattered peribronchovascular irregular opacities in the lung bases, likely infectious or inflammatory. There is a 5 mm subpleural left upper lobe pulmonary nodule. No follow-up needed if patient is low-risk (and has no known or suspected primary neoplasm). Non-contrast chest CT can be considered in 12 months if patient is high-risk. This recommendation follows the consensus statement: Guidelines for Management of Incidental Pulmonary Nodules Detected on CT Images: From the Fleischner Society 2017; Radiology 2017; 284:228-243. 5. Mild L1 superior endplate compression fracture, age indeterminate. Aortic Atherosclerosis (ICD10-I70.0). Electronically Signed   By: Keith Rake M.D.   On: 10/22/2019 00:00   DG CHEST PORT 1 VIEW  Result Date: 10/19/2019 CLINICAL DATA:  59 year old female with left-sided weakness. Concern for stroke. EXAM: PORTABLE CHEST 1 VIEW COMPARISON:  Chest radiograph dated 01/14/2005. FINDINGS: No focal consolidation, pleural effusion or pneumothorax. The cardiac silhouette is within limits. No acute osseous pathology. IMPRESSION: No  active disease. Electronically Signed   By: Anner Crete M.D.   On: 10/19/2019 19:28   EEG adult  Result Date: 10/19/2019 Lora Havens, MD     10/19/2019  8:23 PM Patient Name: Chavy Avera MRN: [id number] Epilepsy Attending: Lora Havens Referring Physician/Provider: Dr Amie [place] Date: 10/19/2019 Duration: 30.57 mins Patient history: 59yo F who presented with ams and seizure. EEG to evaluate for seizure, Level of alertness: Awake AEDs during EEG study:  keppra Technical aspects: This EEG study was done with scalp electrodes positioned according to the 10-20 International system of electrode placement. Electrical activity was acquired at a sampling rate of 500Hz  and reviewed with a high frequency filter of 70Hz  and a low frequency filter of 1Hz . EEG data were recorded continuously and digitally stored. Description: No posterior dominant rhythm was seen. EEG showed generalize, maximal bifrontal periodic epileptiform discharges with triphasic morphology at 0.25-0.5Hz . EEG also showed continuous generalized 8-9Hz  alpha activity.  Hyperventilation and photic stimulation were not performed.   ABNORMALITY -Periodic epileptiform discharges with triphasic morphology, generalized, maximal bifrontal IMPRESSION: This study showed evidence of generalized epileptogenicity with epileptiform discharges  at 0.25-0.5hz  which is on the ictal-interictal continuum. No definite seizures were seen throughout the recording. Dr Cheral Marker was notified. Priyanka Barbra Sarks   Overnight EEG with video  Result Date: 10/20/2019 Lora Havens, MD     10/21/2019  1:03 PM Patient Name: Lakara Weiland MRN: [id number] Epilepsy Attending: Lora Havens Referring Physician/Provider: Dr Milus Banister Duration: 10/19/2019 2027 to 10/20/2019 2027  Patient history: 59yo F who presented with ams and seizure. EEG to evaluate for seizure,  Level of alertness: Awake  AEDs during EEG study:  keppra  Technical aspects: This EEG study was done  with scalp electrodes positioned according to the 10-20 International system of electrode placement. Electrical activity was acquired at a sampling rate of 500Hz  and reviewed with a high frequency filter of 70Hz  and a low frequency filter of 1Hz . EEG data were recorded continuously and digitally stored.  Description: No posterior dominant rhythm was seen. EEG initially showed generalized, maximal bifrontal periodic epileptiform discharges with triphasic morphology at 0.25-0.5Hz . Gradually, eeg showed right frontocentral periodic epileptiform discharges at 1Hz . EEG also showed continuous generalized 8-9Hz  alpha activity. Event button was pressed multiple times during which patient was noted to have left side face twitching, lasting about 30 seconds to one minute. Concomitant eeg showed rhythmic sharply contoured 5-9h generalized theta-alpha activity which evolved into 3-5Hz  theta-delta activity.  ABNORMALITY - Seizure, generalized - Periodic epileptiform discharges with triphasic morphology, generalized, maximal bifrontal - Continuous slow, generalized  IMPRESSION: This study showed multiple seizures with generalized onset during which patient was noted to have left face twitching, lasting 30 seconds to one minute. Additionally, there are epileptiform discharges at 0.25-0.5hz  which is on the ictal-interictal continuum as well as moderate diffuse encephalopathy, non specific to etiology.  Lora Havens   CT HEAD CODE STROKE WO CONTRAST  Result Date: 10/19/2019 CLINICAL DATA:  Code stroke.  Left-sided weakness. EXAM: CT HEAD WITHOUT CONTRAST TECHNIQUE: Contiguous axial images were obtained from the base of the skull through the vertex without intravenous contrast. COMPARISON:  None. FINDINGS: The study is mildly motion degraded. Brain: Within limitations of motion, no acute infarct, intracranial hemorrhage, mass, midline shift, or extra-axial fluid collection is identified. The ventricles and sulci are  normal. Vascular: Calcified atherosclerosis at the skull base. No hyperdense vessel. Skull: No fracture or suspicious osseous lesion. Sinuses/Orbits: Partially visualized mild mucosal thickening in the left maxillary sinus. Bilateral cataract extraction. Other: None. ASPECTS [facility] Stroke Program Early CT Score) - Ganglionic level infarction (caudate, lentiform nuclei, internal capsule, insula, M1-M3 cortex): 7 - Supraganglionic infarction (M4-M6 cortex): 3 Total score (0-10 with 10 being normal): 10 IMPRESSION: 1. No evidence of acute intracranial abnormality within limitations of mild motion. 2. ASPECTS is 10. These results were communicated to Dr. Rory Percy at 3:50 pm on 10/19/2019 by text page via the [facility] messaging system. Electronically Signed   By: Logan Bores M.D.   On: 10/19/2019 15:50    Lab Data:  CBC: Recent Labs  Lab 10/27/19 0240 10/29/19 0737 10/30/19 0327  WBC 8.5 8.6 8.7  NEUTROABS 6.5  --   --   HGB 9.6* 9.7* 10.2*  HCT 29.4* 30.0* 31.5*  MCV 87.0 88.5 89.5  PLT 491* 472* 762*   Basic Metabolic Panel: Recent Labs  Lab 10/27/19 0240 10/28/19 0343 10/29/19 0737 10/30/19 0327 10/31/19 0535  NA 141 139 141 139 141  K 3.5 3.1* 3.6 4.3 3.4*  CL 105 105 104 103 105  CO2 22 26 28 28 27   GLUCOSE  153* 56* 75 117* 134*  BUN 12 13 7  5* 7  CREATININE 0.79 0.59 0.59 0.60 0.53  CALCIUM 8.7* 8.4* 8.3* 8.5* 8.5*   GFR: Estimated Creatinine Clearance: 64.8 mL/min (by C-G formula based on SCr of 0.53 mg/dL). Liver Function Tests: No results for input(s): AST, ALT, ALKPHOS, BILITOT, PROT, ALBUMIN in the last 168 hours. No results for input(s): LIPASE, AMYLASE in the last 168 hours. No results for input(s): AMMONIA in the last 168 hours. Coagulation Profile: No results for input(s): INR, PROTIME in the last 168 hours. Cardiac Enzymes: No results for input(s): CKTOTAL, CKMB, CKMBINDEX, TROPONINI in the last 168 hours. BNP (last 3 results) No results for input(s): PROBNP in  the last 8760 hours. HbA1C: No results for input(s): HGBA1C in the last 72 hours. CBG: Recent Labs  Lab 10/31/19 1651 10/31/19 2000 11/01/19 0008 11/01/19 0411 11/01/19 0859  GLUCAP 81 226* 141* 142* 140*   Lipid Profile: No results for input(s): CHOL, HDL, LDLCALC, TRIG, CHOLHDL, LDLDIRECT in the last 72 hours. Thyroid Function Tests: No results for input(s): TSH, T4TOTAL, FREET4, T3FREE, THYROIDAB in the last 72 hours. Anemia Panel: No results for input(s): VITAMINB12, FOLATE, FERRITIN, TIBC, IRON, RETICCTPCT in the last 72 hours. Urine analysis:    Component Value Date/Time   COLORURINE COLORLESS (A) 10/08/2019 1848   APPEARANCEUR CLEAR (A) 10/08/2019 1848   LABSPEC 1.017 10/08/2019 1848   PHURINE 6.0 10/08/2019 1848   GLUCOSEU >=500 (A) 10/08/2019 1848   HGBUR NEGATIVE 10/08/2019 1848   BILIRUBINUR NEGATIVE 10/08/2019 1848   KETONESUR NEGATIVE 10/08/2019 1848   PROTEINUR NEGATIVE 10/08/2019 1848   NITRITE NEGATIVE 10/08/2019 1848   LEUKOCYTESUR NEGATIVE 10/08/2019 1848     Omer Puccinelli M.D. [facility] 11/01/2019, 11:54 AM   Call night coverage person covering after 7pm

## 2019-11-01 NOTE — Progress Notes (Signed)
Patient Niece or Grandaughter would like to be called daily with updates by MD pertaining to patient care.  Gwendolyn Grant, RN

## 2019-11-02 ENCOUNTER — Inpatient Hospital Stay (HOSPITAL_COMMUNITY): Payer: Medicaid Other | Admitting: Anesthesiology

## 2019-11-02 ENCOUNTER — Encounter (HOSPITAL_COMMUNITY): Payer: Self-pay | Admitting: Internal Medicine

## 2019-11-02 ENCOUNTER — Encounter (HOSPITAL_COMMUNITY)
Disposition: A | Discharge status: Skilled Nursing Facility | Payer: Self-pay | Source: Home / Self Care | Attending: Internal Medicine

## 2019-11-02 HISTORY — PX: COLONOSCOPY WITH PROPOFOL: SHX5780

## 2019-11-02 HISTORY — PX: POLYPECTOMY: SHX5525

## 2019-11-02 HISTORY — PX: BIOPSY: SHX5522

## 2019-11-02 LAB — BASIC METABOLIC PANEL
Anion gap: 7 (ref 5–15)
BUN: 7 mg/dL (ref 6–20)
CO2: 27 mmol/L (ref 22–32)
Calcium: 8.8 mg/dL — ABNORMAL LOW (ref 8.9–10.3)
Chloride: 106 mmol/L (ref 98–111)
Creatinine, Ser: 0.53 mg/dL (ref 0.44–1.00)
GFR calc Af Amer: >60 mL/min (ref >60)
GFR calc non Af Amer: >60 mL/min (ref >60)
Glucose, Bld: 110 mg/dL — ABNORMAL HIGH (ref 70–99)
Potassium: 3.3 mmol/L — ABNORMAL LOW (ref 3.5–5.1)
Sodium: 140 mmol/L (ref 135–145)

## 2019-11-02 LAB — CBC
HCT: 30.2 % — ABNORMAL LOW (ref 36.0–46.0)
Hemoglobin: 10.1 g/dL — ABNORMAL LOW (ref 12.0–15.0)
MCH: 29.6 pg (ref 26.0–34.0)
MCHC: 33.4 g/dL (ref 30.0–36.0)
MCV: 88.6 fL (ref 80.0–100.0)
Platelets: 343 10*3/uL (ref 150–400)
RBC: 3.41 MIL/uL — ABNORMAL LOW (ref 3.87–5.11)
RDW: 14.7 % (ref 11.5–15.5)
WBC: 5.5 10*3/uL (ref 4.0–10.5)
nRBC: 0 % (ref 0.0–0.2)

## 2019-11-02 LAB — GLUCOSE, CAPILLARY
Glucose-Capillary: 135 mg/dL — ABNORMAL HIGH (ref 70–99)
Glucose-Capillary: 108 mg/dL — ABNORMAL HIGH (ref 70–99)
Glucose-Capillary: 122 mg/dL — ABNORMAL HIGH (ref 70–99)
Glucose-Capillary: 191 mg/dL — ABNORMAL HIGH (ref 70–99)
Glucose-Capillary: 212 mg/dL — ABNORMAL HIGH (ref 70–99)
Glucose-Capillary: 208 mg/dL — ABNORMAL HIGH (ref 70–99)

## 2019-11-02 LAB — SULFONYLUREA HYPOGLYCEMICS PANEL, SERUM
Acetohexamide: NEGATIVE ug/mL (ref 20–60)
Chlorpropamide: NEGATIVE ug/mL (ref 75–250)
Glimepiride: NEGATIVE ng/mL (ref 80–250)
Glipizide: NEGATIVE ng/mL (ref 200–1000)
Glyburide: NEGATIVE ng/mL
Nateglinide: NEGATIVE ng/mL
Repaglinide: NEGATIVE ng/mL
Tolazamide: NEGATIVE ug/mL
Tolbutamide: NEGATIVE ug/mL (ref 40–100)

## 2019-11-02 LAB — MRSA PCR SCREENING: MRSA by PCR: NEGATIVE

## 2019-11-02 SURGERY — COLONOSCOPY WITH PROPOFOL
Anesthesia: Monitor Anesthesia Care

## 2019-11-02 MED ORDER — LACTATED RINGERS IV SOLN: INTRAVENOUS | Status: DC | PRN

## 2019-11-02 MED ORDER — POTASSIUM CHLORIDE 10 MEQ/100ML IV SOLN
INTRAVENOUS | Status: AC
  Administered 2019-11-02: 10 meq
  Filled 2019-11-02: qty 100

## 2019-11-02 MED ORDER — SENNOSIDES-DOCUSATE SODIUM 8.6-50 MG PO TABS
1.0000 | ORAL_TABLET | Freq: Two times a day (BID) | ORAL | Status: DC
  Administered 2019-11-03 – 2019-11-12 (×18): 1 via ORAL
  Filled 2019-11-02 (×19): qty 1

## 2019-11-02 MED ORDER — LIDOCAINE 2% (20 MG/ML) 5 ML SYRINGE
INTRAMUSCULAR | Status: DC | PRN
Start: 2019-11-02 — End: 2019-11-02
  Administered 2019-11-02: 60 mg via INTRAVENOUS

## 2019-11-02 MED ORDER — PSYLLIUM 95 % PO PACK
1.0000 | PACK | Freq: Every day | ORAL | Status: DC
  Administered 2019-11-02 – 2019-11-03 (×2): 1 via ORAL
  Filled 2019-11-02 (×2): qty 1

## 2019-11-02 MED ORDER — PROPOFOL 500 MG/50ML IV EMUL
INTRAVENOUS | Status: DC | PRN
  Administered 2019-11-02: 100 ug/kg/min via INTRAVENOUS

## 2019-11-02 MED ORDER — POTASSIUM CHLORIDE 10 MEQ/100ML IV SOLN
10.0000 meq | INTRAVENOUS | Status: AC
  Administered 2019-11-02: 10 meq via INTRAVENOUS
  Filled 2019-11-02: qty 100

## 2019-11-02 MED ORDER — PROPOFOL 10 MG/ML IV BOLUS
INTRAVENOUS | Status: DC | PRN
  Administered 2019-11-02: 20 mg via INTRAVENOUS
  Administered 2019-11-02: 50 mg via INTRAVENOUS

## 2019-11-02 MED ORDER — POTASSIUM CHLORIDE 10 MEQ/100ML IV SOLN
INTRAVENOUS | Status: AC
  Administered 2019-11-02: 10 meq via INTRAVENOUS
  Filled 2019-11-02: qty 100

## 2019-11-02 SURGICAL SUPPLY — 22 items

## 2019-11-02 NOTE — Interval H&P Note (Signed)
History and Physical Interval Note: 58/female with change in bowel habits, unintentional weight loss, abnormal CT for a colonoscopy.  11/02/2019 8:01 AM  Andrea [name]  has presented today for Colonoscopy, with the diagnosis of abnormal CT abdomen, possible sigmoid mass.  The various methods of treatment have been discussed with the patient and family. After consideration of risks, benefits and other options for treatment, the patient has consented to  Procedure(s): COLONOSCOPY WITH PROPOFOL (N/A) as a surgical intervention.  The patient's history has been reviewed, patient examined, no change in status, stable for surgery.  I have reviewed the patient's chart and labs.  Questions were answered to the patient's satisfaction.     [name]

## 2019-11-02 NOTE — Transfer of Care (Signed)
Immediate Anesthesia Transfer of Care Note  Patient: Andrea [name]  Procedure(s) Performed: COLONOSCOPY WITH PROPOFOL (N/A ) POLYPECTOMY  Patient Location: PACU  Anesthesia Type:MAC  Level of Consciousness: awake and patient cooperative  Airway & Oxygen Therapy: Patient Spontanous Breathing and Patient connected to nasal cannula oxygen  Post-op Assessment: Report given to RN and Post -op Vital signs reviewed and stable  Post vital signs: Reviewed and stable  Last Vitals:  Vitals Value Taken Time  BP 106/71 11/02/19 0923  Temp 36.7 C 11/02/19 0900  Pulse 84 11/02/19 0943  Resp 14 11/02/19 0943  SpO2 100 % 11/02/19 0943  Vitals shown include unvalidated device data.  Last Pain:  Vitals:   11/02/19 0845  TempSrc:   PainSc: 0-No pain         Complications: No complications documented.

## 2019-11-02 NOTE — Progress Notes (Signed)
[facility]                                                                              Patient Demographics  Andrea [name], is a 59 y.o. female, DOB - Aug 29, 1960, WCH:[id number]  Admit date - 10/19/2019   Admitting Physician Elmarie Shiley, MD  Outpatient Primary MD for the patient is Bright, Aaron Mose, MD  Outpatient specialists:   LOS - 13  days   Medical records reviewed and are as summarized below:    Chief Complaint  Patient presents with  . Code Stroke       Brief summary   Patient is a 59 year old female with history of insulin-dependent diabetes mellitus, hyperlipidemia, hypothyroidism who was brought to the emergency department for evaluation of hypoglycemia. She presented to the emergency department on 6/19 for similar problem and was discharged to home after correction ofhypoglycemia. When EMS arrived at home, patient had 2 focal seizures and was given IV Versed and glucose. On presentation she had mild left-sided weakness and dysarthria. She takes 70/30 insulin at home. On presentation, CT head did not show any acute abnormality but MRI findings are consistent with recent seizure activity. Neurology was consulted. Hospital course remarkable for severe constipation.she developed twitching of face muscles on 09/2319. She underwent LP by neurology suspecting for autoimmune/inflammatory disorder. Started on high-dose steroids. PT recommending SNF on DC    Assessment & Plan   Principal problem Acute metabolic encephalopathy -Likely secondary to hypoglycemia versus seizure -Alert and oriented, appears close to her baseline   Active problems Seizures -New onset, likely due to hypoglycemia however EEG also showed epileptiform discharges. -Neurology was consulted, patient started on Keppra and Clobazam -No abnormal enhancement seen on MRI, again had facial muscle twitching on 6/23. -Neurology did an LP, started on Solu-Medrol for possible  autoimmune/inflammatory disorder/paraneoplastic disorder.  Unclear etiology, CSF IgG cell count, HSV, myelin basic protein, VDRL normal.  CSF culture negative -Neurology recommended IV Solu-Medrol 1 g daily for 5 doses, completed on 6/27 -Autoimmune and paraneoplastic labs sent to [facility], still pending -Continue high-dose Keppra and clobazam -Neurology recommended follow-up with Dr. Delice Lesch in 6 weeks and consider repeat MRI brain -No repeat seizures during hospitalization   Type I diabetic with hypoglycemia episodes, uncontrolled, IDDM -Reportedly on Novolin 70/30 at home 80 units in the morning and 85 units at night however having intermittent hypoglycemia episodes while inpatient -Unclear etiology for intermittent hypoglycemia,  Hemoglobin A1c 12.4 on 6/22 -C-peptide, pro-insulin level both low, patient is type I diabetic, random insulin level normal -TSH 1.3, albumin 2.4 -Continue prostat, Ensure nutritional supplements -Patient follows endocrinologist, Dr. Honor Junes -Patient was continued on sliding scale insulin, colonoscopy completed today,  -diet advanced to solids, carb modified diet.  Readjust insulin regimen with p.o. intake. -Encouraged p.o. diet  Anorexia/weight loss, early satiety, abnormal CT scan -CT abdomen pelvis showed fecal impaction, bilateral hydroureteronephrosis due to distended rectum, approximately 6.3 cm sigmoid colonic wall thickening may represent focal colitis including stercoral colitis versus colonic neoplasm, large volume of stool throughout the remainder of colon --Extensive GI work-up outpatient with [facility] (Dr [name]), had upper endoscopy on  04/09/2019 for the above complaints, which showed chronic gastritis with H. pylori.  No metaplasia or dysplasia or carcinoma. -She had a normal GE study on 04/17/2019 which showed no gastroparesis --CEA, CA 19-9, normal -Colonoscopy today showed colonic diverticulosis, stercoral or rectosigmoid ulcer,  recommended high-fiber diet, bulk forming agents daily -Placed on Metamucil daily, placed on a bowel regimen, Senokot twice daily, MiraLAX daily as needed   Hypothyroidism -Follows with endocrinology, Dr. Dina Rich, continue Synthroid, Cytomel  Left upper lobe nodule, 3 mm incidental finding -Repeat CT chest in 3 months, past smoker  Severe constipation -CT abdomen/pelvis findings noted, placed on Senokot-S, MiraLAX daily  Generalized debility, deconditioning -PT evaluation recommended today recommended home health, 24-hour supervision, 3 n1, DME rolling walker  Severe protein calorie malnutrition Estimated body mass index is 18.71 kg/m as calculated from the following:   Height as of 10/18/19: 5\' 7"  (1.702 m).   Weight as of this encounter: 54.2 kg.   Goals of care, social -Discussed in detail with patient's granddaughter, [name].  Discussed in detail about PT evaluation, she states that patient will not have 24/7 supervision at home hence requesting SNF for rehab.  Social work consult has been placed.  Code Status: Full CODE STATUS DVT Prophylaxis:  Lovenox  Family Communication: Discussed all imaging results, lab results, explained to the patient's granddaughter [name] on phone today.  Discussed about the colonoscopy results, hopefully DC to SNF in next 24 to 48 hours  Disposition Plan:     Status is: Inpatient  Remains inpatient appropriate because:Inpatient level of care appropriate due to severity of illness   Dispo: The patient is from: Home              Anticipated d/c is to: Home              Anticipated d/c date is: 2 days              Patient currently is not medically stable to d/c.  Social work consult placed for SNF placement, hopefully DC in 24 to 48 hours pending bed available.   Time Spent in minutes 25 minutes Procedures:  None  Consultants:   Gastroenterology  Antimicrobials:   Anti-infectives (From admission, onward)   None          Medications  Scheduled Meds: . aspirin EC  81 mg Oral Daily  . bisacodyl  10 mg Rectal Once  . cloBAZam  5 mg Oral BID  . enoxaparin (LOVENOX) injection  40 mg Subcutaneous Q24H  . feeding supplement (PRO-STAT SUGAR FREE 64)  30 mL Oral BID  . insulin aspart  0-6 Units Subcutaneous Q4H  . levETIRAcetam  1,500 mg Oral BID  . levothyroxine  150 mcg Oral Q0600  . liothyronine  5 mcg Oral Daily  . multivitamin with minerals  1 tablet Oral Daily  . Ensure Max Protein  11 oz Oral TID  . rosuvastatin  20 mg Oral QHS  . sodium chloride flush  3 mL Intravenous Once   Continuous Infusions:  PRN Meds:.acetaminophen **OR** acetaminophen, hydrALAZINE, LORazepam, ondansetron (ZOFRAN) IV      Subjective:   Andrea [name] was seen and examined today.  Alert and oriented, close to her baseline.  At the time of my examination, waiting for colonoscopy.  No complaints, no nausea vomiting or fevers or chills.    Objective:   Vitals:   11/02/19 0315 11/02/19 0726 11/02/19 0845 11/02/19 0900  BP: 105/64 108/71 (!) 86/57 113/67  Pulse: (!) 102 98 (!) 101 98  Resp: 16 18 20 18   Temp: 98.2 F (36.8 C) 98.2 F (36.8 C) 98 F (36.7 C) 98.1 F (36.7 C)  TempSrc: Oral Oral    SpO2: 99% 100% 100% 100%  Weight:        Intake/Output Summary (Last 24 hours) at 11/02/2019 1224 Last data filed at 11/02/2019 0827 Gross per 24 hour  Intake 1020 ml  Output 500 ml  Net 520 ml     Wt Readings from Last 3 Encounters:  10/31/19 54.2 kg  10/18/19 53.5 kg  10/08/19 49.4 kg   Physical Exam  General: Alert and oriented x 3, NAD  Cardiovascular: S1 S2 clear, RRR. No pedal edema b/l  Respiratory: CTAB, no wheezing, rales or rhonchi  Gastrointestinal: Soft, nontender, nondistended, NBS  Ext: no pedal edema bilaterally  Neuro: no new deficits  Musculoskeletal: No cyanosis, clubbing  Skin: No rashes  Psych: Normal affect and demeanor, alert and oriented x3    Data Reviewed:  I have  personally reviewed following labs and imaging studies  Micro Results Recent Results (from the past 240 hour(s))  MRSA PCR Screening     Status: None   Collection Time: 11/02/19  5:30 AM   Specimen: Nasopharyngeal  Result Value Ref Range Status   MRSA by PCR NEGATIVE NEGATIVE Final    Comment:        The GeneXpert MRSA Assay (FDA approved for NASAL specimens only), is one component of a comprehensive MRSA colonization surveillance program. It is not intended to diagnose MRSA infection nor to guide or monitor treatment for MRSA infections. Performed at [facility], [place] [address], [place], [place] [address]     Radiology Reports CT CHEST W CONTRAST  Result Date: 10/23/2019 CLINICAL DATA:  Seizure, abnormal CT abdomen with basilar lung changes EXAM: CT CHEST WITH CONTRAST TECHNIQUE: Multidetector CT imaging of the chest was performed during intravenous contrast administration. Sagittal and coronal MPR images reconstructed from axial data set. CONTRAST:  67mL OMNIPAQUE IOHEXOL 300 MG/ML  SOLN IV COMPARISON:  CT abdomen and pelvis 10/21/2019 FINDINGS: Cardiovascular: Atherosclerotic calcification aorta, proximal great vessels and coronary arteries. Aorta normal caliber. Heart normal size. No pericardial effusion. Mediastinum/Nodes: Esophagus unremarkable. Base of cervical region normal appearance. No thoracic adenopathy. Lungs/Pleura: Linear areas of atelectasis versus scarring scattered in both lungs. Patchy areas of peribronchovascular density are seen in both lungs, some associated with minimal bronchiectasis, favor post inflammatory. Central peribronchial thickening present. No segmental consolidation, pleural effusion or pneumothorax. 3 mm LEFT upper lobe nodule image 30. No additional pulmonary mass/nodule. Upper Abdomen: Stomach distended by food debris. Remaining upper abdomen unremarkable Musculoskeletal: No osseous abnormalities. Scattered chest wall and axillary soft tissue  edema, nonspecific. IMPRESSION: Scattered areas of atelectasis versus scarring in both lungs with few scattered areas of peribronchovascular opacity bilaterally, a few of which are associated with minimal bronchiectasis, favor postinflammatory scarring. Single 3 mm nonspecific LEFT upper lobe nodule. Short-term follow-up CT recommended in 3 months to reassess the areas of suspected scarring to ensure stability. Aortic Atherosclerosis (ICD10-I70.0). Electronically Signed   By: Lavonia Dana M.D.   On: 10/23/2019 13:00   MR BRAIN WO CONTRAST  Result Date: 10/19/2019 CLINICAL DATA:  Stroke follow-up. Left-sided weakness. Hypoglycemia. Witnessed seizures by EMS. Clinically suspected Todd's paralysis. EXAM: MRI HEAD WITHOUT CONTRAST TECHNIQUE: Multiplanar, multiecho pulse sequences of the brain and surrounding structures were obtained without intravenous contrast. COMPARISON:  Head CT 10/19/2019 FINDINGS: The study is  mildly motion degraded. Brain: There is mild restricted diffusion involving cortex in the medial right frontal lobe, right insula, and likely right hippocampus which does not conform to a vascular territory and is without associated edema. No intracranial hemorrhage, mass, midline shift, or extra-axial fluid collection is identified. No significant white matter disease is seen for age. The ventricles and sulci are normal. Vascular: Major intracranial vascular flow voids are preserved. Skull and upper cervical spine: Unremarkable bone marrow signal para Sinuses/Orbits: Bilateral cataract extraction. Small mucous retention cysts in the maxillary sinuses. Clear mastoid air cells. Other: None. IMPRESSION: Mild restricted diffusion in the medial right frontal lobe, right insula, and likely right hippocampus most consistent with recent seizure activity. Electronically Signed   By: Logan Bores M.D.   On: 10/19/2019 16:48   MR BRAIN W CONTRAST  Result Date: 10/20/2019 CLINICAL DATA:  Seizure.  Abnormal MRI.  EXAM: MRI HEAD WITH CONTRAST TECHNIQUE: Multiplanar, multiecho pulse sequences of the brain and surrounding structures were obtained with intravenous contrast. CONTRAST:  55mL GADAVIST GADOBUTROL 1 MMOL/ML IV SOLN COMPARISON:  MRI head without contrast 05/21/2019 FINDINGS: Brain: Review of the MRI from yesterday reveals restricted diffusion in the right medial frontal cortex, also in the right insula and right hippocampus. Note that the lesions are best seen on diffusion-weighted imaging with no significant associated FLAIR cortical edema. These findings are unilateral. No abnormal enhancement on today's study. Ventricle size is normal. Diffusion-weighted imaging was not repeated today IMPRESSION: Normal enhancement of the brain. Based on the findings yesterday, differential includes seizure related cortical activity, autoimmune related encephalitis, and less likely viral encephalitis. Consider lumbar puncture. Short-term follow-up MRI with contrast may be helpful. Electronically Signed   By: Franchot Gallo M.D.   On: 10/20/2019 16:40   CT ABDOMEN PELVIS W CONTRAST  Result Date: 10/22/2019 CLINICAL DATA:  Unintended weight loss. EXAM: CT ABDOMEN AND PELVIS WITH CONTRAST TECHNIQUE: Multidetector CT imaging of the abdomen and pelvis was performed using the standard protocol following bolus administration of intravenous contrast. CONTRAST:  166mL OMNIPAQUE IOHEXOL 300 MG/ML  SOLN COMPARISON:  None. FINDINGS: Lower chest: Majority of the chest is included in the field of view. Scattered linear and bandlike opacities throughout both lungs are typical of scarring. There is a subpleural 5 mm left upper lobe pulmonary nodule, series 6, image 26. Scattered peribronchovascular irregular opacities in the right lower lobe series 6, image 53, right middle lobe, series 6, image 39, left lower lobe, same image, left upper lobe series 6, image 29, and right mid lung same image. No dominant pulmonary mass. Hepatobiliary:  Scattered subcentimeter hypodensities in the liver are too small to characterize but likely small cysts or biliary hamartomas. No evidence of suspicious liver lesion. Gallbladder physiologically distended, no calcified stone. No biliary dilatation. Pancreas: No ductal dilatation or inflammation. Spleen: Normal in size without focal abnormality. Adrenals/Urinary Tract: Normal adrenal glands. There is right hydroureteronephrosis. The ureter is dilated to pelvic brim. There is mild left hydronephrosis and proximal hydroureter, with similar ureteral dilatation. Diminished excretion on delayed phase imaging, right greater than left. No evidence of focal renal mass. Urinary bladder is displaced anteriorly by large stool ball in the rectum. Stomach/Bowel: Large inspissated stool ball distends the rectum with rectal distention of 8.6 cm. Associated rectal wall thickening and perirectal edema. No pneumatosis. Approximately 6.3 cm length area of sigmoid colonic wall thickening with pericolonic edema just proximal to the large volume of rectosigmoid stool. Large volume of stool throughout the remainder of the  colon, there is significant sigmoid colonic redundancy. No additional areas of colonic wall thickening. The appendix is not confidently visualized. The stomach is unremarkable. No small bowel obstruction or abnormal distention. No definite small bowel inflammation. Vascular/Lymphatic: Moderate aorto bi-iliac atherosclerosis, advanced for age. No aortic aneurysm. Patent portal vein. There is no adenopathy in the abdomen or pelvis. Reproductive: Calcified uterine fibroids. The uterus is displaced into the right pelvis and anteriorly by large rectosigmoid stool burden. No evidence of adnexal mass. Other: Perirectal and distal pericolonic edema with small amount of presacral fluid. No other ascites. There is no free air or perforation. Mild generalized body wall edema. Musculoskeletal: Mild L1 superior endplate compression  fracture, age indeterminate. No evidence of focal bone lesion. IMPRESSION: 1. Large inspissated stool ball distends the rectum with rectal distention of 8.6 cm. Associated rectal wall thickening and perirectal edema, suspicious for fecal impaction. The stool distended rectum causes mass effect on the urinary bladder, uterus, and distal ureters with bilateral hydroureteronephrosis, right greater than left. 2. Approximately 6.3 cm length area of sigmoid colonic wall thickening with pericolonic edema just proximal to the large volume of rectosigmoid stool. This may represent focal colitis, including stercoral colitis versus colonic neoplasm. 3. Large volume of stool throughout the remainder of the colon. There is significant sigmoid colonic redundancy. 4. Scattered peribronchovascular irregular opacities in the lung bases, likely infectious or inflammatory. There is a 5 mm subpleural left upper lobe pulmonary nodule. No follow-up needed if patient is low-risk (and has no known or suspected primary neoplasm). Non-contrast chest CT can be considered in 12 months if patient is high-risk. This recommendation follows the consensus statement: Guidelines for Management of Incidental Pulmonary Nodules Detected on CT Images: From the Fleischner Society 2017; Radiology 2017; 284:228-243. 5. Mild L1 superior endplate compression fracture, age indeterminate. Aortic Atherosclerosis (ICD10-I70.0). Electronically Signed   By: Keith Rake M.D.   On: 10/22/2019 00:00   DG CHEST PORT 1 VIEW  Result Date: 10/19/2019 CLINICAL DATA:  59 year old female with left-sided weakness. Concern for stroke. EXAM: PORTABLE CHEST 1 VIEW COMPARISON:  Chest radiograph dated 01/14/2005. FINDINGS: No focal consolidation, pleural effusion or pneumothorax. The cardiac silhouette is within limits. No acute osseous pathology. IMPRESSION: No active disease. Electronically Signed   By: Anner Crete M.D.   On: 10/19/2019 19:28   EEG  adult  Result Date: 10/19/2019 Lora Havens, MD     10/19/2019  8:23 PM Patient Name: Anneke Cundy MRN: [id number] Epilepsy Attending: Lora Havens Referring Physician/Provider: Dr Amie [place] Date: 10/19/2019 Duration: 30.57 mins Patient history: 59yo F who presented with ams and seizure. EEG to evaluate for seizure, Level of alertness: Awake AEDs during EEG study:  keppra Technical aspects: This EEG study was done with scalp electrodes positioned according to the 10-20 International system of electrode placement. Electrical activity was acquired at a sampling rate of 500Hz  and reviewed with a high frequency filter of 70Hz  and a low frequency filter of 1Hz . EEG data were recorded continuously and digitally stored. Description: No posterior dominant rhythm was seen. EEG showed generalize, maximal bifrontal periodic epileptiform discharges with triphasic morphology at 0.25-0.5Hz . EEG also showed continuous generalized 8-9Hz  alpha activity.  Hyperventilation and photic stimulation were not performed.   ABNORMALITY -Periodic epileptiform discharges with triphasic morphology, generalized, maximal bifrontal IMPRESSION: This study showed evidence of generalized epileptogenicity with epileptiform discharges at 0.25-0.5hz  which is on the ictal-interictal continuum. No definite seizures were seen throughout the recording. Dr Cheral Marker was notified. Priyanka Barbra Sarks  Overnight EEG with video  Result Date: 10/20/2019 Lora Havens, MD     10/21/2019  1:03 PM Patient Name: Mackinze Criado MRN: [id number] Epilepsy Attending: Lora Havens Referring Physician/Provider: Dr Milus Banister Duration: 10/19/2019 2027 to 10/20/2019 2027  Patient history: 59yo F who presented with ams and seizure. EEG to evaluate for seizure,  Level of alertness: Awake  AEDs during EEG study:  keppra  Technical aspects: This EEG study was done with scalp electrodes positioned according to the 10-20 International system of electrode  placement. Electrical activity was acquired at a sampling rate of 500Hz  and reviewed with a high frequency filter of 70Hz  and a low frequency filter of 1Hz . EEG data were recorded continuously and digitally stored.  Description: No posterior dominant rhythm was seen. EEG initially showed generalized, maximal bifrontal periodic epileptiform discharges with triphasic morphology at 0.25-0.5Hz . Gradually, eeg showed right frontocentral periodic epileptiform discharges at 1Hz . EEG also showed continuous generalized 8-9Hz  alpha activity. Event button was pressed multiple times during which patient was noted to have left side face twitching, lasting about 30 seconds to one minute. Concomitant eeg showed rhythmic sharply contoured 5-9h generalized theta-alpha activity which evolved into 3-5Hz  theta-delta activity.  ABNORMALITY - Seizure, generalized - Periodic epileptiform discharges with triphasic morphology, generalized, maximal bifrontal - Continuous slow, generalized  IMPRESSION: This study showed multiple seizures with generalized onset during which patient was noted to have left face twitching, lasting 30 seconds to one minute. Additionally, there are epileptiform discharges at 0.25-0.5hz  which is on the ictal-interictal continuum as well as moderate diffuse encephalopathy, non specific to etiology.  Lora Havens   CT HEAD CODE STROKE WO CONTRAST  Result Date: 10/19/2019 CLINICAL DATA:  Code stroke.  Left-sided weakness. EXAM: CT HEAD WITHOUT CONTRAST TECHNIQUE: Contiguous axial images were obtained from the base of the skull through the vertex without intravenous contrast. COMPARISON:  None. FINDINGS: The study is mildly motion degraded. Brain: Within limitations of motion, no acute infarct, intracranial hemorrhage, mass, midline shift, or extra-axial fluid collection is identified. The ventricles and sulci are normal. Vascular: Calcified atherosclerosis at the skull base. No hyperdense vessel. Skull:  No fracture or suspicious osseous lesion. Sinuses/Orbits: Partially visualized mild mucosal thickening in the left maxillary sinus. Bilateral cataract extraction. Other: None. ASPECTS [facility] Stroke Program Early CT Score) - Ganglionic level infarction (caudate, lentiform nuclei, internal capsule, insula, M1-M3 cortex): 7 - Supraganglionic infarction (M4-M6 cortex): 3 Total score (0-10 with 10 being normal): 10 IMPRESSION: 1. No evidence of acute intracranial abnormality within limitations of mild motion. 2. ASPECTS is 10. These results were communicated to Dr. Rory Percy at 3:50 pm on 10/19/2019 by text page via the [facility] messaging system. Electronically Signed   By: Logan Bores M.D.   On: 10/19/2019 15:50    Lab Data:  CBC: Recent Labs  Lab 10/27/19 0240 10/29/19 0737 10/30/19 0327 11/02/19 0116  WBC 8.5 8.6 8.7 5.5  NEUTROABS 6.5  --   --   --   HGB 9.6* 9.7* 10.2* 10.1*  HCT 29.4* 30.0* 31.5* 30.2*  MCV 87.0 88.5 89.5 88.6  PLT 491* 472* 466* 270   Basic Metabolic Panel: Recent Labs  Lab 10/28/19 0343 10/29/19 0737 10/30/19 0327 10/31/19 0535 11/02/19 0116  NA 139 141 139 141 140  K 3.1* 3.6 4.3 3.4* 3.3*  CL 105 104 103 105 106  CO2 26 28 28 27 27   GLUCOSE 56* 75 117* 134* 110*  BUN 13 7 5* 7 7  CREATININE 0.59  0.59 0.60 0.53 0.53  CALCIUM 8.4* 8.3* 8.5* 8.5* 8.8*   GFR: Estimated Creatinine Clearance: 64.8 mL/min (by C-G formula based on SCr of 0.53 mg/dL). Liver Function Tests: No results for input(s): AST, ALT, ALKPHOS, BILITOT, PROT, ALBUMIN in the last 168 hours. No results for input(s): LIPASE, AMYLASE in the last 168 hours. No results for input(s): AMMONIA in the last 168 hours. Coagulation Profile: No results for input(s): INR, PROTIME in the last 168 hours. Cardiac Enzymes: No results for input(s): CKTOTAL, CKMB, CKMBINDEX, TROPONINI in the last 168 hours. BNP (last 3 results) No results for input(s): PROBNP in the last 8760 hours. HbA1C: No results for  input(s): HGBA1C in the last 72 hours. CBG: Recent Labs  Lab 11/01/19 2325 11/02/19 0356 11/02/19 0844 11/02/19 0918 11/02/19 1112  GLUCAP 99 135* 122* 108* 191*   Lipid Profile: No results for input(s): CHOL, HDL, LDLCALC, TRIG, CHOLHDL, LDLDIRECT in the last 72 hours. Thyroid Function Tests: No results for input(s): TSH, T4TOTAL, FREET4, T3FREE, THYROIDAB in the last 72 hours. Anemia Panel: No results for input(s): VITAMINB12, FOLATE, FERRITIN, TIBC, IRON, RETICCTPCT in the last 72 hours. Urine analysis:    Component Value Date/Time   COLORURINE COLORLESS (A) 10/08/2019 1848   APPEARANCEUR CLEAR (A) 10/08/2019 1848   LABSPEC 1.017 10/08/2019 1848   PHURINE 6.0 10/08/2019 1848   GLUCOSEU >=500 (A) 10/08/2019 1848   HGBUR NEGATIVE 10/08/2019 1848   BILIRUBINUR NEGATIVE 10/08/2019 1848   KETONESUR NEGATIVE 10/08/2019 1848   PROTEINUR NEGATIVE 10/08/2019 1848   NITRITE NEGATIVE 10/08/2019 1848   LEUKOCYTESUR NEGATIVE 10/08/2019 1848     Mishawn Hemann M.D. [facility] 11/02/2019, 12:24 PM   Call night coverage person covering after 7pm

## 2019-11-02 NOTE — Anesthesia Procedure Notes (Signed)
Procedure Name: MAC Date/Time: 11/02/2019 8:05 AM Performed by: Renato Shin, CRNA Pre-anesthesia Checklist: Patient identified, Emergency Drugs available and Suction available Patient Re-evaluated:Patient Re-evaluated prior to induction Oxygen Delivery Method: Nasal cannula Preoxygenation: Pre-oxygenation with 100% oxygen Induction Type: IV induction Placement Confirmation: positive ETCO2 and breath sounds checked- equal and bilateral Dental Injury: Teeth and Oropharynx as per pre-operative assessment

## 2019-11-02 NOTE — Progress Notes (Signed)
Physical Therapy Treatment Patient Details Name: Andrea [name] MRN: [id number] DOB: 1960-05-30 Today's Date: 11/02/2019    History of Present Illness 59 y.o. female with past medical history significant for diabetes insulin-dependent, hyperlipidemia, hypothyroidism, who was just evaluated at [facility], ER on 6/19 for hypoglycemia of 24, patient was initially nonverbal with generalized weakness at that time.  Hypoglycemia was corrected and she was discharged home.  Today family called EMS because patient's blood sugar was again low at 30.  Patient was also noted to have  left-sided weakness.  Patient was last seen normal at 2 AM.  When EMS was on the scene patient had 2 focal seizures.  Patient received IV Versed and glucose.  On arrival to the ED patient had still mild left-sided weakness and dysarthria. Pt underwent colonscopy on 7/4.    PT Comments    Pt tolerated treatment well, demonstrating improved transfer quality. Pt remains able to walk household and limited community distances with use of RW, but does require supervision due to safety awareness deficits. Pt demonstrates improved dynamic balance, standing unsupported to perform hygiene tasks. Pt will benefit from continued acute PT services to restore independence in mobility. Pt may also benefit from stair negotiation assessment, pt reports not having stairs to negotiate at home but stair goal is present in chart (may need to confirm home setup with family).  Follow Up Recommendations  Home health PT;Supervision/Assistance - 24 hour     Equipment Recommendations  Rolling walker with 5" wheels;3in1 (PT)    Recommendations for Other Services       Precautions / Restrictions Precautions Precautions: Fall Precaution Comments: seizure Restrictions Weight Bearing Restrictions: No    Mobility  Bed Mobility Overal bed mobility: Modified Independent Bed Mobility: Supine to Sit;Sit to Supine     Supine to sit: Modified independent  (Device/Increase time) Sit to supine: Modified independent (Device/Increase time)      Transfers Overall transfer level: Needs assistance Equipment used: None;Rolling walker (2 wheeled) Transfers: Sit to/from Stand Sit to Stand: Supervision Stand pivot transfers: Supervision          Ambulation/Gait Ambulation/Gait assistance: Supervision Gait Distance (Feet): 200 Feet Assistive device: Rolling walker (2 wheeled) Gait Pattern/deviations: Step-through pattern Gait velocity: reduced Gait velocity interpretation: <1.8 ft/sec, indicate of risk for recurrent falls General Gait Details: pt with slowed step through gait, does bump into multiple objects during walk but is able to self-correct after doing so and does not lose balance when doing so   Stairs             Wheelchair Mobility    Modified Rankin (Stroke Patients Only) Modified Rankin (Stroke Patients Only) Pre-Morbid Rankin Score: No symptoms Modified Rankin: Moderately severe disability     Balance Overall balance assessment: Needs assistance Sitting-balance support: No upper extremity supported;Feet supported Sitting balance-Leahy Scale: Good     Standing balance support: No upper extremity supported;During functional activity Standing balance-Leahy Scale: Good Standing balance comment: hygiene tasks                            Cognition Arousal/Alertness: Awake/alert Behavior During Therapy: WFL for tasks assessed/performed Overall Cognitive Status: No family/caregiver present to determine baseline cognitive functioning                                 General Comments: Slow processing      Exercises  General Comments General comments (skin integrity, edema, etc.): VSS on RA      Pertinent Vitals/Pain Pain Assessment: No/denies pain    Home Living                      Prior Function            PT Goals (current goals can now be found in the care  plan section) Acute Rehab PT Goals Patient Stated Goal: None stated this session Progress towards PT goals: Progressing toward goals    Frequency    Min 3X/week      PT Plan Current plan remains appropriate    Co-evaluation              AM-PAC PT "6 Clicks" Mobility   Outcome Measure  Help needed turning from your back to your side while in a flat bed without using bedrails?: None Help needed moving from lying on your back to sitting on the side of a flat bed without using bedrails?: None Help needed moving to and from a bed to a chair (including a wheelchair)?: None Help needed standing up from a chair using your arms (e.g., wheelchair or bedside chair)?: None Help needed to walk in hospital room?: None Help needed climbing 3-5 steps with a railing? : A Little 6 Click Score: 23    End of Session   Activity Tolerance: Patient tolerated treatment well Patient left: in bed;with call bell/phone within reach;with bed alarm set;with nursing/sitter in room Nurse Communication: Mobility status PT Visit Diagnosis: Unsteadiness on feet (R26.81);Other abnormalities of gait and mobility (R26.89);Muscle weakness (generalized) (M62.81);Other symptoms and signs involving the nervous system (R29.898)     Time: 7096-2836 PT Time Calculation (min) (ACUTE ONLY): 20 min  Charges:  $Gait Training: 8-22 mins                     Zenaida Niece, PT, DPT Acute Rehabilitation Pager: [phone]    Zenaida Niece 11/02/2019, 2:33 PM

## 2019-11-02 NOTE — Anesthesia Postprocedure Evaluation (Signed)
Anesthesia Post Note  Patient: Andrea [name]  Procedure(s) Performed: COLONOSCOPY WITH PROPOFOL (N/A ) POLYPECTOMY     Patient location during evaluation: PACU Anesthesia Type: MAC Level of consciousness: awake and alert Pain management: pain level controlled Vital Signs Assessment: post-procedure vital signs reviewed and stable Respiratory status: spontaneous breathing, nonlabored ventilation, respiratory function stable and patient connected to nasal cannula oxygen Cardiovascular status: stable and blood pressure returned to baseline Postop Assessment: no apparent nausea or vomiting Anesthetic complications: no   No complications documented.  Last Vitals:  Vitals:   11/02/19 0845 11/02/19 0900  BP: (!) 86/57 113/67  Pulse: (!) 101 98  Resp: 20 18  Temp: 36.7 C 36.7 C  SpO2: 100% 100%    Last Pain:  Vitals:   11/02/19 0845  TempSrc:   PainSc: 0-No pain                 Kishana Battey P Leanette Eutsler

## 2019-11-02 NOTE — Op Note (Signed)
[facility] Patient Name: Andrea [name] Procedure Date : 11/02/2019 MRN: [id number] Attending MD: [name] , MD Date of Birth: [birth year] CSN: [id number] Age: 59 Admit Type: Inpatient Procedure:                Colonoscopy Indications:              Abnormal CT of the GI tract, Change in bowel                            habits, Weight loss Providers:                [name], MD, Doristine Johns, RN, Laverda Sorenson, Technician, Luane School, CRNA Referring MD:             [facility] Medicines:                Monitored Anesthesia Care Complications:            No immediate complications. Estimated blood loss:                            Minimal. Estimated Blood Loss:     Estimated blood loss was minimal. Procedure:                Pre-Anesthesia Assessment:                           - Prior to the procedure, a History and Physical                            was performed, and patient medications and                            allergies were reviewed. The patient's tolerance of                            previous anesthesia was also reviewed. The risks                            and benefits of the procedure and the sedation                            options and risks were discussed with the patient.                            All questions were answered, and informed consent                            was obtained. Prior Anticoagulants: The patient has                            taken no previous anticoagulant or antiplatelet  agents. ASA Grade Assessment: III - A patient with                            severe systemic disease. After reviewing the risks                            and benefits, the patient was deemed in                            satisfactory condition to undergo the procedure.                           After obtaining informed consent, the colonoscope                            was passed under  direct vision. Throughout the                            procedure, the patient's blood pressure, pulse, and                            oxygen saturations were monitored continuously. The                            PCF-H190DL ([id number] Olympus pediatric colonscope                            was introduced through the anus and advanced to the                            the terminal ileum. The colonoscopy was performed                            without difficulty. The patient tolerated the                            procedure well. The quality of the bowel                            preparation was fair. Scope In: 8:10:43 AM Scope Out: 4:54:09 AM Scope Withdrawal Time: 0 hours 19 minutes 1 second  Total Procedure Duration: 0 hours 26 minutes 4 seconds  Findings:      The perianal and digital rectal examinations were normal.      A 8 mm polyp was found in the descending colon. The polyp was sessile.       The polyp was removed with a hot snare. Resection and retrieval were       complete.      A localized area of severely ulcerated mucosa was found in the       recto-sigmoid colon. Biopsies were taken with a cold forceps for       histology. Likely stercoral ulcer.      Scattered small and large-mouthed diverticula were found in the sigmoid       colon, descending colon and ascending colon.  The terminal ileum appeared normal.      Non-bleeding internal hemorrhoids were found during retroflexion. The       hemorrhoids were medium-sized. Impression:               - Preparation of the colon was fair.                           - One 8 mm polyp in the descending colon, removed                            with a hot snare. Resected and retrieved.                           - Ulcerated mucosa in the recto-sigmoid colon.                            Biopsied.                           - Diverticulosis in the sigmoid colon, in the                            descending colon and in the ascending  colon.                           - The examined portion of the ileum was normal.                           - Non-bleeding internal hemorrhoids. Moderate Sedation:      Patient did not receive moderate sedation for this procedure, but       instead received monitored anesthesia care. Recommendation:           - High fiber diet.                           - Continue present medications.                           - Await pathology results.                           - Repeat colonoscopy for surveillance based on                            pathology results.                           - Bulk forming agents like                            metamucil/benefiber/psyllium husk daily. Procedure Code(s):        --- Professional ---                           [phone], Colonoscopy, flexible; with removal of  tumor(s), polyp(s), or other lesion(s) by snare                            technique                           45380, 59, Colonoscopy, flexible; with biopsy,                            single or multiple Diagnosis Code(s):        --- Professional ---                           K64.8, Other hemorrhoids                           K63.5, Polyp of colon                           K63.3, Ulcer of intestine                           R19.4, Change in bowel habit                           R63.4, Abnormal weight loss                           K57.30, Diverticulosis of large intestine without                            perforation or abscess without bleeding                           R93.3, Abnormal findings on diagnostic imaging of                            other parts of digestive tract CPT copyright 2019 [employer]. All rights reserved. The codes documented in this report are preliminary and upon coder review may  be revised to meet current compliance requirements. [name], MD 11/02/2019 8:43:09 AM This report has been signed electronically. Number of Addenda:  0

## 2019-11-02 NOTE — Brief Op Note (Signed)
10/19/2019 - 11/02/2019  8:45 AM  PATIENT:  Andrea [name]  59 y.o. female  PRE-OPERATIVE DIAGNOSIS:  abnormal CT abdomen, possible sigmoid mass  POST-OPERATIVE DIAGNOSIS:  polyp in desending colon treated with hot snare, rectal sigmoid ulcer biopsy   PROCEDURE:  Procedure(s): COLONOSCOPY WITH PROPOFOL (N/A) POLYPECTOMY  SURGEON:  Surgeon(s) and Role:    [name], MD - Primary  PHYSICIAN ASSISTANT:   ASSISTANTS: Doristine Johns, RN, Laverda Sorenson, Tech  ANESTHESIA:   MAC  EBL:  None  BLOOD ADMINISTERED:none  DRAINS: none   LOCAL MEDICATIONS USED:  NONE  SPECIMEN:  Biopsy / Limited Resection  DISPOSITION OF SPECIMEN:  PATHOLOGY  COUNTS:  YES  TOURNIQUET:  * No tourniquets in log *  DICTATION: .Dragon Dictation  PLAN OF CARE: Admit to inpatient   PATIENT DISPOSITION:  PACU - hemodynamically stable.   Delay start of Pharmacological VTE agent (>24hrs) due to surgical blood loss or risk of bleeding: not applicable

## 2019-11-02 NOTE — Op Note (Signed)
Colonoscopy was performed today for abnormal CAT scan, weight loss and change in bowel habits.   Findings: Prep was fair. Normal terminal ileum. Scattered colonic diverticulosis. Descending polyp, removed. Rectosigmoid ulcer, likely stercoral.  Biopsies taken. Internal hemorrhoids.   Recommendations: High-fiber diet. Bulk forming agents like Metamucil/Benefiber/psyllium husk on a daily basis. Pathology may be followed as outpatient.  We will sign off from GI standpoint, please recall if needed.  [name], MD

## 2019-11-02 NOTE — Anesthesia Preprocedure Evaluation (Addendum)
Anesthesia Evaluation  Patient identified by MRN, date of birth, ID band Patient awake    Reviewed: Allergy & Precautions, NPO status , Patient's Chart, lab work & pertinent test results  Airway Mallampati: III  TM Distance: >3 FB Neck ROM: Full    Dental  (+) Missing,    Pulmonary neg pulmonary ROS,    Pulmonary exam normal breath sounds clear to auscultation       Cardiovascular hypertension, Pt. on medications Normal cardiovascular exam Rhythm:Regular Rate:Normal  ECG: a-fib, rate 111   Neuro/Psych Seizures -,  negative psych ROS   GI/Hepatic negative GI ROS, Neg liver ROS,   Endo/Other  diabetes, Insulin DependentHypothyroidism   Renal/GU negative Renal ROS     Musculoskeletal negative musculoskeletal ROS (+)   Abdominal   Peds  Hematology  (+) anemia , HLD   Anesthesia Other Findings abnormal CT abdomen possible sigmoid mass  Reproductive/Obstetrics hcg negative                           Anesthesia Physical Anesthesia Plan  ASA: III  Anesthesia Plan: MAC   Post-op Pain Management:    Induction: Intravenous  PONV Risk Score and Plan: 2 and Propofol infusion and Treatment may vary due to age or medical condition  Airway Management Planned: Simple Face Mask  Additional Equipment:   Intra-op Plan:   Post-operative Plan:   Informed Consent: I have reviewed the patients History and Physical, chart, labs and discussed the procedure including the risks, benefits and alternatives for the proposed anesthesia with the patient or authorized representative who has indicated his/her understanding and acceptance.     Dental advisory given  Plan Discussed with: CRNA  Anesthesia Plan Comments:         Anesthesia Quick Evaluation

## 2019-11-03 ENCOUNTER — Encounter (HOSPITAL_COMMUNITY): Payer: Self-pay | Admitting: Gastroenterology

## 2019-11-03 DIAGNOSIS — K633 Ulcer of intestine: Secondary | ICD-10-CM

## 2019-11-03 DIAGNOSIS — R6881 Early satiety: Secondary | ICD-10-CM

## 2019-11-03 DIAGNOSIS — E1069 Type 1 diabetes mellitus with other specified complication: Secondary | ICD-10-CM

## 2019-11-03 LAB — BASIC METABOLIC PANEL
Anion gap: 8 (ref 5–15)
BUN: 12 mg/dL (ref 6–20)
CO2: 26 mmol/L (ref 22–32)
Calcium: 8.6 mg/dL — ABNORMAL LOW (ref 8.9–10.3)
Chloride: 104 mmol/L (ref 98–111)
Creatinine, Ser: 0.66 mg/dL (ref 0.44–1.00)
GFR calc Af Amer: >60 mL/min (ref >60)
GFR calc non Af Amer: >60 mL/min (ref >60)
Glucose, Bld: 226 mg/dL — ABNORMAL HIGH (ref 70–99)
Potassium: 4 mmol/L (ref 3.5–5.1)
Sodium: 138 mmol/L (ref 135–145)

## 2019-11-03 LAB — GLUCOSE, CAPILLARY
Glucose-Capillary: 128 mg/dL — ABNORMAL HIGH (ref 70–99)
Glucose-Capillary: 159 mg/dL — ABNORMAL HIGH (ref 70–99)
Glucose-Capillary: 187 mg/dL — ABNORMAL HIGH (ref 70–99)
Glucose-Capillary: 213 mg/dL — ABNORMAL HIGH (ref 70–99)
Glucose-Capillary: 213 mg/dL — ABNORMAL HIGH (ref 70–99)
Glucose-Capillary: 217 mg/dL — ABNORMAL HIGH (ref 70–99)
Glucose-Capillary: 237 mg/dL — ABNORMAL HIGH (ref 70–99)

## 2019-11-03 LAB — CBC
HCT: 29.5 % — ABNORMAL LOW (ref 36.0–46.0)
Hemoglobin: 9.6 g/dL — ABNORMAL LOW (ref 12.0–15.0)
MCH: 29.4 pg (ref 26.0–34.0)
MCHC: 32.5 g/dL (ref 30.0–36.0)
MCV: 90.2 fL (ref 80.0–100.0)
Platelets: 279 10*3/uL (ref 150–400)
RBC: 3.27 MIL/uL — ABNORMAL LOW (ref 3.87–5.11)
RDW: 14.6 % (ref 11.5–15.5)
WBC: 5.9 10*3/uL (ref 4.0–10.5)
nRBC: 0 % (ref 0.0–0.2)

## 2019-11-03 NOTE — TOC Progression Note (Signed)
Transition of Care [facility]) - Progression Note    Patient Details  Name: Andrea [name] MRN: [id number] Date of Birth: 01-22-1961  [facility] CM/SW [place], LCSW Phone Number: 11/03/2019, 1:27 PM  Clinical Narrative:    CSW checked in with [facility]; they are not likely able to accept patient. No SNF bed offers currently. CSW expanded search.   Expected Discharge Plan: Skilled Nursing Facility Barriers to Discharge: SNF Pending bed offer  Expected Discharge Plan and Services Expected Discharge Plan: [place] Choice: [place] arrangements for the past 2 months: Single Family Home Expected Discharge Date: 10/27/19                                     Social Determinants of Health (SDOH) Interventions    Readmission Risk Interventions No flowsheet data found.

## 2019-11-03 NOTE — Progress Notes (Signed)
PROGRESS NOTE    Andrea [name]  QVZ:[id number]  DOB: 1960/06/22  PCP: [name], MD Admit date:10/19/2019 Chief compliant: Recurrent hypoglycemia with AMS 59 year old female with history of insulin-dependent diabetes mellitus, hyperlipidemia, hypothyroidism who was brought to the emergency department for evaluation of hypoglycemia. She was evaluated in the ED on 6/19 for blood glucose of 24, hypoglycemia was corrected and she was discharged home .  On 6/20, family called EMS as patient's blood glucose was again low at 30 and was noted to have dysarthria with left-sided weakness. When EMS arrived, patient had 2 focal seizures and was given IV Versed along with glucose. On presentation she still had mild left-sided weakness and dysarthria. She takes 70/30 insulin at home, her last dose before presentation was 80 units on the morning of 6/19. She did report recent increase in insulin dosage. ED Course: Afebrile,  Sodium 143, potassium 3.2, CO2 24, glucose 155, alkaline phosphatase 59, albumin 2.6, AST ALT normal, white blood cell 8.9, hemoglobin 10.2, platelet 329.  INR 1.1, CT head: No Acute abnormality.  MRI without contrast: Mild restricted diffusion in the medial right frontal lobe, right insula, and likely right hippocampus most consistent with recent seizure activity. Received additional amp of D50 due to BG 67 at the time of Hospitalist evaluation. Hospital course: Patient admitted to [facility] for further management with IV dextrose.  Started on Keppra and EEG ordered.Neurology was consulted.Hospital course remarkable for severe constipation.she developed twitching of face muscles on 09/2319. She underwent LP by neurology suspecting for autoimmune/inflammatory disorder. Started on high-dose steroids. PT recommending SNF on DC   Subjective:  Patient resting comfortably.  Denies any acute complaints.  Did undergo colonoscopy yesterday.  Niece at bedside.  Objective: Vitals:   11/02/19  2005 11/02/19 2345 11/03/19 0403 11/03/19 0746  BP: 111/72 107/76 112/60 120/80  Pulse: (!) 104 (!) 109 100 (!) 107  Resp: 17 16 18 16   Temp: 98.6 F (37 C) 98.8 F (37.1 C) 97.8 F (36.6 C) 98.7 F (37.1 C)  TempSrc: Oral Oral Oral Oral  SpO2: 100% 100% 98% 100%  Weight:        Intake/Output Summary (Last 24 hours) at 11/03/2019 1010 Last data filed at 11/02/2019 2005 Gross per 24 hour  Intake 100 ml  Output 250 ml  Net -150 ml   Filed Weights   10/31/19 0309  Weight: 54.2 kg    Physical Examination:  General: Moderately built, no acute distress noted Head ENT: Atraumatic normocephalic, PERRLA, neck supple Heart: S1-S2 heard, regular rate and rhythm, no murmurs.  No leg edema noted Lungs: Equal air entry bilaterally, no rhonchi or rales on exam, no accessory muscle use Abdomen: Bowel sounds heard, soft, nontender, nondistended. No organomegaly.  No CVA tenderness Extremities: No pedal edema.  No cyanosis or clubbing. Neurological: Awake alert oriented x3, no focal weakness or numbness, strength and sensations to crude touch intact Skin: No wounds or rashes.    Data Reviewed: I have personally reviewed following labs and imaging studies  CBC: Recent Labs  Lab 10/29/19 0737 10/30/19 0327 11/02/19 0116 11/03/19 0734  WBC 8.6 8.7 5.5 5.9  HGB 9.7* 10.2* 10.1* 9.6*  HCT 30.0* 31.5* 30.2* 29.5*  MCV 88.5 89.5 88.6 90.2  PLT 472* 466* 343 329   Basic Metabolic Panel: Recent Labs  Lab 10/29/19 0737 10/30/19 0327 10/31/19 0535 11/02/19 0116 11/03/19 0734  NA 141 139 141 140 138  K 3.6 4.3 3.4* 3.3* 4.0  CL 104 103 105 106  104  CO2 28 28 27 27 26   GLUCOSE 75 117* 134* 110* 226*  BUN 7 5* 7 7 12   CREATININE 0.59 0.60 0.53 0.53 0.66  CALCIUM 8.3* 8.5* 8.5* 8.8* 8.6*   GFR: Estimated Creatinine Clearance: 64.8 mL/min (by C-G formula based on SCr of 0.66 mg/dL). Liver Function Tests: No results for input(s): AST, ALT, ALKPHOS, BILITOT, PROT, ALBUMIN in the  last 168 hours. No results for input(s): LIPASE, AMYLASE in the last 168 hours. No results for input(s): AMMONIA in the last 168 hours. Coagulation Profile: No results for input(s): INR, PROTIME in the last 168 hours. Cardiac Enzymes: No results for input(s): CKTOTAL, CKMB, CKMBINDEX, TROPONINI in the last 168 hours. BNP (last 3 results) No results for input(s): PROBNP in the last 8760 hours. HbA1C: No results for input(s): HGBA1C in the last 72 hours. CBG: Recent Labs  Lab 11/02/19 1520 11/02/19 2006 11/03/19 0003 11/03/19 0403 11/03/19 0749  GLUCAP 212* 208* 187* 159* 213*   Lipid Profile: No results for input(s): CHOL, HDL, LDLCALC, TRIG, CHOLHDL, LDLDIRECT in the last 72 hours. Thyroid Function Tests: No results for input(s): TSH, T4TOTAL, FREET4, T3FREE, THYROIDAB in the last 72 hours. Anemia Panel: No results for input(s): VITAMINB12, FOLATE, FERRITIN, TIBC, IRON, RETICCTPCT in the last 72 hours. Sepsis Labs: No results for input(s): PROCALCITON, LATICACIDVEN in the last 168 hours.  Recent Results (from the past 240 hour(s))  MRSA PCR Screening     Status: None   Collection Time: 11/02/19  5:30 AM   Specimen: Nasopharyngeal  Result Value Ref Range Status   MRSA by PCR NEGATIVE NEGATIVE Final    Comment:        The GeneXpert MRSA Assay (FDA approved for NASAL specimens only), is one component of a comprehensive MRSA colonization surveillance program. It is not intended to diagnose MRSA infection nor to guide or monitor treatment for MRSA infections. Performed at [facility], [place] [address] [place], [place] [address]       Radiology Studies: No results found.    Scheduled Meds: . aspirin EC  81 mg Oral Daily  . bisacodyl  10 mg Rectal Once  . cloBAZam  5 mg Oral BID  . enoxaparin (LOVENOX) injection  40 mg Subcutaneous Q24H  . feeding supplement (PRO-STAT SUGAR FREE 64)  30 mL Oral BID  . insulin aspart  0-6 Units Subcutaneous Q4H  .  levETIRAcetam  1,500 mg Oral BID  . levothyroxine  150 mcg Oral Q0600  . liothyronine  5 mcg Oral Daily  . multivitamin with minerals  1 tablet Oral Daily  . Ensure Max Protein  11 oz Oral TID  . psyllium  1 packet Oral Daily  . rosuvastatin  20 mg Oral QHS  . senna-docusate  1 tablet Oral BID  . sodium chloride flush  3 mL Intravenous Once   Continuous Infusions:   Assessment/Plan:  1.  New onset seizures: In the setting of severe hypoglycemia.  EEG however, showed epileptiform discharges.  Neurology was consulted and patient started on Keppra/clobazam.  CT head and MRI findings as above.  Patient again had facial muscle twitching on 6/23.  She underwent LP,CSF IgG cell count, HSV, myelin basic protein, VDRL normal.  CSF culture negative.  Neurology started her on IV Solu-Medrol for possible autoimmune, inflammatory disorder or paraneoplastic disorder.  They recommended IV Solu-Medrol 1 g daily for 5 doses-completed on 6/27.  Remains on Keppra/clobazam while autoimmune and paraneoplastic labs sent to [facility].  Plan for neurology follow-up with Dr. Greggory Brandy in 6 weeks, possibly repeat MRI at that time.  2.  Type I diabetes mellitus, recurrent hypoglycemia: Patient reportedly was taking 70/30 insulin at 80 units every morning and 85 units every afternoon.  Insulin held on admission and patient treated with IV dextrose.  Blood glucose improved overall with no further hypoglycemia since July 2 (patient was having intermittent hypoglycemia prior to that with blood glucose dropping to 80s and 90s at times).  Only on sliding scale insulin at this time.  Continue to monitor and adjust insulin as needed.  Hemoglobin A1c at 12.4.  C-peptide and proinsulin level both low.  Random insulin level normal.  Seen by dietitian and placed on Ensure, Protostat nutritional supplements.  Follows endocrinologist Dr. Honor Junes  3.  Early satiety, abnormal CT scan:CT abdomen pelvis showed fecal impaction, bilateral  hydroureteronephrosis due to distended rectum, approximately 6.3 cm sigmoid colonic wall thickening may represent focal colitis including stercoral colitis versus colonic neoplasm, large volume of stool throughout the remainder of colon.Extensive GI work-up outpatient with [facility] (Dr [name]), had upper endoscopy on 04/09/2019 for the above complaints, which showed chronic gastritis with H. pylori.No metaplasia or dysplasia or carcinoma.CEA, CA 19-9, normal.  She had a normal GE study on 04/17/2019 which showed no gastroparesis  No recent colonoscopy although patient had flex sig 2016 which had shown diverticulosis with nonthrombosed external hemorrhoids, polyps, otherwise benign.  Patient seen by GI, and underwent inpatient colonoscopy on 7/4 revealing scattered diverticulosis, rectosigmoid ulcer likely stercoral.  Biopsies taken.  Internal hemorrhoids.  High-fiber diet and follow-up GI as outpatient recommended. .  4.  Hypothyroidism:Follows with endocrinology, Dr. Dina Rich, continue Synthroid, Cytomel  5. Left upper lobe nodule, 3 mm incidental finding-Repeat CT chest in 3 months, past smoker  6.Severe constipation-CT abdomen/pelvis findings noted, placed on Senokot-S, MiraLAX daily  7.Generalized debility, deconditioning-PT evaluation recommended today recommended home health, 24-hour supervision, 3 n1, DME rolling walker  8.Severe protein calorie malnutrition:Estimated body mass index is 18.71 kg/m .  Dietitian consulted  9.  Deconditioning, goals of care: Seen by physical therapy who recommended HH PT with 24-hour supervision and DME (rolling walker, 3 in 1) versus SNF.  Per granddaughter, Ms. Andrea [name] patient will not have 24/7 supervision at home hence requesting SNF for rehab.  Social work consult has been placed.  May benefit from palliative care evaluation.  Needs okay with consultation.  DVT prophylaxis: Lovenox Code Status: Full code Family / Patient Communication:  Granddaughter Disposition Plan:   Status is: Inpatient  Remains inpatient appropriate because:Unsafe d/c plan   Dispo: The patient is from: Home              Anticipated d/c is to: SNF              Anticipated d/c date is: 1 day              Patient currently is medically stable to d/c.            Time spent: 25 minutes     >50% time spent in discussions with care team and coordination of care.    Guilford Shi, MD [facility] Pager in [place]  If 7PM-7AM, please contact night-coverage [email or web address] 11/03/2019, 10:10 AM

## 2019-11-04 DIAGNOSIS — E43 Unspecified severe protein-calorie malnutrition: Secondary | ICD-10-CM

## 2019-11-04 LAB — CBC
HCT: 28.6 % — ABNORMAL LOW (ref 36.0–46.0)
Hemoglobin: 9.3 g/dL — ABNORMAL LOW (ref 12.0–15.0)
MCH: 29.3 pg (ref 26.0–34.0)
MCHC: 32.5 g/dL (ref 30.0–36.0)
MCV: 90.2 fL (ref 80.0–100.0)
Platelets: 247 10*3/uL (ref 150–400)
RBC: 3.17 MIL/uL — ABNORMAL LOW (ref 3.87–5.11)
RDW: 14.6 % (ref 11.5–15.5)
WBC: 6.8 10*3/uL (ref 4.0–10.5)
nRBC: 0 % (ref 0.0–0.2)

## 2019-11-04 LAB — BASIC METABOLIC PANEL
Anion gap: 9 (ref 5–15)
BUN: 19 mg/dL (ref 6–20)
CO2: 27 mmol/L (ref 22–32)
Calcium: 8.7 mg/dL — ABNORMAL LOW (ref 8.9–10.3)
Chloride: 103 mmol/L (ref 98–111)
Creatinine, Ser: 0.62 mg/dL (ref 0.44–1.00)
GFR calc Af Amer: >60 mL/min (ref >60)
GFR calc non Af Amer: >60 mL/min (ref >60)
Glucose, Bld: 203 mg/dL — ABNORMAL HIGH (ref 70–99)
Potassium: 3.8 mmol/L (ref 3.5–5.1)
Sodium: 139 mmol/L (ref 135–145)

## 2019-11-04 LAB — GLUCOSE, CAPILLARY
Glucose-Capillary: 137 mg/dL — ABNORMAL HIGH (ref 70–99)
Glucose-Capillary: 173 mg/dL — ABNORMAL HIGH (ref 70–99)
Glucose-Capillary: 186 mg/dL — ABNORMAL HIGH (ref 70–99)
Glucose-Capillary: 210 mg/dL — ABNORMAL HIGH (ref 70–99)
Glucose-Capillary: 215 mg/dL — ABNORMAL HIGH (ref 70–99)
Glucose-Capillary: 227 mg/dL — ABNORMAL HIGH (ref 70–99)
Glucose-Capillary: 322 mg/dL — ABNORMAL HIGH (ref 70–99)

## 2019-11-04 MED ORDER — FESOTERODINE FUMARATE ER 4 MG PO TB24
4.0000 mg | ORAL_TABLET | Freq: Every day | ORAL | Status: DC
  Administered 2019-11-04 – 2019-11-12 (×9): 4 mg via ORAL
  Filled 2019-11-04 (×9): qty 1

## 2019-11-04 MED ORDER — PSYLLIUM 95 % PO PACK
1.0000 | PACK | Freq: Two times a day (BID) | ORAL | Status: DC
  Administered 2019-11-04 – 2019-11-10 (×12): 1 via ORAL
  Filled 2019-11-04 (×19): qty 1

## 2019-11-04 MED ORDER — ENSURE ENLIVE PO LIQD
237.0000 mL | Freq: Three times a day (TID) | ORAL | Status: DC
  Administered 2019-11-04 – 2019-11-12 (×21): 237 mL via ORAL

## 2019-11-04 MED ORDER — INSULIN DETEMIR 100 UNIT/ML ~~LOC~~ SOLN
5.0000 [IU] | Freq: Two times a day (BID) | SUBCUTANEOUS | Status: DC
  Administered 2019-11-04 – 2019-11-05 (×3): 5 [IU] via SUBCUTANEOUS
  Filled 2019-11-04 (×4): qty 0.05

## 2019-11-04 MED ORDER — VITAMIN D 25 MCG (1000 UNIT) PO TABS
5000.0000 [IU] | ORAL_TABLET | Freq: Every day | ORAL | Status: DC
  Administered 2019-11-04 – 2019-11-12 (×9): 5000 [IU] via ORAL
  Filled 2019-11-04 (×9): qty 5

## 2019-11-04 MED ORDER — LEVONORGESTREL 20 MCG/24HR IU IUD: 1.0000 | INTRAUTERINE_SYSTEM | Freq: Once | INTRAUTERINE | Status: DC

## 2019-11-04 NOTE — Progress Notes (Signed)
Nutrition Follow-up  DOCUMENTATION CODES:   Severe malnutrition in context of chronic illness  INTERVENTION:   Ensure Enlive po TID, each supplement provides 350 kcal and 20 grams of protein  D/C Ensure Max  Continue MVI with Minerals  NUTRITION DIAGNOSIS:   Severe Malnutrition related to chronic illness as evidenced by percent weight loss, severe muscle depletion, severe fat depletion.  Being addressed via supplements  GOAL:   Patient will meet greater than or equal to 90% of their needs  Progressing   MONITOR:   PO intake, Supplement acceptance, Labs, I & O's, Diet advancement, Weight trends  REASON FOR ASSESSMENT:   Consult Assessment of nutrition requirement/status  ASSESSMENT:   Pt presented with hypoglycemia, then had 2 focal seizures and was admitted. Hospital course remarkable for severe constipation, LP suspicious of autoimmune/inflammatory disorder, and an abnormal CT concerning for colonic neoplasm. PMH includes DM, HLD, hypothyroidism.  7/4 Colonoscopy revealing scattered diverticulosis, rectosigmoid ulcer likely stercoral, biopsies taken  GI recommending high fiber diet and added psyllium  Recorded po intake 0-100% of meals. Pt ate 100% of lunch today which consisted of a chef salad and juice. Pt reports appetite is good and that she was eating well but still losing weight prior to admission  Severe constipation on admission, on bowel regimen, improved  Labs: CBGs 137-322 Meds: Vit D, ss novolog, levemir, MVI with Minerals, psyllium  NUTRITION - FOCUSED PHYSICAL EXAM:    Most Recent Value  Orbital Region Moderate depletion  Upper Arm Region Severe depletion  Thoracic and Lumbar Region Severe depletion  Buccal Region Moderate depletion  Temple Region Moderate depletion  Clavicle Bone Region Severe depletion  Clavicle and Acromion Bone Region Severe depletion  Scapular Bone Region Severe depletion  Dorsal Hand Moderate depletion  Patellar  Region Severe depletion  Anterior Thigh Region Severe depletion  Posterior Calf Region Severe depletion       Diet Order:   Diet Order            Diet regular Room service appropriate? Yes; Fluid consistency: Thin  Diet effective now           Diet Carb Modified                 EDUCATION NEEDS:   No education needs have been identified at this time  Skin:  Skin Assessment: Reviewed RN Assessment  Last BM:  7/3  Height:   Ht Readings from Last 1 Encounters:  10/18/19 5\' 7"  (1.702 m)    Weight:   Wt Readings from Last 1 Encounters:  10/31/19 54.2 kg    BMI:  Body mass index is 18.71 kg/m.  Estimated Nutritional Needs:   Kcal:  1700-1900  Protein:  85-100 grams  Fluid:  >1.7L/d   Kerman Passey MS, RDN, LDN, CNSC Registered Dietitian III Clinical Nutrition RD Pager and On-Call Pager Number Located in [place]

## 2019-11-04 NOTE — Progress Notes (Addendum)
PROGRESS NOTE    Andrea [name]  ZOX:[id number]  DOB: 1961/02/08  PCP: [name], MD Admit date:10/19/2019 Chief compliant: Recurrent hypoglycemia with AMS 59 year old female with history of insulin-dependent diabetes mellitus, hyperlipidemia, hypothyroidism who was brought to the emergency department for evaluation of hypoglycemia. She was evaluated in the ED on 6/19 for blood glucose of 24, hypoglycemia was corrected and she was discharged home .  On 6/20, family called EMS as Andrea [name]'s blood glucose was again low at 30 and was noted to have dysarthria with left-sided weakness. When EMS arrived, Andrea [name] had 2 focal seizures and was given IV Versed along with glucose. On presentation she still had mild left-sided weakness and dysarthria. She takes 70/30 insulin at home, her last dose before presentation was 80 units on the morning of 6/19. She did report recent increase in insulin dosage. ED Course: Afebrile,  Sodium 143, potassium 3.2, CO2 24, glucose 155, alkaline phosphatase 59, albumin 2.6, AST ALT normal, white blood cell 8.9, hemoglobin 10.2, platelet 329.  INR 1.1, CT head: No Acute abnormality.  MRI without contrast: Mild restricted diffusion in the medial right frontal lobe, right insula, and likely right hippocampus most consistent with recent seizure activity. Received additional amp of D50 due to BG 67 at the time of Hospitalist evaluation. Hospital course: Andrea [name] admitted to [facility] for further management with IV dextrose.  Started on Keppra and EEG ordered.Neurology was consulted.Hospital course remarkable for severe constipation.she developed twitching of face muscles on 09/2319. She underwent LP by neurology suspecting for autoimmune/inflammatory disorder. Started on high-dose steroids. PT recommending SNF on DC   Subjective:  No new complaints, seen by diabetes educator.  Worked with PT/OT.  Blood glucose going up today and up to 322 this afternoon.  Objective: Vitals:     11/04/19 0408 11/04/19 0707 11/04/19 0739 11/04/19 1226  BP: 102/65  102/68 101/60  Pulse: (!) 109 (!) 109 (!) 106 (!) 109  Resp: 15  20 17   Temp: 98.8 F (37.1 C)  98.8 F (37.1 C) 98.4 F (36.9 C)  TempSrc: Oral  Oral Oral  SpO2: 100%  99% 100%  Weight:        Intake/Output Summary (Last 24 hours) at 11/04/2019 1441 Last data filed at 11/04/2019 1228 Gross per 24 hour  Intake 120 ml  Output --  Net 120 ml   Filed Weights   10/31/19 0309  Weight: 54.2 kg    Physical Examination: General: Moderately built, no acute distress noted Head ENT: Atraumatic normocephalic, PERRLA, neck supple Heart: S1-S2 heard, regular rate and rhythm, no murmurs.  No leg edema noted Lungs: Equal air entry bilaterally, no rhonchi or rales on exam, no accessory muscle use Abdomen: Bowel sounds heard, soft, nontender, nondistended. No organomegaly.  No CVA tenderness Extremities: No pedal edema.  No cyanosis or clubbing. Neurological: Awake alert oriented x3, no focal weakness or numbness, strength and sensations to crude touch intact Skin: No wounds or rashes.   Data Reviewed: I have personally reviewed following labs and imaging studies  CBC: Recent Labs  Lab 10/29/19 0737 10/30/19 0327 11/02/19 0116 11/03/19 0734 11/04/19 0454  WBC 8.6 8.7 5.5 5.9 6.8  HGB 9.7* 10.2* 10.1* 9.6* 9.3*  HCT 30.0* 31.5* 30.2* 29.5* 28.6*  MCV 88.5 89.5 88.6 90.2 90.2  PLT 472* 466* 343 279 811   Basic Metabolic Panel: Recent Labs  Lab 10/30/19 0327 10/31/19 0535 11/02/19 0116 11/03/19 0734 11/04/19 0454  NA 139 141 140 138 139  K 4.3 3.4*  3.3* 4.0 3.8  CL 103 105 106 104 103  CO2 28 27 27 26 27   GLUCOSE 117* 134* 110* 226* 203*  BUN 5* 7 7 12 19   CREATININE 0.60 0.53 0.53 0.66 0.62  CALCIUM 8.5* 8.5* 8.8* 8.6* 8.7*   GFR: Estimated Creatinine Clearance: 64.8 mL/min (by C-G formula based on SCr of 0.62 mg/dL). Liver Function Tests: No results for input(s): AST, ALT, ALKPHOS, BILITOT, PROT,  ALBUMIN in the last 168 hours. No results for input(s): LIPASE, AMYLASE in the last 168 hours. No results for input(s): AMMONIA in the last 168 hours. Coagulation Profile: No results for input(s): INR, PROTIME in the last 168 hours. Cardiac Enzymes: No results for input(s): CKTOTAL, CKMB, CKMBINDEX, TROPONINI in the last 168 hours. BNP (last 3 results) No results for input(s): PROBNP in the last 8760 hours. HbA1C: No results for input(s): HGBA1C in the last 72 hours. CBG: Recent Labs  Lab 11/03/19 2013 11/04/19 0015 11/04/19 0406 11/04/19 0742 11/04/19 1231  GLUCAP 237* 227* 210* 137* 322*   Lipid Profile: No results for input(s): CHOL, HDL, LDLCALC, TRIG, CHOLHDL, LDLDIRECT in the last 72 hours. Thyroid Function Tests: No results for input(s): TSH, T4TOTAL, FREET4, T3FREE, THYROIDAB in the last 72 hours. Anemia Panel: No results for input(s): VITAMINB12, FOLATE, FERRITIN, TIBC, IRON, RETICCTPCT in the last 72 hours. Sepsis Labs: No results for input(s): PROCALCITON, LATICACIDVEN in the last 168 hours.  Recent Results (from the past 240 hour(s))  MRSA PCR Screening     Status: None   Collection Time: 11/02/19  5:30 AM   Specimen: Nasopharyngeal  Result Value Ref Range Status   MRSA by PCR NEGATIVE NEGATIVE Final    Comment:        The GeneXpert MRSA Assay (FDA approved for NASAL specimens only), is one component of a comprehensive MRSA colonization surveillance program. It is not intended to diagnose MRSA infection nor to guide or monitor treatment for MRSA infections. Performed at [facility], [place] [address], [place], [place] [address]       Radiology Studies: No results found.    Scheduled Meds: . aspirin EC  81 mg Oral Daily  . bisacodyl  10 mg Rectal Once  . cholecalciferol  5,000 Units Oral Daily  . cloBAZam  5 mg Oral BID  . enoxaparin (LOVENOX) injection  40 mg Subcutaneous Q24H  . feeding supplement (ENSURE ENLIVE)  237 mL Oral TID BM  .  feeding supplement (PRO-STAT SUGAR FREE 64)  30 mL Oral BID  . fesoterodine  4 mg Oral Daily  . insulin aspart  0-6 Units Subcutaneous Q4H  . insulin detemir  5 Units Subcutaneous BID  . levETIRAcetam  1,500 mg Oral BID  . levothyroxine  150 mcg Oral Q0600  . liothyronine  5 mcg Oral Daily  . multivitamin with minerals  1 tablet Oral Daily  . psyllium  1 packet Oral BID  . rosuvastatin  20 mg Oral QHS  . senna-docusate  1 tablet Oral BID  . sodium chloride flush  3 mL Intravenous Once   Continuous Infusions:   Assessment/Plan:  1. New onset seizures: Likely related to severe hypoglycemia.  EEG however showed epileptiform discharges.  Now on Keppra/clobazam per neurology recommendations. CT head and MRI findings as above.  Andrea [name] again had facial muscle twitching on 6/23.  She underwent LP,CSF IgG cell count, HSV, myelin basic protein, VDRL normal.  CSF culture negative.  Neurology started her on IV Solu-Medrol for possible autoimmune, inflammatory  disorder or paraneoplastic disorder.  They recommended IV Solu-Medrol 1 g daily for 5 doses-completed on 6/27.  Remains on Keppra/clobazam while autoimmune and paraneoplastic labs sent to [facility].Plan for neurology follow-up with Dr. Greggory Brandy in 6 weeks, possibly repeat MRI at that time.  2.  Type I diabetes mellitus, recurrent hypoglycemia: Andrea [name] reportedly was taking 70/30 insulin at 80 units every morning and 85 units every afternoon.  Insulin held on admission and Andrea [name] treated with IV dextrose.  Blood glucose improved overall with no further hypoglycemia since July 2 (Andrea [name] was having intermittent hypoglycemia prior to that with blood glucose dropping to 80s and 90s at times).  On SSI.  Blood glucose however escalating now.  Seen by diabetes educator and resumed on Levemir 5 units twice daily, monitor closely.  Titrate as needed.   Hemoglobin A1c at 12.4.  C-peptide and proinsulin level both low.  Random insulin level normal.  Seen by  dietitian and placed on Ensure, Protostat nutritional supplements.  Follows endocrinologist Dr. Honor Junes  3.  Early satiety, abnormal CT scan:CT abdomen pelvis showed fecal impaction, bilateral hydroureteronephrosis due to distended rectum, approximately 6.3 cm sigmoid colonic wall thickening may represent focal colitis including stercoral colitis versus colonic neoplasm, large volume of stool throughout the remainder of colon.Extensive GI work-up outpatient with [facility] (Dr [name]), had upper endoscopy on 04/09/2019 for the above complaints, which showed chronic gastritis with H. pylori.No metaplasia or dysplasia or carcinoma.CEA, CA 19-9, normal.  She had a normal GE study on 04/17/2019 which showed no gastroparesis  No recent colonoscopy although Andrea [name] had flex sig 2016 which had shown diverticulosis with nonthrombosed external hemorrhoids, polyps, otherwise benign.  Andrea [name] seen by GI, and underwent inpatient colonoscopy on 7/4 revealing scattered diverticulosis, rectosigmoid ulcer likely stercoral.  Biopsies taken.  Internal hemorrhoids.  GI recommended high-fiber diet and to follow-up with them as outpatient.  4.  Hypothyroidism:Follows with endocrinology, Dr. Dina Rich, continue Synthroid, Cytomel  5. Left upper lobe nodule, 3 mm incidental finding-Repeat CT chest in 3 months, past smoker  6.Severe constipation-CT abdomen/pelvis findings noted, placed on Senokot-S, MiraLAX daily  7.Generalized debility, deconditioning-PT evaluation recommended today recommended home health, 24-hour supervision, 3 n1, DME rolling walker  8.Severe protein calorie malnutrition:Estimated body mass index is 18.71 kg/m .  Dietitian consulted  9.  Deconditioning, goals of care: Seen by physical therapy who recommended HH PT with 24-hour supervision and DME (rolling walker, 3 in 1) versus SNF.  Per granddaughter, [name] Andrea [name] will not have 24/7 supervision at home hence requesting SNF for rehab.   Social work consult has been placed.  Palliative care consult requested after discussing with niece.  DVT prophylaxis: Lovenox Code Status: Full code Family / Andrea [name] Communication: Granddaughter involved in her care.  Discussed with niece at bedside yesterday. Disposition Plan:   Status is: Inpatient  Remains inpatient appropriate because:Unsafe d/c plan   Dispo: The Andrea [name] is from: Home              Anticipated d/c is to: SNF              Anticipated d/c date is: 1 [name]              Andrea [name] currently is medically stable to d/c.            Time spent: 15 minutes     >50% time spent in discussions with care team and coordination of care.    Guilford Shi, MD [facility] Pager in [place]  If 7PM-7AM, please contact night-coverage [email or web address] 11/04/2019, 2:41 PM

## 2019-11-04 NOTE — Progress Notes (Signed)
Occupational Therapy Treatment Patient Details Name: Andrea [name] MRN: [id number] DOB: [birth year] Today's Date: 11/04/2019    History of present illness 59 y.o. female with past medical history significant for diabetes insulin-dependent, hyperlipidemia, hypothyroidism, who was just evaluated at [facility], ER on 6/19 for hypoglycemia of 24, patient was initially nonverbal with generalized weakness at that time.  Hypoglycemia was corrected and she was discharged home.  Today family called EMS because patient's blood sugar was again low at 30.  Patient was also noted to have  left-sided weakness.  Patient was last seen normal at 2 AM.  When EMS was on the scene patient had 2 focal seizures.  Patient received IV Versed and glucose.  On arrival to the ED patient had still mild left-sided weakness and dysarthria. Pt underwent colonscopy on 7/4.   OT comments  Pt making steady progress towards OT goals this session. Pt continues to present with decreased activity tolerance and generalized weakness impacting pts ability to complete BADLs. Pt completed UB/LB ADLs as indicated below with no AD and min guard for safety mostly for line/ equipment mgmt. Pt HR did increase to max 130 bpm while standing at sink to complete oral care. HR decrease to 108 bpm after seated rest break with pt asymptomatic. Pt agreeable to DC plan below, will follow acutely per POC.    Follow Up Recommendations  SNF;Supervision/Assistance - 24 hour    Equipment Recommendations  3 in 1 bedside commode    Recommendations for Other Services      Precautions / Restrictions Precautions Precautions: Fall Precaution Comments: seizure; watch HR Restrictions Weight Bearing Restrictions: No       Mobility Bed Mobility Overal bed mobility: Modified Independent Bed Mobility: Supine to Sit;Sit to Supine     Supine to sit: Modified independent (Device/Increase time) Sit to supine: Modified independent (Device/Increase time)    General bed mobility comments: no physical assist needed, use of rail and elevated HOB  Transfers Overall transfer level: Needs assistance Equipment used: None Transfers: Sit to/from [facility] Sit to Stand: Min guard Stand pivot transfers: Min guard       General transfer comment: min guard from EOB, recliner and BSC. MIN  gaurd to transfer from EOB>recliner>BSC mostly for line/ equipment mgmt    Balance Overall balance assessment: Needs assistance Sitting-balance support: No upper extremity supported;Feet supported Sitting balance-Leahy Scale: Good     Standing balance support: No upper extremity supported;During functional activity Standing balance-Leahy Scale: Good Standing balance comment: anterior pericare                           ADL either performed or assessed with clinical judgement   ADL Overall ADL's : Needs assistance/impaired     Grooming: Oral care;Standing;Supervision/safety Grooming Details (indicate cue type and reason): gross supervision while standing at sink for oral care. pt HR increase to max 130 bpm instructed pt to sit although pt asymptomatic Upper Body Bathing: Supervision/ safety;Set up;Sitting Upper Body Bathing Details (indicate cue type and reason): UB bathing from recliner Lower Body Bathing: Supervison/ safety;Sitting/lateral leans Lower Body Bathing Details (indicate cue type and reason): LB bathing from recliner Upper Body Dressing : Minimal assistance;Sitting Upper Body Dressing Details (indicate cue type and reason): to don new hospital gown Lower Body Dressing: Supervision/safety;Set up;Sitting/lateral leans;Sit to/from stand Lower Body Dressing Details (indicate cue type and reason): sit<>stand to don brief with supervision; pt able to don socks via figure four from  EOB with supervision Toilet Transfer: Min Designer, jewellery Details (indicate cue type and reason): min guarf for  safety Toileting- Clothing Manipulation and Hygiene: Sit to/from stand;Supervision/safety;Set up [place] Manipulation Details (indicate cue type and reason): able to complete anterior pericare via sit<>stand with wash cloth with supervision for balance     Functional mobility during ADLs: Min guard (x2 stand pivot) General ADL Comments: pt able to complete full ADL from recliner, continues to be flat     Vision       Perception     Praxis      Cognition Arousal/Alertness: Awake/alert Behavior During Therapy: Flat affect Overall Cognitive Status: No family/caregiver present to determine baseline cognitive functioning                                 General Comments: slightly slow to process but overall WFL for ADL tasks        Exercises     Shoulder Instructions       General Comments pt on RA with O2 WFL however pt HR increase to 130 bpm while standing at sink to complete oral care. HR decrease to 108 bpm after ~ 1 min seated rest break. pt asymptomatic    Pertinent Vitals/ Pain       Pain Assessment: No/denies pain  Home Living                                          Prior Functioning/Environment              Frequency  Min 2X/week        Progress Toward Goals  OT Goals(current goals can now be found in the care plan section)  Progress towards OT goals: Progressing toward goals  Acute Rehab OT Goals Patient Stated Goal: None stated this session OT Goal Formulation: With patient Time For Goal Achievement: 11/05/19 Potential to Achieve Goals: Good  Plan Discharge plan remains appropriate;Frequency remains appropriate    Co-evaluation                 AM-PAC OT "6 Clicks" Daily Activity     Outcome Measure   Help from another person eating meals?: None Help from another person taking care of personal grooming?: None Help from another person toileting, which includes using toliet, bedpan, or  urinal?: A Little Help from another person bathing (including washing, rinsing, drying)?: A Little Help from another person to put on and taking off regular upper body clothing?: A Little Help from another person to put on and taking off regular lower body clothing?: None 6 Click Score: 21    End of Session Equipment Utilized During Treatment: Other (comment) (BSC)  OT Visit Diagnosis: Unsteadiness on feet (R26.81);Other abnormalities of gait and mobility (R26.89);Muscle weakness (generalized) (M62.81);Repeated falls (R29.6);History of falling (Z91.81);Feeding difficulties (R63.3);Other symptoms and signs involving cognitive function   Activity Tolerance Patient tolerated treatment well   Patient Left in bed;with call bell/phone within reach;with bed alarm set   Nurse Communication Mobility status;Other (comment) (tachy with mobility)        Time: 2831-5176 OT Time Calculation (min): 24 min  Charges: OT General Charges $OT Visit: 1 Visit OT Treatments $Self Care/Home Management : 23-37 mins  Lanier Clam., COTA/L Acute Rehabilitation Services [phone]    JJKK  K Francois Elk 11/04/2019, 1:55 PM

## 2019-11-04 NOTE — Progress Notes (Addendum)
Inpatient Diabetes Program Recommendations  AACE/ADA: New Consensus Statement on Inpatient Glycemic Control (2015)  Target Ranges:  Prepandial:   less than 140 mg/dL      Peak postprandial:   less than 180 mg/dL (1-2 hours)      Critically ill patients:  140 - 180 mg/dL   Lab Results  Component Value Date   GLUCAP 137 (H) 11/04/2019   HGBA1C 12.4 (H) 10/21/2019    Review of Glycemic Control Results for LINDAANN, GRADILLA (MRN [id number]) as of 11/04/2019 11:07  Ref. Range 11/03/2019 12:16 11/03/2019 15:58 11/03/2019 20:13 11/04/2019 00:15 11/04/2019 04:06 11/04/2019 07:42  Glucose-Capillary Latest Ref Range: 70 - 99 mg/dL 213 (H) 217 (H) 237 (H) 227 (H) 210 (H) 137 (H)  Diabetes history:DM Type 1 Outpatient Diabetes medications: Novolin 70/30 80 units in the AM and 85 units in the PM Current orders for Inpatient glycemic control: Novolog 0-6 units q 4 hours Inpatient Diabetes Program Recommendations:    Blood sugars increasing.  Consider adding Levemir 5 units bid.    Thanks,  Adah Perl, RN, BC-ADM Inpatient Diabetes Coordinator Pager [phone] (8a-5p)  Addendum:  Spoke with patient at bedside.  She states that the [facility] sensor fell off.  She still has the reader and an extra sensor for home.  Encouraged her to discuss with Dr. Honor Junes the possibility for refill on sensors.  Also explained that basal insulin is being restarted which should help with blood sugars.

## 2019-11-05 DIAGNOSIS — Z7189 Other specified counseling: Secondary | ICD-10-CM

## 2019-11-05 DIAGNOSIS — Z515 Encounter for palliative care: Secondary | ICD-10-CM

## 2019-11-05 LAB — GLUCOSE, CAPILLARY
Glucose-Capillary: 158 mg/dL — ABNORMAL HIGH (ref 70–99)
Glucose-Capillary: 273 mg/dL — ABNORMAL HIGH (ref 70–99)
Glucose-Capillary: 291 mg/dL — ABNORMAL HIGH (ref 70–99)
Glucose-Capillary: 227 mg/dL — ABNORMAL HIGH (ref 70–99)
Glucose-Capillary: 215 mg/dL — ABNORMAL HIGH (ref 70–99)

## 2019-11-05 LAB — SURGICAL PATHOLOGY

## 2019-11-05 MED ORDER — INSULIN DETEMIR 100 UNIT/ML ~~LOC~~ SOLN
8.0000 [IU] | Freq: Two times a day (BID) | SUBCUTANEOUS | Status: DC
  Administered 2019-11-05: 8 [IU] via SUBCUTANEOUS
  Filled 2019-11-05 (×3): qty 0.08

## 2019-11-05 MED ORDER — SODIUM CHLORIDE 0.9 % IV BOLUS
1000.0000 mL | Freq: Once | INTRAVENOUS | Status: AC
  Administered 2019-11-05: 1000 mL via INTRAVENOUS

## 2019-11-05 MED ORDER — INSULIN ASPART 100 UNIT/ML ~~LOC~~ SOLN
0.0000 [IU] | Freq: Three times a day (TID) | SUBCUTANEOUS | Status: DC
  Administered 2019-11-05: 2 [IU] via SUBCUTANEOUS
  Administered 2019-11-06 (×2): 1 [IU] via SUBCUTANEOUS
  Administered 2019-11-06: 3 [IU] via SUBCUTANEOUS
  Administered 2019-11-07: 1 [IU] via SUBCUTANEOUS
  Administered 2019-11-07: 4 [IU] via SUBCUTANEOUS
  Administered 2019-11-07: 2 [IU] via SUBCUTANEOUS
  Administered 2019-11-08 – 2019-11-09 (×2): 1 [IU] via SUBCUTANEOUS
  Administered 2019-11-09: 6 [IU] via SUBCUTANEOUS
  Administered 2019-11-09: 3 [IU] via SUBCUTANEOUS
  Administered 2019-11-10: 1 [IU] via SUBCUTANEOUS
  Administered 2019-11-10: 2 [IU] via SUBCUTANEOUS
  Administered 2019-11-11 (×2): 1 [IU] via SUBCUTANEOUS
  Administered 2019-11-11: 3 [IU] via SUBCUTANEOUS
  Administered 2019-11-12: 2 [IU] via SUBCUTANEOUS

## 2019-11-05 NOTE — Progress Notes (Signed)
°   11/05/19 1411  Clinical Encounter Type  Visited With Patient  Visit Type Initial;Other (Comment) (HCPOA education)  Referral From Palliative care team  Consult/Referral To Chaplain  Stress Factors  Patient Stress Factors Exhausted  This chaplain responded to PMT consult for spiritual care.  The chaplain completed HCOPA education with the Pt.  The chaplain understands the Pt. will name her granddaughter-Hannah Vickrey [phone]) as HCPOA.  The Pt. requested time to talk to [name] about the role of HCPOA.  The Pt. stated [name] will visit the hospital this week.  The Pt. will ask the RN to contact [place] at the time of Hannah's arrival.  The incomplete Advance Directive is on the window sill.  F/U spiritual care is available as needed.

## 2019-11-05 NOTE — TOC Progression Note (Signed)
Transition of Care [facility]) - Progression Note    Patient Details  Name: Andrea [name] MRN: [id number] Date of Birth: [birth year]  Transition [facility] Care [facility] CM/SW Contact  Andrea Friar, RN Phone Number: 11/05/2019, 12:28 PM  Clinical Narrative:    CM met with the patient and her niece at the bedside. She is agreeable for Andrea [name] for SNF rehab. CM has asked Andrea [name] with Andrea [name] to start insurance authorization.  Pt and niece aware of 14 day isolation after admission and that she would have to stay 30 days under her medicaid.  Pt will need new covid test prior to d/c to [facility]. TOC following.   Expected Discharge Plan: Skilled Nursing Facility Barriers to Discharge: SNF Pending bed offer  Expected Discharge Plan and Services Expected Discharge Plan: [place] Choice: [place] arrangements for the past 2 months: Single Family Home Expected Discharge Date: 10/27/19                                     Social Determinants of Health (SDOH) Interventions    Readmission Risk Interventions No flowsheet data found.

## 2019-11-05 NOTE — Progress Notes (Signed)
Uo to [facility] with assistance. Bath and peri-care completed. Lotion appled to all bony area and back and neck massage given. Up in chair alarm on. Brushed teeth. Linen changed. Call light within reach.

## 2019-11-05 NOTE — Consult Note (Signed)
Consultation Note Date: 11/05/2019   Patient Name: Andrea [name]  DOB: 08/11/1960  MRN: [id number]  Age / Sex: 59 y.o., female  PCP: Andrea Bonito, MD Referring Physician: Damita Lack, MD  Reason for Consultation: Establishing goals of care  HPI/Patient Profile: 59 y.o. female  with past medical history of T1DM, HLD, and hypothyroidism admitted on 10/19/2019 with hypoglycemia and seizures. EEG revealed epileptiform discharges and patient started on AEDs. Patient found to have severe constipation.  Incidental finding of LUL lung nodule. Patient has generalized debility and malnutrition. Patient does not have 24/7 care at home; therefore, SNF is being pursued. PMT consulted for [place].  Clinical Assessment and Goals of Care: I have reviewed medical records including EPIC notes, labs and imaging, and assessed the patient and then met with patient  to discuss diagnosis prognosis, GOC, EOL wishes, disposition and options.  I introduced Palliative Medicine as specialized medical care for people living with serious illness. It focuses on providing relief from the symptoms and stress of a serious illness. The goal is to improve quality of life for both the patient and the family.  Patient does not have any specific complaints - just tells me she is very tired. She is sleepy during our conversation but oriented and able to participate appropriately.   As far as functional and nutritional status, patient tells me she uses a walker at home. Tells me she has a good appetite at home. She denies weakness - just feels very tired.   We discussed patient's current illness and what it means in the larger context of patient's on-going co-morbidities. I attempted to elicit values and goals of care important to the patient.  Patient expresses a desire to "get better" and she is interested in all medical interventions offered to prolong life. Requests full code  status.  [name] would want [name] to make decisions for her if she were unable.   Discussed with patient the importance of continued conversation with family and the medical providers regarding overall plan of care and treatment options, ensuring decisions are within the context of the patient's values and GOCs.     Palliative Care services outpatient were explained and offered. She is agreeable to outpatient palliative services.  Questions and concerns were addressed. The family was encouraged to call with questions or concerns.    Primary Decision Maker PATIENT  Patient names granddaughter Andrea [name] if she unable to make decisions for herself - will request chaplain services  SUMMARY OF RECOMMENDATIONS   - full code/full scope - palliative to follow at SNF  Code Status/Advance Care Planning:  Full code  Discharge Planning: [place] for rehab with Palliative care service follow-up      Primary Diagnoses: Present on Admission: . Hypoglycemia   I have reviewed the medical record, interviewed the patient and family, and examined the patient. The following aspects are pertinent.  Past Medical History:  Diagnosis Date  . Diabetes mellitus without complication ([place])   . Hyperlipidemia   . Hypothyroidism   . Thyroid disease    Social History   Socioeconomic History  . Marital status: Single    Spouse name: Not on file  . Number of children: Not on file  . Years of education: Not on file  . Highest education level: Not on file  Occupational History  . Not on file  Tobacco Use  . Smoking status: Never Smoker  . Smokeless tobacco: Never Used  Vaping Use  . Vaping  Use: Never used  Substance and Sexual Activity  . Alcohol use: No  . Drug use: No  . Sexual activity: Not on file  Other Topics Concern  . Not on file  Social History Narrative  . Not on file   Social Determinants of Health   Financial Resource Strain:   . Difficulty  of Paying Living Expenses:   Food Insecurity:   . Worried About Charity fundraiser in the Last Year:   . Arboriculturist in the Last Year:   Transportation Needs:   . Film/video editor (Medical):   Marland Kitchen [name] of Transportation (Non-Medical):   Physical Activity:   . Days of Exercise per Week:   . Minutes of Exercise per Session:   Stress:   . Feeling of Stress :   Social Connections:   . Frequency of Communication with Friends and Family:   . Frequency of Social Gatherings with Friends and Family:   . Attends Religious Services:   . Active Member of Clubs or Organizations:   . Attends Archivist Meetings:   Marland Kitchen Marital Status:    Family History  Problem Relation Age of Onset  . Heart disease Father   . Breast cancer Neg Hx    Scheduled Meds: . aspirin EC  81 mg Oral Daily  . bisacodyl  10 mg Rectal Once  . cholecalciferol  5,000 Units Oral Daily  . cloBAZam  5 mg Oral BID  . enoxaparin (LOVENOX) injection  40 mg Subcutaneous Q24H  . feeding supplement (ENSURE ENLIVE)  237 mL Oral TID BM  . feeding supplement (PRO-STAT SUGAR FREE 64)  30 mL Oral BID  . fesoterodine  4 mg Oral Daily  . insulin aspart  0-6 Units Subcutaneous Q4H  . insulin detemir  5 Units Subcutaneous BID  . levETIRAcetam  1,500 mg Oral BID  . levothyroxine  150 mcg Oral Q0600  . liothyronine  5 mcg Oral Daily  . multivitamin with minerals  1 tablet Oral Daily  . psyllium  1 packet Oral BID  . rosuvastatin  20 mg Oral QHS  . senna-docusate  1 tablet Oral BID  . sodium chloride flush  3 mL Intravenous Once   Continuous Infusions: PRN Meds:.acetaminophen **OR** acetaminophen, hydrALAZINE, LORazepam, ondansetron (ZOFRAN) IV Allergies  Allergen Reactions  . Latex Hives   Review of Systems  Constitutional: Positive for activity change and fatigue.    Physical Exam Constitutional:      General: She is not in acute distress.    Comments: lethargic  Pulmonary:     Effort: Pulmonary effort  is normal. No respiratory distress.  Skin:    General: Skin is warm and dry.  Neurological:     Mental Status: She is oriented to person, place, and time.     Vital Signs: BP 109/63 (BP Location: Right Arm)   Pulse (!) 103   Temp 98.7 F (37.1 C) (Oral)   Resp 18   Wt 54.2 kg   SpO2 100%   BMI 18.71 kg/m  Pain Scale: 0-10   Pain Score: 0-No pain   SpO2: SpO2: 100 % O2 Device:SpO2: 100 % O2 Flow Rate: .   IO: Intake/output summary:   Intake/Output Summary (Last 24 hours) at 11/05/2019 1206 Last data filed at 11/05/2019 0854 Gross per 24 hour  Intake 480 ml  Output --  Net 480 ml    LBM: Last BM Date: 11/01/19 Baseline Weight: Weight: 54.2 kg Most recent weight: Weight:  54.2 kg     Palliative Assessment/Data: PPS 50%    Time Total: 50 minutes Greater than 50%  of this time was spent counseling and coordinating care related to the above assessment and plan.  Juel Burrow, DNP, AGNP-C Palliative Medicine Team [phone] Pager: [phone]

## 2019-11-05 NOTE — Progress Notes (Signed)
PROGRESS NOTE    Andrea [name]  NLG:[id number] DOB: Oct 29, 1960 DOA: 10/19/2019 PCP: [name], MD   Brief Narrative:  59 year old female with history of insulin-dependent diabetes mellitus, hyperlipidemia, hypothyroidism who was brought to the emergency department for evaluation of hypoglycemia. She was evaluated in the ED on 6/19 for blood glucose of 24, hypoglycemia was corrected and she was discharged home .  On 6/20, family called EMS as patient's blood glucose was again low at 30 and was noted to have dysarthria with left-sided weakness. When EMS arrived, patient had 2 focal seizures and was given IV Versed along with glucose. On presentation she still had mild left-sided weakness and dysarthria. She takes 70/30 insulin at home, her last dose before presentation was 80 units on the morning of 6/19. She did report recent increase in insulin dosage.  CT head:No Acute abnormality.MRI without contrast: Mild restricted diffusion in the medial right frontal lobe, rightinsula, and likely right hippocampus most consistent with recent seizure activity. Received additional amp of D50 due to BG 67 at the time of Hospitalist evaluation.Started on Keppra and EEG ordered.Neurology was consulted.Hospital course remarkable for severe constipation.she developed twitching of face muscles on 09/2319. She underwent LP by neurology suspecting for autoimmune/inflammatory disorder. Started on high-dose steroids. PT recommending SNF on DC   Assessment & Plan:   Active Problems:   Type 1 diabetes mellitus with other specified complication (HCC)   Seizure (HCC)   Hypoglycemia   Hypokalemia   Protein-calorie malnutrition, severe   Goals of care, counseling/discussion   Palliative care by specialist  New onset seizures, likely secondary to hypoglycemia EEG however showed epileptiform discharges.  Now on Keppra/clobazam per neurology recommendations.   Patient again had facial muscle twitching on  6/23.  She underwent LP,CSF IgG cell count, HSV, myelin basic protein, VDRL normal. CSF culture negative.  Neurology started her on IV Solu-Medrol for possible autoimmune, inflammatory disorder or paraneoplastic disorder.  They recommended IV Solu-Medrol 1 g daily for 5 doses-completed on 6/27.  Plan for neurology follow-up with Dr. Greggory Brandy in 6 weeks, possibly repeat MRI at that time.  Sinus tachycardia -Dehydration?.  Will give 1 L normal saline bolus, if this does not resolve "start patient on beta-blocker  Type I diabetes mellitus, recurrent hypoglycemia  Patient reportedly was taking 70/30 insulin at 80 units every morning and 85 units every afternoon. Now patient is on Levemir 8 units twice daily, insulin sliding scale and Accu-Chek. Adjust insulin as necessary Follows endocrinologist Dr. Honor Junes  Severe constipation/ stercoral Colitis  CT abdomen pelvis showed fecal impaction, bilateral hydroureteronephrosis due to distended rectum, approximately 6.3 cm sigmoid colonic wall thickening may represent focal colitis including stercoral colitis versus colonic neoplasm, large volume of stool throughout the remainder of colon.Extensive GI work-up outpatient with [facility] (Dr [name]), had upper endoscopy on 04/09/2019 for the above complaints, which showed chronic gastritis with H. pylori.No metaplasia or dysplasia or carcinoma.CEA, CA 19-9, normal.  She had a normal GE study on 04/17/2019 which showed no gastroparesis Inpatient colonoscopy on 7/4 revealing scattered diverticulosis, rectosigmoid ulcer likely stercoral.  Biopsies taken.  Internal hemorrhoids.  GI recommended high-fiber diet and to follow-up with them as outpatient. Aggressive bowel regimen  Hypothyroidism Follows with endocrinology, Dr. Dina Rich, continue Synthroid, Cytomel  Left upper lobe nodule, 3 mm incidental finding Repeat CT chest in 3 months, past smoker  Generalized debility, deconditioning- SNF  placement  Severe protein calorie malnutrition:Estimated body mass index is 18.71 kg/m .    Seen by  dietitian  Deconditioning, goals of care Seen by palliative care  DVT prophylaxis: Lovenox Code Status: Full code Family Communication: None at bedside  Status is: Inpatient  Dispo:  Patient From: [place]  Planned Disposition: [place]  Expected discharge date: 1-2 days  Medically stable for discharge: Yes, placement plan available     Body mass index is 18.71 kg/m.     Subjective: Patient is ready for complaints, she feels okay.  Review of Systems Otherwise negative except as per HPI, including: General: Denies fever, chills, night sweats or unintended weight loss. Resp: Denies cough, wheezing, shortness of breath. Cardiac: Denies chest pain, palpitations, orthopnea, paroxysmal nocturnal dyspnea. GI: Denies abdominal pain, nausea, vomiting, diarrhea or constipation GU: Denies dysuria, frequency, hesitancy or incontinence MS: Denies muscle aches, joint pain or swelling Neuro: Denies headache, neurologic deficits (focal weakness, numbness, tingling), abnormal gait Psych: Denies anxiety, depression, SI/HI/AVH Skin: Denies new rashes or lesions ID: Denies sick contacts, exotic exposures, travel  Examination:  General exam: Appears calm and comfortable  Respiratory system: Clear to auscultation. Respiratory effort normal. Cardiovascular system: S1 & S2 heard, RRR. No JVD, murmurs, rubs, gallops or clicks. No pedal edema. Gastrointestinal system: Abdomen is nondistended, soft and nontender. No organomegaly or masses felt. Normal bowel sounds heard. Central nervous system: Alert and oriented. No focal neurological deficits. Extremities: Symmetric 5 x 5 power. Skin: No rashes, lesions or ulcers Psychiatry: Judgement and insight appear normal. Mood & affect appropriate.     Objective: Vitals:   11/04/19 1546 11/04/19 1951 11/04/19  2351 11/05/19 0416  BP: 103/68 112/71  109/63  Pulse: (!) 107 (!) 107  (!) 103  Resp: 17 15  18   Temp: 98.6 F (37 C) 98.1 F (36.7 C) 99.1 F (37.3 C) 98.7 F (37.1 C)  TempSrc: Oral  Oral Oral  SpO2: 100% 99%  100%  Weight:        Intake/Output Summary (Last 24 hours) at 11/05/2019 1237 Last data filed at 11/05/2019 0854 Gross per 24 hour  Intake 360 ml  Output --  Net 360 ml   Filed Weights   10/31/19 0309  Weight: 54.2 kg     Data Reviewed:   CBC: Recent Labs  Lab 10/30/19 0327 11/02/19 0116 11/03/19 0734 11/04/19 0454  WBC 8.7 5.5 5.9 6.8  HGB 10.2* 10.1* 9.6* 9.3*  HCT 31.5* 30.2* 29.5* 28.6*  MCV 89.5 88.6 90.2 90.2  PLT 466* 343 279 858   Basic Metabolic Panel: Recent Labs  Lab 10/30/19 0327 10/31/19 0535 11/02/19 0116 11/03/19 0734 11/04/19 0454  NA 139 141 140 138 139  K 4.3 3.4* 3.3* 4.0 3.8  CL 103 105 106 104 103  CO2 28 27 27 26 27   GLUCOSE 117* 134* 110* 226* 203*  BUN 5* 7 7 12 19   CREATININE 0.60 0.53 0.53 0.66 0.62  CALCIUM 8.5* 8.5* 8.8* 8.6* 8.7*   GFR: Estimated Creatinine Clearance: 64.8 mL/min (by C-G formula based on SCr of 0.62 mg/dL). Liver Function Tests: No results for input(s): AST, ALT, ALKPHOS, BILITOT, PROT, ALBUMIN in the last 168 hours. No results for input(s): LIPASE, AMYLASE in the last 168 hours. No results for input(s): AMMONIA in the last 168 hours. Coagulation Profile: No results for input(s): INR, PROTIME in the last 168 hours. Cardiac Enzymes: No results for input(s): CKTOTAL, CKMB, CKMBINDEX, TROPONINI in the last 168 hours. BNP (last 3 results) No results for input(s): PROBNP in the last 8760 hours. HbA1C: No results for input(s):  HGBA1C in the last 72 hours. CBG: Recent Labs  Lab 11/04/19 1548 11/04/19 1953 11/04/19 2317 11/05/19 0306 11/05/19 0850  GLUCAP 173* 186* 215* 158* 273*   Lipid Profile: No results for input(s): CHOL, HDL, LDLCALC, TRIG, CHOLHDL, LDLDIRECT in the last 72  hours. Thyroid Function Tests: No results for input(s): TSH, T4TOTAL, FREET4, T3FREE, THYROIDAB in the last 72 hours. Anemia Panel: No results for input(s): VITAMINB12, FOLATE, FERRITIN, TIBC, IRON, RETICCTPCT in the last 72 hours. Sepsis Labs: No results for input(s): PROCALCITON, LATICACIDVEN in the last 168 hours.  Recent Results (from the past 240 hour(s))  MRSA PCR Screening     Status: None   Collection Time: 11/02/19  5:30 AM   Specimen: Nasopharyngeal  Result Value Ref Range Status   MRSA by PCR NEGATIVE NEGATIVE Final    Comment:        The GeneXpert MRSA Assay (FDA approved for NASAL specimens only), is one component of a comprehensive MRSA colonization surveillance program. It is not intended to diagnose MRSA infection nor to guide or monitor treatment for MRSA infections. Performed at [facility], [place] [address], [place], [place] [address]          Radiology Studies: No results found.      Scheduled Meds: . aspirin EC  81 mg Oral Daily  . bisacodyl  10 mg Rectal Once  . cholecalciferol  5,000 Units Oral Daily  . cloBAZam  5 mg Oral BID  . enoxaparin (LOVENOX) injection  40 mg Subcutaneous Q24H  . feeding supplement (ENSURE ENLIVE)  237 mL Oral TID BM  . feeding supplement (PRO-STAT SUGAR FREE 64)  30 mL Oral BID  . fesoterodine  4 mg Oral Daily  . insulin aspart  0-6 Units Subcutaneous Q4H  . insulin detemir  5 Units Subcutaneous BID  . levETIRAcetam  1,500 mg Oral BID  . levothyroxine  150 mcg Oral Q0600  . liothyronine  5 mcg Oral Daily  . multivitamin with minerals  1 tablet Oral Daily  . psyllium  1 packet Oral BID  . rosuvastatin  20 mg Oral QHS  . senna-docusate  1 tablet Oral BID  . sodium chloride flush  3 mL Intravenous Once   Continuous Infusions: . sodium chloride       LOS: 16 days   Time spent= 35 mins    Sergi Gellner Arsenio Loader, MD [facility]  If 7PM-7AM, please contact night-coverage  11/05/2019, 12:37 PM

## 2019-11-05 NOTE — Progress Notes (Signed)
Inpatient Diabetes Program Recommendations  AACE/ADA: New Consensus Statement on Inpatient Glycemic Control (2015)  Target Ranges:  Prepandial:   less than 140 mg/dL      Peak postprandial:   less than 180 mg/dL (1-2 hours)      Critically ill patients:  140 - 180 mg/dL   Lab Results  Component Value Date   GLUCAP 273 (H) 11/05/2019   HGBA1C 12.4 (H) 10/21/2019    Review of Glycemic Control Results for Andrea [name], Andrea [name] (MRN [id number]) as of 11/05/2019 11:47  Ref. Range 11/04/2019 07:42 11/04/2019 12:31 11/04/2019 15:48 11/04/2019 19:53 11/04/2019 23:17 11/05/2019 03:06 11/05/2019 08:50  Glucose-Capillary Latest Ref Range: 70 - 99 mg/dL 137 (H) 322 (H) 173 (H) 186 (H) 215 (H) 158 (H) 273 (H)  Diabetes history:DM Type 1 Outpatient Diabetes medications: Novolin 70/30 80 units in the AM and 85 units in the PM Current orders for Inpatient glycemic control: Novolog 0-6 units q 4 hours Levemir 5 units bid  Inpatient Diabetes Program Recommendations:    Note blood sugars improved.  May consider changing Novolog correction to sensitive(0-9 units) tid with meals and HS.  Also consider increasing Levemir to 8 units bid.    Thanks,  Adah Perl, RN, BC-ADM Inpatient Diabetes Coordinator Pager [phone] (8a-5p)

## 2019-11-05 NOTE — Progress Notes (Signed)
Physical Therapy Treatment Patient Details Name: Andrea [name] MRN: [id number] DOB: 08-31-1960 Today's Date: 11/05/2019    History of Present Illness 59 y.o. female with past medical history significant for diabetes insulin-dependent, hyperlipidemia, hypothyroidism, who was just evaluated at [facility], ER on 6/19 for hypoglycemia of 24, patient was initially nonverbal with generalized weakness at that time.  Hypoglycemia was corrected and she was discharged home.  Today family called EMS because patient's blood sugar was again low at 30.  Patient was also noted to have  left-sided weakness.  Patient was last seen normal at 2 AM.  When EMS was on the scene patient had 2 focal seizures.  Patient received IV Versed and glucose.  On arrival to the ED patient had still mild left-sided weakness and dysarthria. Pt underwent colonscopy on 7/4.    PT Comments    Continuing work on functional mobility and activity tolerance;  Session focused on continuing progressive amb with close HR monitoring; Pt sustained HR in Low to Mid 130s during walk (highest observed 136); Eased back down to 110s with seated rest;   Noted per TOC note, pt will be discharging to SNF   Follow Up Recommendations  Supervision/Assistance - 24 hour;SNF (Noted she will not have 24 hour assist at home)     Equipment Recommendations  Rolling walker with 5" wheels;3in1 (PT)    Recommendations for Other Services       Precautions / Restrictions Precautions Precautions: Fall Precaution Comments: seizure; watch HR    Mobility  Bed Mobility Overal bed mobility: Modified Independent             General bed mobility comments: no physical assist needed, use of rail and elevated HOB  Transfers Overall transfer level: Needs assistance Equipment used: Rolling walker (2 wheeled) Transfers: Sit to/from [facility] Sit to Stand: Min guard Stand pivot transfers: Min guard       General transfer comment: min  guard from EOB, recliner and BSC. MIN  gaurd to transfer from EOB>recliner>BSC mostly for line/ equipment mgmt  Ambulation/Gait Ambulation/Gait assistance: Supervision Gait Distance (Feet): 200 Feet Assistive device: Rolling walker (2 wheeled) Gait Pattern/deviations: Step-through pattern Gait velocity: reduced   General Gait Details: Slow gait, with cues to self-monitor for activity tolerance; no dizziness/fatigue reported -- HR max 136 bpm   Stairs             Wheelchair Mobility    Modified Rankin (Stroke Patients Only) Modified Rankin (Stroke Patients Only) Pre-Morbid Rankin Score: No symptoms Modified Rankin: Moderately severe disability     Balance Overall balance assessment: Needs assistance Sitting-balance support: No upper extremity supported;Feet supported Sitting balance-Leahy Scale: Good                                      Cognition Arousal/Alertness: Awake/alert Behavior During Therapy: Flat affect Overall Cognitive Status: No family/caregiver present to determine baseline cognitive functioning                                 General Comments: slightly slow to process but overall WFL for ADL tasks      Exercises      General Comments        Pertinent Vitals/Pain Pain Assessment: No/denies pain    Home Living  Prior Function            PT Goals (current goals can now be found in the care plan section) Acute Rehab PT Goals Patient Stated Goal: None stated this session PT Goal Formulation: With patient Time For Goal Achievement: 11/05/19 Potential to Achieve Goals: Good Progress towards PT goals: Progressing toward goals    Frequency    Min 2X/week      PT Plan Frequency needs to be updated;Discharge plan needs to be updated    Co-evaluation              AM-PAC PT "6 Clicks" Mobility   Outcome Measure  Help needed turning from your back to your side while in a  flat bed without using bedrails?: None Help needed moving from lying on your back to sitting on the side of a flat bed without using bedrails?: None Help needed moving to and from a bed to a chair (including a wheelchair)?: None Help needed standing up from a chair using your arms (e.g., wheelchair or bedside chair)?: None Help needed to walk in hospital room?: None Help needed climbing 3-5 steps with a railing? : A Little 6 Click Score: 23    End of Session Equipment Utilized During Treatment: Gait belt Activity Tolerance: Patient tolerated treatment well Patient left: in bed;with call bell/phone within reach;with bed alarm set;with nursing/sitter in room Nurse Communication: Mobility status PT Visit Diagnosis: Unsteadiness on feet (R26.81);Other abnormalities of gait and mobility (R26.89);Muscle weakness (generalized) (M62.81);Other symptoms and signs involving the nervous system (R29.898)     Time: 5681-2751 PT Time Calculation (min) (ACUTE ONLY): 27 min  Charges:  $Gait Training: 23-37 mins                     Roney Marion, [place]  Acute Rehabilitation Services Pager [phone] Office [place] 11/05/2019, 3:35 PM

## 2019-11-06 DIAGNOSIS — R000 Tachycardia, unspecified: Secondary | ICD-10-CM

## 2019-11-06 LAB — BASIC METABOLIC PANEL
Anion gap: 8 (ref 5–15)
BUN: 12 mg/dL (ref 6–20)
CO2: 25 mmol/L (ref 22–32)
Calcium: 8.9 mg/dL (ref 8.9–10.3)
Chloride: 106 mmol/L (ref 98–111)
Creatinine, Ser: 0.62 mg/dL (ref 0.44–1.00)
GFR calc Af Amer: >60 mL/min (ref >60)
GFR calc non Af Amer: >60 mL/min (ref >60)
Glucose, Bld: 194 mg/dL — ABNORMAL HIGH (ref 70–99)
Potassium: 3.3 mmol/L — ABNORMAL LOW (ref 3.5–5.1)
Sodium: 139 mmol/L (ref 135–145)

## 2019-11-06 LAB — GLUCOSE, CAPILLARY
Glucose-Capillary: 154 mg/dL — ABNORMAL HIGH (ref 70–99)
Glucose-Capillary: 182 mg/dL — ABNORMAL HIGH (ref 70–99)
Glucose-Capillary: 187 mg/dL — ABNORMAL HIGH (ref 70–99)
Glucose-Capillary: 196 mg/dL — ABNORMAL HIGH (ref 70–99)
Glucose-Capillary: 205 mg/dL — ABNORMAL HIGH (ref 70–99)
Glucose-Capillary: 264 mg/dL — ABNORMAL HIGH (ref 70–99)
Glucose-Capillary: 269 mg/dL — ABNORMAL HIGH (ref 70–99)

## 2019-11-06 LAB — CBC
HCT: 26.9 % — ABNORMAL LOW (ref 36.0–46.0)
Hemoglobin: 8.8 g/dL — ABNORMAL LOW (ref 12.0–15.0)
MCH: 29.3 pg (ref 26.0–34.0)
MCHC: 32.7 g/dL (ref 30.0–36.0)
MCV: 89.7 fL (ref 80.0–100.0)
Platelets: 197 10*3/uL (ref 150–400)
RBC: 3 MIL/uL — ABNORMAL LOW (ref 3.87–5.11)
RDW: 14.5 % (ref 11.5–15.5)
WBC: 6.6 10*3/uL (ref 4.0–10.5)
nRBC: 0 % (ref 0.0–0.2)

## 2019-11-06 LAB — MAGNESIUM: Magnesium: 1.6 mg/dL — ABNORMAL LOW (ref 1.7–2.4)

## 2019-11-06 LAB — SARS CORONAVIRUS 2 BY RT PCR (HOSPITAL ORDER, PERFORMED IN ~~LOC~~ HOSPITAL LAB): SARS Coronavirus 2: NEGATIVE

## 2019-11-06 MED ORDER — INSULIN ASPART 100 UNIT/ML ~~LOC~~ SOLN: 3.0000 [IU] | Freq: Three times a day (TID) | SUBCUTANEOUS | 11 refills | Status: DC

## 2019-11-06 MED ORDER — INSULIN DETEMIR 100 UNIT/ML ~~LOC~~ SOLN
10.0000 [IU] | Freq: Two times a day (BID) | SUBCUTANEOUS | Status: DC
  Administered 2019-11-06 – 2019-11-12 (×12): 10 [IU] via SUBCUTANEOUS
  Filled 2019-11-06 (×15): qty 0.1

## 2019-11-06 MED ORDER — ADULT MULTIVITAMIN W/MINERALS CH: 1.0000 | ORAL_TABLET | Freq: Every day | ORAL | Status: DC

## 2019-11-06 MED ORDER — INSULIN ASPART 100 UNIT/ML ~~LOC~~ SOLN: 0.0000 [IU] | Freq: Three times a day (TID) | SUBCUTANEOUS | 11 refills | Status: AC

## 2019-11-06 MED ORDER — INSULIN ASPART 100 UNIT/ML ~~LOC~~ SOLN
3.0000 [IU] | Freq: Three times a day (TID) | SUBCUTANEOUS | Status: DC
  Administered 2019-11-06 – 2019-11-12 (×19): 3 [IU] via SUBCUTANEOUS

## 2019-11-06 MED ORDER — PSYLLIUM 95 % PO PACK: 1.0000 | PACK | Freq: Two times a day (BID) | ORAL | Status: DC

## 2019-11-06 MED ORDER — INSULIN DETEMIR 100 UNIT/ML ~~LOC~~ SOLN: 10.0000 [IU] | Freq: Two times a day (BID) | SUBCUTANEOUS | 11 refills | Status: AC

## 2019-11-06 MED ORDER — METOPROLOL TARTRATE 25 MG PO TABS
25.0000 mg | ORAL_TABLET | Freq: Two times a day (BID) | ORAL | Status: DC
  Administered 2019-11-06 – 2019-11-08 (×6): 25 mg via ORAL
  Filled 2019-11-06 (×6): qty 1

## 2019-11-06 MED ORDER — POTASSIUM CHLORIDE CRYS ER 20 MEQ PO TBCR
40.0000 meq | EXTENDED_RELEASE_TABLET | Freq: Once | ORAL | Status: AC
  Administered 2019-11-06: 40 meq via ORAL
  Filled 2019-11-06: qty 2

## 2019-11-06 MED ORDER — METOPROLOL TARTRATE 25 MG PO TABS: 25.0000 mg | ORAL_TABLET | Freq: Two times a day (BID) | ORAL | Status: DC

## 2019-11-06 MED ORDER — MAGNESIUM SULFATE 2 GM/50ML IV SOLN
2.0000 g | Freq: Once | INTRAVENOUS | Status: AC
  Administered 2019-11-06: 2 g via INTRAVENOUS
  Filled 2019-11-06: qty 50

## 2019-11-06 MED ORDER — ENSURE ENLIVE PO LIQD: 237.0000 mL | Freq: Three times a day (TID) | ORAL | 12 refills | Status: DC

## 2019-11-06 NOTE — Discharge Summary (Addendum)
Physician Discharge Summary  Andrea [name] UTM:[id number] DOB: [birth year] DOA: 10/19/2019  PCP: [name], MD  Admit date: 10/19/2019 Discharge date: 11/11/2019  Admitted From: Home Disposition: SNF  Recommendations for Outpatient Follow-up:  1. Follow up with PCP in 1-2 weeks 2. Please obtain BMP/CBC in one week your next doctors visit.  3. Follow-up outpatient endocrinology in 2-3 weeks 4. Outpatient repeat CT chest without contrast for left upper lobe lung nodule in 3 months 5. Need daily aggressive bowel regimen to ensure she has 1-2 soft bowel movements daily 6. Daily Metamucil 7. Follow-up outpatient neurology in 4 to 6 weeks 8. Metoprolol 50 mg twice daily started 9. Home 70/30 insulin discontinued due to risk of hypoglycemia.  No started on Levemir 10 units twice daily, insulin sliding scale and NovoLog 3 units premeals.  Adjust as necessary. 10. Daily Keppra and clobazam for seizures  Discharge Condition: Stable CODE STATUS: Full code Diet recommendation: Diabetic  Brief/Interim Summary: 59 year old female with history of insulin-dependent diabetes mellitus, hyperlipidemia, hypothyroidism who was brought to the emergency department for evaluation of hypoglycemia. She was evaluated in the ED on 6/19 for blood glucose of 24, hypoglycemia was corrected and she was discharged home . On 6/20, family called EMS as patient's blood glucose was again low at 30 and was noted to have dysarthria with left-sided weakness. When EMS arrived, patient had 2 focal seizures and was given IV Versed along with glucose. On presentation she still had mild left-sided weakness and dysarthria. She takes 70/30 insulin at home, her last dose before presentation was 80 units on the morning of 6/19. She did report recent increase in insulin dosage. CT head:No Acute abnormality.MRI without contrast: Mild restricted diffusion in the medial right frontal lobe, rightinsula, and likely right hippocampus  most consistent with recent seizure activity. Received additional amp of D50 due to BG 67 at the time of Hospitalist evaluation.Started on Keppra and EEG ordered.Neurology was consulted.Hospital course remarkable for severe constipation.she developed twitching of face muscles on 09/2319. She underwent LP by neurology suspecting for autoimmune/inflammatory disorder. Started on high-dose steroids and completed course in the hospital. PT recommending SNF on DC.  Arrangements were made by [facility] team.   Assessment & Plan:    New onset seizures, likely secondary to hypoglycemia EEG however showed epileptiform discharges. Now on Keppra/clobazam per neurology recommendations.  Patient again had facial muscle twitching on 6/23. She underwent LP,CSF IgG cell count, HSV, myelin basic protein, VDRL normal. CSF culture negative. Neurology started her on IV Solu-Medrol for possible autoimmune, inflammatory disorder or paraneoplastic disorder. They recommended IV Solu-Medrol 1 g daily for 5 doses-completed on 6/27.  Plan for neurology follow-up with Dr. Greggory Brandy in 6 weeks, possibly repeat MRI at that time.  Sinus tachycardia -Improved with metoprolol 50mg  po bid  Type I diabetes mellitus, recurrent hypoglycemia  Patient reportedly was taking 70/30 insulin at 80 units every morning and 85 units every afternoon.  Now discontinued Currently on Levemir 10 units twice daily, NovoLog 3 units premeals 3 times daily and sliding scale. Adjust insulin as necessary Follows endocrinologist Dr. Honor Junes  Severe constipation/ stercoral Colitis  CT abdomen pelvis showed fecal impaction, bilateral hydroureteronephrosis due to distended rectum, approximately 6.3 cm sigmoid colonic wall thickening may represent focal colitis including stercoral colitis versus colonic neoplasm, large volume of stool throughout the remainder of colon.Extensive GI work-up outpatient with [facility] (Dr [name]), had upper endoscopy  on 04/09/2019 for the above complaints, which showed chronic gastritis with H. pylori.No metaplasia  or dysplasia or carcinoma.CEA, CA 19-9, normal. She had a normal GE study on 04/17/2019 which showed no gastroparesis Inpatient colonoscopy on 7/4 revealing scattered diverticulosis, rectosigmoid ulcer likely stercoral. Biopsies taken. Internal hemorrhoids. GI recommended high-fiber diet and to follow-up with them as outpatient. Aggressive bowel regimen-she needs to have 1-2 soft bowel movements daily  Hypothyroidism Follows with endocrinology, Dr. Dina Rich, continue Synthroid, Cytomel  Left upper lobe nodule, 3 mm incidental finding Repeat CT chest in 3 months, past smoker  Generalized debility, deconditioning- SNF placement  Severe protein calorie malnutrition:Estimated body mass index is 18.71 kg/m .   Seen by dietitian  Deconditioning, goals of care Seen by palliative care  Body mass index is 18.71 kg/m.    Discharge Diagnoses:  Active Problems:   Type 1 diabetes mellitus with other specified complication (HCC)   Seizure (HCC)   Hypoglycemia   Hypokalemia   Protein-calorie malnutrition, severe   Goals of care, counseling/discussion   Palliative care by specialist      Consultations:  Neurology  GI  Subjective: Feels okay no complaints  Discharge Exam: Vitals:   11/11/19 0018 11/11/19 0731  BP: 117/67 (!) 111/94  Pulse: (!) 106 90  Resp: 18 16  Temp: 97.8 F (36.6 C) 97.7 F (36.5 C)  SpO2: 100% 100%   Vitals:   11/10/19 1518 11/10/19 2036 11/11/19 0018 11/11/19 0731  BP: (!) 91/47 (!) 95/52 117/67 (!) 111/94  Pulse: 94 (!) 102 (!) 106 90  Resp: 18 18 18 16   Temp: 98.1 F (36.7 C) 98.6 F (37 C) 97.8 F (36.6 C) 97.7 F (36.5 C)  TempSrc: Oral Oral Oral Oral  SpO2: 99% 98% 100% 100%  Weight:        General: Pt is alert, awake, not in acute distress Cardiovascular: Sinus tachycardia, RRR, S1/S2 +, no rubs, no gallops Respiratory: CTA  bilaterally, no wheezing, no rhonchi Abdominal: Soft, NT, ND, bowel sounds + Extremities: no edema, no cyanosis  Discharge Instructions  Discharge Instructions    Ambulatory referral to Neurology   Complete by: As directed    An appointment is requested in approximately:2-3 Week(s): new seizures   Diet Carb Modified   Complete by: As directed    Discharge instructions   Complete by: As directed    It is VERY IMPORTANT that you follow up with a PCP on a regular basis.  Check your blood glucoses before each meal and at bedtime and maintain a log of your readings.  Bring this log with you when you follow up with your PCP so that he or she can adjust your insulin at your follow up visit.   Increase activity slowly   Complete by: As directed      Allergies as of 11/11/2019      Reactions   Latex Hives      Medication List    STOP taking these medications   insulin NPH-regular Human (70-30) 100 UNIT/ML injection     TAKE these medications   aspirin EC 81 MG tablet Take 81 mg by mouth daily.   Cholecalciferol 125 MCG (5000 UT) Tabs Take 5,000 Units by mouth daily.   cloBAZam 10 MG tablet Commonly known as: ONFI Take 0.5 tablets (5 mg total) by mouth 2 (two) times daily. Refill by PCP or neurology   feeding supplement (ENSURE ENLIVE) Liqd Take 237 mLs by mouth 3 (three) times daily between meals.   insulin aspart 100 UNIT/ML injection Commonly known as: novoLOG Inject 0-6 Units into the  skin 3 (three) times daily with meals.   insulin aspart 100 UNIT/ML injection Commonly known as: novoLOG Inject 3 Units into the skin 3 (three) times daily with meals.   insulin detemir 100 UNIT/ML injection Commonly known as: LEVEMIR Inject 0.1 mLs (10 Units total) into the skin 2 (two) times daily.   levETIRAcetam 750 MG tablet Commonly known as: KEPPRA Take 2 tablets (1,500 mg total) by mouth 2 (two) times daily.   levonorgestrel 20 MCG/24HR IUD Commonly known as: MIRENA 1 each  by Intrauterine route once.   levothyroxine 150 MCG tablet Commonly known as: SYNTHROID Take 150 mcg by mouth every morning.   liothyronine 5 MCG tablet Commonly known as: CYTOMEL Take 5 mcg by mouth daily. Take in addition to levothyroxine.   lisinopril 20 MG tablet Commonly known as: ZESTRIL Take 20 mg by mouth daily.   metoprolol tartrate 50 MG tablet Commonly known as: LOPRESSOR Take 1 tablet (50 mg total) by mouth 2 (two) times daily.   multivitamin with minerals Tabs tablet Take 1 tablet by mouth daily.   polyethylene glycol 17 g packet Commonly known as: MIRALAX / GLYCOLAX Take 17 g by mouth daily as needed for moderate constipation.   potassium chloride SA 20 MEQ tablet Commonly known as: KLOR-CON Take 1 tablet (20 mEq total) by mouth daily. What changed: when to take this   psyllium 95 % Pack Commonly known as: HYDROCIL/METAMUCIL Take 1 packet by mouth 2 (two) times daily.   rosuvastatin 20 MG tablet Commonly known as: CRESTOR Take 20 mg by mouth at bedtime.   senna-docusate 8.6-50 MG tablet Commonly known as: Senokot S Take 2 tablets by mouth at bedtime.   tolterodine 4 MG 24 hr capsule Commonly known as: Detrol LA Take 1 capsule (4 mg total) by mouth daily.            Durable Medical Equipment  (From admission, onward)         Start     Ordered   10/27/19 0837  For home use only DME 3 n 1  Once        10/27/19 0836   10/27/19 0837  For home use only DME Walker rolling  Once       Question Answer Comment  Walker: With 5 Inch Wheels   Patient needs a walker to treat with the following condition Gait instability      10/27/19 0836          Follow-up Information    Bright, Aaron Mose, MD. Schedule an appointment as soon as possible for a visit in 1 week(s).   Specialty: Internal Medicine Contact information: [place] [address] [place]. Schedule an appointment as soon  as possible for a visit in 2 week(s).   Contact information: [address]     [address] [place] [address] [phone]             Allergies  Allergen Reactions  . Latex Hives    You were cared for by a hospitalist during your hospital stay. If you have any questions about your discharge medications or the care you received while you were in the hospital after you are discharged, you can call the unit and asked to speak with the hospitalist on call if the hospitalist that took care of you is not available. Once you are discharged, your primary care physician will handle any further medical  issues. Please note that no refills for any discharge medications will be authorized once you are discharged, as it is imperative that you return to your primary care physician (or establish a relationship with a primary care physician if you do not have one) for your aftercare needs so that they can reassess your need for medications and monitor your lab values.   Procedures/Studies: CT CHEST W CONTRAST  Result Date: 10/23/2019 CLINICAL DATA:  Seizure, abnormal CT abdomen with basilar lung changes EXAM: CT CHEST WITH CONTRAST TECHNIQUE: Multidetector CT imaging of the chest was performed during intravenous contrast administration. Sagittal and coronal MPR images reconstructed from axial data set. CONTRAST:  66mL OMNIPAQUE IOHEXOL 300 MG/ML  SOLN IV COMPARISON:  CT abdomen and pelvis 10/21/2019 FINDINGS: Cardiovascular: Atherosclerotic calcification aorta, proximal great vessels and coronary arteries. Aorta normal caliber. Heart normal size. No pericardial effusion. Mediastinum/Nodes: Esophagus unremarkable. Base of cervical region normal appearance. No thoracic adenopathy. Lungs/Pleura: Linear areas of atelectasis versus scarring scattered in both lungs. Patchy areas of peribronchovascular density are seen in both lungs, some associated with minimal bronchiectasis, favor post  inflammatory. Central peribronchial thickening present. No segmental consolidation, pleural effusion or pneumothorax. 3 mm LEFT upper lobe nodule image 30. No additional pulmonary mass/nodule. Upper Abdomen: Stomach distended by food debris. Remaining upper abdomen unremarkable Musculoskeletal: No osseous abnormalities. Scattered chest wall and axillary soft tissue edema, nonspecific. IMPRESSION: Scattered areas of atelectasis versus scarring in both lungs with few scattered areas of peribronchovascular opacity bilaterally, a few of which are associated with minimal bronchiectasis, favor postinflammatory scarring. Single 3 mm nonspecific LEFT upper lobe nodule. Short-term follow-up CT recommended in 3 months to reassess the areas of suspected scarring to ensure stability. Aortic Atherosclerosis (ICD10-I70.0). Electronically Signed   By: Lavonia Dana M.D.   On: 10/23/2019 13:00   MR BRAIN WO CONTRAST  Result Date: 10/19/2019 CLINICAL DATA:  Stroke follow-up. Left-sided weakness. Hypoglycemia. Witnessed seizures by EMS. Clinically suspected Todd's paralysis. EXAM: MRI HEAD WITHOUT CONTRAST TECHNIQUE: Multiplanar, multiecho pulse sequences of the brain and surrounding structures were obtained without intravenous contrast. COMPARISON:  Head CT 10/19/2019 FINDINGS: The study is mildly motion degraded. Brain: There is mild restricted diffusion involving cortex in the medial right frontal lobe, right insula, and likely right hippocampus which does not conform to a vascular territory and is without associated edema. No intracranial hemorrhage, mass, midline shift, or extra-axial fluid collection is identified. No significant white matter disease is seen for age. The ventricles and sulci are normal. Vascular: Major intracranial vascular flow voids are preserved. Skull and upper cervical spine: Unremarkable bone marrow signal para Sinuses/Orbits: Bilateral cataract extraction. Small mucous retention cysts in the maxillary  sinuses. Clear mastoid air cells. Other: None. IMPRESSION: Mild restricted diffusion in the medial right frontal lobe, right insula, and likely right hippocampus most consistent with recent seizure activity. Electronically Signed   By: Logan Bores M.D.   On: 10/19/2019 16:48   MR BRAIN W CONTRAST  Result Date: 10/20/2019 CLINICAL DATA:  Seizure.  Abnormal MRI. EXAM: MRI HEAD WITH CONTRAST TECHNIQUE: Multiplanar, multiecho pulse sequences of the brain and surrounding structures were obtained with intravenous contrast. CONTRAST:  58mL GADAVIST GADOBUTROL 1 MMOL/ML IV SOLN COMPARISON:  MRI head without contrast 05/21/2019 FINDINGS: Brain: Review of the MRI from yesterday reveals restricted diffusion in the right medial frontal cortex, also in the right insula and right hippocampus. Note that the lesions are best seen on diffusion-weighted imaging with no significant associated FLAIR cortical edema. These findings  are unilateral. No abnormal enhancement on today's study. Ventricle size is normal. Diffusion-weighted imaging was not repeated today IMPRESSION: Normal enhancement of the brain. Based on the findings yesterday, differential includes seizure related cortical activity, autoimmune related encephalitis, and less likely viral encephalitis. Consider lumbar puncture. Short-term follow-up MRI with contrast may be helpful. Electronically Signed   By: Franchot Gallo M.D.   On: 10/20/2019 16:40   CT ABDOMEN PELVIS W CONTRAST  Result Date: 10/22/2019 CLINICAL DATA:  Unintended weight loss. EXAM: CT ABDOMEN AND PELVIS WITH CONTRAST TECHNIQUE: Multidetector CT imaging of the abdomen and pelvis was performed using the standard protocol following bolus administration of intravenous contrast. CONTRAST:  199mL OMNIPAQUE IOHEXOL 300 MG/ML  SOLN COMPARISON:  None. FINDINGS: Lower chest: Majority of the chest is included in the field of view. Scattered linear and bandlike opacities throughout both lungs are typical of  scarring. There is a subpleural 5 mm left upper lobe pulmonary nodule, series 6, image 26. Scattered peribronchovascular irregular opacities in the right lower lobe series 6, image 53, right middle lobe, series 6, image 39, left lower lobe, same image, left upper lobe series 6, image 29, and right mid lung same image. No dominant pulmonary mass. Hepatobiliary: Scattered subcentimeter hypodensities in the liver are too small to characterize but likely small cysts or biliary hamartomas. No evidence of suspicious liver lesion. Gallbladder physiologically distended, no calcified stone. No biliary dilatation. Pancreas: No ductal dilatation or inflammation. Spleen: Normal in size without focal abnormality. Adrenals/Urinary Tract: Normal adrenal glands. There is right hydroureteronephrosis. The ureter is dilated to pelvic brim. There is mild left hydronephrosis and proximal hydroureter, with similar ureteral dilatation. Diminished excretion on delayed phase imaging, right greater than left. No evidence of focal renal mass. Urinary bladder is displaced anteriorly by large stool ball in the rectum. Stomach/Bowel: Large inspissated stool ball distends the rectum with rectal distention of 8.6 cm. Associated rectal wall thickening and perirectal edema. No pneumatosis. Approximately 6.3 cm length area of sigmoid colonic wall thickening with pericolonic edema just proximal to the large volume of rectosigmoid stool. Large volume of stool throughout the remainder of the colon, there is significant sigmoid colonic redundancy. No additional areas of colonic wall thickening. The appendix is not confidently visualized. The stomach is unremarkable. No small bowel obstruction or abnormal distention. No definite small bowel inflammation. Vascular/Lymphatic: Moderate aorto bi-iliac atherosclerosis, advanced for age. No aortic aneurysm. Patent portal vein. There is no adenopathy in the abdomen or pelvis. Reproductive: Calcified uterine  fibroids. The uterus is displaced into the right pelvis and anteriorly by large rectosigmoid stool burden. No evidence of adnexal mass. Other: Perirectal and distal pericolonic edema with small amount of presacral fluid. No other ascites. There is no free air or perforation. Mild generalized body wall edema. Musculoskeletal: Mild L1 superior endplate compression fracture, age indeterminate. No evidence of focal bone lesion. IMPRESSION: 1. Large inspissated stool ball distends the rectum with rectal distention of 8.6 cm. Associated rectal wall thickening and perirectal edema, suspicious for fecal impaction. The stool distended rectum causes mass effect on the urinary bladder, uterus, and distal ureters with bilateral hydroureteronephrosis, right greater than left. 2. Approximately 6.3 cm length area of sigmoid colonic wall thickening with pericolonic edema just proximal to the large volume of rectosigmoid stool. This may represent focal colitis, including stercoral colitis versus colonic neoplasm. 3. Large volume of stool throughout the remainder of the colon. There is significant sigmoid colonic redundancy. 4. Scattered peribronchovascular irregular opacities in the lung bases,  likely infectious or inflammatory. There is a 5 mm subpleural left upper lobe pulmonary nodule. No follow-up needed if patient is low-risk (and has no known or suspected primary neoplasm). Non-contrast chest CT can be considered in 12 months if patient is high-risk. This recommendation follows the consensus statement: Guidelines for Management of Incidental Pulmonary Nodules Detected on CT Images: From the Fleischner Society 2017; Radiology 2017; 284:228-243. 5. Mild L1 superior endplate compression fracture, age indeterminate. Aortic Atherosclerosis (ICD10-I70.0). Electronically Signed   By: Keith Rake M.D.   On: 10/22/2019 00:00   DG CHEST PORT 1 VIEW  Result Date: 10/19/2019 CLINICAL DATA:  59 year old female with left-sided  weakness. Concern for stroke. EXAM: PORTABLE CHEST 1 VIEW COMPARISON:  Chest radiograph dated 01/14/2005. FINDINGS: No focal consolidation, pleural effusion or pneumothorax. The cardiac silhouette is within limits. No acute osseous pathology. IMPRESSION: No active disease. Electronically Signed   By: Anner Crete M.D.   On: 10/19/2019 19:28   EEG adult  Result Date: 10/19/2019 Lora Havens, MD     10/19/2019  8:23 PM Patient Name: Andrea [name] MRN: [id number] Epilepsy Attending: Lora Havens Referring Physician/Provider: Dr Amie [place] Date: 10/19/2019 Duration: 30.57 mins Patient history: 59yo F who presented with ams and seizure. EEG to evaluate for seizure, Level of alertness: Awake AEDs during EEG study:  keppra Technical aspects: This EEG study was done with scalp electrodes positioned according to the 10-20 International system of electrode placement. Electrical activity was acquired at a sampling rate of 500Hz  and reviewed with a high frequency filter of 70Hz  and a low frequency filter of 1Hz . EEG data were recorded continuously and digitally stored. Description: No posterior dominant rhythm was seen. EEG showed generalize, maximal bifrontal periodic epileptiform discharges with triphasic morphology at 0.25-0.5Hz . EEG also showed continuous generalized 8-9Hz  alpha activity.  Hyperventilation and photic stimulation were not performed.   ABNORMALITY -Periodic epileptiform discharges with triphasic morphology, generalized, maximal bifrontal IMPRESSION: This study showed evidence of generalized epileptogenicity with epileptiform discharges at 0.25-0.5hz  which is on the ictal-interictal continuum. No definite seizures were seen throughout the recording. Dr Cheral Marker was notified. Priyanka Barbra Sarks   Overnight EEG with video  Result Date: 10/20/2019 Lora Havens, MD     10/21/2019  1:03 PM Patient Name: Dianelly Ferran MRN: [id number] Epilepsy Attending: Lora Havens Referring  Physician/Provider: Dr Milus Banister Duration: 10/19/2019 2027 to 10/20/2019 2027  Patient history: 59yo F who presented with ams and seizure. EEG to evaluate for seizure,  Level of alertness: Awake  AEDs during EEG study:  keppra  Technical aspects: This EEG study was done with scalp electrodes positioned according to the 10-20 International system of electrode placement. Electrical activity was acquired at a sampling rate of 500Hz  and reviewed with a high frequency filter of 70Hz  and a low frequency filter of 1Hz . EEG data were recorded continuously and digitally stored.  Description: No posterior dominant rhythm was seen. EEG initially showed generalized, maximal bifrontal periodic epileptiform discharges with triphasic morphology at 0.25-0.5Hz . Gradually, eeg showed right frontocentral periodic epileptiform discharges at 1Hz . EEG also showed continuous generalized 8-9Hz  alpha activity. Event button was pressed multiple times during which patient was noted to have left side face twitching, lasting about 30 seconds to one minute. Concomitant eeg showed rhythmic sharply contoured 5-9h generalized theta-alpha activity which evolved into 3-5Hz  theta-delta activity.  ABNORMALITY - Seizure, generalized - Periodic epileptiform discharges with triphasic morphology, generalized, maximal bifrontal - Continuous slow, generalized  IMPRESSION: This study showed multiple seizures  with generalized onset during which patient was noted to have left face twitching, lasting 30 seconds to one minute. Additionally, there are epileptiform discharges at 0.25-0.5hz  which is on the ictal-interictal continuum as well as moderate diffuse encephalopathy, non specific to etiology.  Lora Havens   CT HEAD CODE STROKE WO CONTRAST  Result Date: 10/19/2019 CLINICAL DATA:  Code stroke.  Left-sided weakness. EXAM: CT HEAD WITHOUT CONTRAST TECHNIQUE: Contiguous axial images were obtained from the base of the skull through the vertex  without intravenous contrast. COMPARISON:  None. FINDINGS: The study is mildly motion degraded. Brain: Within limitations of motion, no acute infarct, intracranial hemorrhage, mass, midline shift, or extra-axial fluid collection is identified. The ventricles and sulci are normal. Vascular: Calcified atherosclerosis at the skull base. No hyperdense vessel. Skull: No fracture or suspicious osseous lesion. Sinuses/Orbits: Partially visualized mild mucosal thickening in the left maxillary sinus. Bilateral cataract extraction. Other: None. ASPECTS [facility] Stroke Program Early CT Score) - Ganglionic level infarction (caudate, lentiform nuclei, internal capsule, insula, M1-M3 cortex): 7 - Supraganglionic infarction (M4-M6 cortex): 3 Total score (0-10 with 10 being normal): 10 IMPRESSION: 1. No evidence of acute intracranial abnormality within limitations of mild motion. 2. ASPECTS is 10. These results were communicated to Dr. Rory Percy at 3:50 pm on 10/19/2019 by text page via the [facility] messaging system. Electronically Signed   By: Logan Bores M.D.   On: 10/19/2019 15:50     The results of significant diagnostics from this hospitalization (including imaging, microbiology, ancillary and laboratory) are listed below for reference.     Microbiology: Recent Results (from the past 240 hour(s))  MRSA PCR Screening     Status: None   Collection Time: 11/02/19  5:30 AM   Specimen: Nasopharyngeal  Result Value Ref Range Status   MRSA by PCR NEGATIVE NEGATIVE Final    Comment:        The GeneXpert MRSA Assay (FDA approved for NASAL specimens only), is one component of a comprehensive MRSA colonization surveillance program. It is not intended to diagnose MRSA infection nor to guide or monitor treatment for MRSA infections. Performed at [facility], [place] [address], [place], [place] [address]   SARS Coronavirus 2 by RT PCR (hospital order, performed in [facility] hospital lab) Nasopharyngeal  Nasopharyngeal Swab     Status: None   Collection Time: 11/06/19 10:36 AM   Specimen: Nasopharyngeal Swab  Result Value Ref Range Status   SARS Coronavirus 2 NEGATIVE NEGATIVE Final    Comment: (NOTE) SARS-CoV-2 target nucleic acids are NOT DETECTED.  The SARS-CoV-2 RNA is generally detectable in upper and lower respiratory specimens during the acute phase of infection. The lowest concentration of SARS-CoV-2 viral copies this assay can detect is 250 copies / mL. A negative result does not preclude SARS-CoV-2 infection and should not be used as the sole basis for treatment or other patient management decisions.  A negative result may occur with improper specimen collection / handling, submission of specimen other than nasopharyngeal swab, presence of viral mutation(s) within the areas targeted by this assay, and inadequate number of viral copies (<250 copies / mL). A negative result must be combined with clinical observations, patient history, and epidemiological information.  Fact Sheet for Patients:   StrictlyIdeas.no  Fact Sheet for Healthcare Providers: BankingDealers.co.za  This test is not yet approved or  cleared by the [place] FDA and has been authorized for detection and/or diagnosis of SARS-CoV-2 by FDA under an Emergency Use Authorization (EUA).  This EUA will remain in effect (meaning this test can be used) for the duration of the COVID-19 declaration under Section 564(b)(1) of the Act, 21 U.S.C. section 360bbb-3(b)(1), unless the authorization is terminated or revoked sooner.  Performed at [facility], [place] [address] [place], [place] [address]      Labs: BNP (last 3 results) No results for input(s): BNP in the last 8760 hours. Basic Metabolic Panel: Recent Labs  Lab 11/06/19 0413 11/07/19 0628 11/08/19 0442 11/09/19 0455 11/10/19 0500  NA 139 139 137 139 138  K 3.3* 3.7 3.6 4.1 3.9  CL 106 108  104 106 104  CO2 25 23 25 25 24   GLUCOSE 194* 221* 163* 197* 246*  BUN 12 14 15 14 16   CREATININE 0.62 0.52 0.54 0.54 0.65  CALCIUM 8.9 8.8* 8.9 9.0 9.1  MG 1.6* 1.8 1.8 1.7 2.0   Liver Function Tests: No results for input(s): AST, ALT, ALKPHOS, BILITOT, PROT, ALBUMIN in the last 168 hours. No results for input(s): LIPASE, AMYLASE in the last 168 hours. No results for input(s): AMMONIA in the last 168 hours. CBC: Recent Labs  Lab 11/06/19 0413 11/07/19 0628 11/08/19 0442 11/09/19 0455 11/10/19 0500  WBC 6.6 6.0 6.3 6.5 8.3  HGB 8.8* 8.8* 8.7* 9.1* 9.4*  HCT 26.9* 27.1* 27.5* 28.0* 28.1*  MCV 89.7 89.1 89.6 90.0 88.9  PLT 197 175 176 162 180   Cardiac Enzymes: No results for input(s): CKTOTAL, CKMB, CKMBINDEX, TROPONINI in the last 168 hours. BNP: Invalid input(s): POCBNP CBG: Recent Labs  Lab 11/10/19 1148 11/10/19 1729 11/10/19 2145 11/11/19 0620 11/11/19 0729  GLUCAP 223* 143* 282* 152* 177*   D-Dimer No results for input(s): DDIMER in the last 72 hours. Hgb A1c No results for input(s): HGBA1C in the last 72 hours. Lipid Profile No results for input(s): CHOL, HDL, LDLCALC, TRIG, CHOLHDL, LDLDIRECT in the last 72 hours. Thyroid function studies No results for input(s): TSH, T4TOTAL, T3FREE, THYROIDAB in the last 72 hours.  Invalid input(s): FREET3 Anemia work up No results for input(s): VITAMINB12, FOLATE, FERRITIN, TIBC, IRON, RETICCTPCT in the last 72 hours. Urinalysis    Component Value Date/Time   COLORURINE COLORLESS (A) 10/08/2019 1848   APPEARANCEUR CLEAR (A) 10/08/2019 1848   LABSPEC 1.017 10/08/2019 1848   PHURINE 6.0 10/08/2019 1848   GLUCOSEU >=500 (A) 10/08/2019 1848   HGBUR NEGATIVE 10/08/2019 1848   BILIRUBINUR NEGATIVE 10/08/2019 1848   KETONESUR NEGATIVE 10/08/2019 1848   PROTEINUR NEGATIVE 10/08/2019 1848   NITRITE NEGATIVE 10/08/2019 1848   LEUKOCYTESUR NEGATIVE 10/08/2019 1848   Sepsis Labs Invalid input(s): PROCALCITONIN,  WBC,   LACTICIDVEN Microbiology Recent Results (from the past 240 hour(s))  MRSA PCR Screening     Status: None   Collection Time: 11/02/19  5:30 AM   Specimen: Nasopharyngeal  Result Value Ref Range Status   MRSA by PCR NEGATIVE NEGATIVE Final    Comment:        The GeneXpert MRSA Assay (FDA approved for NASAL specimens only), is one component of a comprehensive MRSA colonization surveillance program. It is not intended to diagnose MRSA infection nor to guide or monitor treatment for MRSA infections. Performed at [facility], [place] [address] [place], [place] [address]   SARS Coronavirus 2 by RT PCR (hospital order, performed in [facility] hospital lab) Nasopharyngeal Nasopharyngeal Swab     Status: None   Collection Time: 11/06/19 10:36 AM   Specimen: Nasopharyngeal Swab  Result Value Ref  Range Status   SARS Coronavirus 2 NEGATIVE NEGATIVE Final    Comment: (NOTE) SARS-CoV-2 target nucleic acids are NOT DETECTED.  The SARS-CoV-2 RNA is generally detectable in upper and lower respiratory specimens during the acute phase of infection. The lowest concentration of SARS-CoV-2 viral copies this assay can detect is 250 copies / mL. A negative result does not preclude SARS-CoV-2 infection and should not be used as the sole basis for treatment or other patient management decisions.  A negative result may occur with improper specimen collection / handling, submission of specimen other than nasopharyngeal swab, presence of viral mutation(s) within the areas targeted by this assay, and inadequate number of viral copies (<250 copies / mL). A negative result must be combined with clinical observations, patient history, and epidemiological information.  Fact Sheet for Patients:   StrictlyIdeas.no  Fact Sheet for Healthcare Providers: BankingDealers.co.za  This test is not yet approved or  cleared by the [place] FDA and has been  authorized for detection and/or diagnosis of SARS-CoV-2 by FDA under an Emergency Use Authorization (EUA).  This EUA will remain in effect (meaning this test can be used) for the duration of the COVID-19 declaration under Section 564(b)(1) of the Act, 21 U.S.C. section 360bbb-3(b)(1), unless the authorization is terminated or revoked sooner.  Performed at [facility], [place] [address] [place], [place] [address]      Time coordinating discharge:  I have spent 35 minutes face to face with the patient and on the ward discussing the patients care, assessment, plan and disposition with other care givers. >50% of the time was devoted counseling the patient about the risks and benefits of treatment/Discharge disposition and coordinating care.   SIGNED:   Damita Lack, MD  [facility] 11/11/2019, 10:14 AM   If 7PM-7AM, please contact night-coverage

## 2019-11-06 NOTE — Progress Notes (Signed)
This chaplain followed up on initial visit with Pt. for [place].  Currently, the Pt. is waiting on the grand-daughters presence to complete HCPOA.  The chaplain informed the Pt. on how to complete HCPOA outside the hospital.  The Pt. appreciated the information. F/U spiritual care is available as needed.

## 2019-11-07 LAB — GLUCOSE, CAPILLARY
Glucose-Capillary: 173 mg/dL — ABNORMAL HIGH (ref 70–99)
Glucose-Capillary: 219 mg/dL — ABNORMAL HIGH (ref 70–99)
Glucose-Capillary: 228 mg/dL — ABNORMAL HIGH (ref 70–99)
Glucose-Capillary: 245 mg/dL — ABNORMAL HIGH (ref 70–99)
Glucose-Capillary: 317 mg/dL — ABNORMAL HIGH (ref 70–99)

## 2019-11-07 LAB — CBC
HCT: 27.1 % — ABNORMAL LOW (ref 36.0–46.0)
Hemoglobin: 8.8 g/dL — ABNORMAL LOW (ref 12.0–15.0)
MCH: 28.9 pg (ref 26.0–34.0)
MCHC: 32.5 g/dL (ref 30.0–36.0)
MCV: 89.1 fL (ref 80.0–100.0)
Platelets: 175 10*3/uL (ref 150–400)
RBC: 3.04 MIL/uL — ABNORMAL LOW (ref 3.87–5.11)
RDW: 14.5 % (ref 11.5–15.5)
WBC: 6 10*3/uL (ref 4.0–10.5)
nRBC: 0 % (ref 0.0–0.2)

## 2019-11-07 LAB — BASIC METABOLIC PANEL
Anion gap: 8 (ref 5–15)
BUN: 14 mg/dL (ref 6–20)
CO2: 23 mmol/L (ref 22–32)
Calcium: 8.8 mg/dL — ABNORMAL LOW (ref 8.9–10.3)
Chloride: 108 mmol/L (ref 98–111)
Creatinine, Ser: 0.52 mg/dL (ref 0.44–1.00)
GFR calc Af Amer: >60 mL/min (ref >60)
GFR calc non Af Amer: >60 mL/min (ref >60)
Glucose, Bld: 221 mg/dL — ABNORMAL HIGH (ref 70–99)
Potassium: 3.7 mmol/L (ref 3.5–5.1)
Sodium: 139 mmol/L (ref 135–145)

## 2019-11-07 LAB — MAGNESIUM: Magnesium: 1.8 mg/dL (ref 1.7–2.4)

## 2019-11-07 NOTE — Progress Notes (Signed)
Occupational Therapy Treatment Patient Details Name: Andrea [name] MRN: [id number] DOB: 1961-03-20 Today's Date: 11/07/2019    History of present illness 59 y.o. female with past medical history significant for diabetes insulin-dependent, hyperlipidemia, hypothyroidism, who was just evaluated at [facility], ER on 6/19 for hypoglycemia of 24, patient was initially nonverbal with generalized weakness at that time.  Hypoglycemia was corrected and she was discharged home.  Today family called EMS because patient's blood sugar was again low at 30.  Patient was also noted to have  left-sided weakness.  Patient was last seen normal at 2 AM.  When EMS was on the scene patient had 2 focal seizures.  Patient received IV Versed and glucose.  On arrival to the ED patient had still mild left-sided weakness and dysarthria. Pt underwent colonscopy on 7/4.Started on Keppra and EEG ordered. Neurology was consulted.Hospital course remarkable for severe constipation.she developed twitching of face muscles on 09/2319.  She underwent LP by neurology suspecting for autoimmune/inflammatory disorder.  Started on high-dose steroids and completed course in the hospital.    OT comments  Pt completed ADL task with min guard A for mobility and ADL tasks and VSS. PT with slow processing and demsonstrates decreased STM, decreased awareness of safety and decreased anticipatory awareness. Pt is unable to remember what she had to eat for breakfast and does not know if she has ordered lunch. Pt currently needs 24/7 S. Recommend post acute rehab to facilitate safe DC home. Will continue to follow acutely.   Follow Up Recommendations  SNF;Supervision/Assistance - 24 hour    Equipment Recommendations  3 in 1 bedside commode    Recommendations for Other Services      Precautions / Restrictions Precautions Precautions: Fall       Mobility Bed Mobility Overal bed mobility: Modified Independent                 Transfers Overall transfer level: Needs assistance   Transfers: Sit to/from Stand;Stand Pivot Transfers Sit to Stand: Supervision              Balance Overall balance assessment: Needs assistance   Sitting balance-Leahy Scale: Good       Standing balance-Leahy Scale: Fair                             ADL either performed or assessed with clinical judgement   ADL Overall ADL's : Needs assistance/impaired Eating/Feeding: Set up;Sitting   Grooming: Supervision/safety;Standing   Upper Body Bathing: Set up;Sitting   Lower Body Bathing: Supervison/ safety;Sit to/from stand   Upper Body Dressing : Set up;Sitting   Lower Body Dressing: Supervision/safety;Sit to/from stand   Toilet Transfer: Ambulation;RW;Grab bars;Min guard   Toileting- Clothing Manipulation and Hygiene: Supervision/safety       Functional mobility during ADLs: Min guard General ADL Comments: slow processing     Vision       Perception     Praxis      Cognition Arousal/Alertness: Lethargic Behavior During Therapy: Flat affect Overall Cognitive Status: Impaired/Different from baseline Area of Impairment: Memory;Attention;Safety/judgement;Awareness;Problem solving                   Current Attention Level: Selective Memory: Decreased short-term memory   Safety/Judgement: Decreased awareness of safety;Decreased awareness of deficits Awareness: Emergent Problem Solving: Slow processing General Comments: running  into sink and door with RW, requires VC to correct        Exercises  Shoulder Instructions       General Comments      Pertinent Vitals/ Pain       Pain Assessment: Faces Faces Pain Scale: No hurt  Home Living                                          Prior Functioning/Environment              Frequency  Min 2X/week        Progress Toward Goals  OT Goals(current goals can now be found in the care plan section)   Progress towards OT goals: Goals met and updated - see care plan  Acute Rehab OT Goals Patient Stated Goal: to brush her teeth OT Goal Formulation: With patient Time For Goal Achievement: 11/21/19 Potential to Achieve Goals: Good ADL Goals Pt Will Perform Grooming: with modified independence;standing  Plan Discharge plan remains appropriate;Frequency remains appropriate    Co-evaluation                 AM-PAC OT "6 Clicks" Daily Activity     Outcome Measure   Help from another person eating meals?: None Help from another person taking care of personal grooming?: A Little Help from another person toileting, which includes using toliet, bedpan, or urinal?: A Little Help from another person bathing (including washing, rinsing, drying)?: A Little Help from another person to put on and taking off regular upper body clothing?: A Little Help from another person to put on and taking off regular lower body clothing?: A Little 6 Click Score: 19    End of Session Equipment Utilized During Treatment: Gait belt;Rolling walker  OT Visit Diagnosis: Unsteadiness on feet (R26.81);Other symptoms and signs involving cognitive function;Muscle weakness (generalized) (M62.81)   Activity Tolerance Patient tolerated treatment well   Patient Left in chair;with call bell/phone within reach;with chair alarm set;with nursing/sitter in room   Nurse Communication Mobility status        Time: 3295-1884 OT Time Calculation (min): 32 min  Charges: OT General Charges $OT Visit: 1 Visit OT Treatments $Self Care/Home Management : 23-37 mins  Maurie Boettcher, OT/L   Acute OT Clinical Specialist [place] Pager [phone] Office [phone]    [facility] 11/07/2019, 12:45 PM

## 2019-11-07 NOTE — Progress Notes (Signed)
Patient remained stable no further changes.  Refer to discharge summary from yesterday.  Discharge summary updated  Seen and examined at bedside, no complaints she is resting comfortably.  Discussed with patient's RN, TOC team and charge nurse.  Please call with further questions as needed  Gerlean Ren MD [facility]

## 2019-11-07 NOTE — Plan of Care (Signed)
  Problem: Education: Goal: Knowledge of General Education information will improve Description: Including pain rating scale, medication(s)/side effects and non-pharmacologic comfort measures Outcome: Progressing   Problem: Health Behavior/Discharge Planning: Goal: Ability to manage health-related needs will improve Outcome: Progressing   Problem: Clinical Measurements: Goal: Ability to maintain clinical measurements within normal limits will improve Outcome: Progressing   Problem: Activity: Goal: Risk for activity intolerance will decrease Outcome: Progressing   Problem: Nutrition: Goal: Adequate nutrition will be maintained Outcome: Progressing   Problem: Coping: Goal: Level of anxiety will decrease Outcome: Progressing   Problem: Elimination: Goal: Will not experience complications related to bowel motility Outcome: Progressing   Problem: Pain Managment: Goal: General experience of comfort will improve Outcome: Progressing   Problem: Safety: Goal: Ability to remain free from injury will improve Outcome: Progressing   Problem: Skin Integrity: Goal: Risk for impaired skin integrity will decrease Outcome: Progressing   Problem: Education: Goal: Expressions of having a comfortable level of knowledge regarding the disease process will increase Outcome: Progressing   Problem: Coping: Goal: Ability to adjust to condition or change in health will improve Outcome: Progressing   Problem: Health Behavior/Discharge Planning: Goal: Compliance with prescribed medication regimen will improve Outcome: Progressing   Problem: Medication: Goal: Risk for medication side effects will decrease Outcome: Progressing   Problem: Clinical Measurements: Goal: Complications related to the disease process, condition or treatment will be avoided or minimized Outcome: Progressing   Problem: Safety: Goal: Verbalization of understanding the information provided will improve Outcome:  Progressing   Problem: Self-Concept: Goal: Level of anxiety will decrease Outcome: Progressing

## 2019-11-07 NOTE — Plan of Care (Signed)
Pt alert and oriented. Pt on RA. Tele in placing and HR in low 100's. Airway safety supplies at bedside.  Problem: Education: Goal: Knowledge of General Education information will improve Description: Including pain rating scale, medication(s)/side effects and non-pharmacologic comfort measures Outcome: Progressing   Problem: Health Behavior/Discharge Planning: Goal: Ability to manage health-related needs will improve Outcome: Progressing   Problem: Clinical Measurements: Goal: Ability to maintain clinical measurements within normal limits will improve Outcome: Progressing   Problem: Activity: Goal: Risk for activity intolerance will decrease Outcome: Progressing   Problem: Nutrition: Goal: Adequate nutrition will be maintained Outcome: Progressing   Problem: Coping: Goal: Level of anxiety will decrease Outcome: Progressing   Problem: Elimination: Goal: Will not experience complications related to bowel motility Outcome: Progressing   Problem: Pain Managment: Goal: General experience of comfort will improve Outcome: Progressing   Problem: Safety: Goal: Ability to remain free from injury will improve Outcome: Progressing   Problem: Skin Integrity: Goal: Risk for impaired skin integrity will decrease Outcome: Progressing   Problem: Education: Goal: Expressions of having a comfortable level of knowledge regarding the disease process will increase Outcome: Progressing   Problem: Coping: Goal: Ability to adjust to condition or change in health will improve Outcome: Progressing   Problem: Health Behavior/Discharge Planning: Goal: Compliance with prescribed medication regimen will improve Outcome: Progressing   Problem: Medication: Goal: Risk for medication side effects will decrease Outcome: Progressing   Problem: Clinical Measurements: Goal: Complications related to the disease process, condition or treatment will be avoided or minimized Outcome: Progressing    Problem: Safety: Goal: Verbalization of understanding the information provided will improve Outcome: Progressing   Problem: Self-Concept: Goal: Level of anxiety will decrease Outcome: Progressing

## 2019-11-08 LAB — GLUCOSE, CAPILLARY
Glucose-Capillary: 142 mg/dL — ABNORMAL HIGH (ref 70–99)
Glucose-Capillary: 158 mg/dL — ABNORMAL HIGH (ref 70–99)
Glucose-Capillary: 149 mg/dL — ABNORMAL HIGH (ref 70–99)
Glucose-Capillary: 323 mg/dL — ABNORMAL HIGH (ref 70–99)

## 2019-11-08 LAB — CBC
HCT: 27.5 % — ABNORMAL LOW (ref 36.0–46.0)
Hemoglobin: 8.7 g/dL — ABNORMAL LOW (ref 12.0–15.0)
MCH: 28.3 pg (ref 26.0–34.0)
MCHC: 31.6 g/dL (ref 30.0–36.0)
MCV: 89.6 fL (ref 80.0–100.0)
Platelets: 176 10*3/uL (ref 150–400)
RBC: 3.07 MIL/uL — ABNORMAL LOW (ref 3.87–5.11)
RDW: 14.5 % (ref 11.5–15.5)
WBC: 6.3 10*3/uL (ref 4.0–10.5)
nRBC: 0 % (ref 0.0–0.2)

## 2019-11-08 LAB — BASIC METABOLIC PANEL
Anion gap: 8 (ref 5–15)
BUN: 15 mg/dL (ref 6–20)
CO2: 25 mmol/L (ref 22–32)
Calcium: 8.9 mg/dL (ref 8.9–10.3)
Chloride: 104 mmol/L (ref 98–111)
Creatinine, Ser: 0.54 mg/dL (ref 0.44–1.00)
GFR calc Af Amer: >60 mL/min (ref >60)
GFR calc non Af Amer: >60 mL/min (ref >60)
Glucose, Bld: 163 mg/dL — ABNORMAL HIGH (ref 70–99)
Potassium: 3.6 mmol/L (ref 3.5–5.1)
Sodium: 137 mmol/L (ref 135–145)

## 2019-11-08 LAB — MAGNESIUM: Magnesium: 1.8 mg/dL (ref 1.7–2.4)

## 2019-11-08 MED ORDER — POTASSIUM CHLORIDE CRYS ER 20 MEQ PO TBCR
40.0000 meq | EXTENDED_RELEASE_TABLET | Freq: Once | ORAL | Status: AC
  Administered 2019-11-08: 40 meq via ORAL
  Filled 2019-11-08: qty 2

## 2019-11-08 NOTE — Progress Notes (Signed)
No acute events overnight, awaiting better to SNF. Vital signs remained stable, tachycardia has slightly improved after starting metoprolol.  Plans will be to uptitrate metoprolol as tolerated.  Discharge summary completed on 7/8, updated on 7/10.  Stable for discharge when bed available.  Gerlean Ren MD [facility]

## 2019-11-09 LAB — CBC
HCT: 28 % — ABNORMAL LOW (ref 36.0–46.0)
Hemoglobin: 9.1 g/dL — ABNORMAL LOW (ref 12.0–15.0)
MCH: 29.3 pg (ref 26.0–34.0)
MCHC: 32.5 g/dL (ref 30.0–36.0)
MCV: 90 fL (ref 80.0–100.0)
Platelets: 162 10*3/uL (ref 150–400)
RBC: 3.11 MIL/uL — ABNORMAL LOW (ref 3.87–5.11)
RDW: 14.4 % (ref 11.5–15.5)
WBC: 6.5 10*3/uL (ref 4.0–10.5)
nRBC: 0 % (ref 0.0–0.2)

## 2019-11-09 LAB — BASIC METABOLIC PANEL
Anion gap: 8 (ref 5–15)
BUN: 14 mg/dL (ref 6–20)
CO2: 25 mmol/L (ref 22–32)
Calcium: 9 mg/dL (ref 8.9–10.3)
Chloride: 106 mmol/L (ref 98–111)
Creatinine, Ser: 0.54 mg/dL (ref 0.44–1.00)
GFR calc Af Amer: >60 mL/min (ref >60)
GFR calc non Af Amer: >60 mL/min (ref >60)
Glucose, Bld: 197 mg/dL — ABNORMAL HIGH (ref 70–99)
Potassium: 4.1 mmol/L (ref 3.5–5.1)
Sodium: 139 mmol/L (ref 135–145)

## 2019-11-09 LAB — GLUCOSE, CAPILLARY
Glucose-Capillary: 173 mg/dL — ABNORMAL HIGH (ref 70–99)
Glucose-Capillary: 295 mg/dL — ABNORMAL HIGH (ref 70–99)
Glucose-Capillary: 290 mg/dL — ABNORMAL HIGH (ref 70–99)
Glucose-Capillary: 249 mg/dL — ABNORMAL HIGH (ref 70–99)

## 2019-11-09 LAB — MAGNESIUM: Magnesium: 1.7 mg/dL (ref 1.7–2.4)

## 2019-11-09 MED ORDER — METOPROLOL TARTRATE 50 MG PO TABS: 50.0000 mg | ORAL_TABLET | Freq: Two times a day (BID) | ORAL | Status: AC

## 2019-11-09 MED ORDER — METOPROLOL TARTRATE 50 MG PO TABS
50.0000 mg | ORAL_TABLET | Freq: Two times a day (BID) | ORAL | Status: DC
  Administered 2019-11-09 – 2019-11-12 (×7): 50 mg via ORAL
  Filled 2019-11-09 (×8): qty 1

## 2019-11-09 NOTE — Progress Notes (Signed)
PROGRESS NOTE    Andrea [name]  XLK:[id number] DOB: Jul 28, 1960 DOA: 10/19/2019 PCP: [name], MD   Brief Narrative:  59 year old female with history of insulin-dependent diabetes mellitus, hyperlipidemia, hypothyroidism who was brought to the emergency department for evaluation of hypoglycemia. She was evaluated in the ED on 6/19 for blood glucose of 24, hypoglycemia was corrected and she was discharged home .  On 6/20, family called EMS as patient's blood glucose was again low at 30 and was noted to have dysarthria with left-sided weakness. When EMS arrived, patient had 2 focal seizures and was given IV Versed along with glucose. On presentation she still had mild left-sided weakness and dysarthria. She takes 70/30 insulin at home, her last dose before presentation was 80 units on the morning of 6/19. She did report recent increase in insulin dosage.  CT head:No Acute abnormality.MRI without contrast: Mild restricted diffusion in the medial right frontal lobe, rightinsula, and likely right hippocampus most consistent with recent seizure activity. Received additional amp of D50 due to BG 67 at the time of Hospitalist evaluation.Started on Keppra and EEG ordered.Neurology was consulted.Hospital course remarkable for severe constipation.she developed twitching of face muscles on 09/2319. She underwent LP by neurology suspecting for autoimmune/inflammatory disorder. Started on high-dose steroids. PT recommending SNF on DC   Assessment & Plan:   Active Problems:   Type 1 diabetes mellitus with other specified complication (HCC)   Seizure (HCC)   Hypoglycemia   Hypokalemia   Protein-calorie malnutrition, severe   Goals of care, counseling/discussion   Palliative care by specialist  New onset seizures, likely secondary to hypoglycemia EEG however showed epileptiform discharges.  Now on Keppra/clobazam per neurology recommendations.   Patient again had facial muscle twitching on  6/23.  She underwent LP,CSF IgG cell count, HSV, myelin basic protein, VDRL normal. CSF culture negative.  Neurology started her on IV Solu-Medrol for possible autoimmune, inflammatory disorder or paraneoplastic disorder.  They recommended IV Solu-Medrol 1 g daily for 5 doses-completed on 6/27.  Plan for neurology follow-up with Dr. Greggory Brandy in 6 weeks, possibly repeat MRI at that time.  Sinus tachycardia -Increase metoprolol to 50 mg twice daily  Type I diabetes mellitus, recurrent hypoglycemia  Patient reportedly was taking 70/30 insulin at 80 units every morning and 85 units every afternoon. Continue Levemir, insulin sliding scale and Accu-Cheks Adjust insulin as necessary Follows endocrinologist Dr. Honor Junes  Severe constipation/ stercoral Colitis  CT abdomen pelvis showed fecal impaction, bilateral hydroureteronephrosis due to distended rectum, approximately 6.3 cm sigmoid colonic wall thickening may represent focal colitis including stercoral colitis versus colonic neoplasm, large volume of stool throughout the remainder of colon.Extensive GI work-up outpatient with [facility] (Dr [name]), had upper endoscopy on 04/09/2019 for the above complaints, which showed chronic gastritis with H. pylori.No metaplasia or dysplasia or carcinoma.CEA, CA 19-9, normal.  She had a normal GE study on 04/17/2019 which showed no gastroparesis Inpatient colonoscopy on 7/4 revealing scattered diverticulosis, rectosigmoid ulcer likely stercoral.  Biopsies taken.  Internal hemorrhoids.  GI recommended high-fiber diet and to follow-up with them as outpatient. Aggressive bowel regimen  Hypothyroidism Follows with endocrinology, Dr. Dina Rich, continue Synthroid, Cytomel  Left upper lobe nodule, 3 mm incidental finding Repeat CT chest in 3 months, past smoker  Generalized debility, deconditioning- SNF placement  Severe protein calorie malnutrition:Estimated body mass index is 18.71 kg/m .    Seen by  dietitian  Deconditioning, goals of care Seen by palliative care  DVT prophylaxis: Lovenox Code Status: Full code  Family Communication: None at bedside  Status is: Inpatient  Dispo:  Patient From: [place]  Planned Disposition: [place]  Expected discharge date: 1 day  Medically stable for discharge: Yes, placement plan available     Body mass index is 18.71 kg/m.     Subjective: No complaints feels okay no acute events overnight  Review of Systems Otherwise negative except as per HPI, including: General: Denies fever, chills, night sweats or unintended weight loss. Resp: Denies cough, wheezing, shortness of breath. Cardiac: Denies chest pain, palpitations, orthopnea, paroxysmal nocturnal dyspnea. GI: Denies abdominal pain, nausea, vomiting, diarrhea or constipation GU: Denies dysuria, frequency, hesitancy or incontinence MS: Denies muscle aches, joint pain or swelling Neuro: Denies headache, neurologic deficits (focal weakness, numbness, tingling), abnormal gait Psych: Denies anxiety, depression, SI/HI/AVH Skin: Denies new rashes or lesions ID: Denies sick contacts, exotic exposures, travel Examination:  Constitutional: Chronically ill and frail appearing, sleeping this morning Respiratory: Clear to auscultation bilaterally Cardiovascular: Normal sinus rhythm, no rubs Abdomen: Nontender nondistended good bowel sounds Musculoskeletal: No edema noted Skin: No rashes seen Neurologic: CN 2-12 grossly intact.  And nonfocal Psychiatric: Poor judgment and insight. Alert and oriented x 3. Normal mood. Objective: Vitals:   11/08/19 2136 11/08/19 2358 11/09/19 0324 11/09/19 0808  BP: 112/63 102/62 (!) 101/58 (!) 104/49  Pulse: (!) 107 (!) 108 100 (!) 109  Resp: 17 16 16 16   Temp: 98.3 F (36.8 C) 98 F (36.7 C) 98.1 F (36.7 C) 98.4 F (36.9 C)  TempSrc: Oral Oral Oral Oral  SpO2: 100% 99% 98% 100%  Weight:        Intake/Output  Summary (Last 24 hours) at 11/09/2019 1028 Last data filed at 11/09/2019 0845 Gross per 24 hour  Intake 709 ml  Output 200 ml  Net 509 ml   Filed Weights   10/31/19 0309  Weight: 54.2 kg     Data Reviewed:   CBC: Recent Labs  Lab 11/04/19 0454 11/06/19 0413 11/07/19 0628 11/08/19 0442 11/09/19 0455  WBC 6.8 6.6 6.0 6.3 6.5  HGB 9.3* 8.8* 8.8* 8.7* 9.1*  HCT 28.6* 26.9* 27.1* 27.5* 28.0*  MCV 90.2 89.7 89.1 89.6 90.0  PLT 247 197 175 176 622   Basic Metabolic Panel: Recent Labs  Lab 11/04/19 0454 11/06/19 0413 11/07/19 0628 11/08/19 0442 11/09/19 0455  NA 139 139 139 137 139  K 3.8 3.3* 3.7 3.6 4.1  CL 103 106 108 104 106  CO2 27 25 23 25 25   GLUCOSE 203* 194* 221* 163* 197*  BUN 19 12 14 15 14   CREATININE 0.62 0.62 0.52 0.54 0.54  CALCIUM 8.7* 8.9 8.8* 8.9 9.0  MG  --  1.6* 1.8 1.8 1.7   GFR: Estimated Creatinine Clearance: 64.8 mL/min (by C-G formula based on SCr of 0.54 mg/dL). Liver Function Tests: No results for input(s): AST, ALT, ALKPHOS, BILITOT, PROT, ALBUMIN in the last 168 hours. No results for input(s): LIPASE, AMYLASE in the last 168 hours. No results for input(s): AMMONIA in the last 168 hours. Coagulation Profile: No results for input(s): INR, PROTIME in the last 168 hours. Cardiac Enzymes: No results for input(s): CKTOTAL, CKMB, CKMBINDEX, TROPONINI in the last 168 hours. BNP (last 3 results) No results for input(s): PROBNP in the last 8760 hours. HbA1C: No results for input(s): HGBA1C in the last 72 hours. CBG: Recent Labs  Lab 11/08/19 0627 11/08/19 1147 11/08/19 1601 11/08/19 2204 11/09/19 0605  GLUCAP 142* 158* 149* 323* 173*   Lipid  Profile: No results for input(s): CHOL, HDL, LDLCALC, TRIG, CHOLHDL, LDLDIRECT in the last 72 hours. Thyroid Function Tests: No results for input(s): TSH, T4TOTAL, FREET4, T3FREE, THYROIDAB in the last 72 hours. Anemia Panel: No results for input(s): VITAMINB12, FOLATE, FERRITIN, TIBC, IRON,  RETICCTPCT in the last 72 hours. Sepsis Labs: No results for input(s): PROCALCITON, LATICACIDVEN in the last 168 hours.  Recent Results (from the past 240 hour(s))  MRSA PCR Screening     Status: None   Collection Time: 11/02/19  5:30 AM   Specimen: Nasopharyngeal  Result Value Ref Range Status   MRSA by PCR NEGATIVE NEGATIVE Final    Comment:        The GeneXpert MRSA Assay (FDA approved for NASAL specimens only), is one component of a comprehensive MRSA colonization surveillance program. It is not intended to diagnose MRSA infection nor to guide or monitor treatment for MRSA infections. Performed at [facility], [place], [place] [address] [place], [place] [address]   SARS Coronavirus 2 by RT PCR (hospital order, performed in [facility] hospital lab) Nasopharyngeal Nasopharyngeal Swab     Status: None   Collection Time: 11/06/19 10:36 AM   Specimen: Nasopharyngeal Swab  Result Value Ref Range Status   SARS Coronavirus 2 NEGATIVE NEGATIVE Final    Comment: (NOTE) SARS-CoV-2 target nucleic acids are NOT DETECTED.  The SARS-CoV-2 RNA is generally detectable in upper and lower respiratory specimens during the acute phase of infection. The lowest concentration of SARS-CoV-2 viral copies this assay can detect is 250 copies / mL. A negative result does not preclude SARS-CoV-2 infection and should not be used as the sole basis for treatment or other patient management decisions.  A negative result may occur with improper specimen collection / handling, submission of specimen other than nasopharyngeal swab, presence of viral mutation(s) within the areas targeted by this assay, and inadequate number of viral copies (<250 copies / mL). A negative result must be combined with clinical observations, patient history, and epidemiological information.  Fact Sheet for Patients:   StrictlyIdeas.no  Fact Sheet for Healthcare  Providers: BankingDealers.co.za  This test is not yet approved or  cleared by the [place] FDA and has been authorized for detection and/or diagnosis of SARS-CoV-2 by FDA under an Emergency Use Authorization (EUA).  This EUA will remain in effect (meaning this test can be used) for the duration of the COVID-19 declaration under Section 564(b)(1) of the Act, 21 U.S.C. section 360bbb-3(b)(1), unless the authorization is terminated or revoked sooner.  Performed at [facility], [place] [address] [place], [place] [address]          Radiology Studies: No results found.      Scheduled Meds: . aspirin EC  81 mg Oral Daily  . bisacodyl  10 mg Rectal Once  . cholecalciferol  5,000 Units Oral Daily  . cloBAZam  5 mg Oral BID  . enoxaparin (LOVENOX) injection  40 mg Subcutaneous Q24H  . feeding supplement (ENSURE ENLIVE)  237 mL Oral TID BM  . feeding supplement (PRO-STAT SUGAR FREE 64)  30 mL Oral BID  . fesoterodine  4 mg Oral Daily  . insulin aspart  0-6 Units Subcutaneous TID WC  . insulin aspart  3 Units Subcutaneous TID WC  . insulin detemir  10 Units Subcutaneous BID  . levETIRAcetam  1,500 mg Oral BID  . levothyroxine  150 mcg Oral Q0600  . liothyronine  5 mcg Oral Daily  . metoprolol tartrate  50 mg Oral BID  . multivitamin with minerals  1 tablet Oral Daily  . psyllium  1 packet Oral BID  . rosuvastatin  20 mg Oral QHS  . senna-docusate  1 tablet Oral BID  . sodium chloride flush  3 mL Intravenous Once   Continuous Infusions:    LOS: 20 days   Time spent= 35 mins    Cipriana Biller Arsenio Loader, MD [facility]  If 7PM-7AM, please contact night-coverage  11/09/2019, 10:28 AM

## 2019-11-09 NOTE — Plan of Care (Signed)

## 2019-11-10 LAB — GLUCOSE, CAPILLARY
Glucose-Capillary: 181 mg/dL — ABNORMAL HIGH (ref 70–99)
Glucose-Capillary: 223 mg/dL — ABNORMAL HIGH (ref 70–99)
Glucose-Capillary: 143 mg/dL — ABNORMAL HIGH (ref 70–99)
Glucose-Capillary: 282 mg/dL — ABNORMAL HIGH (ref 70–99)

## 2019-11-10 LAB — CBC
HCT: 28.1 % — ABNORMAL LOW (ref 36.0–46.0)
Hemoglobin: 9.4 g/dL — ABNORMAL LOW (ref 12.0–15.0)
MCH: 29.7 pg (ref 26.0–34.0)
MCHC: 33.5 g/dL (ref 30.0–36.0)
MCV: 88.9 fL (ref 80.0–100.0)
Platelets: 180 10*3/uL (ref 150–400)
RBC: 3.16 MIL/uL — ABNORMAL LOW (ref 3.87–5.11)
RDW: 14.3 % (ref 11.5–15.5)
WBC: 8.3 10*3/uL (ref 4.0–10.5)
nRBC: 0 % (ref 0.0–0.2)

## 2019-11-10 LAB — BASIC METABOLIC PANEL
Anion gap: 10 (ref 5–15)
BUN: 16 mg/dL (ref 6–20)
CO2: 24 mmol/L (ref 22–32)
Calcium: 9.1 mg/dL (ref 8.9–10.3)
Chloride: 104 mmol/L (ref 98–111)
Creatinine, Ser: 0.65 mg/dL (ref 0.44–1.00)
GFR calc Af Amer: >60 mL/min (ref >60)
GFR calc non Af Amer: >60 mL/min (ref >60)
Glucose, Bld: 246 mg/dL — ABNORMAL HIGH (ref 70–99)
Potassium: 3.9 mmol/L (ref 3.5–5.1)
Sodium: 138 mmol/L (ref 135–145)

## 2019-11-10 LAB — MAGNESIUM: Magnesium: 2 mg/dL (ref 1.7–2.4)

## 2019-11-10 MED ORDER — SODIUM CHLORIDE 0.9 % IV BOLUS
500.0000 mL | Freq: Once | INTRAVENOUS | Status: AC
  Administered 2019-11-10: 500 mL via INTRAVENOUS

## 2019-11-10 NOTE — Progress Notes (Signed)
Nutrition Follow-up  DOCUMENTATION CODES:   Severe malnutrition in context of chronic illness  INTERVENTION:   Continue Ensure Enlive po TID, each supplement provides 350 kcal and 20 grams of protein  Continue MVI daily   NUTRITION DIAGNOSIS:   Severe Malnutrition related to chronic illness as evidenced by percent weight loss, severe muscle depletion, severe fat depletion.  Ongoing.  GOAL:   Patient will meet greater than or equal to 90% of their needs  Progressing.  MONITOR:   PO intake, Supplement acceptance, Labs, I & O's, Diet advancement, Weight trends  REASON FOR ASSESSMENT:   Consult Assessment of nutrition requirement/status  ASSESSMENT:   Pt presented with hypoglycemia, then had 2 focal seizures and was admitted. Hospital course remarkable for severe constipation, LP suspicious of autoimmune/inflammatory disorder, and an abnormal CT concerning for colonic neoplasm. PMH includes DM, HLD, hypothyroidism.  7/4 Colonoscopy revealing scattered diverticulosis, rectosigmoid ulcer likely stercoral, biopsies taken  Pt sleeping at time of RD visit.   Pt pending SNF placement.  PO Intake: 75-100% x last 8 recorded meals (92% average meal intake)  Labs: CBGs 181-249 (Diabetes Coordinator following) Medications: Vitamin D3, Ensure Enlive po TID, 65ml Pro-stat BID, Novolog, Levemir, MVI, Metamucil, Senokot-S  Diet Order:   Diet Order            Diet regular Room service appropriate? Yes; Fluid consistency: Thin  Diet effective now           Diet Carb Modified                 EDUCATION NEEDS:   No education needs have been identified at this time  Skin:  Skin Assessment: Reviewed RN Assessment  Last BM:  7/11  Height:   Ht Readings from Last 1 Encounters:  10/18/19 5\' 7"  (1.702 m)    Weight:   Wt Readings from Last 1 Encounters:  10/31/19 54.2 kg    BMI:  Body mass index is 18.71 kg/m.  Estimated Nutritional Needs:   Kcal:   1700-1900  Protein:  85-100 grams  Fluid:  >1.7L/d    [name], MS, RD, LDN RD pager number and weekend/on-call pager number located in [place].

## 2019-11-10 NOTE — Progress Notes (Signed)
Inpatient Diabetes Program Recommendations  AACE/ADA: New Consensus Statement on Inpatient Glycemic Control   Target Ranges:  Prepandial:   less than 140 mg/dL      Peak postprandial:   less than 180 mg/dL (1-2 hours)      Critically ill patients:  140 - 180 mg/dL   Results for AVREE, [id number] (MRN [id number]) as of 11/10/2019 11:27  Ref. Range 11/09/2019 06:05 11/09/2019 12:33 11/09/2019 15:19 11/09/2019 20:35 11/10/2019 06:20  Glucose-Capillary Latest Ref Range: 70 - 99 mg/dL 173 (H) 295 (H) 290 (H) 249 (H) 181 (H)   Review of Glycemic Control  Diabetes history: DM Outpatient Diabetes medications: 70/30 80 units QAM, 70/30 85 units QPM Current orders for Inpatient glycemic control: Levemir 10 units BID, Novolog 3 units TID with meal, Novolog 0-6 units TID with meals  Inpatient Diabetes Program Recommendations:    Insulin-Meal Coverage: Please consider increasing meal coverage to Novolog 5 units TID with meals if patient eats at least 50% of meals.  Insulin-Basal: Please consider increasing Levemir to 12 units BID.  Thanks, Barnie Alderman, RN, MSN, CDE Diabetes Coordinator Inpatient Diabetes Program [phone] (Team Pager from 8am to 5pm)

## 2019-11-10 NOTE — Progress Notes (Signed)
Pt's. BP was 80/64, rechecked 10 mins later and was 92/64. Received verbal order from Dr. Reesa Chew to administer 500 cc bolus of NS 0.9%. Will continue to monitor.

## 2019-11-10 NOTE — Plan of Care (Signed)
  Problem: Education: Goal: Knowledge of General Education information will improve Description Including pain rating scale, medication(s)/side effects and non-pharmacologic comfort measures Outcome: Progressing   Problem: Nutrition: Goal: Adequate nutrition will be maintained Outcome: Progressing   Problem: Pain Managment: Goal: General experience of comfort will improve Outcome: Progressing   

## 2019-11-10 NOTE — Progress Notes (Signed)
PROGRESS NOTE    Andrea [name]  POE:[id number] DOB: 09/05/1960 DOA: 10/19/2019 PCP: [name], MD   Brief Narrative:  59 year old female with history of insulin-dependent diabetes mellitus, hyperlipidemia, hypothyroidism who was brought to the emergency department for evaluation of hypoglycemia. She was evaluated in the ED on 6/19 for blood glucose of 24, hypoglycemia was corrected and she was discharged home .  On 6/20, family called EMS as patient's blood glucose was again low at 30 and was noted to have dysarthria with left-sided weakness. When EMS arrived, patient had 2 focal seizures and was given IV Versed along with glucose. On presentation she still had mild left-sided weakness and dysarthria. She takes 70/30 insulin at home, her last dose before presentation was 80 units on the morning of 6/19. She did report recent increase in insulin dosage.  CT head:No Acute abnormality.MRI without contrast: Mild restricted diffusion in the medial right frontal lobe, rightinsula, and likely right hippocampus most consistent with recent seizure activity. Received additional amp of D50 due to BG 67 at the time of Hospitalist evaluation.Started on Keppra and EEG ordered.Neurology was consulted.Hospital course remarkable for severe constipation.she developed twitching of face muscles on 09/2319. She underwent LP by neurology suspecting for autoimmune/inflammatory disorder. Started on high-dose steroids. PT recommending SNF on DC   Assessment & Plan:   Active Problems:   Type 1 diabetes mellitus with other specified complication (HCC)   Seizure (HCC)   Hypoglycemia   Hypokalemia   Protein-calorie malnutrition, severe   Goals of care, counseling/discussion   Palliative care by specialist  New onset seizures, likely secondary to hypoglycemia EEG however showed epileptiform discharges.  Now on Keppra/clobazam per neurology recommendations.   Patient again had facial muscle twitching on  6/23.  She underwent LP,CSF IgG cell count, HSV, myelin basic protein, VDRL normal. CSF culture negative.  Neurology started her on IV Solu-Medrol for possible autoimmune, inflammatory disorder or paraneoplastic disorder.  They recommended IV Solu-Medrol 1 g daily for 5 doses-completed on 6/27.  Plan for neurology follow-up with Dr. Greggory Brandy in 6 weeks, possibly repeat MRI at that time. Currently pending placement  Sinus tachycardia -Continue metoprolol 50 mg twice daily, uptitrate as needed  Type I diabetes mellitus, recurrent hypoglycemia  Patient reportedly was taking 70/30 insulin at 80 units every morning and 85 units every afternoon. Continue Levemir, insulin sliding scale and Accu-Cheks Adjust insulin as necessary Follows endocrinologist Dr. Honor Junes  Severe constipation/ stercoral Colitis  CT abdomen pelvis showed fecal impaction, bilateral hydroureteronephrosis due to distended rectum, approximately 6.3 cm sigmoid colonic wall thickening may represent focal colitis including stercoral colitis versus colonic neoplasm, large volume of stool throughout the remainder of colon.Extensive GI work-up outpatient with [facility] (Dr [name]), had upper endoscopy on 04/09/2019 for the above complaints, which showed chronic gastritis with H. pylori.No metaplasia or dysplasia or carcinoma.CEA, CA 19-9, normal.  She had a normal GE study on 04/17/2019 which showed no gastroparesis Inpatient colonoscopy on 7/4 revealing scattered diverticulosis, rectosigmoid ulcer likely stercoral.  Biopsies taken.  Internal hemorrhoids.  GI recommended high-fiber diet and to follow-up with them as outpatient. Aggressive bowel regimen  Hypothyroidism Follows with endocrinology, Dr. Dina Rich, continue Synthroid, Cytomel  Left upper lobe nodule, 3 mm incidental finding Repeat CT chest in 3 months, past smoker  Generalized debility, deconditioning- SNF placement  Severe protein calorie malnutrition:Estimated  body mass index is 18.71 kg/m .    Seen by dietitian  Deconditioning, goals of care Seen by palliative care  DVT prophylaxis:  Lovenox Code Status: Full code Family Communication: None at bedside  Status is: Inpatient  Dispo:  Patient From: [place]  Planned Disposition: [place]  Expected discharge date: 1 day  Medically stable for discharge: Yes, pending placement at this time.     Body mass index is 18.71 kg/m.     Subjective: Feels okay, no acute events overnight.  Review of Systems Otherwise negative except as per HPI, including: General: Denies fever, chills, night sweats or unintended weight loss. Resp: Denies cough, wheezing, shortness of breath. Cardiac: Denies chest pain, palpitations, orthopnea, paroxysmal nocturnal dyspnea. GI: Denies abdominal pain, nausea, vomiting, diarrhea or constipation GU: Denies dysuria, frequency, hesitancy or incontinence MS: Denies muscle aches, joint pain or swelling Neuro: Denies headache, neurologic deficits (focal weakness, numbness, tingling), abnormal gait Psych: Denies anxiety, depression, SI/HI/AVH Skin: Denies new rashes or lesions ID: Denies sick contacts, exotic exposures, travel Examination:  Constitutional: Chronically ill and frail appearing not in acute distress Respiratory: Clear to auscultation bilaterally Cardiovascular: Sinus tachycardia, normal sinus rhythm, no rubs Abdomen: Nontender nondistended good bowel sounds Musculoskeletal: No edema noted Skin: No rashes seen Neurologic: CN 2-12 grossly intact.  And nonfocal Psychiatric: Poor judgment and insight. Alert and oriented x 3. Normal mood. Objective: Vitals:   11/09/19 2001 11/10/19 0014 11/10/19 0300 11/10/19 0918  BP: (!) 108/58 (!) 105/52 106/61 92/64  Pulse:  (!) 106 (!) 101 (!) 102  Resp: 16 16 20 18   Temp: 99.5 F (37.5 C) 98.8 F (37.1 C) 98.1 F (36.7 C) 98 F (36.7 C)  TempSrc: Oral Oral Oral Oral    SpO2: 100% 99% 98% 100%  Weight:        Intake/Output Summary (Last 24 hours) at 11/10/2019 0956 Last data filed at 11/09/2019 1500 Gross per 24 hour  Intake 677 ml  Output --  Net 677 ml   Filed Weights   10/31/19 0309  Weight: 54.2 kg     Data Reviewed:   CBC: Recent Labs  Lab 11/06/19 0413 11/07/19 0628 11/08/19 0442 11/09/19 0455 11/10/19 0500  WBC 6.6 6.0 6.3 6.5 8.3  HGB 8.8* 8.8* 8.7* 9.1* 9.4*  HCT 26.9* 27.1* 27.5* 28.0* 28.1*  MCV 89.7 89.1 89.6 90.0 88.9  PLT 197 175 176 162 235   Basic Metabolic Panel: Recent Labs  Lab 11/06/19 0413 11/07/19 0628 11/08/19 0442 11/09/19 0455 11/10/19 0500  NA 139 139 137 139 138  K 3.3* 3.7 3.6 4.1 3.9  CL 106 108 104 106 104  CO2 25 23 25 25 24   GLUCOSE 194* 221* 163* 197* 246*  BUN 12 14 15 14 16   CREATININE 0.62 0.52 0.54 0.54 0.65  CALCIUM 8.9 8.8* 8.9 9.0 9.1  MG 1.6* 1.8 1.8 1.7 2.0   GFR: Estimated Creatinine Clearance: 64.8 mL/min (by C-G formula based on SCr of 0.65 mg/dL). Liver Function Tests: No results for input(s): AST, ALT, ALKPHOS, BILITOT, PROT, ALBUMIN in the last 168 hours. No results for input(s): LIPASE, AMYLASE in the last 168 hours. No results for input(s): AMMONIA in the last 168 hours. Coagulation Profile: No results for input(s): INR, PROTIME in the last 168 hours. Cardiac Enzymes: No results for input(s): CKTOTAL, CKMB, CKMBINDEX, TROPONINI in the last 168 hours. BNP (last 3 results) No results for input(s): PROBNP in the last 8760 hours. HbA1C: No results for input(s): HGBA1C in the last 72 hours. CBG: Recent Labs  Lab 11/09/19 0605 11/09/19 1233 11/09/19 1519 11/09/19 2035 11/10/19 0620  GLUCAP 173* 295*  290* 249* 181*   Lipid Profile: No results for input(s): CHOL, HDL, LDLCALC, TRIG, CHOLHDL, LDLDIRECT in the last 72 hours. Thyroid Function Tests: No results for input(s): TSH, T4TOTAL, FREET4, T3FREE, THYROIDAB in the last 72 hours. Anemia Panel: No results for  input(s): VITAMINB12, FOLATE, FERRITIN, TIBC, IRON, RETICCTPCT in the last 72 hours. Sepsis Labs: No results for input(s): PROCALCITON, LATICACIDVEN in the last 168 hours.  Recent Results (from the past 240 hour(s))  MRSA PCR Screening     Status: None   Collection Time: 11/02/19  5:30 AM   Specimen: Nasopharyngeal  Result Value Ref Range Status   MRSA by PCR NEGATIVE NEGATIVE Final    Comment:        The GeneXpert MRSA Assay (FDA approved for NASAL specimens only), is one component of a comprehensive MRSA colonization surveillance program. It is not intended to diagnose MRSA infection nor to guide or monitor treatment for MRSA infections. Performed at [facility], [place] [address] [place], [place] [address]   SARS Coronavirus 2 by RT PCR (hospital order, performed in [facility] hospital lab) Nasopharyngeal Nasopharyngeal Swab     Status: None   Collection Time: 11/06/19 10:36 AM   Specimen: Nasopharyngeal Swab  Result Value Ref Range Status   SARS Coronavirus 2 NEGATIVE NEGATIVE Final    Comment: (NOTE) SARS-CoV-2 target nucleic acids are NOT DETECTED.  The SARS-CoV-2 RNA is generally detectable in upper and lower respiratory specimens during the acute phase of infection. The lowest concentration of SARS-CoV-2 viral copies this assay can detect is 250 copies / mL. A negative result does not preclude SARS-CoV-2 infection and should not be used as the sole basis for treatment or other patient management decisions.  A negative result may occur with improper specimen collection / handling, submission of specimen other than nasopharyngeal swab, presence of viral mutation(s) within the areas targeted by this assay, and inadequate number of viral copies (<250 copies / mL). A negative result must be combined with clinical observations, patient history, and epidemiological information.  Fact Sheet for Patients:   StrictlyIdeas.no  Fact Sheet for  Healthcare Providers: BankingDealers.co.za  This test is not yet approved or  cleared by the [place] FDA and has been authorized for detection and/or diagnosis of SARS-CoV-2 by FDA under an Emergency Use Authorization (EUA).  This EUA will remain in effect (meaning this test can be used) for the duration of the COVID-19 declaration under Section 564(b)(1) of the Act, 21 U.S.C. section 360bbb-3(b)(1), unless the authorization is terminated or revoked sooner.  Performed at [facility], [place] [address], [place], [place] [address]          Radiology Studies: No results found.      Scheduled Meds: . aspirin EC  81 mg Oral Daily  . cholecalciferol  5,000 Units Oral Daily  . cloBAZam  5 mg Oral BID  . enoxaparin (LOVENOX) injection  40 mg Subcutaneous Q24H  . feeding supplement (ENSURE ENLIVE)  237 mL Oral TID BM  . feeding supplement (PRO-STAT SUGAR FREE 64)  30 mL Oral BID  . fesoterodine  4 mg Oral Daily  . insulin aspart  0-6 Units Subcutaneous TID WC  . insulin aspart  3 Units Subcutaneous TID WC  . insulin detemir  10 Units Subcutaneous BID  . levETIRAcetam  1,500 mg Oral BID  . levothyroxine  150 mcg Oral Q0600  . liothyronine  5 mcg Oral Daily  . metoprolol tartrate  50 mg  Oral BID  . multivitamin with minerals  1 tablet Oral Daily  . psyllium  1 packet Oral BID  . rosuvastatin  20 mg Oral QHS  . senna-docusate  1 tablet Oral BID  . sodium chloride flush  3 mL Intravenous Once   Continuous Infusions: . sodium chloride       LOS: 21 days   Time spent= 15 mins    Stefanie Hodgens Arsenio Loader, MD [facility]  If 7PM-7AM, please contact night-coverage  11/10/2019, 9:56 AM

## 2019-11-11 LAB — GLUCOSE, CAPILLARY
Glucose-Capillary: 152 mg/dL — ABNORMAL HIGH (ref 70–99)
Glucose-Capillary: 177 mg/dL — ABNORMAL HIGH (ref 70–99)
Glucose-Capillary: 291 mg/dL — ABNORMAL HIGH (ref 70–99)
Glucose-Capillary: 181 mg/dL — ABNORMAL HIGH (ref 70–99)
Glucose-Capillary: 235 mg/dL — ABNORMAL HIGH (ref 70–99)

## 2019-11-11 NOTE — Progress Notes (Signed)
Medically stable, no complaints this morning.  No acute events overnight.  Vital signs remained stable.  Physical exam remains unchanged, not in acute distress, clear to auscultation bilaterally.  Normal sinus rhythm heart rate is better controlled now.  Abdomen is nontender nondistended.  Discharge summary completed on 7/8, updated today.  TOC and RN aware of patient's plan.  Stable for discharge.  Call if necessary  Gerlean Ren MD [facility]

## 2019-11-11 NOTE — Progress Notes (Signed)
PT Cancellation Note  Patient Details Name: Andrea [name] MRN: [id number] DOB: 29-Feb-1961   Cancelled Treatment:    Reason Eval/Treat Not Completed: Pain limiting ability to participate  RN notified and to give pt pain meds. IF able, will return after meds have taken effect. If unable to see today, will coordinate with pain medication 11/12/19   Arby Barrette, PT Pager [phone]   Rexanne Mano 11/11/2019, 3:47 PM

## 2019-11-11 NOTE — Progress Notes (Signed)
Inpatient Diabetes Program Recommendations  AACE/ADA: New Consensus Statement on Inpatient Glycemic Control   Target Ranges:  Prepandial:   less than 140 mg/dL      Peak postprandial:   less than 180 mg/dL (1-2 hours)      Critically ill patients:  140 - 180 mg/dL   Results for Andrea [name], Andrea [name] (MRN [id number]) as of 11/11/2019 09:52  Ref. Range 11/10/2019 06:20 11/10/2019 11:48 11/10/2019 17:29 11/10/2019 21:45 11/11/2019 06:20 11/11/2019 07:29  Glucose-Capillary Latest Ref Range: 70 - 99 mg/dL 181 (H) 223 (H) 143 (H) 282 (H) 152 (H) 177 (H)   Review of Glycemic Control  Diabetes history: DM Outpatient Diabetes medications: 70/30 80 units QAM, 70/30 85 units QPM Current orders for Inpatient glycemic control: Levemir 10 units BID, Novolog 3 units TID with meal, Novolog 0-6 units TID with meals  Inpatient Diabetes Program Recommendations:    Insulin-Meal Coverage: Please consider increasing meal coverage to Novolog 5 units TID with meals if patient eats at least 50% of meals.  Thanks, Barnie Alderman, RN, MSN, CDE Diabetes Coordinator Inpatient Diabetes Program [phone] (Team Pager from 8am to 5pm)

## 2019-11-11 NOTE — Plan of Care (Signed)
  Problem: Education: Goal: Knowledge of General Education information will improve Description Including pain rating scale, medication(s)/side effects and non-pharmacologic comfort measures Outcome: Progressing   Problem: Nutrition: Goal: Adequate nutrition will be maintained Outcome: Progressing   Problem: Pain Managment: Goal: General experience of comfort will improve Outcome: Progressing   

## 2019-11-11 NOTE — Progress Notes (Signed)
Occupational Therapy Treatment Patient Details Name: Andrea [name] MRN: [id number] DOB: 1960/12/15 Today's Date: 11/11/2019    History of present illness 59 y.o. female with past medical history significant for diabetes insulin-dependent, hyperlipidemia, hypothyroidism, who was just evaluated at [facility], ER on 6/19 for hypoglycemia of 24, patient was initially nonverbal with generalized weakness at that time.  Hypoglycemia was corrected and she was discharged home.  Today family called EMS because patient's blood sugar was again low at 30.  Patient was also noted to have  left-sided weakness.  Patient was last seen normal at 2 AM.  When EMS was on the scene patient had 2 focal seizures.  Patient received IV Versed and glucose.  On arrival to the ED patient had still mild left-sided weakness and dysarthria. Pt underwent colonscopy on 7/4.Started on Keppra and EEG ordered. Neurology was consulted.Hospital course remarkable for severe constipation.she developed twitching of face muscles on 09/2319.  She underwent LP by neurology suspecting for autoimmune/inflammatory disorder.  Started on high-dose steroids and completed course in the hospital.    OT comments  Pt continues to be slow to process and presents with some memory deficits. Pt reports back pain, but agreeable to OOB mobility. Overall, pt requires gross supervision for functional mobility, standing grooming tasks and toileting with RW. DC plan remains appropriate, will follow acutely per POC.   Follow Up Recommendations  SNF;Supervision/Assistance - 24 hour    Equipment Recommendations  3 in 1 bedside commode    Recommendations for Other Services      Precautions / Restrictions Precautions Precautions: Fall Restrictions Weight Bearing Restrictions: No       Mobility Bed Mobility Overal bed mobility: Modified Independent Bed Mobility: Supine to Sit;Sit to Supine     Supine to sit: Modified independent (Device/Increase time) Sit  to supine: Modified independent (Device/Increase time)   General bed mobility comments: no physical assist needed, use of rail and elevated HOB  Transfers Overall transfer level: Needs assistance Equipment used: Rolling walker (2 wheeled) Transfers: Sit to/from Stand Sit to Stand: Supervision         General transfer comment: no physical assist needed for sit<>stand from EOB or toilet    Balance Overall balance assessment: Needs assistance Sitting-balance support: No upper extremity supported;Feet supported Sitting balance-Leahy Scale: Good Sitting balance - Comments: close supervision    Standing balance support: No upper extremity supported;During functional activity Standing balance-Leahy Scale: Fair Standing balance comment: pericare                           ADL either performed or assessed with clinical judgement   ADL Overall ADL's : Needs assistance/impaired     Grooming: Wash/dry hands;Wash/dry face;Standing;Supervision/safety Grooming Details (indicate cue type and reason): standing at sink with RW and gross supervision             Lower Body Dressing: Supervision/safety;Sit to/from stand Lower Body Dressing Details (indicate cue type and reason): sit<>stand to don brief with supervision; Toilet Transfer: Ambulation;RW;Grab Financial controller Details (indicate cue type and reason): supervision for safety Toileting- Clothing Manipulation and Hygiene: Supervision/safety;Sit to/from stand Toileting - Clothing Manipulation Details (indicate cue type and reason): able to complete anterior pericare via sit<>stand with wash cloth with supervision for balance     Functional mobility during ADLs: Supervision/safety;Rolling walker General ADL Comments: able to complete full ADL at sink and toileting. continues to be slow to processs     Vision  Perception     Praxis      Cognition Arousal/Alertness: Awake/alert;Lethargic  (lethargic at start of sesssion) Behavior During Therapy: Flat affect Overall Cognitive Status: Impaired/Different from baseline Area of Impairment: Memory;Attention;Awareness;Problem solving;Orientation                 Orientation Level: Disoriented to;Time Current Attention Level: Selective Memory: Decreased short-term memory     Awareness: Emergent Problem Solving: Slow processing General Comments: very slow to process, disoriented to day but able to state correct month. decreased short term memory noted with pt unable to state correct day of week previously stated during session        Exercises     Shoulder Instructions       General Comments      Pertinent Vitals/ Pain       Pain Assessment: Faces Faces Pain Scale: Hurts little more Pain Location: back Pain Descriptors / Indicators: Discomfort Pain Intervention(s): Monitored during session  Home Living                                          Prior Functioning/Environment              Frequency  Min 2X/week        Progress Toward Goals  OT Goals(current goals can now be found in the care plan section)  Progress towards OT goals: Progressing toward goals  Acute Rehab OT Goals Patient Stated Goal: to brush her teeth OT Goal Formulation: With patient Time For Goal Achievement: 11/21/19 Potential to Achieve Goals: Good  Plan Discharge plan remains appropriate;Frequency remains appropriate    Co-evaluation                 AM-PAC OT "6 Clicks" Daily Activity     Outcome Measure   Help from another person eating meals?: None Help from another person taking care of personal grooming?: None Help from another person toileting, which includes using toliet, bedpan, or urinal?: A Little Help from another person bathing (including washing, rinsing, drying)?: A Little Help from another person to put on and taking off regular upper body clothing?: None Help from another  person to put on and taking off regular lower body clothing?: None 6 Click Score: 22    End of Session Equipment Utilized During Treatment: Rolling walker  OT Visit Diagnosis: Unsteadiness on feet (R26.81);Other symptoms and signs involving cognitive function;Muscle weakness (generalized) (M62.81)   Activity Tolerance Patient tolerated treatment well   Patient Left in bed;with call bell/phone within reach;with bed alarm set   Nurse Communication Mobility status        Time: 6720-9470 OT Time Calculation (min): 15 min  Charges: OT General Charges $OT Visit: 1 Visit OT Treatments $Self Care/Home Management : 8-22 mins  Lanier Clam., COTA/L Acute Rehabilitation Services [phone]    Ihor Gully 11/11/2019, 4:31 PM

## 2019-11-12 LAB — GLUCOSE, CAPILLARY
Glucose-Capillary: 145 mg/dL — ABNORMAL HIGH (ref 70–99)
Glucose-Capillary: 212 mg/dL — ABNORMAL HIGH (ref 70–99)
Glucose-Capillary: 177 mg/dL — ABNORMAL HIGH (ref 70–99)

## 2019-11-12 LAB — SARS CORONAVIRUS 2 BY RT PCR (HOSPITAL ORDER, PERFORMED IN ~~LOC~~ HOSPITAL LAB): SARS Coronavirus 2: NEGATIVE

## 2019-11-12 NOTE — TOC Transition Note (Signed)
Transition of Care [facility]) - CM/SW Discharge Note   Patient Details  Name: Andrea [name] MRN: [id number] Date of Birth: 1960-12-15  Transition of Care [facility]) CM/SW Contact:  Geralynn Ochs, LCSW Phone Number: 11/12/2019, 2:18 PM   Clinical Narrative:   Nurse to call report to [phone].    Final next level of care: Skilled Nursing Facility Barriers to Discharge: Barriers Resolved   Patient Goals and CMS Choice Patient states their goals for this hospitalization and ongoing recovery are:: get rehab CMS Medicare.gov Compare Post Acute Care list provided to:: Patient Choice offered to / list presented to : Patient  Discharge Placement              Patient chooses bed at: [facility] Patient to be transferred to facility by: [place] Name of family member notified: Gwen Patient and family notified of of transfer: 11/12/19  Discharge Plan and Services     Post Acute Care Choice: [facility]                               Social Determinants of Health (SDOH) Interventions     Readmission Risk Interventions No flowsheet data found.

## 2019-11-12 NOTE — Progress Notes (Signed)
Report called to [place] at [facility]. Await PTAR.

## 2019-11-12 NOTE — Progress Notes (Signed)
Physical Therapy Treatment Patient Details Name: Andrea [name] MRN: [id number] DOB: 16-Sep-1960 Today's Date: 11/12/2019    History of Present Illness 59 y.o. female with past medical history significant for diabetes insulin-dependent, hyperlipidemia, hypothyroidism, who was just evaluated at [facility], ER on 6/19 for hypoglycemia of 24, patient was initially nonverbal with generalized weakness at that time.  Hypoglycemia was corrected and she was discharged home.  Today family called EMS because patient's blood sugar was again low at 30.  Patient was also noted to have  left-sided weakness.  Patient was last seen normal at 2 AM.  When EMS was on the scene patient had 2 focal seizures.  Patient received IV Versed and glucose.  On arrival to the ED patient had still mild left-sided weakness and dysarthria. Pt underwent colonscopy on 7/4.Started on Keppra and EEG ordered. Neurology was consulted.Hospital course remarkable for severe constipation.she developed twitching of face muscles on 09/2319.  She underwent LP by neurology suspecting for autoimmune/inflammatory disorder.  Started on high-dose steroids and completed course in the hospital.     PT Comments    Patient continues with slow, guarded movements and requires vc for safe use of RW. She was incontinent of bowels x 3 during session and worked on standing tolerance and balance while she performed pericare in standing each time. HR max during standing 118 bpm. Tolerated incr walking distance (180 ft) with HR max 120. Unable to complete stair training due to incr time with bowel issues.    Follow Up Recommendations  Supervision/Assistance - 24 hour;SNF     Equipment Recommendations  Rolling walker with 5" wheels;3in1 (PT)    Recommendations for Other Services       Precautions / Restrictions Precautions Precautions: Fall Precaution Comments: seizure; watch HR    Mobility  Bed Mobility Overal bed mobility: Modified Independent Bed  Mobility: Supine to Sit     Supine to sit: Modified independent (Device/Increase time)     General bed mobility comments: no physical assist needed, use of rail with HOB flat  Transfers Overall transfer level: Needs assistance Equipment used: Rolling walker (2 wheeled) Transfers: Sit to/from Stand Sit to Stand: Min guard         General transfer comment: vc with each transfer (x5) for proper hand placement/safe use of RW; as approaching the chair, she attempted to "park" RW 4 feet from chair and cues to keep RW with her until aligned and ready to sit  Ambulation/Gait Ambulation/Gait assistance: Supervision Gait Distance (Feet): 180 Feet Assistive device: Rolling walker (2 wheeled) Gait Pattern/deviations: Step-through pattern;Decreased stride length;Narrow base of support Gait velocity: reduced   General Gait Details: Slow gait, with cues to stay within frame of RW (x4); max HR 120   Stairs             Wheelchair Mobility    Modified Rankin (Stroke Patients Only)       Balance Overall balance assessment: Needs assistance Sitting-balance support: No upper extremity supported;Feet supported Sitting balance-Leahy Scale: Good     Standing balance support: No upper extremity supported;During functional activity Standing balance-Leahy Scale: Fair Standing balance comment: close guarding during standing pericare (pt with repeated incontinence of bowels with total standing time ~10 minutes; seated rest x 2)                            Cognition Arousal/Alertness: Awake/alert Behavior During Therapy: Flat affect Overall Cognitive Status: Impaired/Different from baseline Area of  Impairment: Awareness;Safety/judgement;Orientation;Attention                 Orientation Level: Time;Disoriented to Current Attention Level: Selective     Safety/Judgement: Decreased awareness of safety;Decreased awareness of deficits Awareness: Emergent           Exercises      General Comments General comments (skin integrity, edema, etc.): Pt reports incontinence of bowels PTA and always wears a pad      Pertinent Vitals/Pain Pain Assessment: No/denies pain    Home Living                      Prior Function            PT Goals (current goals can now be found in the care plan section) Acute Rehab PT Goals Patient Stated Goal: to get strong enough to go home PT Goal Formulation: With patient Time For Goal Achievement: 11/26/19 Potential to Achieve Goals: Good Progress towards PT goals: Progressing toward goals;Goals met and updated - see care plan (goals1, 3 met; others approp with timeframe updated)    Frequency    Min 2X/week      PT Plan Current plan remains appropriate    Co-evaluation              AM-PAC PT "6 Clicks" Mobility   Outcome Measure  Help needed turning from your back to your side while in a flat bed without using bedrails?: None Help needed moving from lying on your back to sitting on the side of a flat bed without using bedrails?: None Help needed moving to and from a bed to a chair (including a wheelchair)?: A Little Help needed standing up from a chair using your arms (e.g., wheelchair or bedside chair)?: A Little Help needed to walk in hospital room?: A Little Help needed climbing 3-5 steps with a railing? : A Little 6 Click Score: 20    End of Session Equipment Utilized During Treatment:  (pt refused gait belt) Activity Tolerance: Patient tolerated treatment well Patient left: with call bell/phone within reach;in chair;with chair alarm set Nurse Communication: Mobility status PT Visit Diagnosis: Unsteadiness on feet (R26.81);Other abnormalities of gait and mobility (R26.89);Muscle weakness (generalized) (M62.81);Other symptoms and signs involving the nervous system (R29.898)     Time: 5996-8957 PT Time Calculation (min) (ACUTE ONLY): 37 min  Charges:  $Gait Training: 8-22  mins $Therapeutic Activity: 8-22 mins                      Arby Barrette, PT Pager [phone]    Rexanne Mano 11/12/2019, 10:35 AM

## 2019-11-12 NOTE — Discharge Summary (Signed)
Physician Discharge Summary  Andrea [name] RWE:[id number] DOB: 1961/01/24 DOA: 10/19/2019  PCP: [name], MD  Admit date: 10/19/2019 Discharge date: 11/12/2019  Admitted From: Home Disposition: SNF  Recommendations for Outpatient Follow-up:  1. Follow up with PCP in 1-2 weeks 2. Please obtain BMP/CBC in one week at your next doctors visit.  3. Follow-up outpatient Endocrinology in 2-3 weeks 4. Outpatient repeat CT chest without contrast for left upper lobe lung nodule in 3 months 5. Need daily aggressive bowel regimen to ensure she has 1-2 soft bowel movements daily 6. Daily Metamucil 7. Follow-up outpatient neurology in 4 to 6 weeks 8. Metoprolol 50 mg twice daily started 9. Home 70/30 insulin discontinued due to risk of hypoglycemia.  No started on Levemir 10 units twice daily, insulin sliding scale and NovoLog 3 units premeals.  Adjust as necessary. 10. Daily Keppra and clobazam for seizures  Discharge Condition: Stable CODE STATUS: Full code Diet recommendation: Diabetic  Brief/Interim Summary: 59 year old female with history of insulin-dependent diabetes mellitus, hyperlipidemia, hypothyroidism who was brought to the emergency department for evaluation of hypoglycemia. She was evaluated in the ED on 6/19 for blood glucose of 24, hypoglycemia was corrected and she was discharged home . On 6/20, family called EMS as patient's blood glucose was again low at 30 and was noted to have dysarthria with left-sided weakness. When EMS arrived, patient had 2 focal seizures and was given IV Versed along with glucose. On presentation she still had mild left-sided weakness and dysarthria. She takes 70/30 insulin at home, her last dose before presentation was 80 units on the morning of 6/19. She did report recent increase in insulin dosage. CT head:No Acute abnormality.MRI without contrast: Mild restricted diffusion in the medial right frontal lobe, rightinsula, and likely right  hippocampus most consistent with recent seizure activity. Received additional amp of D50 due to BG 67 at the time of Hospitalist evaluation.Started on Keppra and EEG ordered.Neurology was consulted.Hospital course remarkable for severe constipation.she developed twitching of face muscles on 09/2319. She underwent LP by neurology suspecting for autoimmune/inflammatory disorder. Started on high-dose steroids and completed course in the hospital. PT recommending SNF on DC.  Arrangements were made by [facility] team.   Assessment & Plan:    New onset seizures, likely secondary to hypoglycemia EEG however showed epileptiform discharges. Now on Keppra/clobazam per neurology recommendations.  Patient again had facial muscle twitching on 6/23. She underwent LP,CSF IgG cell count, HSV, myelin basic protein, VDRL normal. CSF culture negative. Neurology started her on IV Solu-Medrol for possible autoimmune, inflammatory disorder or paraneoplastic disorder. They recommended IV Solu-Medrol 1 g daily for 5 doses-completed on 6/27.  Plan for neurology follow-up with Dr. Greggory Brandy in 6 weeks, possibly repeat MRI at that time.  Sinus tachycardia Improved with metoprolol 50mg  po bid  Type I diabetes mellitus, recurrent hypoglycemia Patient reportedly was taking 70/30 insulin at 80 units every morning and 85 units every afternoon.  Now discontinued Currently on Levemir 10 units twice daily, NovoLog 3 units premeals 3 times daily and sliding scale. Adjust insulin as necessary Follows endocrinologist Dr. Honor Junes  Severe constipation/ stercoral Colitis  CT abdomen pelvis showed fecal impaction, bilateral hydroureteronephrosis due to distended rectum, approximately 6.3 cm sigmoid colonic wall thickening may represent focal colitis including stercoral colitis versus colonic neoplasm, large volume of stool throughout the remainder of colon.Extensive GI work-up outpatient with [facility] (Dr [name]), had upper  endoscopy on 04/09/2019 for the above complaints, which showed chronic gastritis with H. pylori.No metaplasia  or dysplasia or carcinoma.CEA, CA 19-9, normal. She had a normal GE study on 04/17/2019 which showed no gastroparesis Inpatient colonoscopy on 7/4 revealing scattered diverticulosis, rectosigmoid ulcer likely stercoral. Biopsies taken. Internal hemorrhoids. GI recommended high-fiber diet and to follow-up with them as outpatient. Aggressive bowel regimen-she needs to have 1-2 soft bowel movements daily  Hypothyroidism Follows with endocrinology, Dr. Dina Rich, continue Synthroid, Cytomel  Left upper lobe nodule, 3 mm incidental finding Repeat CT chest in 3 months, past smoker  Generalized debility, deconditioning- SNF placement  Severe protein calorie malnutrition:Estimated body mass index is 18.71 kg/m .   Seen by dietitian  Deconditioning, goals of care Seen by palliative care  Body mass index is 18.71 kg/m.    Discharge Diagnoses:  Active Problems:   Type 1 diabetes mellitus with other specified complication (HCC)   Seizure (HCC)   Hypoglycemia   Hypokalemia   Protein-calorie malnutrition, severe   Goals of care, counseling/discussion   Palliative care by specialist   Consultations:  Neurology  GI  Subjective: Feels okay no complaints  Discharge Exam: Vitals:   11/12/19 0448 11/12/19 0820  BP: 111/70 115/62  Pulse: 92 100  Resp: 16 18  Temp: 97.7 F (36.5 C) 98.3 F (36.8 C)  SpO2: 100% 100%   Vitals:   11/11/19 2120 11/12/19 0104 11/12/19 0448 11/12/19 0820  BP: (!) 106/57 110/63 111/70 115/62  Pulse: 99 94 92 100  Resp:  16 16 18   Temp:  98.6 F (37 C) 97.7 F (36.5 C) 98.3 F (36.8 C)  TempSrc:  Oral Oral Oral  SpO2:  100% 100% 100%  Weight:        General: alert, awake, not in acute distress Cardiovascular: Sinus tachycardia, RRR Respiratory: CTA bilaterally, no wheezing, no rhonchi Abdominal: Soft, NT/ND, bowel sounds  + Extremities: no edema, no cyanosis  Discharge Instructions  Discharge Instructions    Ambulatory referral to Neurology   Complete by: As directed    An appointment is requested in approximately:2-3 Week(s): new seizures   Diet Carb Modified   Complete by: As directed    Discharge instructions   Complete by: As directed    It is VERY IMPORTANT that you follow up with a PCP on a regular basis.  Check your blood glucoses before each meal and at bedtime and maintain a log of your readings.  Bring this log with you when you follow up with your PCP so that he or she can adjust your insulin at your follow up visit.   Increase activity slowly   Complete by: As directed      Allergies as of 11/12/2019      Reactions   Latex Hives      Medication List    STOP taking these medications   insulin NPH-regular Human (70-30) 100 UNIT/ML injection     TAKE these medications   aspirin EC 81 MG tablet Take 81 mg by mouth daily.   Cholecalciferol 125 MCG (5000 UT) Tabs Take 5,000 Units by mouth daily.   cloBAZam 10 MG tablet Commonly known as: ONFI Take 0.5 tablets (5 mg total) by mouth 2 (two) times daily. Refill by PCP or neurology   feeding supplement (ENSURE ENLIVE) Liqd Take 237 mLs by mouth 3 (three) times daily between meals.   insulin aspart 100 UNIT/ML injection Commonly known as: novoLOG Inject 0-6 Units into the skin 3 (three) times daily with meals.   insulin aspart 100 UNIT/ML injection Commonly known as: novoLOG Inject 3 Units  into the skin 3 (three) times daily with meals.   insulin detemir 100 UNIT/ML injection Commonly known as: LEVEMIR Inject 0.1 mLs (10 Units total) into the skin 2 (two) times daily.   levETIRAcetam 750 MG tablet Commonly known as: KEPPRA Take 2 tablets (1,500 mg total) by mouth 2 (two) times daily.   levonorgestrel 20 MCG/24HR IUD Commonly known as: MIRENA 1 each by Intrauterine route once.   levothyroxine 150 MCG tablet Commonly known  as: SYNTHROID Take 150 mcg by mouth every morning.   liothyronine 5 MCG tablet Commonly known as: CYTOMEL Take 5 mcg by mouth daily. Take in addition to levothyroxine.   lisinopril 20 MG tablet Commonly known as: ZESTRIL Take 20 mg by mouth daily.   metoprolol tartrate 50 MG tablet Commonly known as: LOPRESSOR Take 1 tablet (50 mg total) by mouth 2 (two) times daily.   multivitamin with minerals Tabs tablet Take 1 tablet by mouth daily.   polyethylene glycol 17 g packet Commonly known as: MIRALAX / GLYCOLAX Take 17 g by mouth daily as needed for moderate constipation.   potassium chloride SA 20 MEQ tablet Commonly known as: KLOR-CON Take 1 tablet (20 mEq total) by mouth daily. What changed: when to take this   psyllium 95 % Pack Commonly known as: HYDROCIL/METAMUCIL Take 1 packet by mouth 2 (two) times daily.   rosuvastatin 20 MG tablet Commonly known as: CRESTOR Take 20 mg by mouth at bedtime.   senna-docusate 8.6-50 MG tablet Commonly known as: Senokot S Take 2 tablets by mouth at bedtime.   tolterodine 4 MG 24 hr capsule Commonly known as: Detrol LA Take 1 capsule (4 mg total) by mouth daily.            Durable Medical Equipment  (From admission, onward)         Start     Ordered   10/27/19 0837  For home use only DME 3 n 1  Once        10/27/19 0836   10/27/19 0837  For home use only DME Walker rolling  Once       Question Answer Comment  Walker: With 5 Inch Wheels   Patient needs a walker to treat with the following condition Gait instability      10/27/19 0836          Follow-up Information    Bright, Aaron Mose, MD. Schedule an appointment as soon as possible for a visit in 1 week(s).   Specialty: Internal Medicine Contact information: [place] [address] [place]. Schedule an appointment as soon as possible for a visit in 2 week(s).   Contact information: [address]     [address] [place] [address] [phone]             Allergies  Allergen Reactions  . Latex Hives    You were cared for by a hospitalist during your hospital stay. If you have any questions about your discharge medications or the care you received while you were in the hospital after you are discharged, you can call the unit and asked to speak with the hospitalist on call if the hospitalist that took care of you is not available. Once you are discharged, your primary care physician will handle any further medical issues. Please note that no refills for any discharge medications will be authorized once you are discharged, as it is imperative  that you return to your primary care physician (or establish a relationship with a primary care physician if you do not have one) for your aftercare needs so that they can reassess your need for medications and monitor your lab values.  Basic Metabolic Panel: Recent Labs  Lab 11/06/19 0413 11/07/19 0628 11/08/19 0442 11/09/19 0455 11/10/19 0500  NA 139 139 137 139 138  K 3.3* 3.7 3.6 4.1 3.9  CL 106 108 104 106 104  CO2 25 23 25 25 24   GLUCOSE 194* 221* 163* 197* 246*  BUN 12 14 15 14 16   CREATININE 0.62 0.52 0.54 0.54 0.65  CALCIUM 8.9 8.8* 8.9 9.0 9.1  MG 1.6* 1.8 1.8 1.7 2.0   CBC: Recent Labs  Lab 11/06/19 0413 11/07/19 0628 11/08/19 0442 11/09/19 0455 11/10/19 0500  WBC 6.6 6.0 6.3 6.5 8.3  HGB 8.8* 8.8* 8.7* 9.1* 9.4*  HCT 26.9* 27.1* 27.5* 28.0* 28.1*  MCV 89.7 89.1 89.6 90.0 88.9  PLT 197 175 176 162 180   CBG: Recent Labs  Lab 11/11/19 0729 11/11/19 1130 11/11/19 1635 11/11/19 2105 11/12/19 0627  GLUCAP 177* 291* 181* 235* 145*   Urinalysis    Component Value Date/Time   COLORURINE COLORLESS (A) 10/08/2019 1848   APPEARANCEUR CLEAR (A) 10/08/2019 1848   LABSPEC 1.017 10/08/2019 1848   PHURINE 6.0 10/08/2019 1848   GLUCOSEU >=500 (A) 10/08/2019 1848   HGBUR NEGATIVE 10/08/2019  1848   BILIRUBINUR NEGATIVE 10/08/2019 1848   KETONESUR NEGATIVE 10/08/2019 1848   PROTEINUR NEGATIVE 10/08/2019 1848   NITRITE NEGATIVE 10/08/2019 1848   LEUKOCYTESUR NEGATIVE 10/08/2019 1848   Microbiology Recent Results (from the past 240 hour(s))  SARS Coronavirus 2 by RT PCR (hospital order, performed in [place] hospital lab) Nasopharyngeal Nasopharyngeal Swab     Status: None   Collection Time: 11/06/19 10:36 AM   Specimen: Nasopharyngeal Swab  Result Value Ref Range Status   SARS Coronavirus 2 NEGATIVE NEGATIVE Final    Comment: (NOTE) SARS-CoV-2 target nucleic acids are NOT DETECTED.  The SARS-CoV-2 RNA is generally detectable in upper and lower respiratory specimens during the acute phase of infection. The lowest concentration of SARS-CoV-2 viral copies this assay can detect is 250 copies / mL. A negative result does not preclude SARS-CoV-2 infection and should not be used as the sole basis for treatment or other patient management decisions.  A negative result may occur with improper specimen collection / handling, submission of specimen other than nasopharyngeal swab, presence of viral mutation(s) within the areas targeted by this assay, and inadequate number of viral copies (<250 copies / mL). A negative result must be combined with clinical observations, patient history, and epidemiological information.  Fact Sheet for Patients:   StrictlyIdeas.no  Fact Sheet for Healthcare Providers: BankingDealers.co.za  This test is not yet approved or  cleared by the [place] FDA and has been authorized for detection and/or diagnosis of SARS-CoV-2 by FDA under an Emergency Use Authorization (EUA).  This EUA will remain in effect (meaning this test can be used) for the duration of the COVID-19 declaration under Section 564(b)(1) of the Act, 21 U.S.C. section 360bbb-3(b)(1), unless the authorization is terminated or revoked  sooner.  Performed at [facility], [place] [address], [place], [place] [address]      Time coordinating discharge:  I have spent 35 minutes face to face with the patient and on the ward discussing the patients care, assessment, plan and disposition with other care givers. >50%  of the time was devoted counseling the patient about the risks and benefits of treatment/Discharge disposition and coordinating care.   SIGNED:   Cherene Altes, MD  [facility] 11/12/2019, 10:49 AM   If 7PM-7AM, please contact night-coverage

## 2019-11-14 LAB — PROINSULIN/INSULIN RATIO
Insulin: 2.2 u[IU]/mL
Proinsulin/Insulin Ratio: 7 %
Proinsulin: 1.1 pmol/L

## 2019-12-03 ENCOUNTER — Ambulatory Visit: Payer: Medicaid Other | Admitting: Neurology

## 2019-12-15 ENCOUNTER — Ambulatory Visit: Payer: Medicaid Other | Admitting: Neurology

## 2020-01-09 ENCOUNTER — Encounter: Payer: Self-pay | Admitting: Neurology

## 2020-01-09 ENCOUNTER — Ambulatory Visit (INDEPENDENT_AMBULATORY_CARE_PROVIDER_SITE_OTHER): Payer: Medicaid Other | Admitting: Neurology

## 2020-01-09 ENCOUNTER — Other Ambulatory Visit: Payer: Self-pay

## 2020-01-09 VITALS — BP 120/79 | HR 108 | Ht 67.0 in | Wt 105.8 lb

## 2020-01-09 DIAGNOSIS — R569 Unspecified convulsions: Secondary | ICD-10-CM | POA: Diagnosis not present

## 2020-01-09 DIAGNOSIS — R9089 Other abnormal findings on diagnostic imaging of central nervous system: Secondary | ICD-10-CM

## 2020-01-09 DIAGNOSIS — R413 Other amnesia: Secondary | ICD-10-CM

## 2020-01-09 MED ORDER — LEVETIRACETAM 750 MG PO TABS: 1500.0000 mg | ORAL_TABLET | Freq: Two times a day (BID) | ORAL | 11 refills | Status: DC

## 2020-01-09 NOTE — Patient Instructions (Signed)
1. Restart Keppra 750mg : Take 2 tablet twice a day  2. Schedule repeat MRI brain with and without contrast  3. Schedule repeat 1-hour EEG  4. Schedule Neurocognitive testing  5. Follow-up in 4 months, call for any changes   Seizure Precautions: 1. If medication has been prescribed for you to prevent seizures, take it exactly as directed.  Do not stop taking the medicine without talking to your doctor first, even if you have not had a seizure in a long time.   2. Avoid activities in which a seizure would cause danger to yourself or to others.  Don't operate dangerous machinery, swim alone, or climb in high or dangerous places, such as on ladders, roofs, or girders.  Do not drive unless your doctor says you may.  3. If you have any warning that you may have a seizure, lay down in a safe place where you can't hurt yourself.    4.  No driving for 6 months from last seizure, as per [facility].   Please refer to the following link on the [place] website for more information: http://www.epilepsyfoundation.org/answerplace/Social/driving/drivingu.cfm   5.  Maintain good sleep hygiene.  6.  Contact your doctor if you have any problems that may be related to the medicine you are taking.  7.  Call 911 and bring the patient back to the ED if:        A.  The seizure lasts longer than 5 minutes.       B.  The patient doesn't awaken shortly after the seizure  C.  The patient has new problems such as difficulty seeing, speaking or moving  D.  The patient was injured during the seizure  E.  The patient has a temperature over 102 F (39C)  F.  The patient vomited and now is having trouble breathing

## 2020-01-09 NOTE — Progress Notes (Signed)
NEUROLOGY CONSULTATION NOTE  Andrea [name] MRN: [id number] DOB: 05/09/1960  Referring provider: Dr. Zeb Comfort Primary care provider: Dr. Loura Pardon  Reason for consult:  Focal status epilepticus  Dear Dr Hortense Ramal:  Thank you for your kind referral of Andrea [name] for consultation of the above symptoms. Although her history is well known to you, please allow me to reiterate it for the purpose of our medical record. The patient was accompanied to the clinic by [name] who also provides collateral information. Records and images were personally reviewed where available.  HISTORY OF PRESENT ILLNESS: This is a pleasant 59 year old right-handed woman with a history of type I DM, hypothyroidism, presenting after hospital discharge for new onset seizures. 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 She was back in the ER on 6/20 again hypoglycemic (glucose 30) with left-sided weakness. Family had not witnessed any seizure activity, however EMS on scene noted 2 focal seizures, given IV Versed and dextrose. She was somnolent and not following commands on arrival. MRI brain showed restricted diffusion in the right medial frontal cortex, right insual, and right hippocampus, no abnormal enhancement. Her prolonged EEG showed generalized, maximal bifrontal periodic epileptiform discharges initially, then right frontocentral periodic discharges. She had multiple episodes of left facial twitching with generalized theta-alpha activity. Seizures improved with Levetiracetam and clobazam, she continued to have left sensory hemineglect, EEG continued to show LPDs on the right hemisphere. She then had recurrence of facial twitching and had a malignancy  workup, CT abdomen/pelvis noted sigmoid colonic wall thickening which could represent focal colitis versus colonic neoplasm. She had LP which was overall unremarkable except for elevated glucose. Pending autoimmune and paraneoplastic Mayo panel, she was started empirically on Solumedrol for 5 days. Panel has come back negative. She was discharged home on Levetiracetam 1500mg  BID and Clobazam 5mg  BID. They deny any further seizures since hospital discharge. She missed 2 appointments scheduled and follows up today, they report running out of seizure medications 3 weeks ago with no seizure recurrence. Glucose levels have stabilized, she has not had significant hypoglycemia like before. She denies any facial twitching, no focal numbness/tingling/weakness. Andrea [name] has not witnessed any staring/unresponsive episodes. Her main concern today is right hip/thigh pain, which started before her June admission. Andrea [name] is asking for memory evaluation. She started having mild memory changes prior to June, however memory changes have progressively worsened since then. She gets confused with dates and repeats herself several times. She says "sometimes I forget." She stopped driving at the beginning of the year because "I was just tired." She denied getting lost driving. She takes her own medications in the morning, her grandson helps her for the evening medications because she may forget. She remembers to pay the rent but gets confused, so Andrea [name] helps. She does not cook. 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 Her daughter had seizures and passed away. She had a normal birth and early development.  There is no history of febrile convulsions, CNS infections such as meningitis/encephalitis, significant traumatic brain injury, neurosurgical procedures.   PAST MEDICAL HISTORY: Past Medical History:  Diagnosis Date  . Diabetes mellitus without  complication ([place])   . Hyperlipidemia   . Hypothyroidism   . Thyroid disease     PAST SURGICAL HISTORY: Past Surgical History:  Procedure Laterality Date  . BIOPSY  11/02/2019   Procedure: BIOPSY;  Surgeon: [name], MD;  Location: [facility] ENDOSCOPY;  Service: Gastroenterology;;  . COLONOSCOPY WITH PROPOFOL N/A 11/02/2019   Procedure: COLONOSCOPY WITH PROPOFOL;  Surgeon: [name], MD;  Location: [place];  Service: Gastroenterology;  Laterality: N/A;  . INCISION AND DRAINAGE ABSCESS Right 06/15/2017   Procedure: INCISION AND DRAINAGE ABSCESS;  Surgeon: Clayburn Pert, MD;  Location: [facility] ORS;  Service: General;  Laterality: Right;  . POLYPECTOMY  11/02/2019   Procedure: POLYPECTOMY;  Surgeon: [name], MD;  Location: [facility] ENDOSCOPY;  Service: Gastroenterology;;    MEDICATIONS: Current Outpatient Medications on File Prior to Visit  Medication Sig Dispense Refill  . aspirin EC 81 MG tablet Take 81 mg by mouth daily.    . cloBAZam (ONFI) 10 MG tablet Take 0.5 tablets (5 mg total) by mouth 2 (two) times daily. Refill by PCP or neurology 30 tablet 0  . feeding supplement, ENSURE ENLIVE, (ENSURE ENLIVE) LIQD Take 237 mLs by mouth 3 (three) times daily between meals. 237 mL 12  . insulin aspart (NOVOLOG) 100 UNIT/ML injection Inject 0-6 Units into the skin 3 (three) times daily with meals. 10 mL 11  . insulin detemir (LEVEMIR) 100 UNIT/ML injection Inject 0.1 mLs (10 Units total) into the skin 2 (two) times daily. 10 mL 11  . levETIRAcetam (KEPPRA) 750 MG tablet Take 2 tablets (1,500 mg total) by mouth 2 (two) times daily. 60 tablet 3  . levonorgestrel (MIRENA) 20 MCG/24HR IUD 1 each by Intrauterine route once.     Marland Kitchen levothyroxine (SYNTHROID) 150 MCG tablet Take 150 mcg by mouth every morning.    Marland Kitchen liothyronine (CYTOMEL) 5 MCG tablet Take 5 mcg by mouth daily. Take in addition to levothyroxine.    Marland Kitchen lisinopril (PRINIVIL,ZESTRIL) 20 MG tablet Take 20 mg by mouth daily.    . metoprolol  tartrate (LOPRESSOR) 50 MG tablet Take 1 tablet (50 mg total) by mouth 2 (two) times daily.    . Multiple Vitamin (MULTIVITAMIN WITH MINERALS) TABS tablet Take 1 tablet by mouth daily.    . potassium chloride SA (KLOR-CON) 20 MEQ tablet Take 1 tablet (20 mEq total) by mouth daily. (Patient taking differently: Take 20 mEq by mouth 2 (two) times daily. ) 30 tablet 0  . rosuvastatin (CRESTOR) 20 MG tablet Take 20 mg by mouth at bedtime.     . tolterodine (DETROL LA) 4 MG 24 hr capsule Take 1 capsule (4 mg total) by mouth daily. 30 capsule 2  . Cholecalciferol 125 MCG (5000 UT) TABS Take 5,000 Units by mouth daily.  (Patient not taking: Reported on 01/09/2020)    . polyethylene glycol (MIRALAX / GLYCOLAX) 17 g packet Take 17 g by mouth daily as needed for moderate constipation. (Patient not taking: Reported on 01/09/2020) 30 each 0  . psyllium (HYDROCIL/METAMUCIL) 95 % PACK Take 1 packet by mouth 2 (two) times daily. (Patient not taking: Reported on 01/09/2020) 240 each   . senna-docusate (SENOKOT S) 8.6-50 MG tablet Take 2 tablets by mouth at bedtime. (Patient not taking: Reported on 01/09/2020) 60 tablet 1  . [DISCONTINUED] pravastatin (PRAVACHOL) 40 MG tablet Take 40 mg by mouth every evening.      No current facility-administered medications on file prior to visit.    ALLERGIES: Allergies  Allergen Reactions  .  Latex Hives    FAMILY HISTORY: Family History  Problem Relation Age of Onset  . Heart disease Father   . Breast cancer Neg Hx     SOCIAL HISTORY: Social History   Socioeconomic History  . Marital status: Single    Spouse name: Not on file  . Number of children: Not on file  . Years of education: Not on file  . Highest education level: Not on file  Occupational History  . Not on file  Tobacco Use  . Smoking status: Never Smoker  . Smokeless tobacco: Never Used  Vaping Use  . Vaping Use: Never used  Substance and Sexual Activity  . Alcohol use: No  . Drug use: No  .  Sexual activity: Not on file  Other Topics Concern  . Not on file  Social History Narrative   Right handed    Lives with family    Social Determinants of Health   Financial Resource Strain:   . Difficulty of Paying Living Expenses: Not on file  Food Insecurity:   . Worried About Charity fundraiser in the Last Year: Not on file  . Ran Out of Food in the Last Year: Not on file  Transportation Needs:   . Lack of Transportation (Medical): Not on file  . Lack of Transportation (Non-Medical): Not on file  Physical Activity:   . Days of Exercise per Week: Not on file  . Minutes of Exercise per Session: Not on file  Stress:   . Feeling of Stress : Not on file  Social Connections:   . Frequency of Communication with Friends and Family: Not on file  . Frequency of Social Gatherings with Friends and Family: Not on file  . Attends Religious Services: Not on file  . Active Member of Clubs or Organizations: Not on file  . Attends Archivist Meetings: Not on file  . Marital Status: Not on file  Intimate Partner Violence:   . Fear of Current or Ex-Partner: Not on file  . Emotionally Abused: Not on file  . Physically Abused: Not on file  . Sexually Abused: Not on file    PHYSICAL EXAM: Vitals:   01/09/20 0909  BP: 120/79  Pulse: (!) 108  SpO2: 99%   General: No acute distress Head:  Normocephalic/atraumatic Skin/Extremities: No rash, no edema Neurological Exam: Mental status: alert and oriented to person, place, and time, no dysarthria or aphasia, Fund of knowledge is appropriate.  Recent and remote memory are intact.  Attention and concentration are normal.  Able to name objects and repeat phrases. See attached clock drawing with all the numbers only on the right side of page (left neglect?) MMSE - Mini Mental State Exam 01/09/2020  Orientation to time 5  Orientation to Place 5  Registration 3  Attention/ Calculation 3  Recall 2  Language- name 2 objects 2  Language-  repeat 1  Language- follow 3 step command 3  Language- read & follow direction 1  Write a sentence 1  Copy design 0  Total score 26    Cranial nerves: CN I: not tested CN II: pupils equal, round and reactive to light, visual fields intact CN III, IV, VI:  full range of motion, no nystagmus, no ptosis CN V: facial sensation intact CN VII: upper and lower face symmetric CN VIII: hearing intact to conversation CN IX, X: gag intact, uvula midline CN XI: sternocleidomastoid and trapezius muscles intact CN XII: tongue midline Bulk & Tone: normal,  no fasciculations. Motor: 5/5 throughout with no pronator drift. Sensation: intact to light touch, cold, pin, vibration and joint position sense.  No extinction to double simultaneous stimulation.   Deep Tendon Reflexes: unable to elicit throughout Cerebellar: no incoordination on finger to nose testing Gait: slow and cautious, no ataxia Tremor: none  IMPRESSION: This is a pleasant 59 year old right-handed woman with a history of type I DM, hypothyroidism, presenting after hospital discharge for new onset seizures. She presented with focal status epilepticus in the setting of hypoglycemia (BG 30 on arrival), EEG captured multiple episodes of left facial twitching with electrographic correlate, there was clinical improvement, EEG continued to show lateralized periodic discharges over the right hemisphere, maximal over the right medial frontocentral region. Her MRI brain on 6/21 showed restricted diffusion in the right medical frontal cortex, right insula, and right hippocampus. LP unremarkable, autoimmune/paraneoplastic panel negative. She was treated empirically with 5 days of IV Solumedrol. She has had no further seizures since June 2021, she ran out of AEDs 3 weeks ago with no seizure recurrence. We discussed restarting Levetiracetam 1500mg  BID, hold off on restarting clobazam since she was drowsy. She still feels overall unwell, granddaughter raises  concern for cognitive changes. MMSE today 26/30, she appears to have hemineglect on the left with clock drawing. Repeat MRI brain with and without contrast and 1-hour EEG will be ordered. She will be scheduled for Neurocognitive testing. [place] driving laws were discussed with the patient, and she knows to stop driving after a seizure, until 6 months seizure-free.   Follow-up with PCP for CT abdomen findings of ?colonic neoplasm. Follow-up with me in 4 months, they know to call for any changes.   Thank you for allowing me to participate in the care of this patient. Please do not hesitate to call for any questions or concerns.   Ellouise Newer, M.D.  CC: Dr. Hortense Ramal, Dr. Simon Rhein

## 2020-01-21 ENCOUNTER — Other Ambulatory Visit: Payer: Medicaid Other

## 2020-01-23 ENCOUNTER — Other Ambulatory Visit: Payer: Medicaid Other

## 2020-01-27 ENCOUNTER — Ambulatory Visit: Payer: Medicaid Other | Admitting: Neurology

## 2020-01-28 ENCOUNTER — Ambulatory Visit (INDEPENDENT_AMBULATORY_CARE_PROVIDER_SITE_OTHER): Payer: Medicaid Other | Admitting: Neurology

## 2020-01-28 ENCOUNTER — Other Ambulatory Visit: Payer: Self-pay

## 2020-01-28 DIAGNOSIS — R569 Unspecified convulsions: Secondary | ICD-10-CM

## 2020-01-28 DIAGNOSIS — R413 Other amnesia: Secondary | ICD-10-CM

## 2020-01-28 DIAGNOSIS — R9089 Other abnormal findings on diagnostic imaging of central nervous system: Secondary | ICD-10-CM

## 2020-01-28 NOTE — Procedures (Signed)
ELECTROENCEPHALOGRAM REPORT  Date of Study: 01/28/2020  Patient's Name: Andrea [name] MRN: [id number] Date of Birth: 01-13-1961  Referring Provider: Dr. Ellouise Newer  Clinical History: This is a 60 year old woman with new onset seizures with status epilepticus arising from the right hemisphere in June 2021. She continues to have cognitive changes.  Medications: KEPPRA 750 MG tablet aspirin EC 81 MG tablet NOVOLOG 100 UNIT/ML injection LEVEMIR 100 UNIT/ML injection MIRENA 20 MCG/24HR IUD SYNTHROID 150 MCG tablet CYTOMEL 5 MCG tablet PRINIVIL,ZESTRIL 20 MG tablet LOPRESSOR 50 MG tablet MULTIVITAMIN WITH MINERALS TABS tablet KLOR-CON 20 MEQ tablet CRESTOR 20 MG tablet DETROL LA 4 MG 24 hr capsule   Technical Summary: A multichannel digital 1-hour EEG recording measured by the international 10-20 system with electrodes applied with paste and impedances below 5000 ohms performed in our laboratory with EKG monitoring in an awake and asleep patient.  Hyperventilation was not performed. Photic stimulation was performed.  The digital EEG was referentially recorded, reformatted, and digitally filtered in a variety of bipolar and referential montages for optimal display.    Description: The patient is awake and asleep during the recording.  During maximal wakefulness, there is a symmetric, medium voltage 8 Hz posterior dominant rhythm that attenuates with eye opening.  The record is symmetric.  During drowsiness and sleep, there is an increase in theta slowing of the background.  Vertex waves and symmetric sleep spindles were seen.  Photic stimulation did not elicit any abnormalities.  There were no epileptiform discharges or electrographic seizures seen.    EKG lead was unremarkable.  Impression: This 1-hour awake and asleep EEG is normal.    Clinical Correlation: A normal EEG does not exclude a clinical diagnosis of epilepsy.  If further clinical questions remain, prolonged EEG may be  helpful.  Clinical correlation is advised.   Ellouise Newer, M.D.

## 2020-01-29 ENCOUNTER — Telehealth: Payer: Self-pay

## 2020-01-29 NOTE — Telephone Encounter (Signed)
-----   Message from Cameron Sprang, MD sent at 01/28/2020  4:32 PM EDT ----- Pls let her know the brain wave test was normal, which is great. Proceed with repeat MRI brain and memory testing as scheduled. I don't see an MRI date, pls confirm if she has scheduled. Thanks!

## 2020-01-29 NOTE — Telephone Encounter (Signed)
Pt called to go over EEG results no answer voice mail left to call the office back

## 2020-01-30 ENCOUNTER — Telehealth: Payer: Self-pay

## 2020-01-30 NOTE — Telephone Encounter (Signed)
Pt called was given EEG results also given Number to [facility] so she can call to get it scheduled

## 2020-01-30 NOTE — Telephone Encounter (Signed)
-----   Message from Cameron Sprang, MD sent at 01/28/2020  4:32 PM EDT ----- Pls let her know the brain wave test was normal, which is great. Proceed with repeat MRI brain and memory testing as scheduled. I don't see an MRI date, pls confirm if she has scheduled. Thanks!

## 2020-02-17 ENCOUNTER — Other Ambulatory Visit: Payer: Medicaid Other

## 2020-02-20 ENCOUNTER — Other Ambulatory Visit: Payer: Self-pay | Admitting: Neurology

## 2020-02-21 ENCOUNTER — Other Ambulatory Visit: Payer: Medicaid Other

## 2020-02-23 ENCOUNTER — Telehealth: Payer: Self-pay | Admitting: Neurology

## 2020-02-23 ENCOUNTER — Other Ambulatory Visit: Payer: Self-pay

## 2020-02-23 DIAGNOSIS — R9089 Other abnormal findings on diagnostic imaging of central nervous system: Secondary | ICD-10-CM

## 2020-02-23 DIAGNOSIS — R413 Other amnesia: Secondary | ICD-10-CM

## 2020-02-23 DIAGNOSIS — R569 Unspecified convulsions: Secondary | ICD-10-CM

## 2020-02-23 NOTE — Telephone Encounter (Signed)
Patient called and said, "I need an order placed for an MRI of the head."

## 2020-02-23 NOTE — Telephone Encounter (Signed)
Order replaced back in epic

## 2020-03-03 ENCOUNTER — Other Ambulatory Visit: Payer: Self-pay

## 2020-03-03 ENCOUNTER — Ambulatory Visit (INDEPENDENT_AMBULATORY_CARE_PROVIDER_SITE_OTHER): Payer: Medicaid Other | Admitting: Counselor

## 2020-03-03 ENCOUNTER — Encounter: Payer: Self-pay | Admitting: Counselor

## 2020-03-03 DIAGNOSIS — F09 Unspecified mental disorder due to known physiological condition: Secondary | ICD-10-CM | POA: Diagnosis not present

## 2020-03-03 DIAGNOSIS — Z87898 Personal history of other specified conditions: Secondary | ICD-10-CM | POA: Diagnosis not present

## 2020-03-03 DIAGNOSIS — F418 Other specified anxiety disorders: Secondary | ICD-10-CM

## 2020-03-03 NOTE — Progress Notes (Signed)
[facility]  Patient Name: Andrea [name] MRN: [id number] Date of Birth: 08/02/1960 Age: 59 y.o. Education: 59 yeasr  Referral Circumstances and Background Information  Andrea [name] is a 59 y.o., right-hand dominant, single woman with a history DM1, hypothyroidism, and new onset seizures resulting in a hospitalization in June, 2021, in the setting of hypoglycemia. She has been following with Dr. Delice Lesch since the hospitalization, who referred her for evaluation of cognitive changes, which have been reportedly progressive since June, 2021. She has reportedly not had any seizure recurrence since her hospitalization, despite running out of seizure medications for 3 weeks in September.   On interview, the patient reported that she has some issues with short term memory, since perhaps sometime last year. She reported that she actually thinks they are getting better, although there was some worsening after her hospitalization. I see that her granddaughter (57 y/o) was concerned that her difficulties have been progressive since June, 2021 although she says that is not the case and that she thinks her problems are getting better today. I was unable to contact her granddaughter because she said she was at work, so I did not need to. She reported forgetting things, for instance she will wake up to get her grandson up, take her medication, and then forget if she has taken it when she goes back to sleep. She also forgets conversations she has had on the phone, for instance if someone asks her to communicate something to a family member, she will forget what they said. She is concerned that that she may be getting early onset dementia. She reported that she is the primary one who was worried about her problems and wished to get evaluated, although her grandson and granddaughter who live with her have noticed. On review of cognitive symptoms, she reported that she will get  mixed up with days but not the month or the year, she denied word finding problems, she denied problems paying attention to things, and she denied issues with judgment or problem solving. Given report of neglect, I specifically asked regarding her visuospatial functioning, and she does not appreciate difficulties bumping into things, with identifying faces, with navigating, or with seeing things on her plate when she is eating. She reported that she was falling previously, she would not trip, she would just go down, starting in August. She is not falling now though, she uses a walker and walks more to get practice. She denied any problems tying shoes, using utensils, alien limb type phenomena or myoclonic/jerky type movements. She has noticed some minor changes in handwriting, it is sloppier. She denied any visual or auditory hallucinations, suspiciousness/paranoia. She reported that her mood is "ok," and she looks quite fatigued today, she reported that she does get tired. She feels like her stamina isn't what it was. She also feels as though she cannot stand for long, she gets tired quickly and has to sit down. She is sleeping well, typically 7-8 hours. She reported that she sleeps at "weird times," because she isn't working. It sounds like she is quite tired, she can sleep at night plus 5 or 6 hours during the day. Her appetite is "so so," some days it is better than others. She has lost about 70lbs over the past two years.   With respect to functioning, the patient reported that she is essentially independent, but she gets a lot of help from her grandson who is 54. She reported that she stopped  working because she was feeling sick (e.g., weak, tired, etc.) back in October, 2020. She was working for a [employer]). She was worried if something happened to a resident, she wouldn't be able to react. She had only been doing that a year and was out of work for several years prior, as she was getting  her diabetes under control.  She is nevertheless essentially independent with everything around the house. She has a bank account and pays the bills for the house, she has a car although she isn't driving related to the seizure. Her grandson helps her with her medications, although she has requested that he show her how to do that. She is cooking to some extent. She manages all her own medical appointments.   Past Medical History and Review of Relevant Studies   Patient Active Problem List   Diagnosis Date Noted  . Goals of care, counseling/discussion   . Palliative care by specialist   . Protein-calorie malnutrition, severe 11/04/2019  . Seizure ([place]) 10/19/2019  . Hypoglycemia 10/19/2019  . Hypokalemia 10/19/2019  . Back pain 06/14/2017  . Fibroids 06/14/2017  . 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 Hypertension associated with diabetes ([place]) 06/14/2017  . Hypothyroid 06/14/2017  . Menometrorrhagia 06/14/2017  . Ovarian failure 09/22/2016  . Colon polyps 10/29/2014  . Left foot pain 04/15/2014  . Abnormal uterine bleeding 02/24/2014  . Cervical polyp 10/16/2013  . High risk medication use 10/16/2013  . Hyperlipidemia 10/16/2013  . Increased frequency of urination 10/16/2013  . Type 1 diabetes mellitus with other specified complication ([place]) 47/82/9562  . Diabetic keto-acidosis ([place]) 01/01/2013  . Graves disease 01/01/2013  . Hyperglycemia 01/01/2013  . Urine ketones 01/01/2013  . Annual physical exam 08/15/2012  . Pigmented skin lesions 08/15/2012    Review of Neuroimaging and Relevant Medical History: The patient has an MRI of the brain from 10/19/2019 that showed restricted diffuse involving the cortex in the medial right frontal lobe, right insula, and "likely" right hippocampus not conforming to a vascular territory and without associated edema. She had repeat MRI on 10/20/2019 that did not show any additional findings, although diffusion sequences were not repeated. I was able to  independently review her imaging and note no additional pathologic findings or concerning patterns of volume loss. She has no significant leukoaraiosis.   The patient had prolonged EEG during her recent hospital admission (10/19/2019) with generalized, maximal bifrontal periodic epileptiform discharges then right frontocentral periodic discharges. She had multiple episodes of left facial twitching with generalized theta-alpha activity. She was given Keppra and Clobazam and improved clinically but EG continued to show lateralized periodic discharges in the right hemisphere.   Normal neurological exam with the exception of left hemineglect. Gait was slow and cautious but no ataxia. Deep tendon reflexes were not able to be elicited.   Normal EEG on 01/28/2020 with Dr. Delice Lesch  Current Outpatient Medications  Medication Sig Dispense Refill  . aspirin EC 81 MG tablet Take 81 mg by mouth daily.    . Cholecalciferol 125 MCG (5000 UT) TABS Take 5,000 Units by mouth daily.  (Patient not taking: Reported on 01/09/2020)    . feeding supplement, ENSURE ENLIVE, (ENSURE ENLIVE) LIQD Take 237 mLs by mouth 3 (three) times daily between meals. 237 mL 12  . insulin aspart (NOVOLOG) 100 UNIT/ML injection Inject 0-6 Units into the skin 3 (three) times daily with meals. 10 mL 11  . insulin detemir (LEVEMIR) 100 UNIT/ML injection Inject 0.1 mLs (10 Units total)  into the skin 2 (two) times daily. 10 mL 11  . levETIRAcetam (KEPPRA) 750 MG tablet Take 2 tablets (1,500 mg total) by mouth 2 (two) times daily. 60 tablet 11  . levonorgestrel (MIRENA) 20 MCG/24HR IUD 1 each by Intrauterine route once.     Marland Kitchen levothyroxine (SYNTHROID) 150 MCG tablet Take 150 mcg by mouth every morning.    Marland Kitchen liothyronine (CYTOMEL) 5 MCG tablet Take 5 mcg by mouth daily. Take in addition to levothyroxine.    Marland Kitchen lisinopril (PRINIVIL,ZESTRIL) 20 MG tablet Take 20 mg by mouth daily.    . metoprolol tartrate (LOPRESSOR) 50 MG tablet Take 1 tablet (50 mg  total) by mouth 2 (two) times daily.    . Multiple Vitamin (MULTIVITAMIN WITH MINERALS) TABS tablet Take 1 tablet by mouth daily.    . polyethylene glycol (MIRALAX / GLYCOLAX) 17 g packet Take 17 g by mouth daily as needed for moderate constipation. (Patient not taking: Reported on 01/09/2020) 30 each 0  . potassium chloride SA (KLOR-CON) 20 MEQ tablet Take 1 tablet (20 mEq total) by mouth daily. (Patient taking differently: Take 20 mEq by mouth 2 (two) times daily. ) 30 tablet 0  . psyllium (HYDROCIL/METAMUCIL) 95 % PACK Take 1 packet by mouth 2 (two) times daily. (Patient not taking: Reported on 01/09/2020) 240 each   . rosuvastatin (CRESTOR) 20 MG tablet Take 20 mg by mouth at bedtime.     . senna-docusate (SENOKOT S) 8.6-50 MG tablet Take 2 tablets by mouth at bedtime. (Patient not taking: Reported on 01/09/2020) 60 tablet 1  . tolterodine (DETROL LA) 4 MG 24 hr capsule Take 1 capsule (4 mg total) by mouth daily. 30 capsule 2   No current facility-administered medications for this visit.    Family History  Problem Relation Age of Onset  . Heart disease Father   . Breast cancer Neg Hx    There is no  family history of dementia. Her father unfortunately was shot fatally at 67, and her mother passed at 74, and she denied that either of them had dementia. She has 4 living siblings, she is the second youngest, and none of them have dementia. There is a family history of psychiatric illness. She had a niece with schizophrenia and her brother also has mental health issues.   Psychosocial History  Developmental, Educational and Employment History: The patient is a native of [place]. She reported that had a decent childhood and denied any abuse or neglect. She reported that in school, she did adequately, although she really didn't like school and wasn't very academically inclined. She said she earned A's, B's, C's, and some D's. She denied failing classes or ever being held back. 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 She had been working with her last agency a year. She reported that she was out of work for about 5 years, from 2014-2019 because she was having a hard time managing her diabetes. 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   Psychiatric History: None  Substance Use History: The patient doesn't drink alcohol, she doesn't use drugs, and she does not smoke cigarettes. She used to smoke and quit about 10 years ago.   Relationship History and Living Cimcumstances: The patient has never been married. She had a daughter from a previous relationship, who unfortunately passed in 2015 shortly after her diabetes was diagnosed.  Mental Status and Behavioral Observations  Sensorium/Arousal: The patient's level of arousal was awake and alert. Hearing and vision were adequate for testing purposes. Orientation: The patient was alert and fully oriented to person, place, time, and situation.  Appearance: The patient was dressed in appropriate casual clothing.  Behavior: The patient presented as somewhat flat and quite lacking in spontaneity. Otherwise, she was pleasant and appropriate.  Speech/language: Slow in rate, somewhat soft spoken, normal rhythm and prosody. No word finding pauses or paraphasias observed.  Gait/Posture: Gait was slow, cautious, ambulated with a walker. No ataxia.  Movement: The patient presented as somewhat hypokinetic but had no frank movement disorder signs/symptoms on observation Social Comportment: Appropriate, pleasant Mood: "Allright" Affect: Blunted Thought process/content: The patient had minimal spontaneous speech but was logical and goal oriented in her responses to queestions.  Safety: No thoughts of harming self or others on direct questioning.  Insight: Fair, patient is concerned about her cognitive changes.   MMSE - Mini Mental State Exam 03/03/2020 01/09/2020    Orientation to time 5 5  Orientation to Place 5 5  Registration 3 3  Attention/ Calculation 5 3  Recall 2 2  Language- name 2 objects 2 2  Language- repeat 1 1  Language- follow 3 step command 2 3  Language- read & follow direction 1 1  Write a sentence 1 1  Copy design 0 0  Total score 27 26   Plan  Andrea [name] was seen for a psychiatric diagnostic evaluation and neuropsychological testing. She is a 59 year old, right-hand dominant woman with DM1 and new onset seizures in the setting of hypoglycemia in June, 2021. She has not had any recurrence of her seizures and is followed by Dr. Delice Lesch who referred her for cognitive evaluation. The patient presented to the appointment alone today, she claims she wasn't aware she needed to bring someone. She presented as fairly lucid but was quite slow and appeared tired. Following the clinical interview, she started with the technician, but then reported that she had to leave at 3:00pm and thus could not complete testing. She will return for reevaluation with Milana Kidney, B.S. (Psychometrist) at another date. As per Ms. Barbra Sarks, she was noted to have significant difficulties operating her phone to call her ride, which took about 15 minutes and she still wasn't able to find the correct number (she reported it was a new phone).   Andrea Simas Nicole Kindred, PsyD, [place] Clinical Neuropsychologist  Informed Consent and Coding/Compliance  Risks and benefits of the evaluation were discussed with the patient prior to all testing procedures. I conducted a clinical interview   with Andrea [name]. Testing will be completed at another time.   Services associated with this encounter: Clinical Interview [phone])

## 2020-03-09 ENCOUNTER — Ambulatory Visit: Payer: Medicaid Other | Admitting: Psychology

## 2020-03-09 ENCOUNTER — Other Ambulatory Visit: Payer: Self-pay

## 2020-03-09 DIAGNOSIS — F09 Unspecified mental disorder due to known physiological condition: Secondary | ICD-10-CM

## 2020-03-09 NOTE — Progress Notes (Signed)
I met briefly with Andrea [name] prior to her appointment with Lupita Shutter, B.S. (Psychometrician) to complete testing. She reported that she was well and that there have been no major changes in her health status since our initial visit. She expressed her willingness to proceed with testing. There were no safety concerns. Testing was completed without incident, with the exception of her discontinuing on one assessment task. Full and complete report to follow pending norming and interpretation. Billing for today's services and associated professional services will be reflected in her initial visit note.

## 2020-03-09 NOTE — Progress Notes (Signed)
   Psychometrist Note   Cognitive testing was administered to Andrea [name] by Milana Kidney, B.S. (Technician) under the supervision of Alphonzo Severance, Psy.D., ABN. Ms. Brandau was able to tolerate all test procedures. Dr. Nicole Kindred met with the patient as needed to manage any emotional reactions to the testing procedures. Rest breaks were offered.    The battery of tests administered was selected by Dr. Nicole Kindred with consideration to the patient's current level of functioning, the nature of her symptoms, emotional and behavioral responses during the interview, level of literacy, observed level of motivation/effort, and the nature of the referral question. This battery was communicated to the psychometrist. Communication between Dr. Nicole Kindred and the psychometrist was ongoing throughout the evaluation and Dr. Nicole Kindred was immediately accessible at all times. Dr. Nicole Kindred provided supervision to the technician on the date of this service, to the extent necessary to assure the quality of all services provided.    Ms. Deeg will return in approximately one week for an interactive feedback session with Dr. Nicole Kindred, at which time test performance, clinical impressions, and treatment recommendations will be reviewed in detail. The patient understands she can contact our office should she require our assistance before this time.   A total of 105 minutes of billable time were spent with Andrea [name] by the technician, including test administration and scoring time. Billing for these services is reflected in Dr. Les Pou note.   This note reflects time spent with the psychometrician and does not include test scores, clinical history, or any interpretations made by Dr. Nicole Kindred. The full report will follow in a separate note.

## 2020-03-10 ENCOUNTER — Encounter: Payer: Medicaid Other | Admitting: Counselor

## 2020-03-10 NOTE — Progress Notes (Signed)
[facility]  Patient Name: Andrea [name] MRN: [id number] Date of Birth: 05/09/1960 Age: 59 y.o. Education: 13 years  Measurement properties of test scores: IQ, Index, and Standard Scores (SS): Mean = 100; Standard Deviation = 15 Scaled Scores (Ss): Mean = 10; Standard Deviation = 3 Z scores (Z): Mean = 0; Standard Deviation = 1 T scores (T); Mean = 50; Standard Deviation = 10  TEST SCORES:    Note: This summary of test scores accompanies the interpretive report and should not be interpreted by unqualified individuals or in isolation without reference to the report. Test scores are relative to age, gender, and educational history as available and appropriate.   Performance Validity        The Dot Counting Test: Raw Descriptor      E-Score 19 Below Expectation  "A" Random Letter Test Errors 1 Within Expectation      Embedded Measures: Raw Descriptor      RBANS Effort Index: 0 Within Expectation      WAIS-IV Reliable Digit Span 8 Within Expectation      WAIS-IV Reliable Digit Span Revised 11 Within Expectation      Expected Functioning        Wide Range Achievement Test (Word Reading): Standard/Scaled Score Percentile       Word Reading 70 2      Cognitive Testing        RBANS, Form : Standard/Scaled Score Percentile  Total Score 74 4  Immediate Memory 65 1      List Learning 4 2      Story Memory 4 2  Visuospatial/Constructional 78 7      Figure Copy   (17) 8 25      Judgment of Line Orientation   (11) --- 3-9  Language 87 19      Picture Naming --- 17-25      Semantic Fluency 6 9  Attention 75 5      Digit Span 8 25      Coding 4 2  Delayed Memory 91 27      List Recall   (3) --- 3-9      List Recognition   (20) --- 51-75      Story Recall   (5) 5 5      Figure Recall   (8) 5 5      Wechsler Adult Intelligence Scale - IV: Standard/Scaled Score Percentile  Working Memory Index 74 4      Digit Span 7 16          Digit  Span Forward 8 25          Digit Span Backward 10 50          Digit Span Sequencing 6 9      Arithmetic 4 2  Processing Speed Index 62 1      Symbol Search 3 1      Coding 3 1      Neuropsychological Assessment Battery (Language Module): T-score Percentile      Naming   (23) 22 <1      Verbal Fluency: T-score Percentile      Controlled Oral Word Association (F-A-S) 37 9      Semantic Fluency (Animals) 34 5      Trail Making Test: T-Score Percentile      Part A 21 <1      Part B 39 14      Modified [place] Card  Sorting Test [facility]): Standard/T-Score Percentile   Discontinued Discontinued      [place] Diagnostic Aphasia Exam: Raw Score Scaled Score      Complex Ideational Material 8 3      Clock Drawing Raw Score Descriptor      Command  Mild Impairment      Symbol Cancellation Test ([place]) T-score Percentile      Left-Sided Errors <20 <1      Right-Sided Errors <20 <1      [place] Cross Drawing Lenny Pastel) T-score Percentile      Cross Copy <20 <1      Rating Scales         Raw Score Descriptor  Patient Health Questionnaire - 9 10 Moderate  GAD-7 2 Within Normal Limits   Obe Ahlers V. Nicole Kindred PsyD, [place] Clinical Neuropsychologist

## 2020-03-11 NOTE — Progress Notes (Signed)
[facility]  Patient Name: Andrea [name] MRN: [id number] Date of Birth: [birth year] Age: 59 y.o. Education: 65 years  Clinical Impressions  Andrea [name] is a 59 y.o., right-hand dominant, single woman with a history of DM1, hypothyroidism, and new onset seizures resulting in a hospitalization in June, 2021, in the setting of hypoglycemia. MRI showed restricted diffusion throughout various aspects of the right hemisphere (right medial frontal cortex, right insula, and hippocampus). She has been following with Dr. Delice Lesch as an outpatient since then, and her granddaughter expressed concerns about cognitive changes that she felt had been progressive since June. The patient presented to the current evaluation alone and thought her issues started before June and were in fact getting better. I attempted to contact both her granddaughter and her friend Andrea [name] [phone]) with her permission, but was unable to reach them . Her imaging is unremarkable from a volume loss and vascular disease perspective on my review. She had a normal elemental exam but suspicion of some left sided neglect on clock drawing.   Andrea [name] demonstrated unusually (nearly extremely low) word reading, suggesting limited academic involvement/enrichment and substantially tempering expectations for her test findings. Nevertheless, she demonstrated a very consistent pattern of mainly frontal-subcortical difficulties with diminished memory encoding in the setting of intact retention and extremely low processing speed. Processing speed problems likely affected her scores on other measures. She also likely has some level of visuospatial difficulties given problems with drawing the overlapping pentagons and unusually low judgment of angular line orientation, although they are not florid. On extended testing with Albert's test and the Symbol Cancellation Test, she has some generalized visual attention problems  and had a high level of errors but did not demonstrate any frank neglect or lateralized findings suggesting frank inattention. She did have naming problems. She reported moderate levels of depressive symptoms and minimal anxiety symptoms.   Andrea [name] is thus demonstrating cognitive impairment of a mainly frontal-subcortical variety with some additional visuospatial findings (albeit no evidence of frank neglect on neuropsychological testing). These findings may be considered somewhat lateralizing (I.e., greater right hemispheric problems). 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 Medication side effects, sequelae from her seizure (e.g., hypoperfusion), and general poor health status including poorly controlled diabetes are suspected as likely contributory and may be the only problems, although the possibility of an underlying condition cannot be ruled out. Her seizures were also after her cognitive problems allegedly began, if the history she provided is accurate. Recommend that she be reevaluated in 1 to 2 years and additional collateral history will also be helpful.   Diagnostic Impressions: Mild neurocognitive disorder due to multiple etiologies  Recommendations to be discussed with patient  Your performance and presentation on neuropsychological assessment were consistent with diminished performance in mainly "frontal-subcortical" abilities, that is with things involving cognitive efficiency, processing speed, and learning new information. You also had some naming problems and visuospatial problems. These findings are suggestive of at least a mild cognitive impairment level problem. Given your report that you are still able to manage your affairs, I do not think you have a dementia level problem, although further information from an informant would be helpful. I was unable to contact your daughter or your friend Andrea [name].    The major difference between mild cognitive impairment (MCI) and dementia is in severity and potential prognosis. Once someone reaches a level of severity adequate to be diagnosed with a dementia,  there is usually progression over time, though this may be years. On the other hand, mild cognitive impairment, while a significant risk for dementia in future, does not always progress to dementia, and in some instances stays the same or can even revert to normal. It is important to realize that if MCI is due to underlying Alzheimer's disease, it will most likely progress to dementia eventually. The rate of conversion to Alzheimer's dementia from amnestic MCI is about 15% per year versus the general population risk of conversion of 2% per year.   In your case, I am not entirely sure what the cause of your mild cognitive impairment is. Sometimes, when people have seizures they can have lingering negative effects on the brain via decreased blood flow and other mechanisms, and that is a possible cause. Uncontrolled diabetes can also exert interfering effects because your brain does not function as well in the presence of high or low blood glucose. Nevertheless, the possibility that this is due to an underlying medical condition cannot be ruled out and therefore, I think you should be followed over time. I would recommend that you return for reevaluation in 1 to 2 years to monitor your progress.   As for now, I would suggest that you get help from family members to double check your self-administration of medications and make sure you are taking them accurately. I see that you have not recently had an A1C, but your last A1C was 12.4, which is far too high and suggests you are not controlling your blood sugar well. Uncontrolled diabetes is also a risk factor for seizures.   You may wish to schedule an appointment with your Primary Care Doctor to follow up regarding your diabetes. You may also benefit from diabetes  education if you have not had that recently.   I suggest that you engage in healthy lifestyle changes, including regularly walking, getting exercise as deemed medically appropriate, and eating a metabolically healthy diet.   You are aware of [place] driving laws and that you should not drive until seizure free for 6 months, and ideally until you are cleared by Dr. Delice Lesch.   Finally, you scored in the moderate range for depressive symptoms and reported that you often do not get much pleasure out of things or look forward to doing things. You could consider speaking with your PCP about an antidepressant.   Test Findings  Test scores are summarized in additional documentation associated with this encounter. Test scores are relative to age, gender, and educational history as available and appropriate. There were no concerns about performance validity as all findings fell within normal expectations.   General Intellectual Functioning/Achievement:  Performance on single word reading was unusually (nearly extremely) low, which tempers expectations for her test performance significantly and may be due to limited academic enrichment.   Attention and Processing Efficiency: Performance on indicators of working memory and auditory attention was variable, with an impression of some possible mild decline. She performed at an unusually low level, overall, on the Working Memory Index of the WAIS-IV. Digit repetition forward and backward were average, whereas digit resequencing in ascending order was unusually low and mental solving of arithmetical word problems without paper and pencil was extremely low.   Performance on processing speed measures was low across the board, with an extremely low score on the Processing Speed Index of the WAIS-IV. Timed number-symbol coding was extremely low on two different measures. Performance on an analogous indicator of efficient visual scanning and  efficient visual matching was  extremely low. Simple numeric sequencing was extremely low.   Language: Performance on language indicators showed impaired verbal confrontation naming. While it is possible that this score to some extent reflects premorbid issues, she did miss items that she was able to get with phonemic cues, suggesting it is not due to limited vocabulary knowledge alone. Generation of words in response to the letters F-A-S was low average (almost unusually low) and category fluency (animals, fruits & vegetables) was unusually low.   Visuospatial Function: Performance on visuospatial and constructional measures was weak, with an unusually low score on the overall index. She did well on copy of the RBANS modestly complex figure, which was average, but had difficulty with drawing the overlapping pentagons of the MMSE and on [place]. This suggests at least some level of problem with Research officer, political party. Judgment of angular line orientations was unusually low. On extended testing her line bisection appeared normal on Albert's test. She had low levels of accuracy and extremely low errors scores on the Symbol Cancellation Test, albeit in a non-lateralizing pattern.   Learning and Memory: Performance on measures of memory and learning was below expectation with a profile showing mainly encoding problems in the setting of relatively better retention and delayed recall. This is not a storage profile and instead may reflect frontal-subcortical type issues.   In the verbal realm, learning for material including a 10-item word list and short story was extremely low. She retained 3 words on delayed recall and 5 of the short story details, generating unusually low delayed recall scores. She did much better with recognition cues, with an average to high average (errorless) score, which means there may be a retrieval component.   In the visual realm, delayed recall of a modestly complex geometric figure was unusually  low.   Executive Functions: Performance on executive indicators was mixed, with a general impression of some likely problems. Clock drawing was suggestive of mild impairment with poor spatial arrangement of the numbers (albeit not in a way suggesting frank neglect), and no size difference between hands. Alternating sequencing of numbers and letters of the alphabet was low average and generation of words in response to the letters F-A-S was low average. She had a hard time when reasoning with verbal information, getting an extremely low score on the Complex Ideational Material. She discontinued the Modified [employer], suggesting difficulties with that measure.   Rating Scale(s): Ms. Buttrey screened in the moderate range for depressive symptoms and in the mild range for anxiety symptoms.   Viviano Simas Nicole Kindred PsyD, [place] Clinical Neuropsychologist

## 2020-03-13 ENCOUNTER — Inpatient Hospital Stay: Admission: RE | Admit: 2020-03-13 | Payer: Medicaid Other | Source: Ambulatory Visit

## 2020-03-16 ENCOUNTER — Ambulatory Visit (INDEPENDENT_AMBULATORY_CARE_PROVIDER_SITE_OTHER): Payer: Medicaid Other | Admitting: Counselor

## 2020-03-16 ENCOUNTER — Other Ambulatory Visit: Payer: Self-pay

## 2020-03-16 ENCOUNTER — Encounter: Payer: Self-pay | Admitting: Counselor

## 2020-03-16 DIAGNOSIS — G3184 Mild cognitive impairment, so stated: Secondary | ICD-10-CM

## 2020-03-16 NOTE — Progress Notes (Signed)
NEUROPSYCHOLOGY FEEDBACK NOTE [facility]  Feedback Note: I met with Andrea [name] to review the findings resulting from her neuropsychological evaluation. Since the last appointment, she has been about the same. She is removing her grand daughter from her contact list, because they have been arguing and she doesn't want her involved in her care. I took time to reconcile the history with her, and it turns out that she did not notice her memory and thinking problems until having the seizure. She stated that her sickness and weight loss started before the seizure, but not her thinking and memory problems. This is positive and makes it less likely that her issues are due to another underlying condition. This is also consistent with her granddaughters report and therefore, there is a convergence of evidence. Time was spent reviewing the impressions and recommendations that are detailed in the evaluation report. We discussed at length the importance of dietary and lifestyle changes for long-term management of diabetes, in addition to medications. She has her grandson helping with her medications and is getting him to help educate her (she is fairly independent already). Other interventions as reflected in the patient instructions. I took time to explain the findings and answer all the patient's questions. I encouraged Andrea [name] to contact me should she have any further questions or if further follow up is desired.   Current Medications and Medical History   Current Outpatient Medications  Medication Sig Dispense Refill  . aspirin EC 81 MG tablet Take 81 mg by mouth daily.    . Cholecalciferol 125 MCG (5000 UT) TABS Take 5,000 Units by mouth daily.  (Patient not taking: Reported on 01/09/2020)    . feeding supplement, ENSURE ENLIVE, (ENSURE ENLIVE) LIQD Take 237 mLs by mouth 3 (three) times daily between meals. 237 mL 12  . insulin aspart (NOVOLOG) 100 UNIT/ML injection Inject 0-6 Units into the skin  3 (three) times daily with meals. 10 mL 11  . insulin detemir (LEVEMIR) 100 UNIT/ML injection Inject 0.1 mLs (10 Units total) into the skin 2 (two) times daily. 10 mL 11  . levETIRAcetam (KEPPRA) 750 MG tablet Take 2 tablets (1,500 mg total) by mouth 2 (two) times daily. 60 tablet 11  . levonorgestrel (MIRENA) 20 MCG/24HR IUD 1 each by Intrauterine route once.     Marland Kitchen levothyroxine (SYNTHROID) 150 MCG tablet Take 150 mcg by mouth every morning.    Marland Kitchen liothyronine (CYTOMEL) 5 MCG tablet Take 5 mcg by mouth daily. Take in addition to levothyroxine.    Marland Kitchen lisinopril (PRINIVIL,ZESTRIL) 20 MG tablet Take 20 mg by mouth daily.    . metoprolol tartrate (LOPRESSOR) 50 MG tablet Take 1 tablet (50 mg total) by mouth 2 (two) times daily.    . Multiple Vitamin (MULTIVITAMIN WITH MINERALS) TABS tablet Take 1 tablet by mouth daily.    . polyethylene glycol (MIRALAX / GLYCOLAX) 17 g packet Take 17 g by mouth daily as needed for moderate constipation. (Patient not taking: Reported on 01/09/2020) 30 each 0  . potassium chloride SA (KLOR-CON) 20 MEQ tablet Take 1 tablet (20 mEq total) by mouth daily. (Patient taking differently: Take 20 mEq by mouth 2 (two) times daily. ) 30 tablet 0  . psyllium (HYDROCIL/METAMUCIL) 95 % PACK Take 1 packet by mouth 2 (two) times daily. (Patient not taking: Reported on 01/09/2020) 240 each   . rosuvastatin (CRESTOR) 20 MG tablet Take 20 mg by mouth at bedtime.     . senna-docusate (SENOKOT S) 8.6-50 MG  tablet Take 2 tablets by mouth at bedtime. (Patient not taking: Reported on 01/09/2020) 60 tablet 1  . tolterodine (DETROL LA) 4 MG 24 hr capsule Take 1 capsule (4 mg total) by mouth daily. 30 capsule 2   No current facility-administered medications for this visit.    Patient Active Problem List   Diagnosis Date Noted  . Goals of care, counseling/discussion   . Palliative care by specialist   . Protein-calorie malnutrition, severe 11/04/2019  . Seizure ([place]) 10/19/2019  . Hypoglycemia  10/19/2019  . Hypokalemia 10/19/2019  . Back pain 06/14/2017  . Fibroids 06/14/2017  . 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 Hypertension associated with diabetes ([place]) 06/14/2017  . Hypothyroid 06/14/2017  . Menometrorrhagia 06/14/2017  . Ovarian failure 09/22/2016  . Colon polyps 10/29/2014  . Left foot pain 04/15/2014  . Abnormal uterine bleeding 02/24/2014  . Cervical polyp 10/16/2013  . High risk medication use 10/16/2013  . Hyperlipidemia 10/16/2013  . Increased frequency of urination 10/16/2013  . Type 1 diabetes mellitus with other specified complication ([place]) 46/27/0350  . Diabetic keto-acidosis ([place]) 01/01/2013  . Graves disease 01/01/2013  . Hyperglycemia 01/01/2013  . Urine ketones 01/01/2013  . Annual physical exam 08/15/2012  . Pigmented skin lesions 08/15/2012    Mental Status and Behavioral Observations  Andrea [name] presented on time to the present encounter and was alert and generally oriented. Speech was normal in rate, rhythm, volume, and prosody. Self-reported mood was "good" and affect was blunted. Thought process was logical and goal oriented, she seemed a bit more lucid and animated than at the last encounter and thought content was appropriate to the topics discussed. There were no safety concerns identified at today's encounter, such as thoughts of harming self or others.   Plan  Feedback provided regarding the patient's neuropsychological evaluation. Given her updated history, I do not think that she needs to be seen for follow up in 1 to 2 years unless she feels there is further decline or there is concern on the part of her other care providers. Andrea [name] was encouraged to contact me if any questions arise or if further follow up is desired.   Andrea Simas Nicole Kindred, PsyD, [employer] Clinical Neuropsychologist  Service(s) Provided at This Encounter: 40 minutes [phone]; Psychotherapy with patient/family)

## 2020-03-16 NOTE — Patient Instructions (Addendum)
Your performance and presentation on neuropsychological assessment were consistent with diminished performance in mainly "frontal-subcortical" abilities, that is with things involving cognitive efficiency, processing speed, and learning new information. You also had some naming problems and visuospatial problems. These findings are suggestive of at least a mild cognitive impairment level problem. Given your report that you are still able to manage your affairs, I do not think you have a dementia level problem, although further information from an informant would be helpful. I was unable to contact your daughter or your friend Meredith Mody.   The major difference between mild cognitive impairment (MCI) and dementia is in severity and potential prognosis. Once someone reaches a level of severity adequate to be diagnosed with a dementia, there is usually progression over time, though this may be years. On the other hand, mild cognitive impairment, while a significant risk for dementia in future, does not always progress to dementia, and in some instances stays the same or can even revert to normal.  In your case, I am not entirely sure what the cause of your mild cognitive impairment is. Sometimes, when people have seizures they can have lingering negative effects on the brain via decreased blood flow and other mechanisms, and that is a possible cause. Uncontrolled diabetes can also exert interfering effects because your brain does not function as well in the presence of high or low blood glucose. Nevertheless, the possibility that this is due to an underlying medical condition cannot be ruled out and therefore, I would encourage you to return for evaluation if you feel that your condition is worsening or if there is concern on the part of your other medical providers.   As for now, I would suggest that you get help from family members to double check your self-administration of medications and make sure you are taking  them accurately. You said that your grandson is helping and is good with these things, get him to teach you so that you can help yourself.   We discussed the role of diet and lifestyle changes for managing diabetes, and leaving less of the heavy lifting to medications.  You may wish to schedule an appointment with your Primary Care Doctor to follow up regarding your diabetes. You may also benefit from diabetes education if you have not had that recently.   I suggest that you engage in healthy lifestyle changes, including regularly walking, getting exercise as deemed medically appropriate, and eating a metabolically healthy diet.   You are aware of [place] driving laws and that you should not drive until seizure free for 6 months, and ideally until you are cleared by Dr. Delice Lesch.

## 2020-03-17 ENCOUNTER — Other Ambulatory Visit: Payer: Self-pay | Admitting: Internal Medicine

## 2020-03-17 DIAGNOSIS — Z1231 Encounter for screening mammogram for malignant neoplasm of breast: Secondary | ICD-10-CM

## 2020-03-19 ENCOUNTER — Encounter: Payer: Medicaid Other | Admitting: Counselor

## 2020-04-04 ENCOUNTER — Other Ambulatory Visit: Payer: Medicaid Other

## 2020-04-05 ENCOUNTER — Other Ambulatory Visit: Payer: Medicaid Other

## 2020-04-22 ENCOUNTER — Ambulatory Visit
Admission: RE | Admit: 2020-04-22 | Discharge: 2020-04-22 | Disposition: A | Discharge status: Home / Self Care | Payer: Medicaid Other | Source: Ambulatory Visit | Attending: Neurology | Admitting: Neurology

## 2020-04-22 ENCOUNTER — Other Ambulatory Visit: Payer: Self-pay

## 2020-04-22 ENCOUNTER — Other Ambulatory Visit: Payer: Self-pay | Admitting: Neurology

## 2020-04-22 DIAGNOSIS — R569 Unspecified convulsions: Secondary | ICD-10-CM

## 2020-04-22 DIAGNOSIS — R413 Other amnesia: Secondary | ICD-10-CM

## 2020-04-22 DIAGNOSIS — R9089 Other abnormal findings on diagnostic imaging of central nervous system: Secondary | ICD-10-CM

## 2020-04-22 IMAGING — MR MR HEAD W/O CM
12 series · 44 of 48 positions shown · non-contrast
Comparison: [DATE] and prior.

CLINICAL DATA: Seizure

EXAM:
MRI HEAD WITHOUT CONTRAST
TECHNIQUE: Multiplanar, multiecho pulse sequences of the brain and surrounding
structures were obtained without intravenous contrast.

[Series 2: T1 · sagittal · 5.0mm · 0.45mm/px · 3 of 21 slices shown]
[im 1/21]
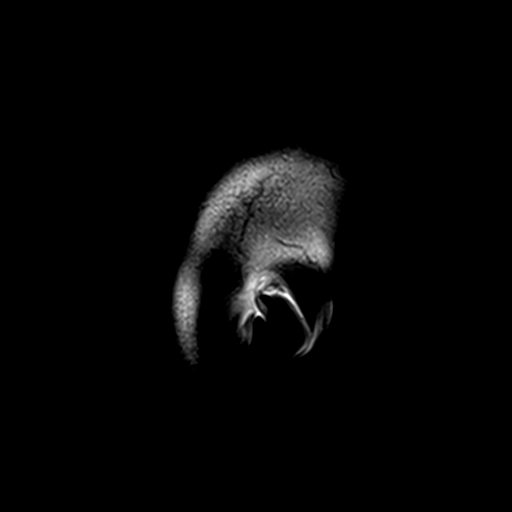
[im 11/21]
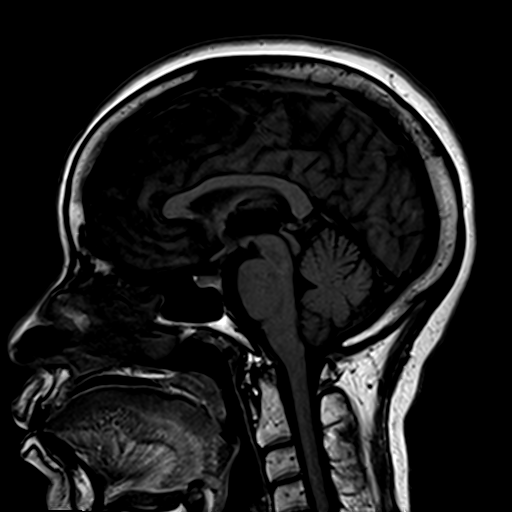
[im 21/21]
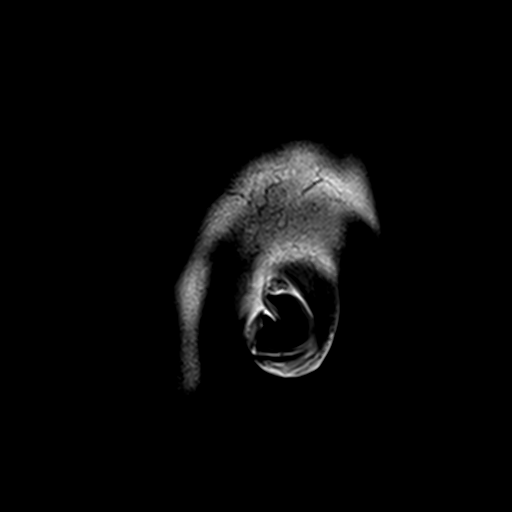

[Series 3: DWI · axial · 3.0mm · 1.80mm/px · z∈[-40,+107]mm · 8 of 100 slices shown (1 of 4)]
[im 1/100]
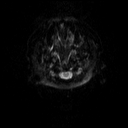
[im 15/100]
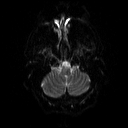
[im 29/100]
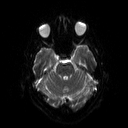
[im 43/100]
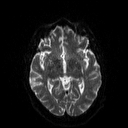
[im 57/100]
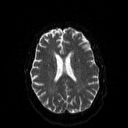
[im 71/100]
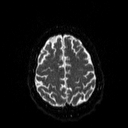
[im 85/100]
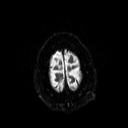
[im 100/100]
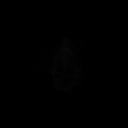

[Series 4: DWI · axial · 3.0mm · 1.80mm/px · z∈[-40,+107]mm · 4 of 48 slices shown (2 of 4)]
[im 1/48]
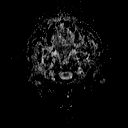
[im 16/48]
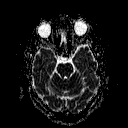
[im 32/48]
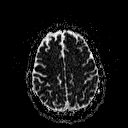
[im 48/48]
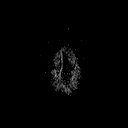

[Series 5: DWI · coronal · 5.0mm · 1.80mm/px · 5 of 67 slices shown (3 of 4)]
[im 1/67]
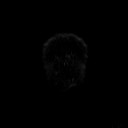
[im 17/67]
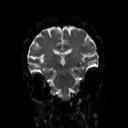
[im 34/67]
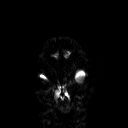
[im 50/67]
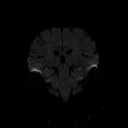
[im 67/67]
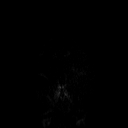

[Series 6: DWI · coronal · 5.0mm · 1.80mm/px · 3 of 34 slices shown (4 of 4)]
[im 1/34]
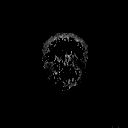
[im 17/34]
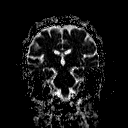
[im 34/34]
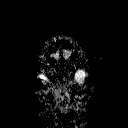

[Series 7: T2 · axial · 5.0mm · 0.51mm/px · z∈[-39,+108]mm · 2 of 22 slices shown (1 of 3)]
[im 1/22]
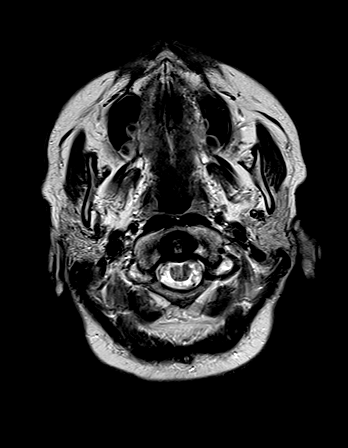
[im 22/22]
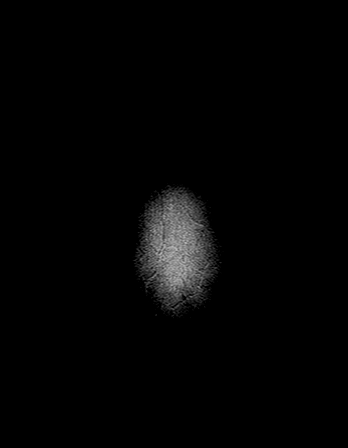

[Series 8: FLAIR · axial · 3.0mm · 0.45mm/px · z∈[-34,+101]mm · 2 of 30 slices shown (1 of 2)]
[im 1/30]
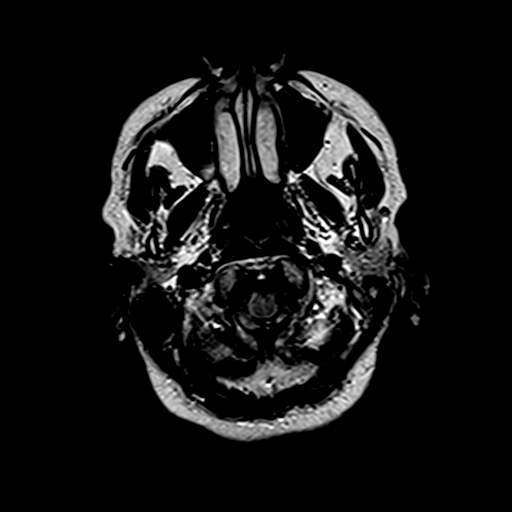
[im 30/30]
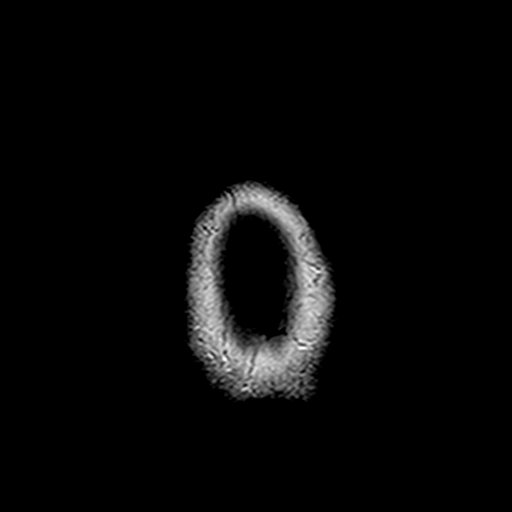

[Series 10: swi_images · axial · 4.0mm · 0.90mm/px · z∈[-36,+103]mm · 3 of 36 slices shown]
[im 1/36]
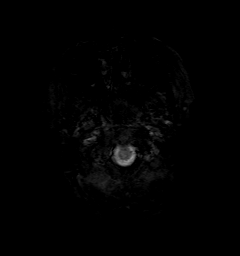
[im 18/36]
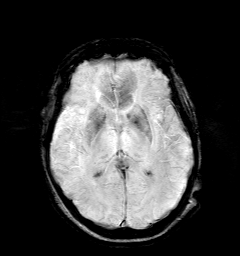
[im 36/36]
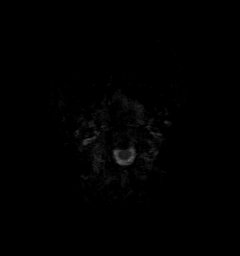

[Series 11: t1_mpr_tra · axial · 1.0mm · 0.71mm/px · z∈[-37,+106]mm · 8 of 144 slices shown]
[im 1/144]
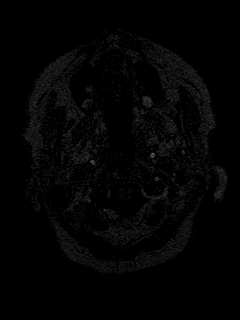
[im 27/144]
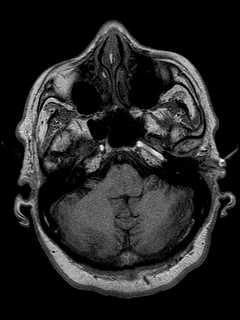
[im 40/144]
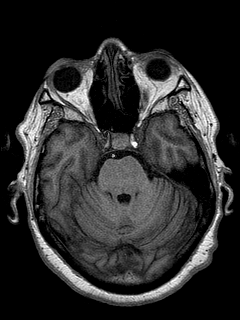
[im 66/144]
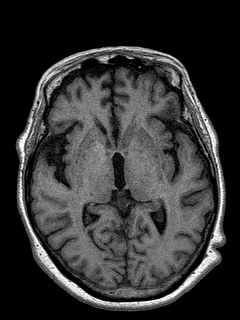
[im 79/144]
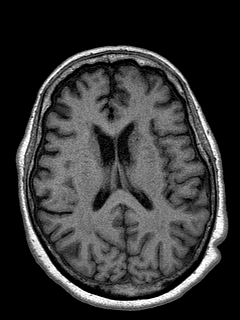
[im 105/144]
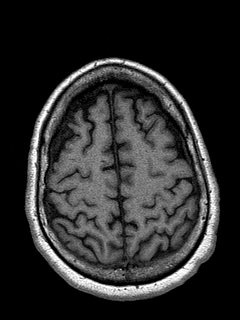
[im 118/144]
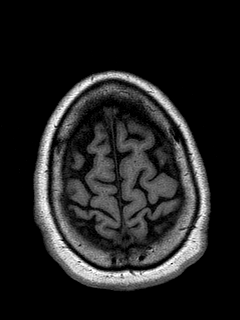
[im 144/144]
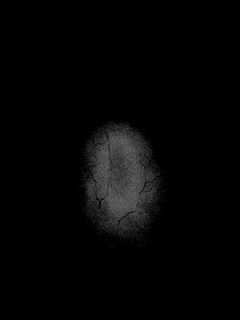

[Series 12: T2 · coronal · 5.0mm · 0.45mm/px · 2 of 25 slices shown (2 of 3)]
[im 1/25]
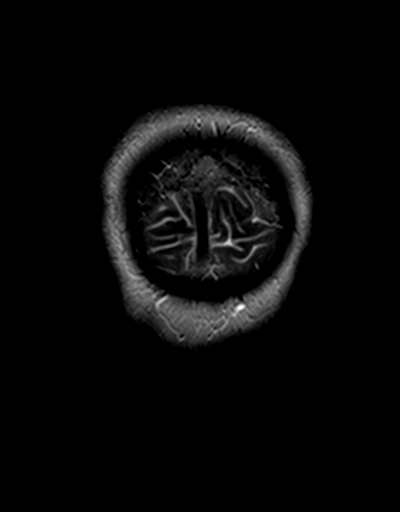
[im 25/25]
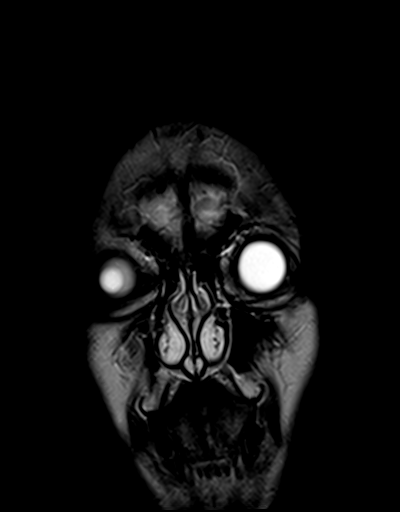

[Series 13: T2 · coronal · 3.0mm · 0.23mm/px · 2 of 30 slices shown (3 of 3)]
[im 1/30]
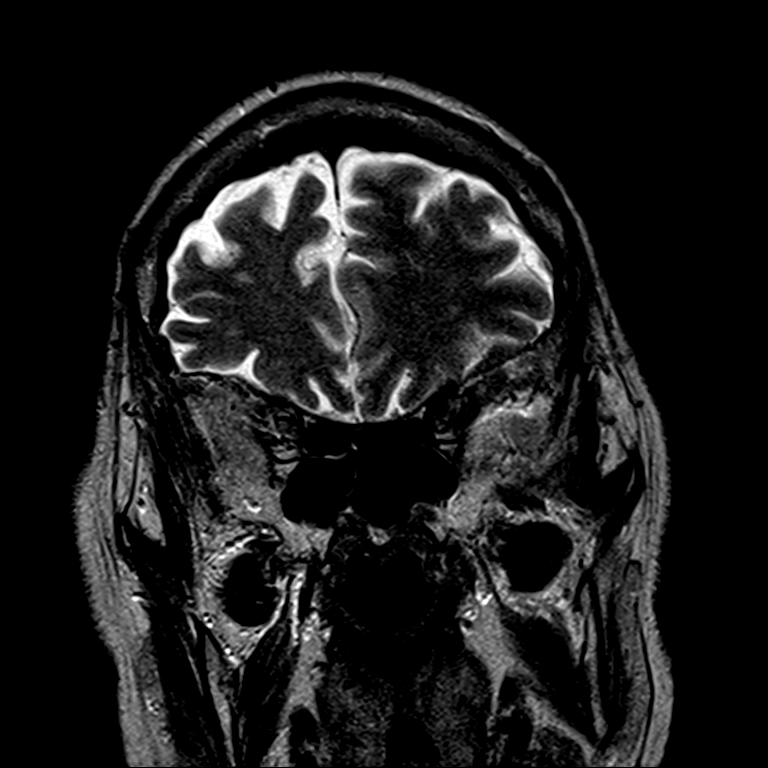
[im 30/30]
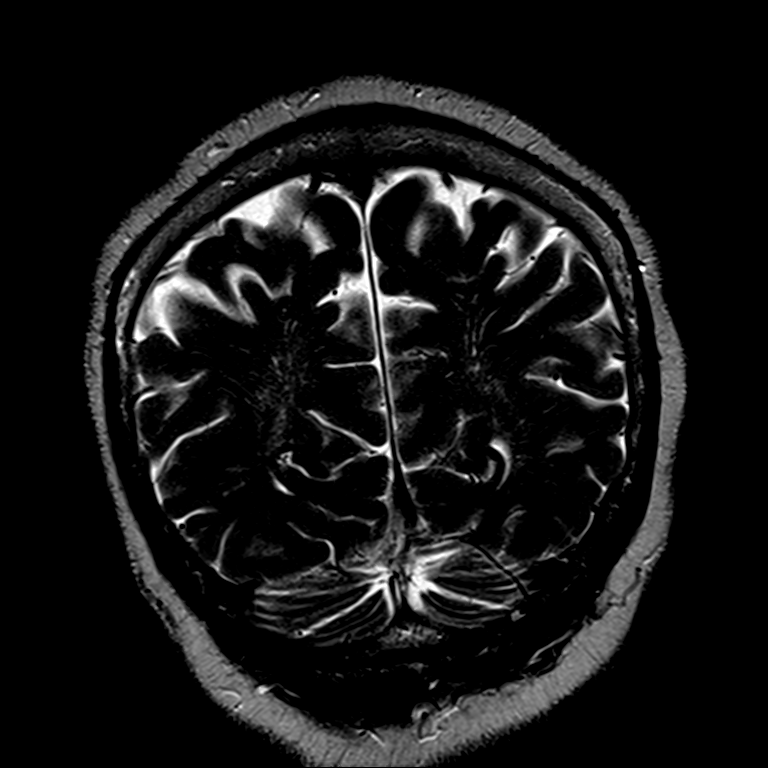

[Series 14: FLAIR · coronal · 3.0mm · 0.35mm/px · 2 of 30 slices shown (2 of 2)]
[im 1/30]
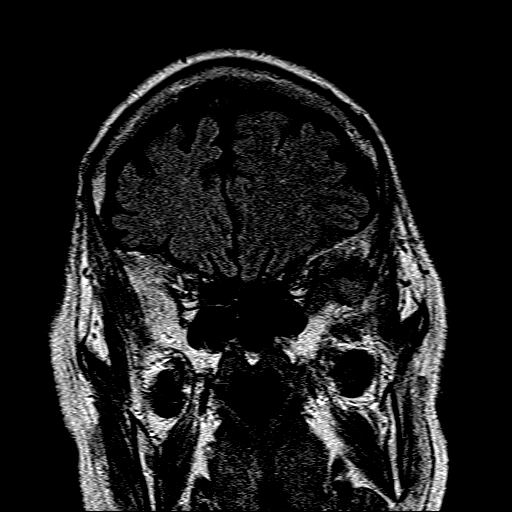
[im 30/30]
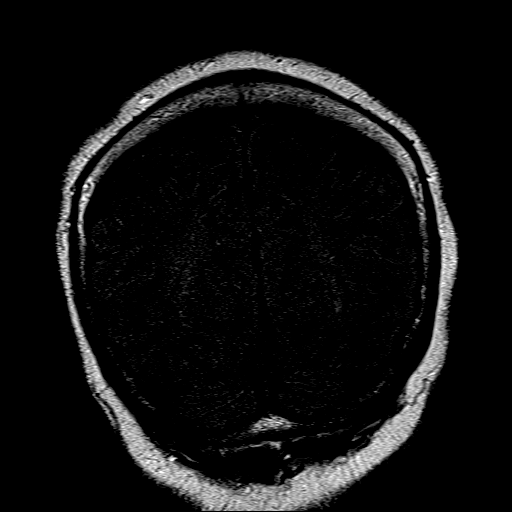

[44 of 48 positions shown; findings below may reference images not displayed]

FINDINGS: Brain: No diffusion-weighted signal abnormality. No intracranial
hemorrhage. No midline shift, ventriculomegaly or extra-axial fluid
collection. No mass lesion.

The bilateral hippocampal complexes are symmetric demonstrating
normal signal intensity and morphology.

Vascular: Normal flow voids.

Skull and upper cervical spine: Normal marrow signal.

Sinuses/Orbits: Sequela of bilateral lens replacement. Minimal
maxillary sinus mucosal thickening. No mastoid effusion.

Other: None.
IMPRESSION: Interval resolution of postictal sequela seen on prior MRI.

Otherwise unremarkable MRI of the brain.

## 2020-04-28 ENCOUNTER — Telehealth: Payer: Self-pay

## 2020-04-28 NOTE — Telephone Encounter (Signed)
-----   Message from Van Clines, MD sent at 04/28/2020  3:50 PM EST ----- Pls let her know the brain MRI looks great, normal now, the changes seen on her prior brain scan are no longer there. Thanks

## 2020-04-28 NOTE — Telephone Encounter (Signed)
Spoke with pt and informed her that brain MRI looks great, normal now, the changes seen on her prior brain scan are no longer there

## 2020-05-10 ENCOUNTER — Ambulatory Visit (INDEPENDENT_AMBULATORY_CARE_PROVIDER_SITE_OTHER): Payer: Medicaid Other | Admitting: Neurology

## 2020-05-10 ENCOUNTER — Encounter: Payer: Self-pay | Admitting: Neurology

## 2020-05-10 ENCOUNTER — Other Ambulatory Visit: Payer: Self-pay

## 2020-05-10 VITALS — BP 150/80 | HR 93 | Ht 61.0 in | Wt 116.0 lb

## 2020-05-10 DIAGNOSIS — R569 Unspecified convulsions: Secondary | ICD-10-CM | POA: Diagnosis not present

## 2020-05-10 DIAGNOSIS — G3184 Mild cognitive impairment, so stated: Secondary | ICD-10-CM

## 2020-05-10 MED ORDER — LEVETIRACETAM 750 MG PO TABS: ORAL_TABLET | ORAL | 11 refills | Status: DC

## 2020-05-10 NOTE — Patient Instructions (Signed)
1. Reduce Levetiracetam (Keppra) 750mg : Take 1 tablet in AM, 2 tablets in PM.   2. Proceed with physical therapy as scheduled  3. Follow-up in 6 months, call for any changes   Seizure Precautions: 1. If medication has been prescribed for you to prevent seizures, take it exactly as directed.  Do not stop taking the medicine without talking to your doctor first, even if you have not had a seizure in a long time.   2. Avoid activities in which a seizure would cause danger to yourself or to others.  Don't operate dangerous machinery, swim alone, or climb in high or dangerous places, such as on ladders, roofs, or girders.  Do not drive unless your doctor says you may.  3. If you have any warning that you may have a seizure, lay down in a safe place where you can't hurt yourself.    4.  No driving for 6 months from last seizure, as per [facility].   Please refer to the following link on the [place] website for more information: http://[email or web address].org/[email or web address]/driving/drivingu.cfm   5.  Maintain good sleep hygiene. Avoid alcohol.  6.  Contact your doctor if you have any problems that may be related to the medicine you are taking.  7.  Call 911 and bring the patient back to the ED if:        A.  The seizure lasts longer than 5 minutes.       B.  The patient doesn't awaken shortly after the seizure  C.  The patient has new problems such as difficulty seeing, speaking or moving  D.  The patient was injured during the seizure  E.  The patient has a temperature over 102 F (39C)  F.  The patient vomited and now is having trouble breathing

## 2020-05-10 NOTE — Progress Notes (Signed)
NEUROLOGY FOLLOW UP OFFICE NOTE  [name] [id number] 04-20-1961  HISTORY OF PRESENT ILLNESS: I had the pleasure of seeing Andrea [name] in follow-up in the neurology clinic on 05/10/2020.  The patient was last seen 4 months ago for new onset seizures in June 2021. She was having episodes of confusion in the setting on hypoglycemia, then had witnessed focal seizures with left facial twitching. She was somnolent on arrival to the ER with MRI brain showing restricted diffusion in the right medial frontal cortex, right insula, right hippocampus. EEG initially showed generalized bifrontal periodic epileptiform discharges, then right frontocentral periodic discharges. She was discharged home on Levetiracetam and clobazam, but ran out and had been off medication for 3 weeks until seen in September 2021 with no seizure recurrence. She was restarted on Levetiracetam 1500mg  BID (reported drowsiness on clobazam). I personally reviewed repeat MRI brain with and without contrast done 03/2020 which was unremarkable with interval resolution of restricted diffusion on the right hemisphere. Her 1-hour EEG in 12/2019 was normal. She underwent Neuropsychological testing in November 2021 due to granddaughter's report of cognitive changes. There was note of cognitive impairment mainly with frontal-subcortical variety with additional visuospatial findings. There was some lateralization (greater right hemispheric problems). 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   Since her last visit, she denies any seizures since June 2021. She lives with her 2 grandchildren, they have not mentioned any staring/unresponsive episodes, she denies any episodes of focal twitching. She denies any headaches, dizziness. She feels her memory is fine, she mostly forgets prior conversations. Her grandson fixes her  pillbox weekly and she takes them by herself, denies missing doses. Her glucose levels have still been a little high. She reports left arm soreness since her first COVID vaccine in December. She has fallen twice, last fall was last night when she slipped, no injuries. She reports sleep is god, she also sleeps a lot during the day, days are turned around sometimes. She goes to bed late at 12am or 2am, waking up at 5am to take her medications, then going back to sleep from 7am to 11am. She thinks her medications are making her drowsy. She has home health coming in, PT has also been set up. She reports difficulty getting up after sitting for prolonged periods, no pain or weakness, mostly balance issues. She can barely walk or take care of herself, scared she will fall down. She is able to bathe and dress herself. She does not drive.   History on Initial Assessment 01/09/2020: This is a pleasant 60 year old right-handed woman with a history of type I DM, hypothyroidism, presenting after hospital discharge for new onset seizures. 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 [name] reports that she started having episodes of confusion earlier in June, they were back and forth to the hospital, she was in the ER for hypoglycemia of 24 on 6/19, non-verbal with generalized weakness. She was back in the ER on 6/20 again hypoglycemic (glucose 30) with left-sided weakness. Family had not witnessed any seizure activity, however EMS on scene noted 2 focal seizures, given IV Versed and dextrose. She was somnolent and not following commands on arrival. MRI brain showed restricted diffusion in the right medial frontal cortex, right insual, and right hippocampus, no abnormal enhancement. Her prolonged EEG showed generalized, maximal bifrontal periodic epileptiform discharges initially, then  right frontocentral periodic discharges. She had multiple episodes of left facial twitching with  generalized theta-alpha activity. Seizures improved with Levetiracetam and clobazam, she continued to have left sensory hemineglect, EEG continued to show LPDs on the right hemisphere. She then had recurrence of facial twitching and had a malignancy workup, CT abdomen/pelvis noted sigmoid colonic wall thickening which could represent focal colitis versus colonic neoplasm. She had LP which was overall unremarkable except for elevated glucose. Pending autoimmune and paraneoplastic [facility] panel, she was started empirically on Solumedrol for 5 days. Panel has come back negative. She was discharged home on Levetiracetam 1500mg  BID and Clobazam 5mg  BID. They deny any further seizures since hospital discharge. She missed 2 appointments scheduled and follows up today, they report running out of seizure medications 3 weeks ago with no seizure recurrence. Glucose levels have stabilized, she has not had significant hypoglycemia like before. She denies any facial twitching, no focal numbness/tingling/weakness. [name] has not witnessed any staring/unresponsive episodes. Her main concern today is right hip/thigh pain, which started before her June admission. [name] is asking for memory evaluation. She started having mild memory changes prior to June, however memory changes have progressively worsened since then. She gets confused with dates and repeats herself several times. She says "sometimes I forget." She stopped driving at the beginning of the year because "I was just tired." She denied getting lost driving. She takes her own medications in the morning, her grandson helps her for the evening medications because she may forget. She remembers to pay the rent but gets confused, so [name] helps. She does not cook. 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 Her daughter had seizures and passed away. She had a normal birth and early development.  There is  no history of febrile convulsions, CNS infections such as meningitis/encephalitis, significant traumatic brain injury, neurosurgical procedures.   PAST MEDICAL HISTORY: Past Medical History:  Diagnosis Date  . Diabetes mellitus without complication ([place])   . Hyperlipidemia   . Hypothyroidism   . Thyroid disease     MEDICATIONS: Current Outpatient Medications on File Prior to Visit  Medication Sig Dispense Refill  . aspirin EC 81 MG tablet Take 81 mg by mouth daily.    . Cholecalciferol 125 MCG (5000 UT) TABS Take 5,000 Units by mouth daily.    . insulin aspart (NOVOLOG) 100 UNIT/ML injection Inject 0-6 Units into the skin 3 (three) times daily with meals. 10 mL 11  . insulin detemir (LEVEMIR) 100 UNIT/ML injection Inject 0.1 mLs (10 Units total) into the skin 2 (two) times daily. 10 mL 11  . levETIRAcetam (KEPPRA) 750 MG tablet Take 2 tablets (1,500 mg total) by mouth 2 (two) times daily. 60 tablet 11  . levonorgestrel (MIRENA) 20 MCG/24HR IUD 1 each by Intrauterine route once.     Marland Kitchen levothyroxine (SYNTHROID) 150 MCG tablet Take 150 mcg by mouth every morning.    Marland Kitchen liothyronine (CYTOMEL) 5 MCG tablet Take 5 mcg by mouth daily. Take in addition to levothyroxine.    Marland Kitchen lisinopril (PRINIVIL,ZESTRIL) 20 MG tablet Take 20 mg by mouth daily.    . metoprolol tartrate (LOPRESSOR) 50 MG tablet Take 1 tablet (50 mg total) by mouth 2 (two) times daily.    . Multiple Vitamin (MULTIVITAMIN WITH MINERALS) TABS tablet Take 1 tablet by mouth daily.    . potassium chloride SA (KLOR-CON) 20 MEQ tablet Take 1 tablet (  20 mEq total) by mouth daily. (Patient taking differently: Take 20 mEq by mouth 2 (two) times daily.) 30 tablet 0  . rosuvastatin (CRESTOR) 20 MG tablet Take 20 mg by mouth at bedtime.     . senna-docusate (SENOKOT S) 8.6-50 MG tablet Take 2 tablets by mouth at bedtime. 60 tablet 1  . tolterodine (DETROL LA) 4 MG 24 hr capsule Take 1 capsule (4 mg total) by mouth daily. 30 capsule 2  . feeding  supplement, ENSURE ENLIVE, (ENSURE ENLIVE) LIQD Take 237 mLs by mouth 3 (three) times daily between meals. (Patient not taking: Reported on 05/10/2020) 237 mL 12  . [DISCONTINUED] pravastatin (PRAVACHOL) 40 MG tablet Take 40 mg by mouth every evening.      No current facility-administered medications on file prior to visit.    ALLERGIES: Allergies  Allergen Reactions  . Latex Hives    FAMILY HISTORY: Family History  Problem Relation Age of Onset  . Heart disease Father   . Breast cancer Neg Hx     SOCIAL HISTORY: Social History   Socioeconomic History  . Marital status: Single    Spouse name: Not on file  . Number of children: Not on file  . Years of education: Not on file  . Highest education level: Not on file  Occupational History  . Not on file  Tobacco Use  . Smoking status: Never Smoker  . Smokeless tobacco: Never Used  Vaping Use  . Vaping Use: Never used  Substance and Sexual Activity  . Alcohol use: No  . Drug use: No  . Sexual activity: Not on file  Other Topics Concern  . Not on file  Social History Narrative   Right handed    Lives with family    Social Determinants of Health   Financial Resource Strain: Not on file  Food Insecurity: Not on file  Transportation Needs: Not on file  Physical Activity: Not on file  Stress: Not on file  Social Connections: Not on file  Intimate Partner Violence: Not on file     PHYSICAL EXAM: Vitals:   05/10/20 1418  BP: (!) 150/80  Pulse: 93  SpO2: 100%   General: No acute distress Head:  Normocephalic/atraumatic Skin/Extremities: No rash, no edema Neurological Exam: alert and oriented to person, place, and time. No aphasia or dysarthria. Fund of knowledge is appropriate.  Recent and remote memory are intact.  Attention and concentration are normal.   Cranial nerves: Pupils equal, round. Extraocular movements intact with no nystagmus. Visual fields full.  No facial asymmetry.  Motor: Bulk and tone normal,  muscle strength 5/5 throughout with no pronator drift.   Finger to nose testing intact.  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 EEG in June 2021 captured multiple episodes of left facial twitching with electrographic correlate. With clinical improvement, EEG continued to show lateralized periodic discharges over the right hemisphere, maximal over the right medial frontocentral region. Her MRI brain on 6/21 showed restricted diffusion in the right medical frontal cortex, right insula, and right hippocampus. LP unremarkable, autoimmune/paraneoplastic panel negative. She was treated empirically with 5 days of IV Solumedrol. She has been seizure-free since June 2021, repeat MRI brain in 03/2020 showed interval resolution of right hemisphere changes, EEG was normal. She is reporting drowsiness which may be  due to Levetiracetam, reduce to 500mg  in AM, 1000mg  in PM. 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 We discussed continued control of vascular risk factors. She is having more balance issues, likely due to neuropathy, proceed with physical therapy as planned. She does not drive. Follow-up in 6 months, call for any changes.     Thank you for allowing me to participate in her care.  Please do not hesitate to call for any questions or concerns.   Ellouise Newer, M.D.   CC: D. Simon Rhein

## 2020-05-31 ENCOUNTER — Other Ambulatory Visit: Payer: Self-pay | Admitting: Neurology

## 2020-06-01 ENCOUNTER — Other Ambulatory Visit: Payer: Self-pay

## 2020-06-01 ENCOUNTER — Telehealth: Payer: Self-pay | Admitting: Neurology

## 2020-06-01 NOTE — Telephone Encounter (Signed)
Per patient has her medication and does not need [place] to do anything

## 2020-06-01 NOTE — Telephone Encounter (Signed)
Patient called in and left a message stating she needs a refill of her seizure medication sent in to [facility] on [address] in [place]. [facility] is telling her we have to send something to them.

## 2020-07-08 ENCOUNTER — Other Ambulatory Visit: Payer: Self-pay

## 2020-07-08 ENCOUNTER — Encounter: Payer: Self-pay | Admitting: Emergency Medicine

## 2020-07-08 ENCOUNTER — Ambulatory Visit
Admission: EM | Admit: 2020-07-08 | Discharge: 2020-07-08 | Disposition: A | Discharge status: Home / Self Care | Payer: Medicaid Other | Attending: Physician Assistant | Admitting: Physician Assistant

## 2020-07-08 ENCOUNTER — Ambulatory Visit (INDEPENDENT_AMBULATORY_CARE_PROVIDER_SITE_OTHER): Payer: Medicaid Other

## 2020-07-08 DIAGNOSIS — M19019 Primary osteoarthritis, unspecified shoulder: Secondary | ICD-10-CM | POA: Diagnosis not present

## 2020-07-08 DIAGNOSIS — M25512 Pain in left shoulder: Secondary | ICD-10-CM

## 2020-07-08 IMAGING — CR DG SHOULDER 2+V*L*
4 series · 4 of 4 positions shown · non-contrast
Comparison: None.

CLINICAL DATA: Left shoulder pain for 3 weeks. Pain onset after
second COVID vaccine.

EXAM:
LEFT SHOULDER - 2+ VIEW

[shoulder grashey]
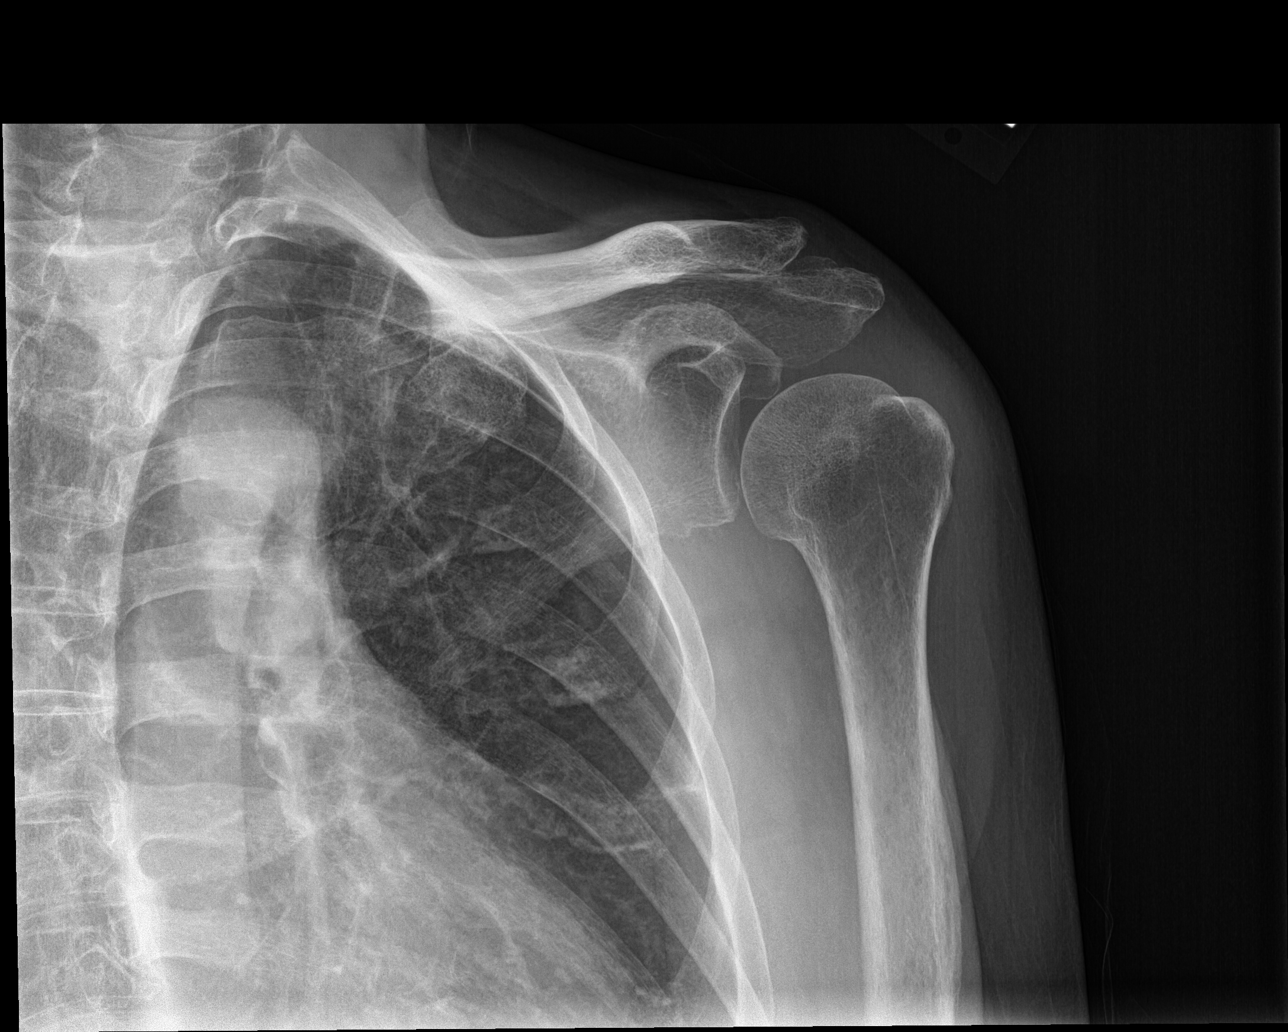

[shoulder y view (1 of 2)]
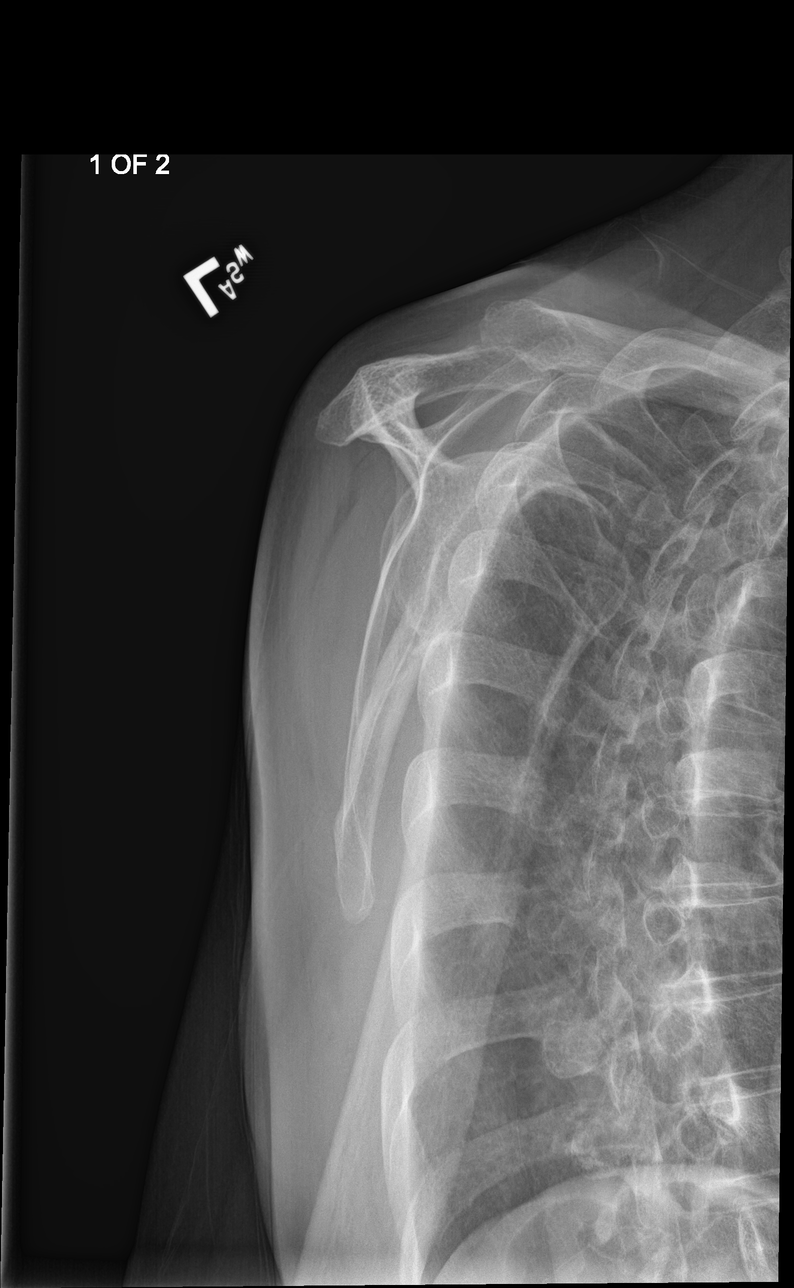

[shoulder axial]
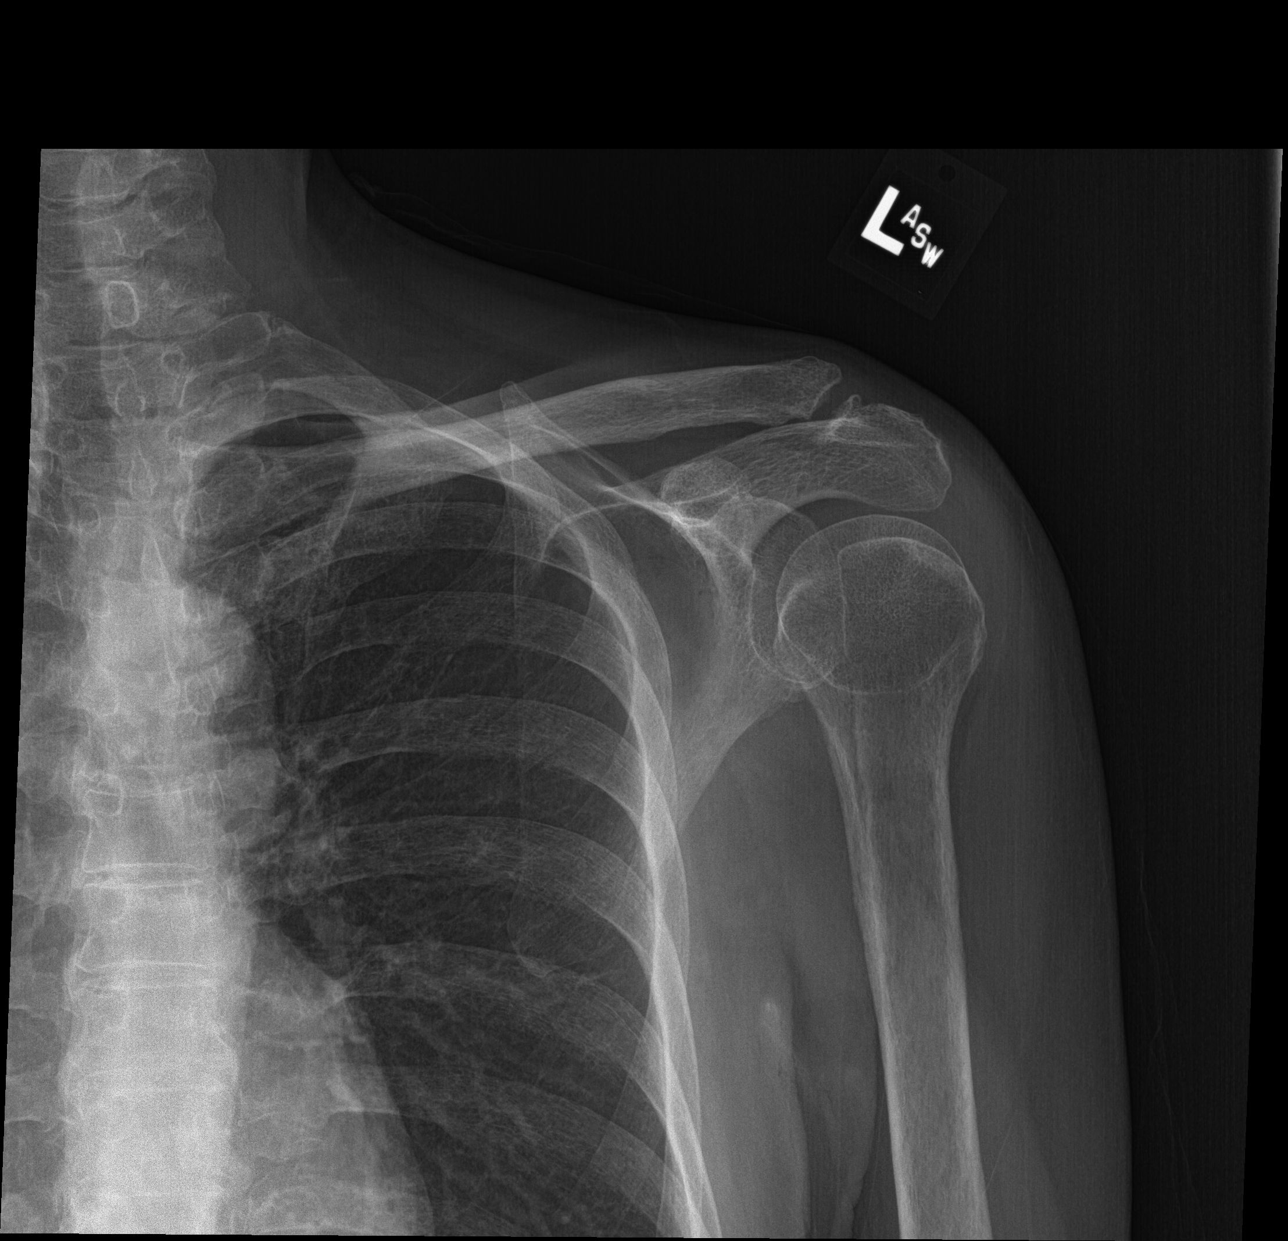

[shoulder y view (2 of 2)]
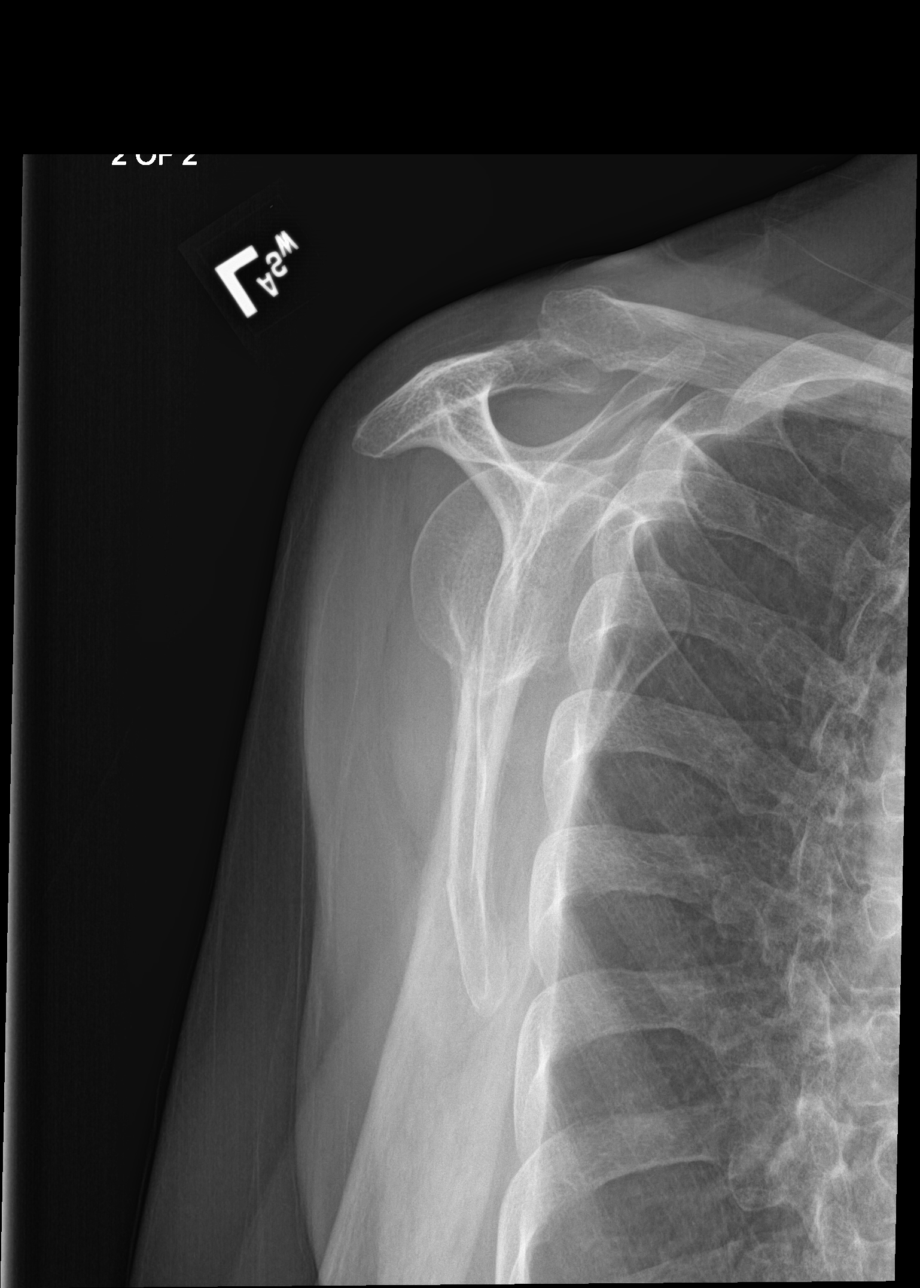

[4 of 4 positions shown; findings below may reference images not displayed]

FINDINGS: There is no evidence of fracture or dislocation. Minor
acromioclavicular degenerative change with small inferiorly directed
spurs. No evidence of a vascular necrosis or focal bone lesion. Soft
tissues are unremarkable.
IMPRESSION: 1. No acute abnormality.
2. Minor acromioclavicular degenerative change with small inferiorly
directed osteophytes.

## 2020-07-08 MED ORDER — MELOXICAM 15 MG PO TABS: 15.0000 mg | ORAL_TABLET | Freq: Every day | ORAL | 0 refills | Status: AC

## 2020-07-08 NOTE — ED Triage Notes (Signed)
Pt c/o left shoulder pain that radiates down her arm into her wrist. She states it started after having the 2nd covid vaccine about a 3 weeks ago. Denies injury.

## 2020-07-08 NOTE — ED Provider Notes (Signed)
[facility]    CSN: [id number] Arrival date & time: 07/08/20  1659      History   Chief Complaint Chief Complaint  Patient presents with  . Shoulder Pain    HPI Andrea [name] is a 60 y.o. female presenting for left shoulder pain x3 weeks.  Patient believes the pain is worsening.  She says that the pain is constant and aching.  She says that sometimes it does shoot pain down her arm to her hands.  She admits to occasional tingling in her forearm.  Pain is approximately 8 out of 10 and not relieved with the Tylenol.  Admits to increased pain when reaching over her head or trying to reach behind her back.  Patient says that the pain seemed to come on after she had her second COVID-19 vaccination.  Denies any history of shoulder problems or arthritis that she is aware of.  She denies any pain in her chest or back.  No palpitations, dizziness, weakness or breathing difficulty.  She does have a history of type 1 diabetes, hyperlipidemia, thyroid disease, seizure disorder, and abnormal uterine bleeding.  She has no other complaints or concerns today.  HPI  Past Medical History:  Diagnosis Date  . Diabetes mellitus without complication ([place])   . Hyperlipidemia   . Hypothyroidism   . Thyroid disease     Patient Active Problem List   Diagnosis Date Noted  . Goals of care, counseling/discussion   . Palliative care by specialist   . Protein-calorie malnutrition, severe 11/04/2019  . Seizure ([place]) 10/19/2019  . Hypoglycemia 10/19/2019  . Hypokalemia 10/19/2019  . Back pain 06/14/2017  . Fibroids 06/14/2017  . 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 Hypertension associated with diabetes ([place]) 06/14/2017  . Hypothyroid 06/14/2017  . Menometrorrhagia 06/14/2017  . Ovarian failure 09/22/2016  . Colon polyps 10/29/2014  . Left foot pain 04/15/2014  . Abnormal uterine bleeding 02/24/2014  . Cervical polyp 10/16/2013  . High risk medication use 10/16/2013  . Hyperlipidemia 10/16/2013  .  Increased frequency of urination 10/16/2013  . Type 1 diabetes mellitus with other specified complication ([place]) 22/29/7989  . Diabetic keto-acidosis ([place]) 01/01/2013  . Graves disease 01/01/2013  . Hyperglycemia 01/01/2013  . Urine ketones 01/01/2013  . Annual physical exam 08/15/2012  . Pigmented skin lesions 08/15/2012    Past Surgical History:  Procedure Laterality Date  . BIOPSY  11/02/2019   Procedure: BIOPSY;  Surgeon: [name], MD;  Location: [facility] ENDOSCOPY;  Service: Gastroenterology;;  . COLONOSCOPY WITH PROPOFOL N/A 11/02/2019   Procedure: COLONOSCOPY WITH PROPOFOL;  Surgeon: [name], MD;  Location: [place];  Service: Gastroenterology;  Laterality: N/A;  . INCISION AND DRAINAGE ABSCESS Right 06/15/2017   Procedure: INCISION AND DRAINAGE ABSCESS;  Surgeon: Clayburn Pert, MD;  Location: [facility] ORS;  Service: General;  Laterality: Right;  . POLYPECTOMY  11/02/2019   Procedure: POLYPECTOMY;  Surgeon: [name], MD;  Location: [facility] ENDOSCOPY;  Service: Gastroenterology;;    OB History   No obstetric history on file.      Home Medications    Prior to Admission medications   Medication Sig Start Date End Date Taking? Authorizing Provider  aspirin EC 81 MG tablet Take 81 mg by mouth daily.   Yes [provider]  Cholecalciferol 125 MCG (5000 UT) TABS Take 5,000 Units by mouth daily.   Yes [provider]  insulin aspart (NOVOLOG) 100 UNIT/ML injection Inject 0-6 Units into the skin 3 (three) times daily with meals.  11/06/19  Yes Amin, Ankit Chirag, MD  insulin detemir (LEVEMIR) 100 UNIT/ML injection Inject 0.1 mLs (10 Units total) into the skin 2 (two) times daily. 11/06/19  Yes Amin, Ankit Chirag, MD  levETIRAcetam (KEPPRA) 750 MG tablet TAKE 2 TABLETS (1,500 MG TOTAL) BY MOUTH 2 (TWO) TIMES DAILY. 06/01/20  Yes Cameron Sprang, MD  levonorgestrel [facility]) 20 MCG/24HR IUD 1 each by Intrauterine route once.    Yes [provider]  levothyroxine  (SYNTHROID) 150 MCG tablet Take 150 mcg by mouth every morning. 06/02/19  Yes [provider]  liothyronine (CYTOMEL) 5 MCG tablet Take 5 mcg by mouth daily. Take in addition to levothyroxine.   Yes [provider]  lisinopril (PRINIVIL,ZESTRIL) 20 MG tablet Take 20 mg by mouth daily.   Yes [provider]  meloxicam (MOBIC) 15 MG tablet Take 1 tablet (15 mg total) by mouth daily. 07/08/20 08/07/20 Yes Laurene Footman B, PA-C  metoprolol tartrate (LOPRESSOR) 50 MG tablet Take 1 tablet (50 mg total) by mouth 2 (two) times daily. 11/09/19  Yes Amin, Jeanella Flattery, MD  Multiple Vitamin (MULTIVITAMIN WITH MINERALS) TABS tablet Take 1 tablet by mouth daily. 11/06/19  Yes Amin, Ankit Chirag, MD  potassium chloride SA (KLOR-CON) 20 MEQ tablet Take 1 tablet (20 mEq total) by mouth daily. Patient taking differently: Take 20 mEq by mouth 2 (two) times daily. 10/08/19  Yes Earleen Newport, MD  rosuvastatin (CRESTOR) 20 MG tablet Take 20 mg by mouth at bedtime.  08/04/19  Yes [provider]  feeding supplement, ENSURE ENLIVE, (ENSURE ENLIVE) LIQD Take 237 mLs by mouth 3 (three) times daily between meals. Patient not taking: No sig reported 11/06/19   Damita Lack, MD  tolterodine (DETROL LA) 4 MG 24 hr capsule Take 1 capsule (4 mg total) by mouth daily. 10/08/19 10/07/20  Earleen Newport, MD  pravastatin (PRAVACHOL) 40 MG tablet Take 40 mg by mouth every evening.   08/16/19  [provider]    Family History Family History  Problem Relation Age of Onset  . Heart disease Father   . Breast cancer Neg Hx     Social History Social History   Tobacco Use  . Smoking status: Never Smoker  . Smokeless tobacco: Never Used  Vaping Use  . Vaping Use: Never used  Substance Use Topics  . Alcohol use: No  . Drug use: No     Allergies   Latex   Review of Systems Review of Systems  Constitutional: Negative for fatigue and fever.  Respiratory: Negative for  shortness of breath.   Cardiovascular: Negative for chest pain and palpitations.  Gastrointestinal: Negative for abdominal pain, nausea and vomiting.  Musculoskeletal: Positive for arthralgias. Negative for back pain, joint swelling, myalgias, neck pain and neck stiffness.  Skin: Negative for color change, rash and wound.  Neurological: Negative for dizziness, weakness, numbness and headaches.     Physical Exam Triage Vital Signs ED Triage Vitals  Enc Vitals Group     BP 07/08/20 1712 (!) 143/80     Pulse Rate 07/08/20 1712 93     Resp 07/08/20 1712 18     Temp 07/08/20 1712 98.2 F (36.8 C)     Temp Source 07/08/20 1712 Oral     SpO2 07/08/20 1712 100 %     Weight 07/08/20 1710 115 lb 15.4 oz (52.6 kg)     Height 07/08/20 1710 5\' 1"  (1.549 m)     Head Circumference --  Peak Flow --      Pain Score 07/08/20 1710 8     Pain Loc --      Pain Edu? --      Excl. in [place]? --    No data found.  Updated Vital Signs BP (!) 143/80 (BP Location: Left Arm)   Pulse 93   Temp 98.2 F (36.8 C) (Oral)   Resp 18   Ht 5\' 1"  (1.549 m)   Wt 115 lb 15.4 oz (52.6 kg)   SpO2 100%   BMI 21.91 kg/m       Physical Exam Vitals and nursing note reviewed.  Constitutional:      General: She is not in acute distress.    Appearance: Normal appearance. She is not ill-appearing or toxic-appearing.  HENT:     Head: Normocephalic and atraumatic.     Nose: Nose normal.     Mouth/Throat:     Mouth: Mucous membranes are moist.     Pharynx: Oropharynx is clear.  Eyes:     General: No scleral icterus.       Right eye: No discharge.        Left eye: No discharge.     Conjunctiva/sclera: Conjunctivae normal.  Cardiovascular:     Rate and Rhythm: Normal rate and regular rhythm.     Heart sounds: Normal heart sounds.  Pulmonary:     Effort: Pulmonary effort is normal. No respiratory distress.     Breath sounds: Normal breath sounds. No wheezing, rhonchi or rales.  Chest:     Chest wall: No  tenderness.  Musculoskeletal:     Right shoulder: Normal strength. Normal pulse.     Left shoulder: Tenderness (TTP along AC joint, biceps groove, and lateral deltoid) present. No swelling or crepitus. Decreased range of motion (decreased abduction and flexion beyond 90 degrees). Normal strength. Normal pulse.     Cervical back: Neck supple.  Skin:    General: Skin is dry.  Neurological:     General: No focal deficit present.     Mental Status: She is alert. Mental status is at baseline.     Motor: No weakness.     Gait: Gait normal.  Psychiatric:        Mood and Affect: Mood normal.        Behavior: Behavior normal.        Thought Content: Thought content normal.      UC Treatments / Results  Labs (all labs ordered are listed, but only abnormal results are displayed) Labs Reviewed - No data to display  EKG   Radiology DG Shoulder Left  Result Date: 07/08/2020 CLINICAL DATA:  Left shoulder pain for 3 weeks. Pain onset after second COVID vaccine. EXAM: LEFT SHOULDER - 2+ VIEW COMPARISON:  None. FINDINGS: There is no evidence of fracture or dislocation. Minor acromioclavicular degenerative change with small inferiorly directed spurs. No evidence of a vascular necrosis or focal bone lesion. Soft tissues are unremarkable. IMPRESSION: 1. No acute abnormality. 2. Minor acromioclavicular degenerative change with small inferiorly directed osteophytes. Electronically Signed   By: Keith Rake M.D.   On: 07/08/2020 18:44    Procedures Procedures (including critical care time)  Medications Ordered in UC Medications - No data to display  Initial Impression / Assessment and Plan / UC Course  I have reviewed the triage vital signs and the nursing notes.  Pertinent labs & imaging results that were available during my care of the patient were reviewed by me  and considered in my medical decision making (see chart for details).    60 year old female presenting for left shoulder pain x3  weeks.  Admits to radiating pain down the arm with occasional tingling of the forearm.  Denies any red flag signs or symptoms.  Pain not improved by Tylenol.  Pain appears to be worsening.  X-ray of shoulder obtained today to assess for underlying arthritis or other abnormality that could be contributing to her pain.  X-ray independently reviewed by me.  X-ray overread from radiologist states there is some AC degenerative joint changes.  Discussed results with patient and advised her that part of her pain could be due to flareup of arthritis.  Advised supportive care with Tylenol for pain relief.  Also prescribed meloxicam.  Advised icing the shoulder and trying to exercise it.  Advised if she is not getting better than she should follow-up with Ortho as she may need an injection into the joint and/or possible physical therapy.  Advised to follow-up with our clinic as needed.  Final Clinical Impressions(s) / UC Diagnoses   Final diagnoses:  Acute pain of left shoulder  AC joint arthropathy     Discharge Instructions     SHOULDER PAIN. Your x-ray shows a little bit of arthritis in the shoulder. Supportive care advised. Stressed avoiding painful activities . Reviewed RICE guidelines. Use medications as directed, including NSAIDs. If no NSAIDs have been prescribed for you today, you may take Aleve or Motrin over the counter. May use Tylenol in between doses of NSAIDs.  If no improvement in the next 1-2 weeks, f/u with PCP or return to our office for reexamination, and please feel free to call or return at any time for any questions or concerns you may have and we will be happy to help you!      If not improving over next couple of weeks, may need physical therapy. Follow up with PCP about this.     ED Prescriptions    Medication Sig Dispense Auth. Provider   meloxicam (MOBIC) 15 MG tablet Take 1 tablet (15 mg total) by mouth daily. 30 tablet Danton Clap, PA-C     I have reviewed the  PDMP during this encounter.   Danton Clap, PA-C 07/08/20 [phone]

## 2020-07-08 NOTE — Discharge Instructions (Addendum)
SHOULDER PAIN. Your x-ray shows a little bit of arthritis in the shoulder. Supportive care advised. Stressed avoiding painful activities . Reviewed RICE guidelines. Use medications as directed, including NSAIDs. If no NSAIDs have been prescribed for you today, you may take Aleve or Motrin over the counter. May use Tylenol in between doses of NSAIDs.  If no improvement in the next 1-2 weeks, f/u with PCP or return to our office for reexamination, and please feel free to call or return at any time for any questions or concerns you may have and we will be happy to help you!      If not improving over next couple of weeks, may need physical therapy. Follow up with PCP about this.

## 2020-07-21 ENCOUNTER — Encounter: Payer: Self-pay | Admitting: Emergency Medicine

## 2020-07-21 ENCOUNTER — Ambulatory Visit
Admission: EM | Admit: 2020-07-21 | Discharge: 2020-07-21 | Disposition: A | Discharge status: Home / Self Care | Payer: Medicaid Other | Attending: Physician Assistant | Admitting: Physician Assistant

## 2020-07-21 ENCOUNTER — Other Ambulatory Visit: Payer: Self-pay

## 2020-07-21 DIAGNOSIS — H9201 Otalgia, right ear: Secondary | ICD-10-CM

## 2020-07-21 DIAGNOSIS — H6121 Impacted cerumen, right ear: Secondary | ICD-10-CM | POA: Diagnosis not present

## 2020-07-21 MED ORDER — CIPROFLOXACIN-DEXAMETHASONE 0.3-0.1 % OT SUSP: 4.0000 [drp] | Freq: Two times a day (BID) | OTIC | 0 refills | Status: AC

## 2020-07-21 NOTE — ED Triage Notes (Signed)
Pt c/o right ear pain. Started about ago. Denies fever or cold symptoms.

## 2020-07-21 NOTE — Discharge Instructions (Addendum)
-  Earwax cleaned out -There is some redness of your ear canal which could represent infection. I have sent in an antibiotic ear drop -Use warm compresses, Motrin and Tylenol for pain relief -Follow up if not improving after the medication or for any new/worsening symptoms.

## 2020-07-21 NOTE — ED Provider Notes (Signed)
[facility]    CSN: [id number] Arrival date & time: 07/21/20  1821      History   Chief Complaint Chief Complaint  Patient presents with  . Otalgia    right    HPI Andrea [name] is a 60 y.o. female presenting for right-sided ear pain x1 week.  She denies any drainage from the ear or hearing loss.  Patient denies any fever, headaches, dizziness, weakness, sore throat, nasal congestion, or cough.  Has not taken anything for pain relief.  Denies similar problem the past.  No injury to the ear.  Not taking any medications for symptoms currently.  No other complaints or concerns.  HPI  Past Medical History:  Diagnosis Date  . Diabetes mellitus without complication ([place])   . Hyperlipidemia   . Hypothyroidism   . Thyroid disease     Patient Active Problem List   Diagnosis Date Noted  . Goals of care, counseling/discussion   . Palliative care by specialist   . Protein-calorie malnutrition, severe 11/04/2019  . Seizure ([place]) 10/19/2019  . Hypoglycemia 10/19/2019  . Hypokalemia 10/19/2019  . Back pain 06/14/2017  . Fibroids 06/14/2017  . 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 Hypertension associated with diabetes ([place]) 06/14/2017  . Hypothyroid 06/14/2017  . Menometrorrhagia 06/14/2017  . Ovarian failure 09/22/2016  . Colon polyps 10/29/2014  . Left foot pain 04/15/2014  . Abnormal uterine bleeding 02/24/2014  . Cervical polyp 10/16/2013  . High risk medication use 10/16/2013  . Hyperlipidemia 10/16/2013  . Increased frequency of urination 10/16/2013  . Type 1 diabetes mellitus with other specified complication ([place]) 76/81/1572  . Diabetic keto-acidosis ([place]) 01/01/2013  . Graves disease 01/01/2013  . Hyperglycemia 01/01/2013  . Urine ketones 01/01/2013  . Annual physical exam 08/15/2012  . Pigmented skin lesions 08/15/2012    Past Surgical History:  Procedure Laterality Date  . BIOPSY  11/02/2019   Procedure: BIOPSY;  Surgeon: [name], MD;  Location: [facility]  ENDOSCOPY;  Service: Gastroenterology;;  . COLONOSCOPY WITH PROPOFOL N/A 11/02/2019   Procedure: COLONOSCOPY WITH PROPOFOL;  Surgeon: [name], MD;  Location: [place];  Service: Gastroenterology;  Laterality: N/A;  . INCISION AND DRAINAGE ABSCESS Right 06/15/2017   Procedure: INCISION AND DRAINAGE ABSCESS;  Surgeon: Clayburn Pert, MD;  Location: [facility] ORS;  Service: General;  Laterality: Right;  . POLYPECTOMY  11/02/2019   Procedure: POLYPECTOMY;  Surgeon: [name], MD;  Location: [facility] ENDOSCOPY;  Service: Gastroenterology;;    OB History   No obstetric history on file.      Home Medications    Prior to Admission medications   Medication Sig Start Date End Date Taking? Authorizing Provider  aspirin EC 81 MG tablet Take 81 mg by mouth daily.   Yes [provider]  Cholecalciferol 125 MCG (5000 UT) TABS Take 5,000 Units by mouth daily.   Yes [provider]  ciprofloxacin-dexamethasone (CIPRODEX) OTIC suspension Place 4 drops into the right ear 2 (two) times daily for 7 days. 07/21/20 07/28/20 Yes Laurene Footman B, PA-C  feeding supplement, ENSURE ENLIVE, (ENSURE ENLIVE) LIQD Take 237 mLs by mouth 3 (three) times daily between meals. 11/06/19  Yes Amin, Ankit Chirag, MD  insulin aspart (NOVOLOG) 100 UNIT/ML injection Inject 0-6 Units into the skin 3 (three) times daily with meals. 11/06/19  Yes Amin, Ankit Chirag, MD  insulin detemir (LEVEMIR) 100 UNIT/ML injection Inject 0.1 mLs (10 Units total) into the skin 2 (two) times daily. 11/06/19  Yes Damita Lack, MD  levETIRAcetam (KEPPRA) 750 MG tablet TAKE 2 TABLETS (1,500 MG TOTAL) BY MOUTH 2 (TWO) TIMES DAILY. 06/01/20  Yes Cameron Sprang, MD  levonorgestrel [facility]) 20 MCG/24HR IUD 1 each by Intrauterine route once.    Yes [provider]  levothyroxine (SYNTHROID) 150 MCG tablet Take 150 mcg by mouth every morning. 06/02/19  Yes [provider]  liothyronine (CYTOMEL) 5 MCG tablet Take 5 mcg by mouth  daily. Take in addition to levothyroxine.   Yes [provider]  lisinopril (PRINIVIL,ZESTRIL) 20 MG tablet Take 20 mg by mouth daily.   Yes [provider]  meloxicam (MOBIC) 15 MG tablet Take 1 tablet (15 mg total) by mouth daily. 07/08/20 08/07/20 Yes Laurene Footman B, PA-C  metoprolol tartrate (LOPRESSOR) 50 MG tablet Take 1 tablet (50 mg total) by mouth 2 (two) times daily. 11/09/19  Yes Amin, Jeanella Flattery, MD  Multiple Vitamin (MULTIVITAMIN WITH MINERALS) TABS tablet Take 1 tablet by mouth daily. 11/06/19  Yes Amin, Ankit Chirag, MD  potassium chloride SA (KLOR-CON) 20 MEQ tablet Take 1 tablet (20 mEq total) by mouth daily. Patient taking differently: Take 20 mEq by mouth 2 (two) times daily. 10/08/19  Yes Earleen Newport, MD  rosuvastatin (CRESTOR) 20 MG tablet Take 20 mg by mouth at bedtime.  08/04/19  Yes [provider]  tolterodine (DETROL LA) 4 MG 24 hr capsule Take 1 capsule (4 mg total) by mouth daily. 10/08/19 10/07/20 Yes Earleen Newport, MD  pravastatin (PRAVACHOL) 40 MG tablet Take 40 mg by mouth every evening.   08/16/19  [provider]    Family History Family History  Problem Relation Age of Onset  . Heart disease Father   . Breast cancer Neg Hx     Social History Social History   Tobacco Use  . Smoking status: Never Smoker  . Smokeless tobacco: Never Used  Vaping Use  . Vaping Use: Never used  Substance Use Topics  . Alcohol use: No  . Drug use: No     Allergies   Latex   Review of Systems Review of Systems  Constitutional: Negative for chills, diaphoresis, fatigue and fever.  HENT: Positive for ear pain. Negative for congestion, ear discharge, hearing loss, rhinorrhea, sinus pain and sore throat.   Respiratory: Negative for cough and shortness of breath.   Gastrointestinal: Negative for nausea and vomiting.  Musculoskeletal: Negative for myalgias.  Skin: Negative for rash.  Neurological: Negative for dizziness,  weakness and headaches.     Physical Exam Triage Vital Signs ED Triage Vitals  Enc Vitals Group     BP 07/21/20 1837 135/84     Pulse Rate 07/21/20 1837 99     Resp 07/21/20 1837 18     Temp 07/21/20 1837 98.3 F (36.8 C)     Temp Source 07/21/20 1837 Oral     SpO2 07/21/20 1837 100 %     Weight 07/21/20 1835 115 lb 15.4 oz (52.6 kg)     Height 07/21/20 1835 5\' 1"  (1.549 m)     Head Circumference --      Peak Flow --      Pain Score 07/21/20 1835 8     Pain Loc --      Pain Edu? --      Excl. in [place]? --    No data found.  Updated Vital Signs BP 135/84 (BP Location: Right Arm)   Pulse 99   Temp 98.3 F (36.8 C) (Oral)  Resp 18   Ht 5\' 1"  (1.549 m)   Wt 115 lb 15.4 oz (52.6 kg)   SpO2 100%   BMI 21.91 kg/m       Physical Exam Vitals and nursing note reviewed.  Constitutional:      General: She is not in acute distress.    Appearance: Normal appearance. She is not ill-appearing or toxic-appearing.  HENT:     Head: Normocephalic and atraumatic.     Right Ear: Hearing and external ear normal. There is impacted cerumen.     Left Ear: Tympanic membrane, ear canal and external ear normal.     Nose: Nose normal.     Mouth/Throat:     Mouth: Mucous membranes are moist.     Pharynx: Oropharynx is clear.  Eyes:     General: No scleral icterus.       Right eye: No discharge.        Left eye: No discharge.     Conjunctiva/sclera: Conjunctivae normal.  Cardiovascular:     Rate and Rhythm: Normal rate and regular rhythm.     Heart sounds: Normal heart sounds.  Pulmonary:     Effort: Pulmonary effort is normal. No respiratory distress.     Breath sounds: Normal breath sounds.  Musculoskeletal:     Cervical back: Neck supple.  Skin:    General: Skin is dry.  Neurological:     General: No focal deficit present.     Mental Status: She is alert. Mental status is at baseline.     Motor: No weakness.     Gait: Gait normal.  Psychiatric:        Mood and Affect: Mood  normal.        Behavior: Behavior normal.        Thought Content: Thought content normal.      UC Treatments / Results  Labs (all labs ordered are listed, but only abnormal results are displayed) Labs Reviewed - No data to display  EKG   Radiology No results found.  Procedures Procedures (including critical care time)  Medications Ordered in UC Medications - No data to display  Initial Impression / Assessment and Plan / UC Course  I have reviewed the triage vital signs and the nursing notes.  Pertinent labs & imaging results that were available during my care of the patient were reviewed by me and considered in my medical decision making (see chart for details).   60 y/o female presenting for right ear pain for a week.  Exam reveals cerumen impaction.  Otic lavage performed and wax cleaned out.  Patient stated she did not have much improvement in the discomfort.  She does have some erythema of the EAC which could represent some inflammation.  Since her ear was thoroughly flushed and the EAC is erythematous and she does have tenderness of the pinna, treating with Ciprodex for possible otitis externa.  Advised supportive care with increasing rest and fluids and warm pulses as well as Tylenol/Motrin for pain relief.  Advised her to follow-up she develops a fever, worsening ear pain or if her ear pain is not better after a week.  Advised she can follow with PCP, ENT or return to our clinic.  Final Clinical Impressions(s) / UC Diagnoses   Final diagnoses:  Otalgia of right ear  Impacted cerumen of right ear     Discharge Instructions     -Earwax cleaned out -There is some redness of your ear canal which could represent  infection. I have sent in an antibiotic ear drop -Use warm compresses, Motrin and Tylenol for pain relief -Follow up if not improving after the medication or for any new/worsening symptoms.    ED Prescriptions    Medication Sig Dispense Auth. Provider    ciprofloxacin-dexamethasone (CIPRODEX) OTIC suspension Place 4 drops into the right ear 2 (two) times daily for 7 days. 7.5 mL Danton Clap, PA-C     PDMP not reviewed this encounter.   Danton Clap, PA-C 07/21/20 1948

## 2020-07-24 ENCOUNTER — Ambulatory Visit
Admission: EM | Admit: 2020-07-24 | Discharge: 2020-07-24 | Disposition: A | Discharge status: Home / Self Care | Payer: Medicaid Other | Attending: Family Medicine | Admitting: Family Medicine

## 2020-07-24 ENCOUNTER — Other Ambulatory Visit: Payer: Self-pay

## 2020-07-24 DIAGNOSIS — H60501 Unspecified acute noninfective otitis externa, right ear: Secondary | ICD-10-CM | POA: Diagnosis not present

## 2020-07-24 MED ORDER — KETOROLAC TROMETHAMINE 10 MG PO TABS: 10.0000 mg | ORAL_TABLET | Freq: Four times a day (QID) | ORAL | 0 refills | Status: DC | PRN

## 2020-07-24 MED ORDER — CIPROFLOXACIN HCL 500 MG PO TABS
500.0000 mg | ORAL_TABLET | Freq: Two times a day (BID) | ORAL | 0 refills | Status: DC
Start: 2020-07-24 — End: 2021-11-21

## 2020-07-24 NOTE — ED Provider Notes (Signed)
[facility]    CSN: [id number] Arrival date & time: 07/24/20  1159      History   Chief Complaint Chief Complaint  Patient presents with  . Otalgia   HPI  60 year old female presents with right ear pain.  Patient was recently seen here on Wednesday.  Found to have a cerumen impaction.  This was irrigated and cerumen was removed without difficulty.  There was erythema of the canal and she was placed on Ciprodex.  Patient states that she continues to have severe right ear pain.  She reports compliance with Ciprodex but states that this has not improved.  She states that her pain is 10/10 in severity.  It is not responding to over-the-counter analgesics.  No current discharge.  No other complaints.   Past Medical History:  Diagnosis Date  . Diabetes mellitus without complication ([place])   . Hyperlipidemia   . Hypothyroidism   . Thyroid disease     Patient Active Problem List   Diagnosis Date Noted  . Goals of care, counseling/discussion   . Palliative care by specialist   . Protein-calorie malnutrition, severe 11/04/2019  . Seizure ([place]) 10/19/2019  . Hypoglycemia 10/19/2019  . Hypokalemia 10/19/2019  . Back pain 06/14/2017  . Fibroids 06/14/2017  . 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 Hypertension associated with diabetes ([place]) 06/14/2017  . Hypothyroid 06/14/2017  . Menometrorrhagia 06/14/2017  . Ovarian failure 09/22/2016  . Colon polyps 10/29/2014  . Left foot pain 04/15/2014  . Abnormal uterine bleeding 02/24/2014  . Cervical polyp 10/16/2013  . High risk medication use 10/16/2013  . Hyperlipidemia 10/16/2013  . Increased frequency of urination 10/16/2013  . Type 1 diabetes mellitus with other specified complication ([place]) 04/23/8249  . Diabetic keto-acidosis ([place]) 01/01/2013  . Graves disease 01/01/2013  . Hyperglycemia 01/01/2013  . Urine ketones 01/01/2013  . Annual physical exam 08/15/2012  . Pigmented skin lesions 08/15/2012    Past Surgical  History:  Procedure Laterality Date  . BIOPSY  11/02/2019   Procedure: BIOPSY;  Surgeon: [name], MD;  Location: [facility] ENDOSCOPY;  Service: Gastroenterology;;  . COLONOSCOPY WITH PROPOFOL N/A 11/02/2019   Procedure: COLONOSCOPY WITH PROPOFOL;  Surgeon: [name], MD;  Location: [facility];  Service: Gastroenterology;  Laterality: N/A;  . INCISION AND DRAINAGE ABSCESS Right 06/15/2017   Procedure: INCISION AND DRAINAGE ABSCESS;  Surgeon: Clayburn Pert, MD;  Location: [facility] ORS;  Service: General;  Laterality: Right;  . POLYPECTOMY  11/02/2019   Procedure: POLYPECTOMY;  Surgeon: [name], MD;  Location: [facility] ENDOSCOPY;  Service: Gastroenterology;;    OB History   No obstetric history on file.      Home Medications    Prior to Admission medications   Medication Sig Start Date End Date Taking? Authorizing Provider  ciprofloxacin (CIPRO) 500 MG tablet Take 1 tablet (500 mg total) by mouth 2 (two) times daily. 07/24/20  Yes Brix Brearley G, DO  ketorolac (TORADOL) 10 MG tablet Take 1 tablet (10 mg total) by mouth every 6 (six) hours as needed for moderate pain or severe pain. 07/24/20  Yes Coral Spikes, DO  aspirin EC 81 MG tablet Take 81 mg by mouth daily.    [provider]  Cholecalciferol 125 MCG (5000 UT) TABS Take 5,000 Units by mouth daily.    [provider]  ciprofloxacin-dexamethasone (CIPRODEX) OTIC suspension Place 4 drops into the right ear 2 (two) times daily for 7 days. 07/21/20 07/28/20  Laurene Footman B, PA-C  feeding supplement, ENSURE  ENLIVE, (ENSURE ENLIVE) LIQD Take 237 mLs by mouth 3 (three) times daily between meals. 11/06/19   Amin, Jeanella Flattery, MD  insulin aspart (NOVOLOG) 100 UNIT/ML injection Inject 0-6 Units into the skin 3 (three) times daily with meals. 11/06/19   Amin, Ankit Chirag, MD  insulin detemir (LEVEMIR) 100 UNIT/ML injection Inject 0.1 mLs (10 Units total) into the skin 2 (two) times daily. 11/06/19   Amin, Ankit Chirag, MD  levETIRAcetam  (KEPPRA) 750 MG tablet TAKE 2 TABLETS (1,500 MG TOTAL) BY MOUTH 2 (TWO) TIMES DAILY. 06/01/20   Cameron Sprang, MD  levonorgestrel (MIRENA) 20 MCG/24HR IUD 1 each by Intrauterine route once.     [provider]  levothyroxine (SYNTHROID) 150 MCG tablet Take 150 mcg by mouth every morning. 06/02/19   [provider]  liothyronine (CYTOMEL) 5 MCG tablet Take 5 mcg by mouth daily. Take in addition to levothyroxine.    [provider]  lisinopril (PRINIVIL,ZESTRIL) 20 MG tablet Take 20 mg by mouth daily.    [provider]  meloxicam (MOBIC) 15 MG tablet Take 1 tablet (15 mg total) by mouth daily. 07/08/20 08/07/20  Laurene Footman B, PA-C  metoprolol tartrate (LOPRESSOR) 50 MG tablet Take 1 tablet (50 mg total) by mouth 2 (two) times daily. 11/09/19   Amin, Jeanella Flattery, MD  Multiple Vitamin (MULTIVITAMIN WITH MINERALS) TABS tablet Take 1 tablet by mouth daily. 11/06/19   Amin, Jeanella Flattery, MD  potassium chloride SA (KLOR-CON) 20 MEQ tablet Take 1 tablet (20 mEq total) by mouth daily. Patient taking differently: Take 20 mEq by mouth 2 (two) times daily. 10/08/19   Earleen Newport, MD  rosuvastatin (CRESTOR) 20 MG tablet Take 20 mg by mouth at bedtime.  08/04/19   [provider]  tolterodine (DETROL LA) 4 MG 24 hr capsule Take 1 capsule (4 mg total) by mouth daily. 10/08/19 10/07/20  Earleen Newport, MD  pravastatin (PRAVACHOL) 40 MG tablet Take 40 mg by mouth every evening.   08/16/19  [provider]    Family History Family History  Problem Relation Age of Onset  . Heart disease Father   . Breast cancer Neg Hx     Social History Social History   Tobacco Use  . Smoking status: Never Smoker  . Smokeless tobacco: Never Used  Vaping Use  . Vaping Use: Never used  Substance Use Topics  . Alcohol use: No  . Drug use: No     Allergies   Latex   Review of Systems Review of Systems  Constitutional: Negative.   HENT: Positive for ear pain.  Negative for ear discharge.    Physical Exam Triage Vital Signs ED Triage Vitals  Enc Vitals Group     BP 07/24/20 1210 (!) 151/77     Pulse Rate 07/24/20 1210 90     Resp 07/24/20 1210 19     Temp 07/24/20 1210 98 F (36.7 C)     Temp Source 07/24/20 1210 Oral     SpO2 07/24/20 1210 100 %     Weight 07/24/20 1208 115 lb 15.4 oz (52.6 kg)     Height 07/24/20 1208 5\' 1"  (1.549 m)     Head Circumference --      Peak Flow --      Pain Score 07/24/20 1208 10     Pain Loc --      Pain Edu? --      Excl. in [place]? --    Updated  Vital Signs BP (!) 151/77 (BP Location: Left Arm)   Pulse 90   Temp 98 F (36.7 C) (Oral)   Resp 19   Ht 5\' 1"  (1.549 m)   Wt 52.6 kg   SpO2 100%   BMI 21.91 kg/m   Visual Acuity Right Eye Distance:   Left Eye Distance:   Bilateral Distance:    Right Eye Near:   Left Eye Near:    Bilateral Near:     Physical Exam Vitals and nursing note reviewed.  Constitutional:      General: She is not in acute distress.    Appearance: Normal appearance. She is not ill-appearing.  HENT:     Head: Normocephalic and atraumatic.     Ears:     Comments: Right ear -pain with movement of the ear/pinna; patient experiencing severe pain with insertion of otoscope tip.  There is debris in the canal.  Slight erythema.  TM normal. Cardiovascular:     Rate and Rhythm: Normal rate and regular rhythm.     Heart sounds: No murmur heard.   Pulmonary:     Effort: Pulmonary effort is normal.     Breath sounds: Normal breath sounds. No wheezing, rhonchi or rales.  Neurological:     Mental Status: She is alert.  Psychiatric:        Mood and Affect: Mood normal.        Behavior: Behavior normal.    UC Treatments / Results  Labs (all labs ordered are listed, but only abnormal results are displayed) Labs Reviewed - No data to display  EKG   Radiology No results found.  Procedures Procedures (including critical care time)  Medications Ordered in  UC Medications - No data to display  Initial Impression / Assessment and Plan / UC Course  I have reviewed the triage vital signs and the nursing notes.  Pertinent labs & imaging results that were available during my care of the patient were reviewed by me and considered in my medical decision making (see chart for details).    60 year old female presents with otitis externa.  Has not responded to Ciprodex.  Placing on oral Cipro.  Toradol as needed for pain.  Final Clinical Impressions(s) / UC Diagnoses   Final diagnoses:  Acute otitis externa of right ear, unspecified type   Discharge Instructions   None    ED Prescriptions    Medication Sig Dispense Auth. Provider   ciprofloxacin (CIPRO) 500 MG tablet Take 1 tablet (500 mg total) by mouth 2 (two) times daily. 14 tablet Bairon Klemann G, DO   ketorolac (TORADOL) 10 MG tablet Take 1 tablet (10 mg total) by mouth every 6 (six) hours as needed for moderate pain or severe pain. 20 tablet Coral Spikes, DO     PDMP not reviewed this encounter.   Coral Spikes, [place] 07/24/20 1329

## 2020-07-24 NOTE — ED Triage Notes (Signed)
Pt with 2 weeks of right ear pain. Seen here Wednesday for same and was given ear drops but not helping

## 2020-09-16 ENCOUNTER — Ambulatory Visit
Admission: RE | Admit: 2020-09-16 | Discharge: 2020-09-16 | Disposition: A | Discharge status: Home / Self Care | Payer: Medicaid Other | Source: Ambulatory Visit | Attending: Internal Medicine | Admitting: Internal Medicine

## 2020-09-16 ENCOUNTER — Other Ambulatory Visit: Payer: Self-pay

## 2020-09-16 DIAGNOSIS — Z1231 Encounter for screening mammogram for malignant neoplasm of breast: Secondary | ICD-10-CM | POA: Diagnosis present

## 2020-09-16 IMAGING — MG MM DIGITAL SCREENING BILAT W/ TOMO AND CAD
6 of 10 series · 6 of 30 positions shown · non-contrast
Comparison: Previous exam(s).

CLINICAL DATA: Screening.

EXAM:
DIGITAL SCREENING BILATERAL MAMMOGRAM WITH TOMOSYNTHESIS AND CAD
TECHNIQUE: Bilateral screening digital craniocaudal and mediolateral oblique
mammograms were obtained. Bilateral screening digital breast
tomosynthesis was performed. The images were evaluated with
computer-aided detection.

[L CC synth-2D]
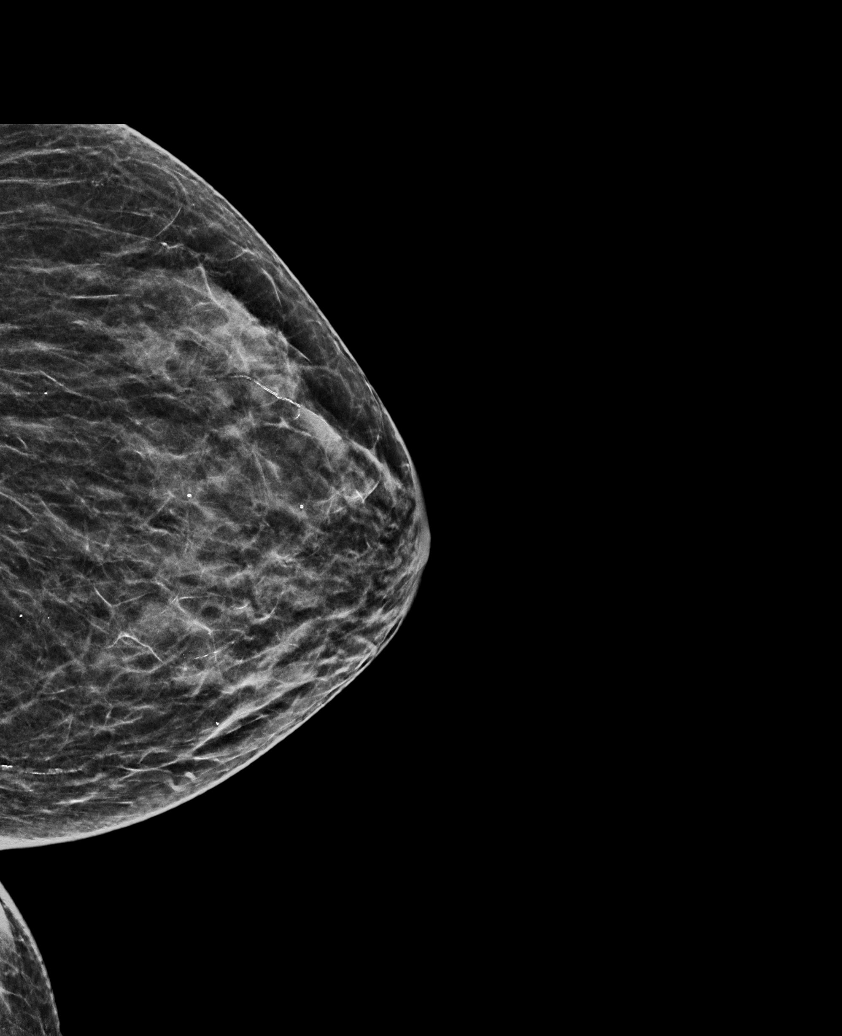

[R CC synth-2D]
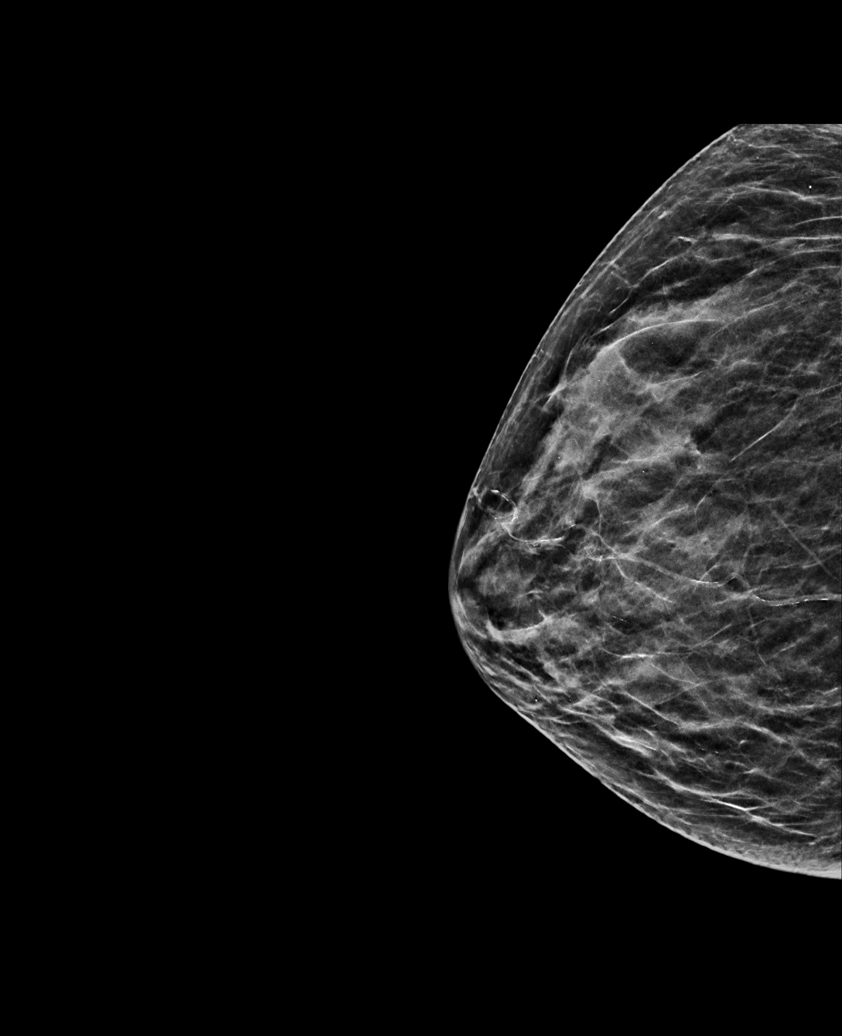

[L MLO synth-2D]
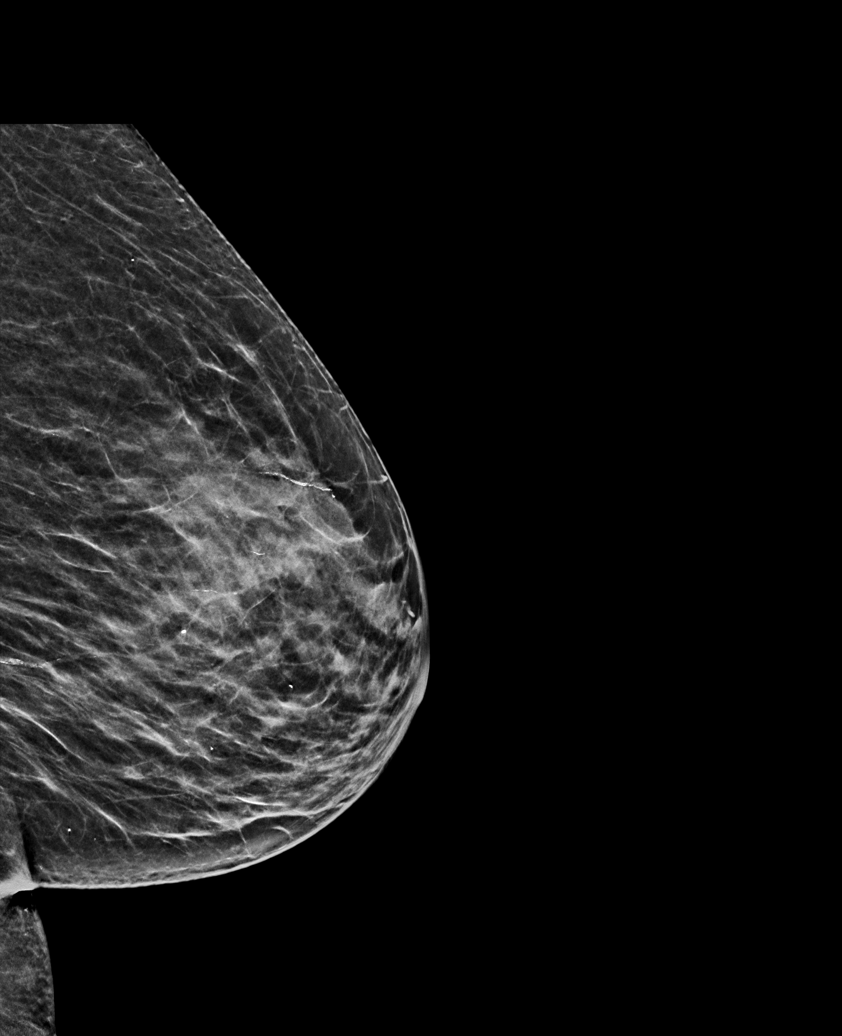

[R MLO synth-2D (1 of 2)]
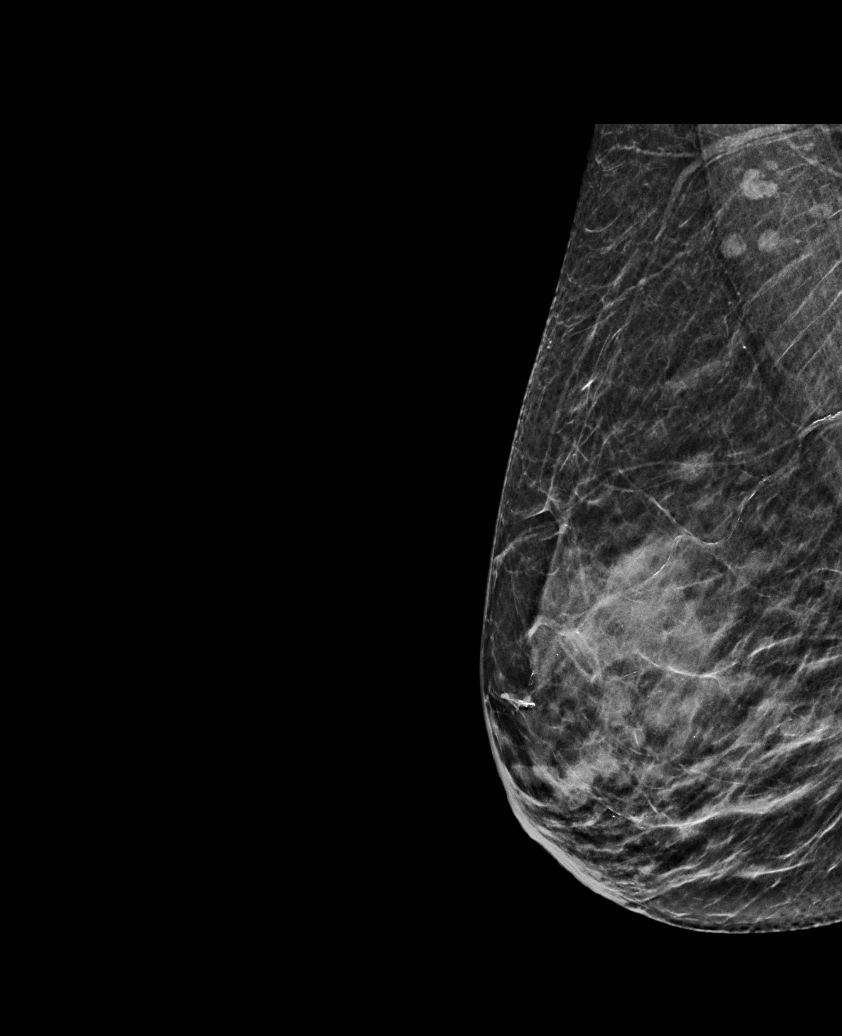

[R MLO synth-2D (2 of 2)]
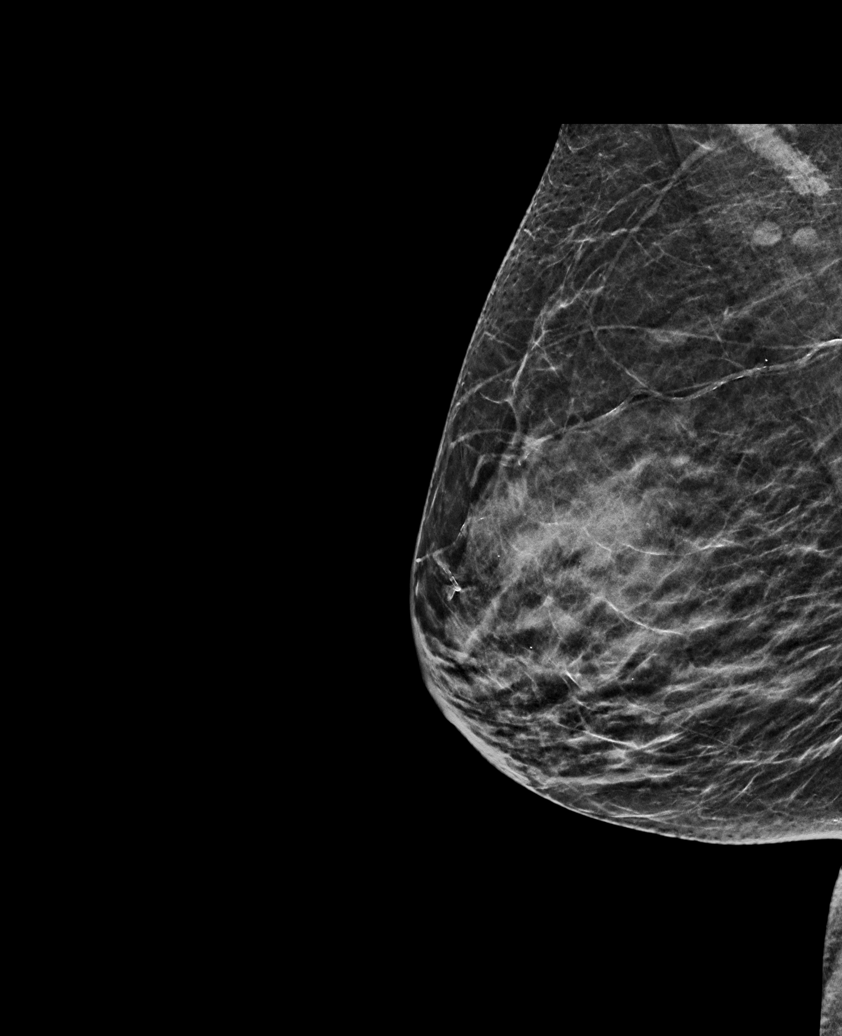

[L CC tomo · tomo slice 25/50.0]
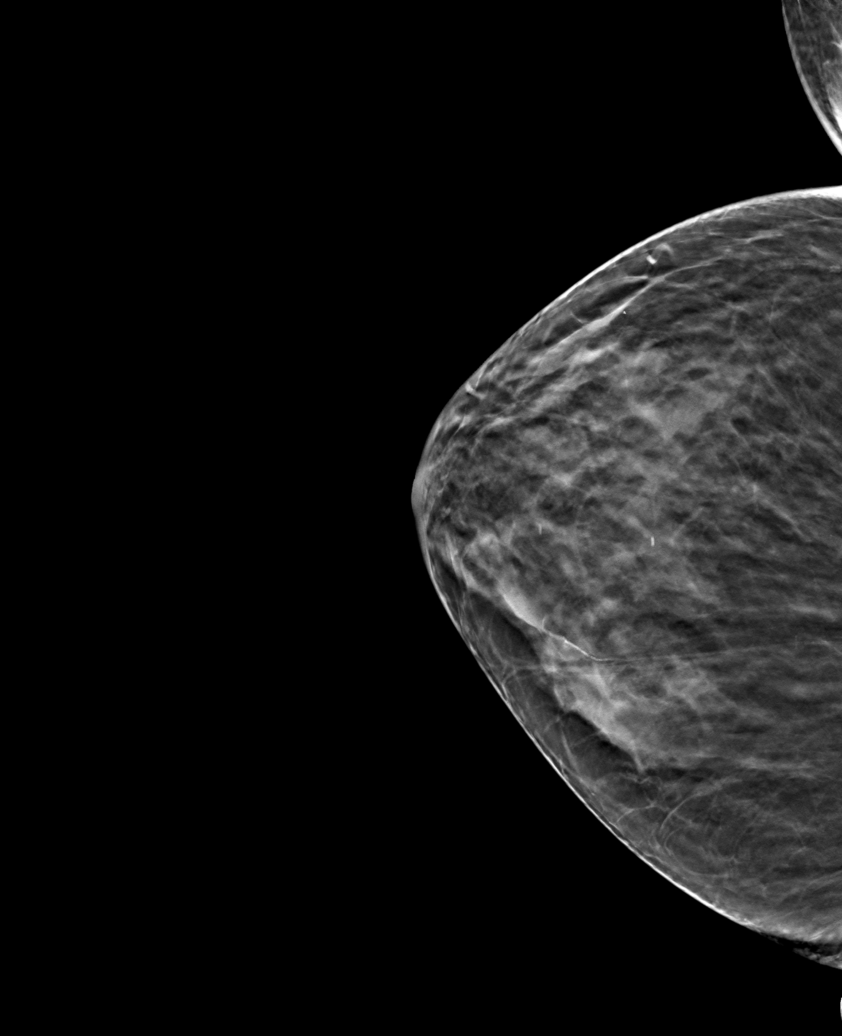

[6 of 30 positions shown; findings below may reference images not displayed]

ACR Breast Density Category c: The breast tissue is heterogeneously
dense, which may obscure small masses.
FINDINGS: In the left breast, a possible asymmetry warrants further
evaluation. In the right breast, no findings suspicious for
malignancy.
IMPRESSION: Further evaluation is suggested for possible asymmetry in the left
breast.

RECOMMENDATION:
Diagnostic mammogram and possibly ultrasound of the left breast.
(Code:[WN])

The patient will be contacted regarding the findings, and additional
imaging will be scheduled.

BI-RADS CATEGORY  0: Incomplete. Need additional imaging evaluation
and/or prior mammograms for comparison.

## 2020-09-20 ENCOUNTER — Other Ambulatory Visit: Payer: Self-pay | Admitting: Internal Medicine

## 2020-09-21 ENCOUNTER — Other Ambulatory Visit: Payer: Self-pay | Admitting: Internal Medicine

## 2020-09-21 DIAGNOSIS — R928 Other abnormal and inconclusive findings on diagnostic imaging of breast: Secondary | ICD-10-CM

## 2020-09-21 DIAGNOSIS — N6489 Other specified disorders of breast: Secondary | ICD-10-CM

## 2020-09-24 ENCOUNTER — Ambulatory Visit: Payer: Medicaid Other

## 2020-09-24 ENCOUNTER — Inpatient Hospital Stay: Admission: RE | Admit: 2020-09-24 | Payer: Medicaid Other | Source: Ambulatory Visit

## 2020-10-13 ENCOUNTER — Ambulatory Visit
Admission: RE | Admit: 2020-10-13 | Discharge: 2020-10-13 | Disposition: A | Discharge status: Home / Self Care | Payer: Medicaid Other | Source: Ambulatory Visit | Attending: Internal Medicine | Admitting: Internal Medicine

## 2020-10-13 ENCOUNTER — Other Ambulatory Visit: Payer: Self-pay

## 2020-10-13 DIAGNOSIS — R928 Other abnormal and inconclusive findings on diagnostic imaging of breast: Secondary | ICD-10-CM | POA: Insufficient documentation

## 2020-10-13 DIAGNOSIS — N6489 Other specified disorders of breast: Secondary | ICD-10-CM | POA: Insufficient documentation

## 2020-10-13 IMAGING — US US BREAST*L* LIMITED INC AXILLA
1 series · 3 of 3 positions shown · non-contrast
Comparison: Previous exam(s).

CLINICAL DATA: Callback for LEFT breast focal asymmetry

EXAM:
DIGITAL DIAGNOSTIC UNILATERAL LEFT MAMMOGRAM WITH TOMOSYNTHESIS AND
CAD; ULTRASOUND LEFT BREAST LIMITED
TECHNIQUE: Left digital diagnostic mammography and breast tomosynthesis was
performed. The images were evaluated with computer-aided detection.;
Targeted ultrasound examination of the left breast was performed

[Series 1: us breast*left* limited inc axilla · 0.07mm/px · 3 of 3 slices shown]
[im 1/3]
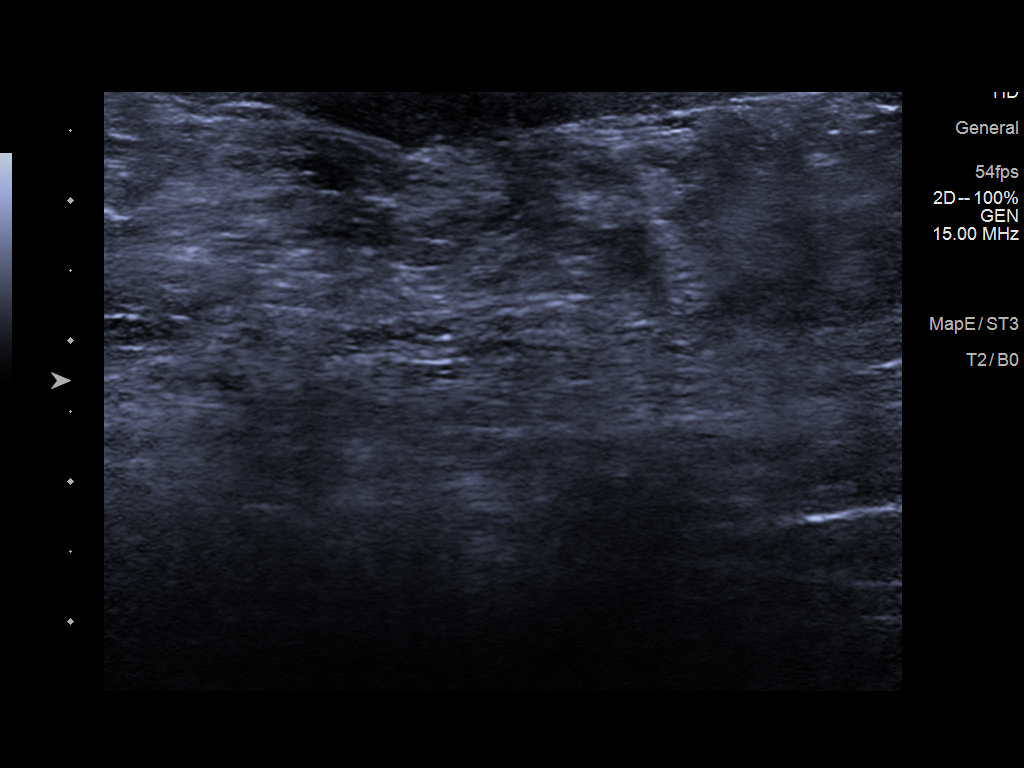
[im 2/3]
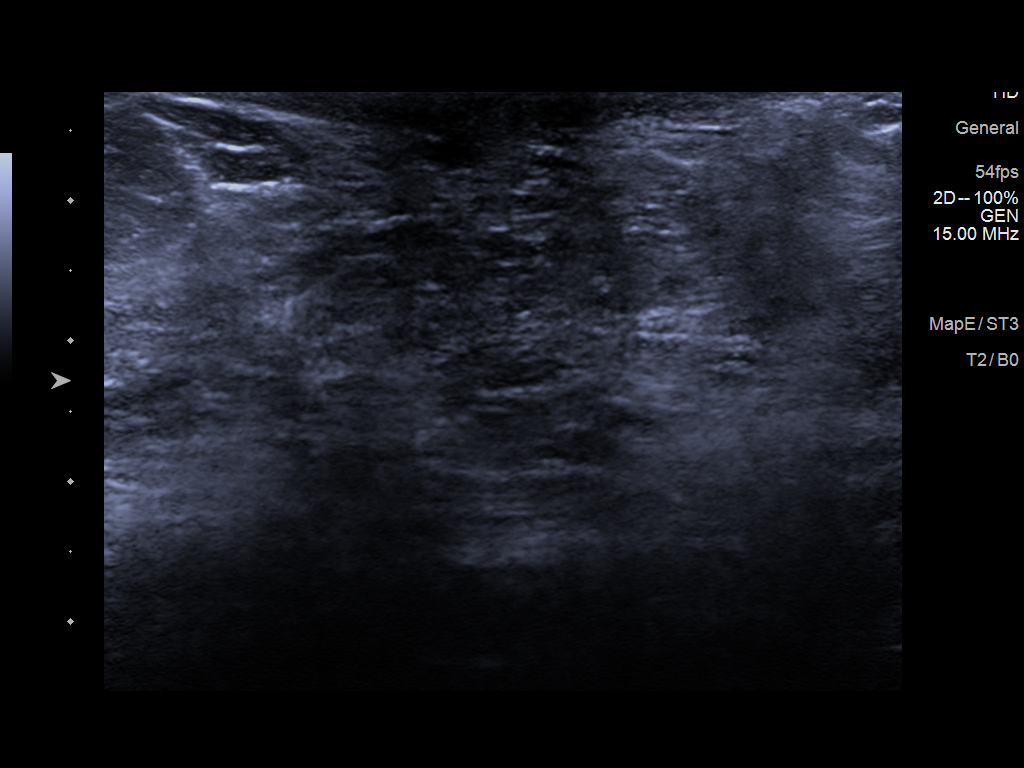
[im 3/3]
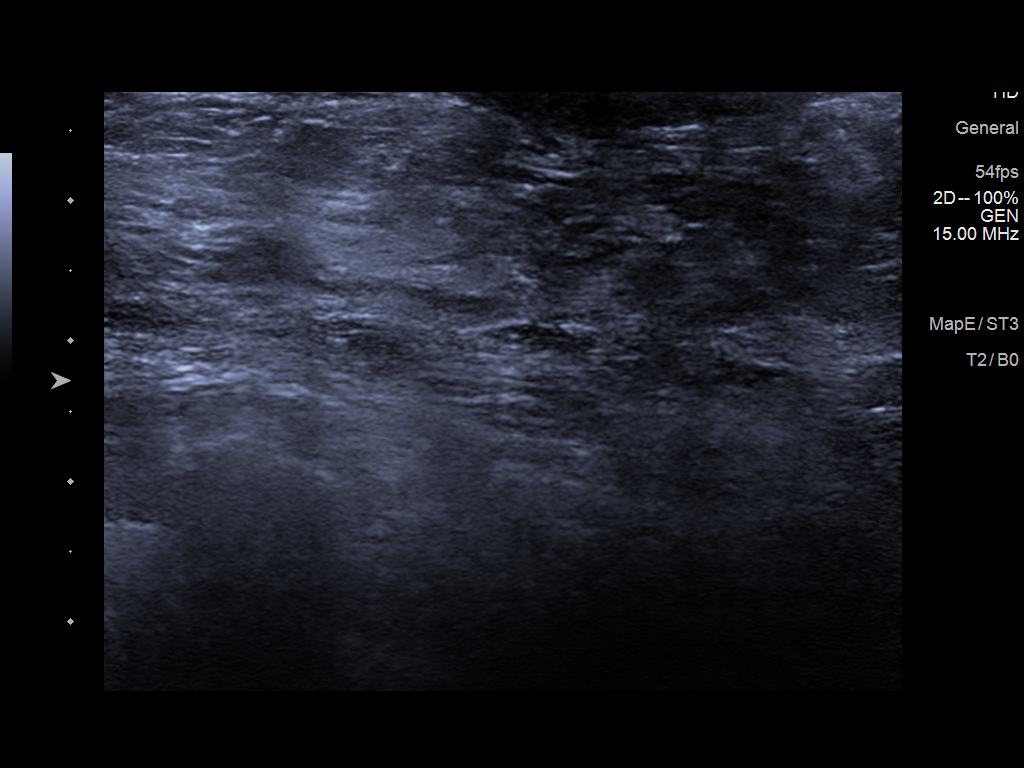

[3 of 3 positions shown; findings below may reference images not displayed]

ACR Breast Density Category c: The breast tissue is heterogeneously
dense, which may obscure small masses.
FINDINGS: The previously described finding does not persist with additional
views, consistent with superimposed fibroglandular tissue.
Fibroglandular tissue assumes a configuration stable in comparison
to mammograms. No suspicious mass, microcalcification, or other
finding is identified. Due to breast density at this location,
targeted ultrasound was performed.

On physical exam, no suspicious mass is appreciated.

Targeted ultrasound was performed the upper outer retroareolar
breast. No suspicious cystic or solid mass is seen. Dense
fibroglandular tissue is noted.
IMPRESSION: No mammographic or sonographic evidence of malignancy at the site of
screening mammographic concern.

RECOMMENDATION:
Screening mammogram in one year.(Code:[NX])

I have discussed the findings and recommendations with the patient.
If applicable, a reminder letter will be sent to the patient
regarding the next appointment.

BI-RADS CATEGORY  1: Negative.

## 2020-10-13 IMAGING — MG MM DIGITAL DIAGNOSTIC UNILAT*L* W/ TOMO W/ CAD
6 series · 6 of 18 positions shown · non-contrast
Comparison: Previous exam(s).

CLINICAL DATA: Callback for LEFT breast focal asymmetry

EXAM:
DIGITAL DIAGNOSTIC UNILATERAL LEFT MAMMOGRAM WITH TOMOSYNTHESIS AND
CAD; ULTRASOUND LEFT BREAST LIMITED
TECHNIQUE: Left digital diagnostic mammography and breast tomosynthesis was
performed. The images were evaluated with computer-aided detection.;
Targeted ultrasound examination of the left breast was performed

[L MLO synth-2D (1 of 2)]
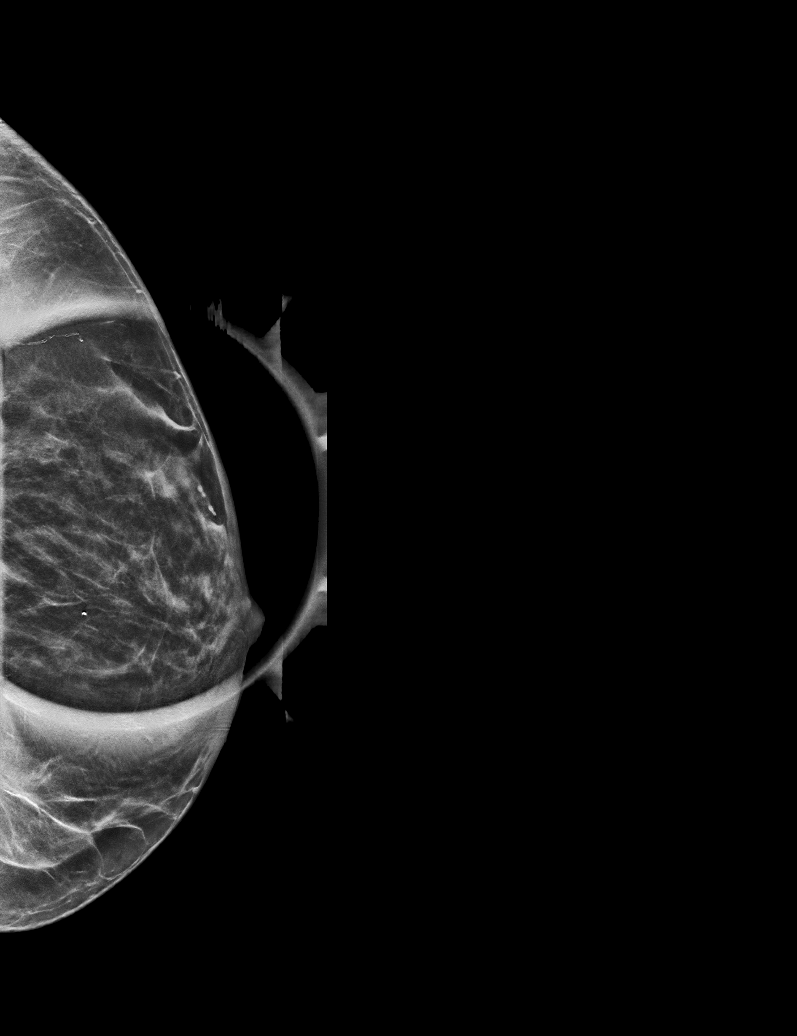

[L CC synth-2D]
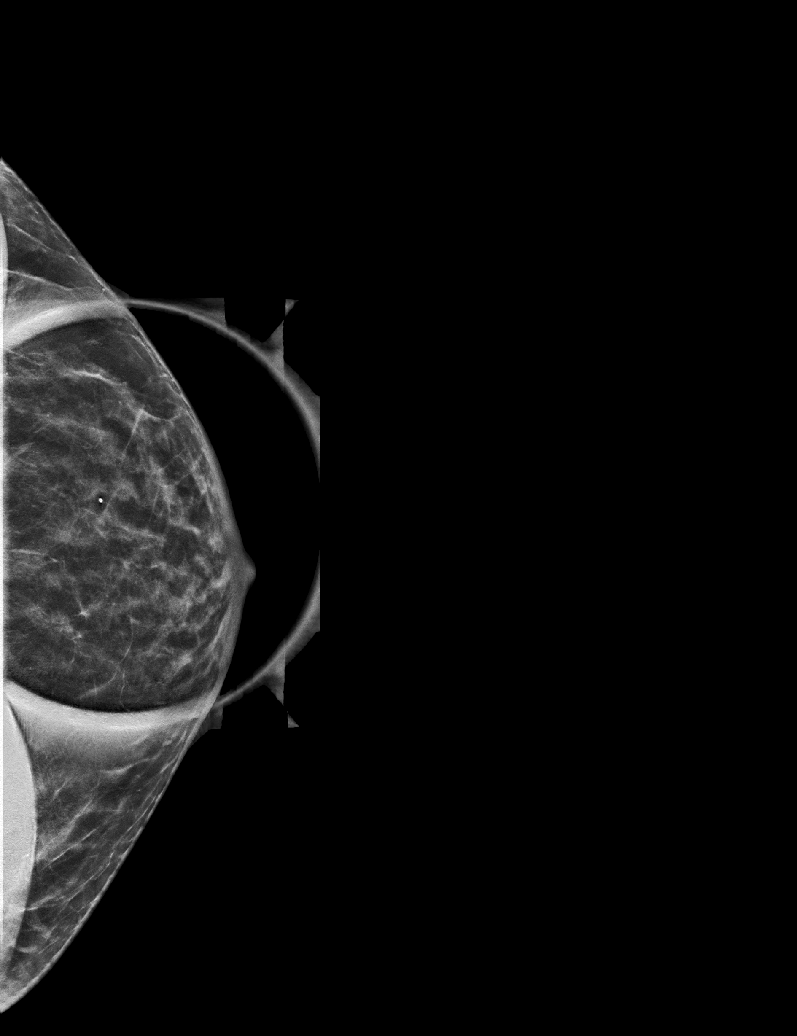

[L MLO synth-2D (2 of 2)]
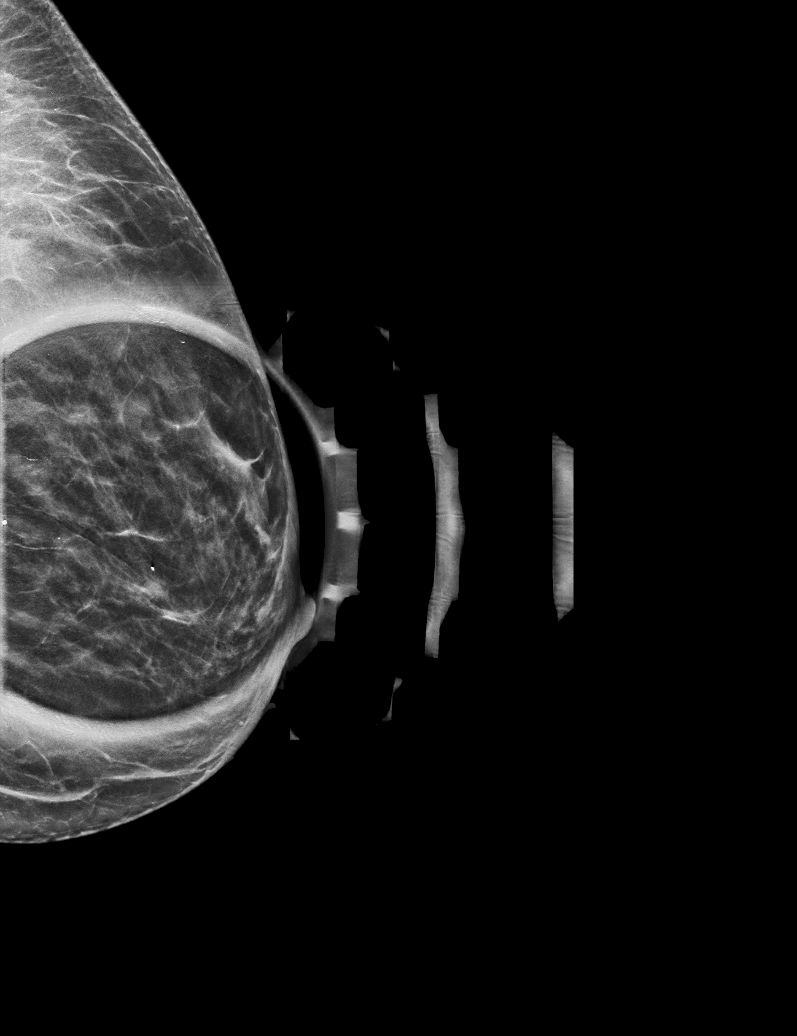

[L MLO tomo (1 of 2) · tomo slice 21/42.0]
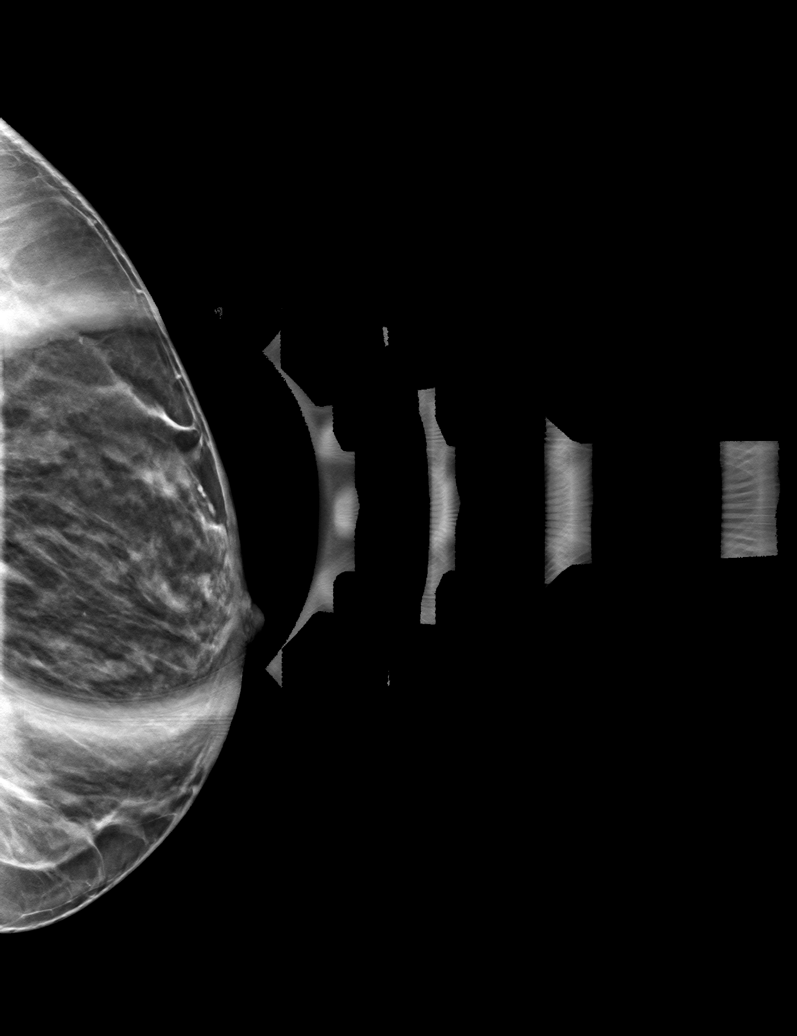

[L CC tomo · tomo slice 21/42.0]
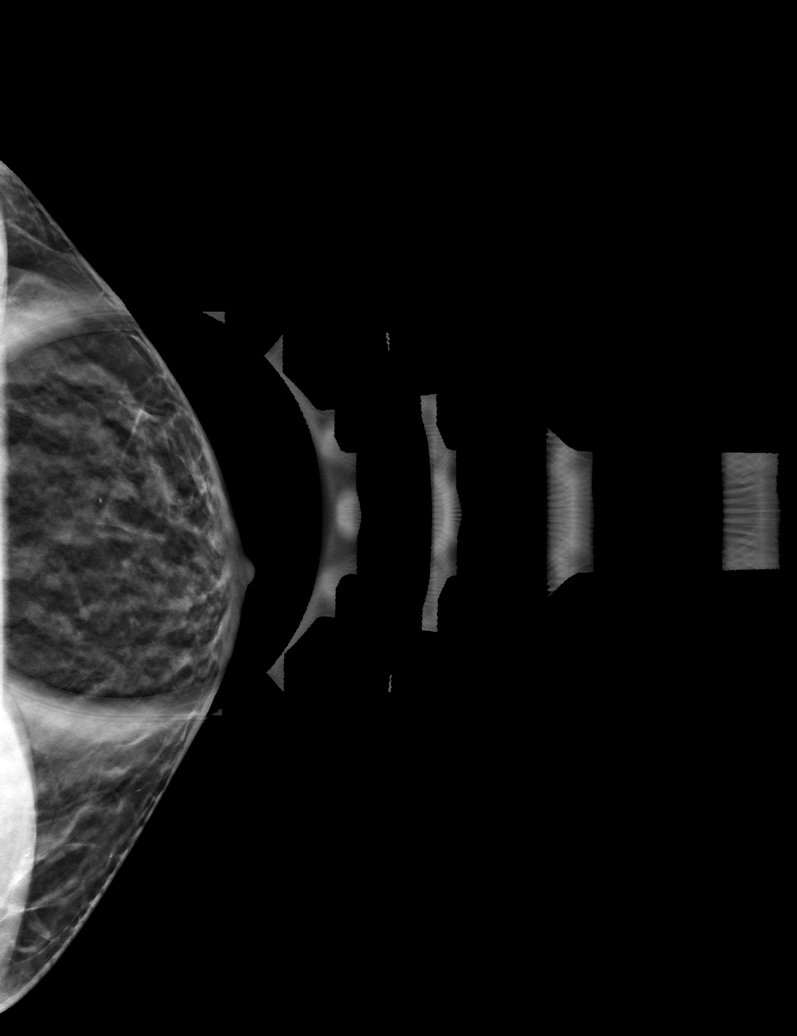

[L MLO tomo (2 of 2) · tomo slice 25/48.0]
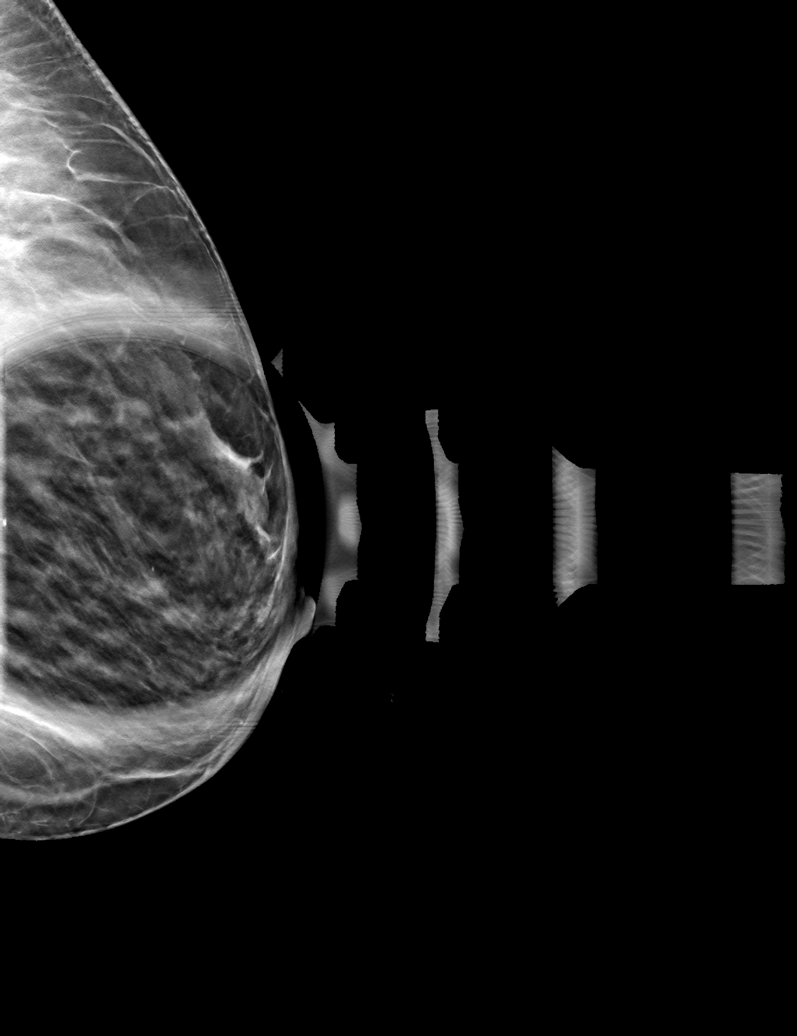

[6 of 18 positions shown; findings below may reference images not displayed]

ACR Breast Density Category c: The breast tissue is heterogeneously
dense, which may obscure small masses.
FINDINGS: The previously described finding does not persist with additional
views, consistent with superimposed fibroglandular tissue.
Fibroglandular tissue assumes a configuration stable in comparison
to mammograms. No suspicious mass, microcalcification, or other
finding is identified. Due to breast density at this location,
targeted ultrasound was performed.

On physical exam, no suspicious mass is appreciated.

Targeted ultrasound was performed the upper outer retroareolar
breast. No suspicious cystic or solid mass is seen. Dense
fibroglandular tissue is noted.
IMPRESSION: No mammographic or sonographic evidence of malignancy at the site of
screening mammographic concern.

RECOMMENDATION:
Screening mammogram in one year.(Code:[NX])

I have discussed the findings and recommendations with the patient.
If applicable, a reminder letter will be sent to the patient
regarding the next appointment.

BI-RADS CATEGORY  1: Negative.

## 2020-10-28 ENCOUNTER — Other Ambulatory Visit: Payer: Self-pay | Admitting: Internal Medicine

## 2020-10-28 DIAGNOSIS — R9389 Abnormal findings on diagnostic imaging of other specified body structures: Secondary | ICD-10-CM

## 2020-11-12 ENCOUNTER — Other Ambulatory Visit: Payer: Self-pay

## 2020-11-12 ENCOUNTER — Ambulatory Visit
Admission: RE | Admit: 2020-11-12 | Discharge: 2020-11-12 | Disposition: A | Discharge status: Home / Self Care | Payer: Medicaid Other | Source: Ambulatory Visit | Attending: Internal Medicine | Admitting: Internal Medicine

## 2020-11-12 DIAGNOSIS — R9389 Abnormal findings on diagnostic imaging of other specified body structures: Secondary | ICD-10-CM

## 2020-11-12 IMAGING — CT CT CHEST W/O CM
2 of 4 series · 15 of 36 positions shown, 18 images · non-contrast
Comparison: [DATE]

CLINICAL DATA: Follow-up abnormalities demonstrated by CT last
year.

EXAM:
CT CHEST WITHOUT CONTRAST
TECHNIQUE: Multidetector CT imaging of the chest was performed following the
standard protocol without IV contrast.

[Series 2: chest 2.00 · axial · 0.56mm/px · z∈[-1155,-913]mm · 12 of 145 slices shown, 15 images]
[im 12/145  mediastinal]
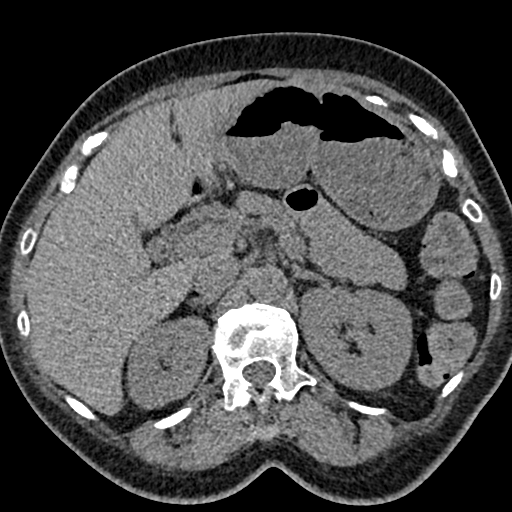
[im 12/145  lung]
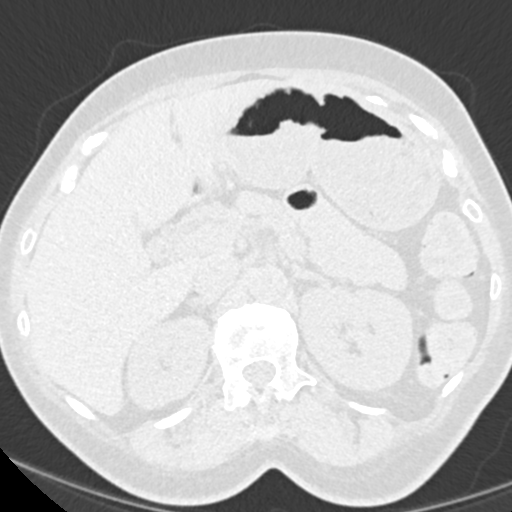
[im 23/145  lung]
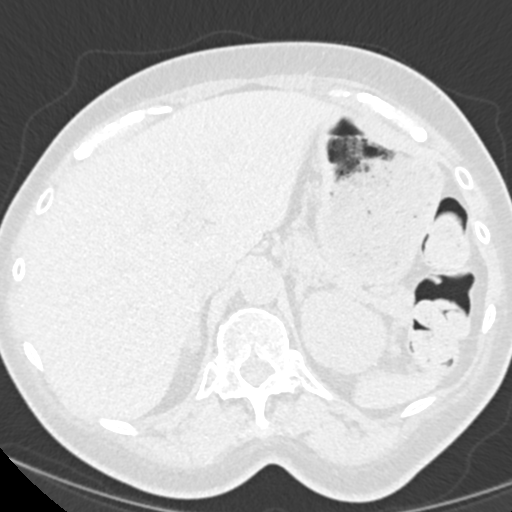
[im 34/145  lung]
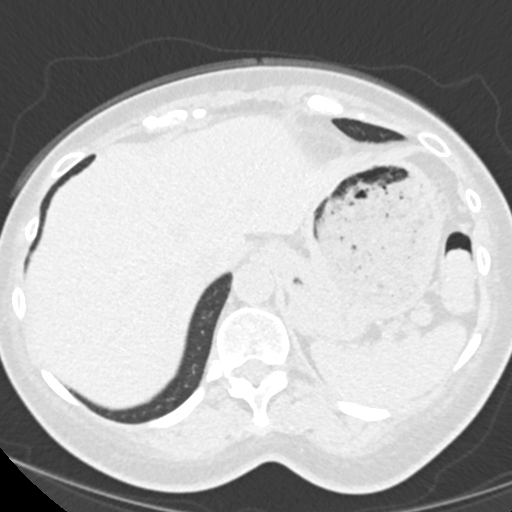
[im 45/145  lung]
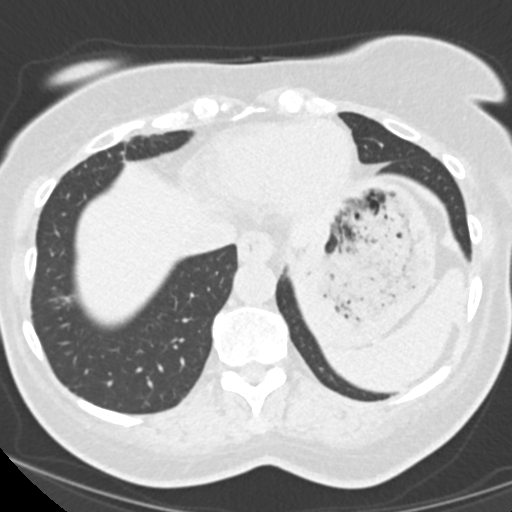
[im 56/145  mediastinal]
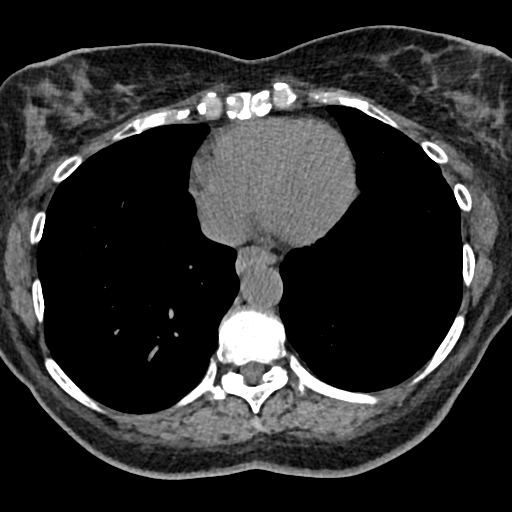
[im 56/145  lung]
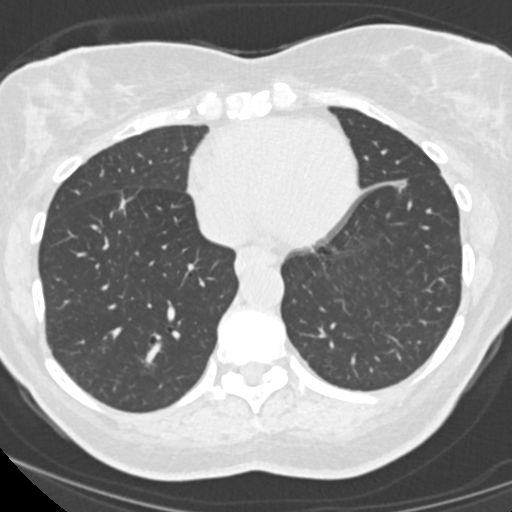
[im 67/145  lung]
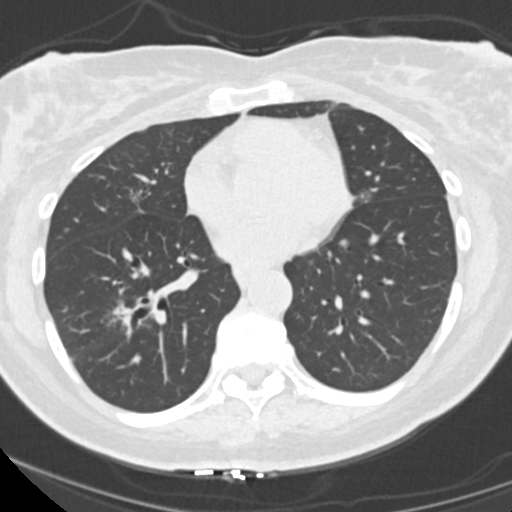
[im 78/145  lung]
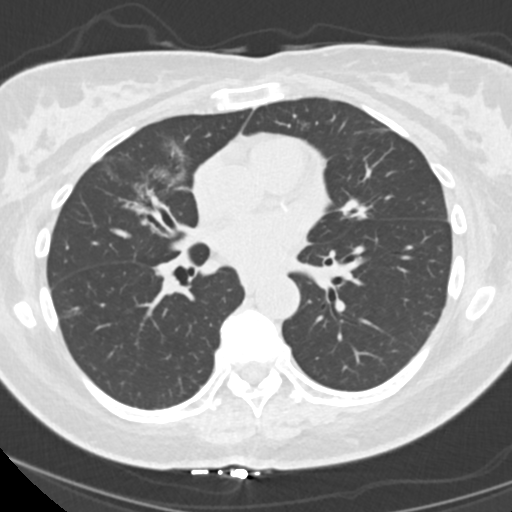
[im 89/145  lung]
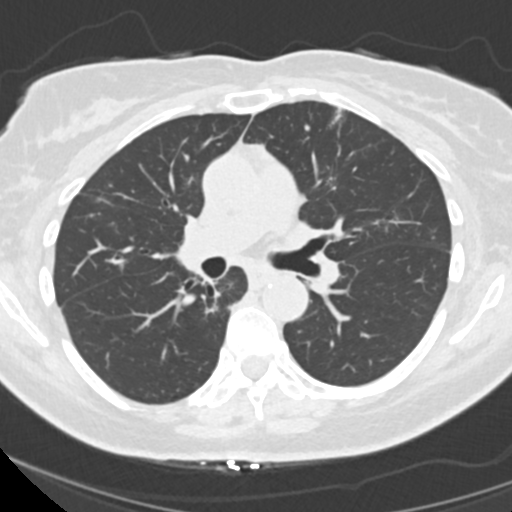
[im 100/145  mediastinal]
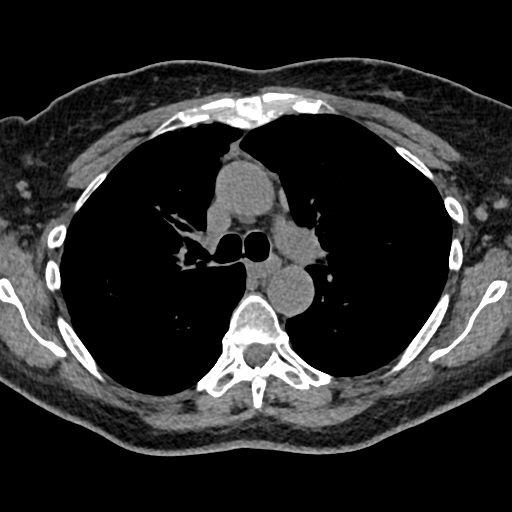
[im 100/145  lung]
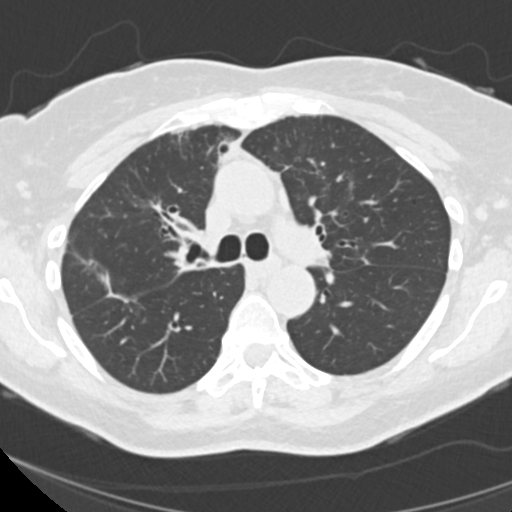
[im 111/145  lung]
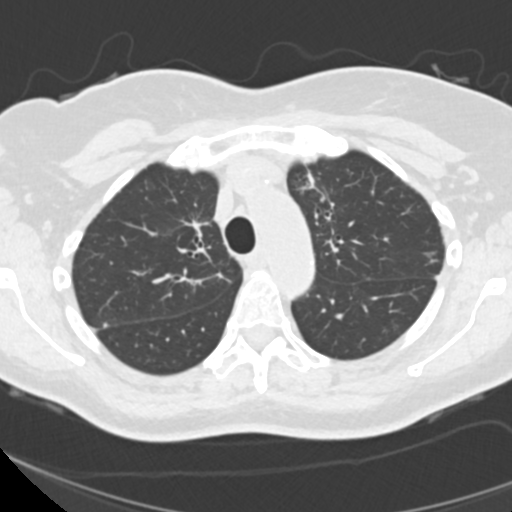
[im 122/145  lung]
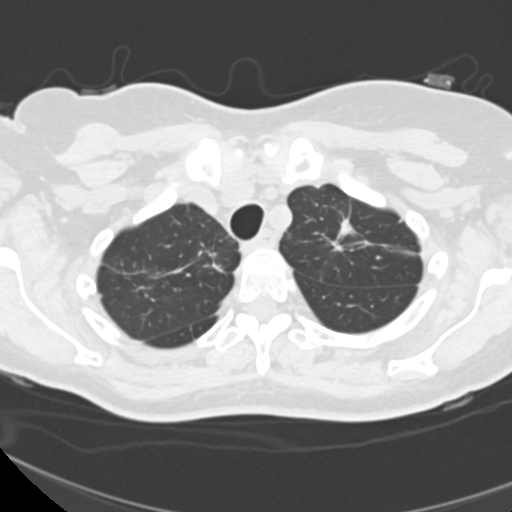
[im 133/145  lung]
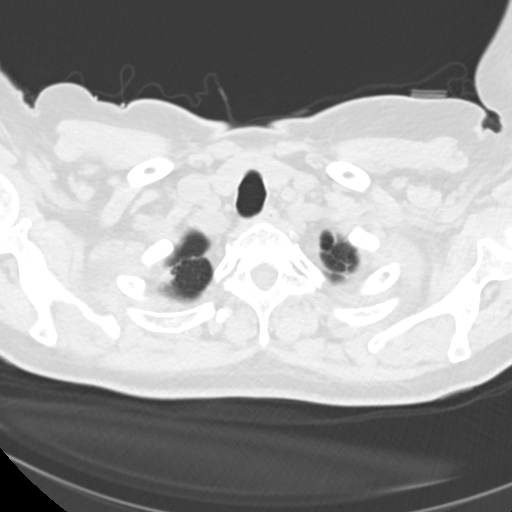

[Series 5: coronals chest 2.00 cor · coronal · 0.56mm/px · 3 of 122 slices shown]
[im 25/122  lung]
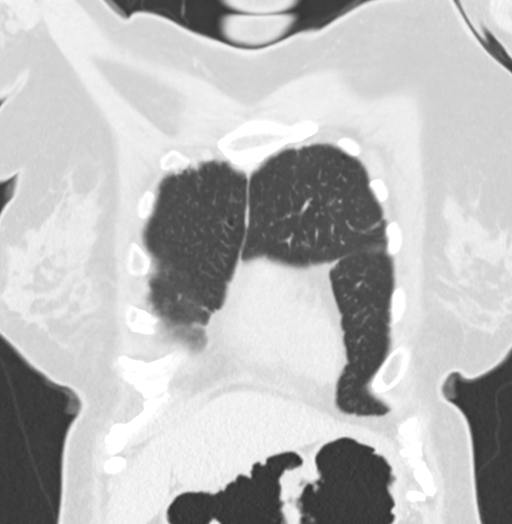
[im 49/122  lung]
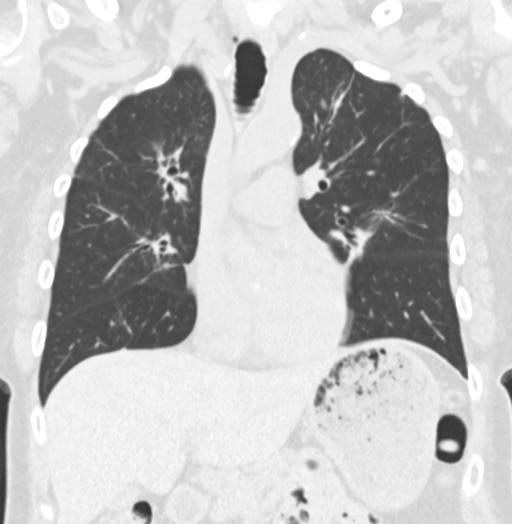
[im 73/122  lung]
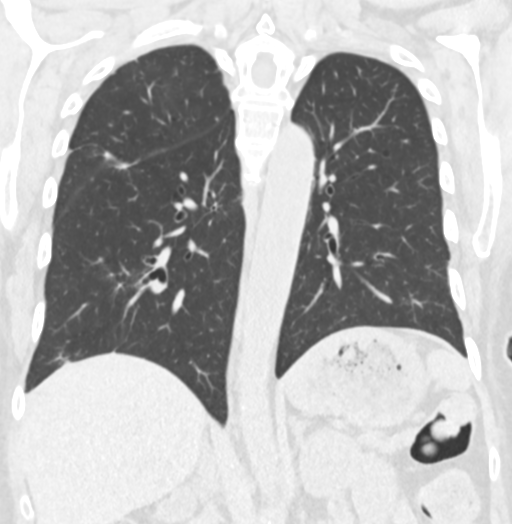

[15 of 36 positions shown; findings below may reference images not displayed]

FINDINGS: Cardiovascular: Heart size is normal. No pericardial effusion. Some
coronary artery calcification noted in the left system. Mild aortic
atherosclerotic calcification. Pulmonary arteries appear
unremarkable.

Mediastinum/Nodes: No mass or lymphadenopathy.

Lungs/Pleura: Multiple areas of pulmonary scarring scattered
throughout both lungs. Some emphysematous changes at the apices.
Findings are most consistent with postinflammatory scarring. None of
these have changed in a fashion to suggest neoplastic disease. Other
possibilities such as sarcoid could also be considered. No pleural
effusion.

Upper Abdomen: Mild fatty change of the liver.  No acute finding.

Musculoskeletal: Old minor superior endplate fracture at T1.
IMPRESSION: Numerous areas of scattered bilateral pulmonary scarring similar to
the study of last year, most consistent with postinflammatory
scarring. One could also consider sarcoid as an etiology, though
there is no visible adenopathy. There is no change to suggest
neoplastic disease.

Aortic Atherosclerosis ([38]-[38]). Coronary artery calcification
also noted.

## 2020-11-17 ENCOUNTER — Ambulatory Visit: Payer: Medicaid Other | Admitting: Neurology

## 2020-11-17 ENCOUNTER — Encounter: Payer: Self-pay | Admitting: Neurology

## 2020-11-17 ENCOUNTER — Other Ambulatory Visit: Payer: Self-pay

## 2020-11-17 VITALS — BP 125/77 | HR 98 | Ht 61.0 in | Wt 137.0 lb

## 2020-11-17 DIAGNOSIS — G3184 Mild cognitive impairment, so stated: Secondary | ICD-10-CM

## 2020-11-17 DIAGNOSIS — G40009 Localization-related (focal) (partial) idiopathic epilepsy and epileptic syndromes with seizures of localized onset, not intractable, without status epilepticus: Secondary | ICD-10-CM

## 2020-11-17 MED ORDER — LEVETIRACETAM 750 MG PO TABS: ORAL_TABLET | ORAL | 3 refills | Status: DC

## 2020-11-17 NOTE — Patient Instructions (Signed)
Good to see you. Continue Keppra 750mg : take 1 tablet in AM, 2 tablets in PM. Follow-up in 1 year, call for any changes   Seizure Precautions: 1. If medication has been prescribed for you to prevent seizures, take it exactly as directed.  Do not stop taking the medicine without talking to your doctor first, even if you have not had a seizure in a long time.   2. Avoid activities in which a seizure would cause danger to yourself or to others.  Don't operate dangerous machinery, swim alone, or climb in high or dangerous places, such as on ladders, roofs, or girders.  Do not drive unless your doctor says you may.  3. If you have any warning that you may have a seizure, lay down in a safe place where you can't hurt yourself.    4.  No driving for 6 months from last seizure, as per [facility].   Please refer to the following link on the [facility] website for more information: http://www.epilepsyfoundation.org/answerplace/Social/driving/drivingu.cfm   5.  Maintain good sleep hygiene. Avoid alcohol.  6.  Contact your doctor if you have any problems that may be related to the medicine you are taking.  7.  Call 911 and bring the patient back to the ED if:        A.  The seizure lasts longer than 5 minutes.       B.  The patient doesn't awaken shortly after the seizure  C.  The patient has new problems such as difficulty seeing, speaking or moving  D.  The patient was injured during the seizure  E.  The patient has a temperature over 102 F (39C)  F.  The patient vomited and now is having trouble breathing

## 2020-11-17 NOTE — Progress Notes (Signed)
NEUROLOGY FOLLOW UP OFFICE NOTE  Andrea [name] [id number] 1960/09/09  HISTORY OF PRESENT ILLNESS: I had the pleasure of seeing Andrea [name] in follow-up in the neurology clinic on 11/17/2020.  The patient was last seen 6 months ago. She is alone in the office today. In June 2021, she had episodes of confusion in the setting on hypoglycemia, then had witnessed focal seizures with left facial twitching. She was somnolent on arrival to the ER with MRI brain showing restricted diffusion in the right medial frontal cortex, right insula, right hippocampus. EEG initially showed generalized bifrontal periodic epileptiform discharges, then right frontocentral periodic discharges. Repeat MRI brain with and without contrast done 03/2020 was unremarkable with interval resolution of restricted diffusion on the right hemisphere. Her 1-hour EEG in 12/2019 was normal.   On her last visit, she reported daytime drowsiness with Levetiracetam, dose reduced to 750mg  in AM, 1500mg  in PM. She reports improvement in drowsiness and denies any side effects on current regimen. She denies any staring/unresponsive episodes, gaps in time, facial twitching, focal numbness/tingling/weakness, myoclonic jerks. No headaches, dizziness, vision changes, no falls. Sleep is good. Mood is okay. Memory is okay, she lives with her grandson who fixes her pillbox. She takes her medications by herself without issues. She has driven a little without getting lost. Sugar levels are sometimes high. She reports a left frozen shoulder.    History on Initial Assessment 01/09/2020: This is a pleasant 60 year old right-handed woman with a history of type I DM, hypothyroidism, presenting after hospital discharge for new onset seizures. 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 [name] reports that she started having episodes of confusion earlier in June, they were back and forth to the hospital, she was in the ER for  hypoglycemia of 24 on 6/19, non-verbal with generalized weakness. She was back in the ER on 6/20 again hypoglycemic (glucose 30) with left-sided weakness. Family had not witnessed any seizure activity, however EMS on scene noted 2 focal seizures, given IV Versed and dextrose. She was somnolent and not following commands on arrival. MRI brain showed restricted diffusion in the right medial frontal cortex, right insual, and right hippocampus, no abnormal enhancement. Her prolonged EEG showed generalized, maximal bifrontal periodic epileptiform discharges initially, then right frontocentral periodic discharges. She had multiple episodes of left facial twitching with generalized theta-alpha activity. Seizures improved with Levetiracetam and clobazam, she continued to have left sensory hemineglect, EEG continued to show LPDs on the right hemisphere. She then had recurrence of facial twitching and had a malignancy workup, CT abdomen/pelvis noted sigmoid colonic wall thickening which could represent focal colitis versus colonic neoplasm. She had LP which was overall unremarkable except for elevated glucose. Pending autoimmune and paraneoplastic Mayo panel, she was started empirically on Solumedrol for 5 days. Panel has come back negative. She was discharged home on Levetiracetam 1500mg  BID and Clobazam 5mg  BID. They deny any further seizures since hospital discharge. She missed 2 appointments scheduled and follows up today, they report running out of seizure medications 3 weeks ago with no seizure recurrence. Glucose levels have stabilized, she has not had significant hypoglycemia like before. She denies any facial twitching, no focal numbness/tingling/weakness. [name] has not witnessed any staring/unresponsive episodes. Her main concern today is right hip/thigh pain, which started before her June admission. [name] is asking for memory evaluation. She started having mild memory changes prior to June, however memory changes  have progressively worsened since then. She  gets confused with dates and repeats herself several times. She says "sometimes I forget." She stopped driving at the beginning of the year because "I was just tired." She denied getting lost driving. She takes her own medications in the morning, her grandson helps her for the evening medications because she may forget. She remembers to pay the rent but gets confused, so [name] helps. She does not cook. 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 Her daughter had seizures and passed away. She had a normal birth and early development.  There is no history of febrile convulsions, CNS infections such as meningitis/encephalitis, significant traumatic brain injury, neurosurgical procedures.  Diagnostic Data: MRI brain in June 2021 showed restricted diffusion in the right medial frontal cortex, right insula, right hippocampus.  Repeat MRI brain with and without contrast done 03/2020 was unremarkable with interval resolution of restricted diffusion on the right hemisphere.   EEG in June 2021 initially showed generalized bifrontal periodic epileptiform discharges, then right frontocentral periodic discharges. 1-hour EEG in 12/2019 was normal.   Neuropsychological testing in November 2021 noted cognitive impairment mainly with frontal-subcortical variety with additional visuospatial findings. There was some lateralization (greater right hemispheric problems). 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   PAST MEDICAL HISTORY: Past Medical History:  Diagnosis Date   Diabetes mellitus without complication ([place])    Hyperlipidemia    Hypothyroidism    Thyroid disease     MEDICATIONS: Current Outpatient Medications on File Prior to Visit  Medication Sig Dispense  Refill   aspirin EC 81 MG tablet Take 81 mg by mouth daily.     Cholecalciferol 125 MCG (5000 UT) TABS Take 5,000 Units by mouth daily.     ciprofloxacin (CIPRO) 500 MG tablet Take 1 tablet (500 mg total) by mouth 2 (two) times daily. 14 tablet 0   feeding supplement, ENSURE ENLIVE, (ENSURE ENLIVE) LIQD Take 237 mLs by mouth 3 (three) times daily between meals. 237 mL 12   insulin aspart (NOVOLOG) 100 UNIT/ML injection Inject 0-6 Units into the skin 3 (three) times daily with meals. 10 mL 11   insulin detemir (LEVEMIR) 100 UNIT/ML injection Inject 0.1 mLs (10 Units total) into the skin 2 (two) times daily. 10 mL 11   ketorolac (TORADOL) 10 MG tablet Take 1 tablet (10 mg total) by mouth every 6 (six) hours as needed for moderate pain or severe pain. 20 tablet 0   levETIRAcetam (KEPPRA) 750 MG tablet TAKE 2 TABLETS (1,500 MG TOTAL) BY MOUTH 2 (TWO) TIMES DAILY. 360 tablet 1   levonorgestrel (MIRENA) 20 MCG/24HR IUD 1 each by Intrauterine route once.      levothyroxine (SYNTHROID) 150 MCG tablet Take 150 mcg by mouth every morning.     liothyronine (CYTOMEL) 5 MCG tablet Take 5 mcg by mouth daily. Take in addition to levothyroxine.     lisinopril (PRINIVIL,ZESTRIL) 20 MG tablet Take 20 mg by mouth daily.     metoprolol tartrate (LOPRESSOR) 50 MG tablet Take 1 tablet (50 mg total) by mouth 2 (two) times daily.     Multiple Vitamin (MULTIVITAMIN WITH MINERALS) TABS tablet Take 1 tablet by mouth daily.     potassium chloride SA (KLOR-CON) 20 MEQ tablet Take 1 tablet (20 mEq total) by mouth daily. (Patient taking differently: Take 20 mEq by mouth  2 (two) times daily.) 30 tablet 0   rosuvastatin (CRESTOR) 20 MG tablet Take 20 mg by mouth at bedtime.      [DISCONTINUED] pravastatin (PRAVACHOL) 40 MG tablet Take 40 mg by mouth every evening.      No current facility-administered medications on file prior to visit.    ALLERGIES: Allergies  Allergen Reactions   Latex Hives    FAMILY HISTORY: Family  History  Problem Relation Age of Onset   Heart disease Father    Breast cancer Neg Hx     SOCIAL HISTORY: Social History   Socioeconomic History   Marital status: Single    Spouse name: Not on file   Number of children: Not on file   Years of education: Not on file   Highest education level: Not on file  Occupational History   Not on file  Tobacco Use   Smoking status: Never   Smokeless tobacco: Never  Vaping Use   Vaping Use: Never used  Substance and Sexual Activity   Alcohol use: No   Drug use: No   Sexual activity: Not on file  Other Topics Concern   Not on file  Social History Narrative   Right handed    Lives with family    Social Determinants of Health   Financial Resource Strain: Not on file  Food Insecurity: Not on file  Transportation Needs: Not on file  Physical Activity: Not on file  Stress: Not on file  Social Connections: Not on file  Intimate Partner Violence: Not on file     PHYSICAL EXAM: Vitals:   11/17/20 1431  BP: 125/77  Pulse: 98  SpO2: 96%   General: No acute distress Head:  Normocephalic/atraumatic Skin/Extremities: No rash, no edema Neurological Exam: alert and awake. No aphasia or dysarthria. Fund of knowledge is appropriate.  Attention and concentration are normal.   Cranial nerves: Pupils equal, round. Extraocular movements intact with no nystagmus. Visual fields full.  No facial asymmetry.  Motor: Bulk and tone normal, muscle strength 5/5 throughout with no pronator drift.   Finger to nose testing intact.  Gait narrow-based and steady, no ataxia   IMPRESSION: This is a pleasant 60 yo RH woman with a history of type I DM, hypothyroidism, new onset focal status epilepticus in the setting of hypoglycemia (BG 30 on arrival). EEG in June 2021 captured multiple episodes of left facial twitching with electrographic correlate. With clinical improvement, EEG continued to show lateralized periodic discharges over the right hemisphere,  maximal over the right medial frontocentral region. Her MRI brain on 6/21 showed restricted diffusion in the right medical frontal cortex, right insula, and right hippocampus. LP unremarkable, autoimmune/paraneoplastic panel negative. She was treated empirically with 5 days of IV Solumedrol. Repeat MRI brain in 03/2020 showed interval resolution of right hemisphere changes, EEG was normal. She has been seizure-free since June 2021 tolerating Levetiracetam 750mg  in AM, 1500mg  in PM. Continue current dose for now, continue glucose control. Memory appears stable. She is aware of [place] driving laws to stop driving after a seizure until 6 months seizure-free. Follow-up in 1 year, call for any changes.    Thank you for allowing me to participate in her care.  Please do not hesitate to call for any questions or concerns.  Ellouise Newer, M.D.   CC: Dr. Simon Rhein

## 2021-11-21 ENCOUNTER — Encounter: Payer: Self-pay | Admitting: Neurology

## 2021-11-21 ENCOUNTER — Ambulatory Visit (INDEPENDENT_AMBULATORY_CARE_PROVIDER_SITE_OTHER): Payer: Medicare Other | Admitting: Neurology

## 2021-11-21 VITALS — BP 119/81 | HR 79 | Ht 61.0 in | Wt 162.8 lb

## 2021-11-21 DIAGNOSIS — G40009 Localization-related (focal) (partial) idiopathic epilepsy and epileptic syndromes with seizures of localized onset, not intractable, without status epilepticus: Secondary | ICD-10-CM | POA: Diagnosis not present

## 2021-11-21 MED ORDER — LEVETIRACETAM 750 MG PO TABS: ORAL_TABLET | ORAL | 3 refills | Status: DC

## 2021-11-21 NOTE — Patient Instructions (Addendum)
Good to see you doing well. Continue Keppra (Levetiracetam) '750mg'$ : take 1 tablet in AM, 2 tablets in PM. Continue working on sugar control. Hopefully you get the incontinence situation addressed soon! Follow-up in 1 year, call for any changes.   Seizure Precautions: 1. If medication has been prescribed for you to prevent seizures, take it exactly as directed.  Do not stop taking the medicine without talking to your doctor first, even if you have not had a seizure in a long time.   2. Avoid activities in which a seizure would cause danger to yourself or to others.  Don't operate dangerous machinery, swim alone, or climb in high or dangerous places, such as on ladders, roofs, or girders.  Do not drive unless your doctor says you may.  3. If you have any warning that you may have a seizure, lay down in a safe place where you can't hurt yourself.    4.  No driving for 6 months from last seizure, as per [facility].   Please refer to the following link on the [facility] website for more information: [email or web address]   5.  Maintain good sleep hygiene. Avoid alcohol.  6.  Contact your doctor if you have any problems that may be related to the medicine you are taking.  7.  Call 911 and bring the patient back to the ED if:        A.  The seizure lasts longer than 5 minutes.       B.  The patient doesn't awaken shortly after the seizure  C.  The patient has new problems such as difficulty seeing, speaking or moving  D.  The patient was injured during the seizure  E.  The patient has a temperature over 102 F (39C)  F.  The patient vomited and now is having trouble breathing

## 2021-11-21 NOTE — Progress Notes (Signed)
NEUROLOGY FOLLOW UP OFFICE NOTE  Andrea [name] [id number] Jan 26, 1961  HISTORY OF PRESENT ILLNESS: I had the pleasure of seeing Andrea [name] in follow-up in the neurology clinic on 11/21/2021.  The patient was last seen a year ago. In June 2021, she had episodes of confusion in the setting on hypoglycemia, then had witnessed focal seizures with left facial twitching. She was somnolent on arrival to the ER with MRI brain showing restricted diffusion in the right medial frontal cortex, right insula, right hippocampus. EEG initially showed generalized bifrontal periodic epileptiform discharges, then right frontocentral periodic discharges. Repeat MRI brain with and without contrast done 03/2020 was unremarkable with interval resolution of restricted diffusion on the right hemisphere. Her 1-hour EEG in 12/2019 was normal.  Since her last visit, she continues to do well seizure-free since June 2021 on Levetiracetam '750mg'$  in AM, '1500mg'$  in PM without side effects. She denies any staring/unresponsive episodes, gaps in time, olfactory/gustatory hallucinations, focal numbness/tingling/weakness, myoclonic jerks, facial twitching. No headaches, dizziness, vision changes, no falls. Sleep is okay. She has some back pain and continues to deal with bowel incontinence for 3 years. She denies any hypoglycemic episodes, but glucose levels have been so-so. She thinks her last HbA1c was high.    History on Initial Assessment 01/09/2020: This is a pleasant 61 year old right-handed woman with a history of type I DM, hypothyroidism, presenting after hospital discharge for new onset seizures. 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 She was back in the ER on 6/20 again hypoglycemic  (glucose 30) with left-sided weakness. Family had not witnessed any seizure activity, however EMS on scene noted 2 focal seizures, given IV Versed and dextrose. She was somnolent and not following commands on arrival. MRI brain showed restricted diffusion in the right medial frontal cortex, right insual, and right hippocampus, no abnormal enhancement. Her prolonged EEG showed generalized, maximal bifrontal periodic epileptiform discharges initially, then right frontocentral periodic discharges. She had multiple episodes of left facial twitching with generalized theta-alpha activity. Seizures improved with Levetiracetam and clobazam, she continued to have left sensory hemineglect, EEG continued to show LPDs on the right hemisphere. She then had recurrence of facial twitching and had a malignancy workup, CT abdomen/pelvis noted sigmoid colonic wall thickening which could represent focal colitis versus colonic neoplasm. She had LP which was overall unremarkable except for elevated glucose. Pending autoimmune and paraneoplastic Mayo panel, she was started empirically on Solumedrol for 5 days. Panel has come back negative. She was discharged home on Levetiracetam '1500mg'$  BID and Clobazam '5mg'$  BID. They deny any further seizures since hospital discharge. She missed 2 appointments scheduled and follows up today, they report running out of seizure medications 3 weeks ago with no seizure recurrence. Glucose levels have stabilized, she has not had significant hypoglycemia like before. She denies any facial twitching, no focal numbness/tingling/weakness. Andrea [name] has not witnessed any staring/unresponsive episodes. Her main concern today is right hip/thigh pain, which started before her June admission. Andrea [name] is asking for memory evaluation. She started having mild memory changes prior to June, however memory changes have progressively worsened since then. She gets confused with dates and repeats herself several times. She says  "sometimes I forget." She stopped driving at the beginning of the year  because "I was just tired." She denied getting lost driving. She takes her own medications in the morning, her grandson helps her for the evening medications because she may forget. She remembers to pay the rent but gets confused, so Andrea [name] helps. She does not cook. 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 Her daughter had seizures and passed away. She had a normal birth and early development.  There is no history of febrile convulsions, CNS infections such as meningitis/encephalitis, significant traumatic brain injury, neurosurgical procedures.  Diagnostic Data: MRI brain in June 2021 showed restricted diffusion in the right medial frontal cortex, right insula, right hippocampus.  Repeat MRI brain with and without contrast done 03/2020 was unremarkable with interval resolution of restricted diffusion on the right hemisphere.   EEG in June 2021 initially showed generalized bifrontal periodic epileptiform discharges, then right frontocentral periodic discharges. 1-hour EEG in 12/2019 was normal.   Neuropsychological testing in November 2021 noted cognitive impairment mainly with frontal-subcortical variety with additional visuospatial findings. There was some lateralization (greater right hemispheric problems). 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   PAST MEDICAL HISTORY: Past Medical History:  Diagnosis Date   Diabetes mellitus without complication ([place])    Hyperlipidemia    Hypothyroidism    Thyroid disease     MEDICATIONS: Current Outpatient Medications on File Prior to Visit  Medication Sig Dispense Refill   aspirin EC 81 MG tablet Take 81 mg by mouth daily.     Cholecalciferol 125 MCG (5000 UT) TABS Take  5,000 Units by mouth daily.     insulin aspart (NOVOLOG) 100 UNIT/ML injection Inject 0-6 Units into the skin 3 (three) times daily with meals. 10 mL 11   insulin detemir (LEVEMIR) 100 UNIT/ML injection Inject 0.1 mLs (10 Units total) into the skin 2 (two) times daily. 10 mL 11   levETIRAcetam (KEPPRA) 750 MG tablet Take 1 tablet in AM, 2 tablets in PM 270 tablet 3   levonorgestrel (MIRENA) 20 MCG/24HR IUD 1 each by Intrauterine route once.      levothyroxine (SYNTHROID) 150 MCG tablet Take 150 mcg by mouth every morning.     liothyronine (CYTOMEL) 5 MCG tablet Take 5 mcg by mouth daily. Take in addition to levothyroxine.     lisinopril (PRINIVIL,ZESTRIL) 20 MG tablet Take 20 mg by mouth daily.     metoprolol tartrate (LOPRESSOR) 50 MG tablet Take 1 tablet (50 mg total) by mouth 2 (two) times daily.     potassium chloride SA (KLOR-CON) 20 MEQ tablet Take 1 tablet (20 mEq total) by mouth daily. (Patient taking differently: Take 20 mEq by mouth 2 (two) times daily.) 30 tablet 0   rosuvastatin (CRESTOR) 20 MG tablet Take 20 mg by mouth at bedtime.      [DISCONTINUED] pravastatin (PRAVACHOL) 40 MG tablet Take 40 mg by mouth every evening.      No current facility-administered medications on file prior to visit.    ALLERGIES: Allergies  Allergen Reactions   Latex Hives    FAMILY HISTORY: Family History  Problem Relation Age of Onset   Heart disease Father    Breast cancer Neg Hx     SOCIAL HISTORY: Social History   Socioeconomic History   Marital status: Single    Spouse name: Not on file   Number  of children: Not on file   Years of education: Not on file   Highest education level: Not on file  Occupational History   Not on file  Tobacco Use   Smoking status: Never   Smokeless tobacco: Never  Vaping Use   Vaping Use: Never used  Substance and Sexual Activity   Alcohol use: No   Drug use: No   Sexual activity: Not on file  Other Topics Concern   Not on file  Social  History Narrative   Right handed    Lives with family    Social Determinants of Health   Financial Resource Strain: Not on file  Food Insecurity: Not on file  Transportation Needs: Not on file  Physical Activity: Not on file  Stress: Not on file  Social Connections: Not on file  Intimate Partner Violence: Not on file     PHYSICAL EXAM: Vitals:   11/21/21 1432  BP: 119/81  Pulse: 79  SpO2: 98%   General: No acute distress Head:  Normocephalic/atraumatic Skin/Extremities: No rash, no edema Neurological Exam: alert and awake. No aphasia or dysarthria. Fund of knowledge is appropriate. Attention and concentration are normal.   Cranial nerves: Pupils equal, round. Extraocular movements intact with no nystagmus. Visual fields full.  No facial asymmetry.  Motor: Bulk and tone normal, muscle strength 5/5 throughout with no pronator drift.   Finger to nose testing intact.  Gait slow and cautious due to back pain, no ataxia   IMPRESSION: This is a pleasant 61 yo RH woman with a history of type I DM, hypothyroidism, new onset focal status epilepticus in the setting of hypoglycemia (BG 30 on arrival). EEG in June 2021 captured multiple episodes of left facial twitching with electrographic correlate. With clinical improvement, EEG continued to show lateralized periodic discharges over the right hemisphere, maximal over the right medial frontocentral region. Her MRI brain on 6/21 showed restricted diffusion in the right medical frontal cortex, right insula, and right hippocampus. LP unremarkable, autoimmune/paraneoplastic panel negative. She was treated empirically with 5 days of IV Solumedrol. Repeat MRI brain in 03/2020 showed interval resolution of right hemisphere changes, EEG was normal. She remains seizure-free since June 2021 on Levetiracetam '750mg'$  in AM, '1500mg'$  in PM. She wonders about weaning off medication, we discussed repeating an EEG, and if normal, we can try weaning off medication,  understanding risks of breakthrough seizure with any medication adjustment. We also discussed the importance of glucose control since her glucose levels were very low when she was in status epilepticus. She opts to stay on medication for now since glucose levels are still not optimal. Refills sent for Levetiracetam '750mg'$  1 tab in AM, 2 tabs in PM. She is aware of [place] driving laws to stop driving after a seizure until 6 months seizure-free. Follow-up in 1 year, call for any changes.   Thank you for allowing me to participate in her care.  Please do not hesitate to call for any questions or concerns.    Ellouise Newer, M.D.   CC: Dr. Simon Rhein

## 2021-12-29 ENCOUNTER — Other Ambulatory Visit: Payer: Self-pay | Admitting: Internal Medicine

## 2021-12-29 DIAGNOSIS — Z1231 Encounter for screening mammogram for malignant neoplasm of breast: Secondary | ICD-10-CM

## 2022-01-23 ENCOUNTER — Ambulatory Visit
Admission: RE | Admit: 2022-01-23 | Discharge: 2022-01-23 | Disposition: A | Discharge status: Home / Self Care | Payer: Medicare Other | Source: Ambulatory Visit | Attending: Internal Medicine | Admitting: Internal Medicine

## 2022-01-23 DIAGNOSIS — Z1231 Encounter for screening mammogram for malignant neoplasm of breast: Secondary | ICD-10-CM | POA: Diagnosis not present

## 2022-05-03 DIAGNOSIS — M810 Age-related osteoporosis without current pathological fracture: Secondary | ICD-10-CM | POA: Diagnosis not present

## 2022-05-03 DIAGNOSIS — E039 Hypothyroidism, unspecified: Secondary | ICD-10-CM | POA: Diagnosis not present

## 2022-05-03 DIAGNOSIS — E785 Hyperlipidemia, unspecified: Secondary | ICD-10-CM | POA: Diagnosis not present

## 2022-05-03 DIAGNOSIS — G5622 Lesion of ulnar nerve, left upper limb: Secondary | ICD-10-CM | POA: Diagnosis not present

## 2022-05-03 DIAGNOSIS — G40909 Epilepsy, unspecified, not intractable, without status epilepticus: Secondary | ICD-10-CM | POA: Diagnosis not present

## 2022-05-03 DIAGNOSIS — E1059 Type 1 diabetes mellitus with other circulatory complications: Secondary | ICD-10-CM | POA: Diagnosis not present

## 2022-05-03 DIAGNOSIS — E119 Type 2 diabetes mellitus without complications: Secondary | ICD-10-CM | POA: Diagnosis not present

## 2022-05-03 DIAGNOSIS — M549 Dorsalgia, unspecified: Secondary | ICD-10-CM | POA: Diagnosis not present

## 2022-05-03 DIAGNOSIS — F5104 Psychophysiologic insomnia: Secondary | ICD-10-CM | POA: Diagnosis not present

## 2022-05-03 DIAGNOSIS — Z23 Encounter for immunization: Secondary | ICD-10-CM | POA: Diagnosis not present

## 2022-05-03 DIAGNOSIS — F39 Unspecified mood [affective] disorder: Secondary | ICD-10-CM | POA: Diagnosis not present

## 2022-05-03 DIAGNOSIS — Z79899 Other long term (current) drug therapy: Secondary | ICD-10-CM | POA: Diagnosis not present

## 2022-05-03 DIAGNOSIS — E0500 Thyrotoxicosis with diffuse goiter without thyrotoxic crisis or storm: Secondary | ICD-10-CM | POA: Diagnosis not present

## 2022-05-05 ENCOUNTER — Encounter: Payer: Self-pay | Admitting: Neurology

## 2022-05-12 DIAGNOSIS — H401111 Primary open-angle glaucoma, right eye, mild stage: Secondary | ICD-10-CM | POA: Diagnosis not present

## 2022-05-19 DIAGNOSIS — E10649 Type 1 diabetes mellitus with hypoglycemia without coma: Secondary | ICD-10-CM | POA: Diagnosis not present

## 2022-05-23 DIAGNOSIS — H40052 Ocular hypertension, left eye: Secondary | ICD-10-CM | POA: Diagnosis not present

## 2022-06-16 DIAGNOSIS — H401111 Primary open-angle glaucoma, right eye, mild stage: Secondary | ICD-10-CM | POA: Diagnosis not present

## 2022-06-16 DIAGNOSIS — H40052 Ocular hypertension, left eye: Secondary | ICD-10-CM | POA: Diagnosis not present

## 2022-06-19 DIAGNOSIS — E10649 Type 1 diabetes mellitus with hypoglycemia without coma: Secondary | ICD-10-CM | POA: Diagnosis not present

## 2022-06-29 DIAGNOSIS — E10649 Type 1 diabetes mellitus with hypoglycemia without coma: Secondary | ICD-10-CM | POA: Diagnosis not present

## 2022-06-29 DIAGNOSIS — E785 Hyperlipidemia, unspecified: Secondary | ICD-10-CM | POA: Diagnosis not present

## 2022-06-29 DIAGNOSIS — E039 Hypothyroidism, unspecified: Secondary | ICD-10-CM | POA: Diagnosis not present

## 2022-06-29 DIAGNOSIS — E1069 Type 1 diabetes mellitus with other specified complication: Secondary | ICD-10-CM | POA: Diagnosis not present

## 2022-07-14 DIAGNOSIS — M5459 Other low back pain: Secondary | ICD-10-CM | POA: Diagnosis not present

## 2022-07-18 DIAGNOSIS — E10649 Type 1 diabetes mellitus with hypoglycemia without coma: Secondary | ICD-10-CM | POA: Diagnosis not present

## 2022-09-06 DIAGNOSIS — M6281 Muscle weakness (generalized): Secondary | ICD-10-CM | POA: Diagnosis not present

## 2022-09-06 DIAGNOSIS — R2689 Other abnormalities of gait and mobility: Secondary | ICD-10-CM | POA: Diagnosis not present

## 2022-09-06 DIAGNOSIS — M5459 Other low back pain: Secondary | ICD-10-CM | POA: Diagnosis not present

## 2022-10-02 DIAGNOSIS — G8929 Other chronic pain: Secondary | ICD-10-CM | POA: Diagnosis not present

## 2022-10-02 DIAGNOSIS — Z91040 Latex allergy status: Secondary | ICD-10-CM | POA: Diagnosis not present

## 2022-10-02 DIAGNOSIS — E119 Type 2 diabetes mellitus without complications: Secondary | ICD-10-CM | POA: Diagnosis not present

## 2022-10-02 DIAGNOSIS — E876 Hypokalemia: Secondary | ICD-10-CM | POA: Diagnosis not present

## 2022-10-02 DIAGNOSIS — Z794 Long term (current) use of insulin: Secondary | ICD-10-CM | POA: Diagnosis not present

## 2022-10-02 DIAGNOSIS — L03211 Cellulitis of face: Secondary | ICD-10-CM | POA: Diagnosis not present

## 2022-10-02 DIAGNOSIS — R590 Localized enlarged lymph nodes: Secondary | ICD-10-CM | POA: Diagnosis not present

## 2022-10-02 DIAGNOSIS — L0390 Cellulitis, unspecified: Secondary | ICD-10-CM | POA: Diagnosis not present

## 2022-10-02 DIAGNOSIS — K0889 Other specified disorders of teeth and supporting structures: Secondary | ICD-10-CM | POA: Diagnosis not present

## 2022-10-02 DIAGNOSIS — I10 Essential (primary) hypertension: Secondary | ICD-10-CM | POA: Diagnosis not present

## 2022-10-02 DIAGNOSIS — M542 Cervicalgia: Secondary | ICD-10-CM | POA: Diagnosis not present

## 2022-10-02 DIAGNOSIS — L03221 Cellulitis of neck: Secondary | ICD-10-CM | POA: Diagnosis not present

## 2022-10-02 DIAGNOSIS — Z7982 Long term (current) use of aspirin: Secondary | ICD-10-CM | POA: Diagnosis not present

## 2022-10-02 DIAGNOSIS — N39490 Overflow incontinence: Secondary | ICD-10-CM | POA: Diagnosis not present

## 2022-10-02 DIAGNOSIS — Z79899 Other long term (current) drug therapy: Secondary | ICD-10-CM | POA: Diagnosis not present

## 2022-10-02 DIAGNOSIS — T402X5A Adverse effect of other opioids, initial encounter: Secondary | ICD-10-CM | POA: Diagnosis not present

## 2022-10-02 DIAGNOSIS — E039 Hypothyroidism, unspecified: Secondary | ICD-10-CM | POA: Diagnosis not present

## 2022-10-02 DIAGNOSIS — G40901 Epilepsy, unspecified, not intractable, with status epilepticus: Secondary | ICD-10-CM | POA: Diagnosis not present

## 2022-10-02 DIAGNOSIS — K5900 Constipation, unspecified: Secondary | ICD-10-CM | POA: Diagnosis not present

## 2022-10-02 DIAGNOSIS — Z79890 Hormone replacement therapy (postmenopausal): Secondary | ICD-10-CM | POA: Diagnosis not present

## 2022-10-02 DIAGNOSIS — E890 Postprocedural hypothyroidism: Secondary | ICD-10-CM | POA: Diagnosis not present

## 2022-10-02 DIAGNOSIS — K047 Periapical abscess without sinus: Secondary | ICD-10-CM | POA: Diagnosis not present

## 2022-10-02 DIAGNOSIS — R220 Localized swelling, mass and lump, head: Secondary | ICD-10-CM | POA: Diagnosis not present

## 2022-10-02 DIAGNOSIS — A419 Sepsis, unspecified organism: Secondary | ICD-10-CM | POA: Diagnosis not present

## 2022-10-02 DIAGNOSIS — E785 Hyperlipidemia, unspecified: Secondary | ICD-10-CM | POA: Diagnosis not present

## 2022-10-03 DIAGNOSIS — L03211 Cellulitis of face: Secondary | ICD-10-CM | POA: Diagnosis not present

## 2022-10-03 DIAGNOSIS — E119 Type 2 diabetes mellitus without complications: Secondary | ICD-10-CM | POA: Diagnosis not present

## 2022-10-03 DIAGNOSIS — A419 Sepsis, unspecified organism: Secondary | ICD-10-CM | POA: Diagnosis not present

## 2022-10-03 DIAGNOSIS — K047 Periapical abscess without sinus: Secondary | ICD-10-CM | POA: Diagnosis not present

## 2022-10-04 DIAGNOSIS — A419 Sepsis, unspecified organism: Secondary | ICD-10-CM | POA: Diagnosis not present

## 2022-10-05 DIAGNOSIS — A419 Sepsis, unspecified organism: Secondary | ICD-10-CM | POA: Diagnosis not present

## 2022-10-20 DIAGNOSIS — E109 Type 1 diabetes mellitus without complications: Secondary | ICD-10-CM | POA: Diagnosis not present

## 2022-10-20 DIAGNOSIS — E0500 Thyrotoxicosis with diffuse goiter without thyrotoxic crisis or storm: Secondary | ICD-10-CM | POA: Diagnosis not present

## 2022-10-20 DIAGNOSIS — F5104 Psychophysiologic insomnia: Secondary | ICD-10-CM | POA: Diagnosis not present

## 2022-10-20 DIAGNOSIS — F39 Unspecified mood [affective] disorder: Secondary | ICD-10-CM | POA: Diagnosis not present

## 2022-10-20 DIAGNOSIS — H401111 Primary open-angle glaucoma, right eye, mild stage: Secondary | ICD-10-CM | POA: Diagnosis not present

## 2022-10-20 DIAGNOSIS — M810 Age-related osteoporosis without current pathological fracture: Secondary | ICD-10-CM | POA: Diagnosis not present

## 2022-10-20 DIAGNOSIS — E785 Hyperlipidemia, unspecified: Secondary | ICD-10-CM | POA: Diagnosis not present

## 2022-10-20 DIAGNOSIS — E039 Hypothyroidism, unspecified: Secondary | ICD-10-CM | POA: Diagnosis not present

## 2022-10-20 DIAGNOSIS — M549 Dorsalgia, unspecified: Secondary | ICD-10-CM | POA: Diagnosis not present

## 2022-10-20 DIAGNOSIS — E1159 Type 2 diabetes mellitus with other circulatory complications: Secondary | ICD-10-CM | POA: Diagnosis not present

## 2022-10-20 DIAGNOSIS — R911 Solitary pulmonary nodule: Secondary | ICD-10-CM | POA: Diagnosis not present

## 2022-10-20 DIAGNOSIS — Z7689 Persons encountering health services in other specified circumstances: Secondary | ICD-10-CM | POA: Diagnosis not present

## 2022-10-20 DIAGNOSIS — G5622 Lesion of ulnar nerve, left upper limb: Secondary | ICD-10-CM | POA: Diagnosis not present

## 2022-10-20 DIAGNOSIS — G40909 Epilepsy, unspecified, not intractable, without status epilepticus: Secondary | ICD-10-CM | POA: Diagnosis not present

## 2022-10-31 DIAGNOSIS — E10649 Type 1 diabetes mellitus with hypoglycemia without coma: Secondary | ICD-10-CM | POA: Diagnosis not present

## 2022-10-31 DIAGNOSIS — E1069 Type 1 diabetes mellitus with other specified complication: Secondary | ICD-10-CM | POA: Diagnosis not present

## 2022-10-31 DIAGNOSIS — E785 Hyperlipidemia, unspecified: Secondary | ICD-10-CM | POA: Diagnosis not present

## 2022-10-31 DIAGNOSIS — E039 Hypothyroidism, unspecified: Secondary | ICD-10-CM | POA: Diagnosis not present

## 2022-11-17 ENCOUNTER — Encounter: Payer: Self-pay | Admitting: Neurology

## 2022-11-17 ENCOUNTER — Ambulatory Visit (INDEPENDENT_AMBULATORY_CARE_PROVIDER_SITE_OTHER): Payer: Medicare (Managed Care) | Admitting: Neurology

## 2022-11-17 VITALS — BP 123/80 | HR 98 | Ht 61.0 in | Wt 181.0 lb

## 2022-11-17 DIAGNOSIS — G40009 Localization-related (focal) (partial) idiopathic epilepsy and epileptic syndromes with seizures of localized onset, not intractable, without status epilepticus: Secondary | ICD-10-CM

## 2022-11-17 MED ORDER — LEVETIRACETAM 750 MG PO TABS: ORAL_TABLET | ORAL | 3 refills | Status: DC

## 2022-11-17 NOTE — Progress Notes (Signed)
NEUROLOGY FOLLOW UP OFFICE NOTE  Siobhan Zaro [id number] 1960/12/05  HISTORY OF PRESENT ILLNESS: I had the pleasure of seeing Luiza Carranco in follow-up in the neurology clinic on 11/17/2022.  The patient was last seen a year ago for seizures. She is again accompanied by her granddaughter who helps supplement the history today.  Records and images were personally reviewed where available.  In June 2021, she had episodes of confusion in the setting on hypoglycemia, then had witnessed focal seizures with left facial twitching. She was somnolent on arrival to the ER with MRI brain showing restricted diffusion in the right medial frontal cortex, right insula, right hippocampus. EEG initially showed generalized bifrontal periodic epileptiform discharges, then right frontocentral periodic discharges. Repeat MRI brain with and without contrast done 03/2020 was unremarkable with interval resolution of restricted diffusion on the right hemisphere. Her 1-hour EEG in 12/2019 was normal.  Since her last visit, they deny any seizures since June 2021 on Levetiracetam 750mg  in AM, 1500mg  in PM without side effects. No facial twitching, confusion, staring/unresponsive episodes, gaps in time, olfactory/gustatory hallucinations, focal numbness/tingling/weakness, myoclonic jerks. No headaches, dizziness, vision changes, no falls. She has been having back pain. She reports constipation as well as bowel incontinence. No urinary issues. She gets 8-9 hours of sleep with a sleep aide. Mood is okay. Glucose levels have been low, her medications have been adjusted. She is driving. Memory is fine, she manages her own medications without issues.    History on Initial Assessment 01/09/2020: This is a pleasant 62 year old right-handed woman with a history of type I DM, hypothyroidism, presenting after hospital discharge for new onset seizures. 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 Dahlia Client  reports that she started having episodes of confusion earlier in June, they were back and forth to the hospital, she was in the ER for hypoglycemia of 24 on 6/19, non-verbal with generalized weakness. She was back in the ER on 6/20 again hypoglycemic (glucose 30) with left-sided weakness. Family had not witnessed any seizure activity, however EMS on scene noted 2 focal seizures, given IV Versed and dextrose. She was somnolent and not following commands on arrival. MRI brain showed restricted diffusion in the right medial frontal cortex, right insual, and right hippocampus, no abnormal enhancement. Her prolonged EEG showed generalized, maximal bifrontal periodic epileptiform discharges initially, then right frontocentral periodic discharges. She had multiple episodes of left facial twitching with generalized theta-alpha activity. Seizures improved with Levetiracetam and clobazam, she continued to have left sensory hemineglect, EEG continued to show LPDs on the right hemisphere. She then had recurrence of facial twitching and had a malignancy workup, CT abdomen/pelvis noted sigmoid colonic wall thickening which could represent focal colitis versus colonic neoplasm. She had LP which was overall unremarkable except for elevated glucose. Pending autoimmune and paraneoplastic [facility] panel, she was started empirically on Solumedrol for 5 days. Panel has come back negative. She was discharged home on Levetiracetam 1500mg  BID and Clobazam 5mg  BID. They deny any further seizures since hospital discharge. She missed 2 appointments scheduled and follows up today, they report running out of seizure medications 3 weeks ago with no seizure recurrence. Glucose levels have stabilized, she has not had significant hypoglycemia like before. She denies any facial twitching, no focal numbness/tingling/weakness. Dahlia Client has not witnessed any staring/unresponsive episodes. Her main concern today is right hip/thigh pain, which started before  her June admission. Dahlia Client is asking for memory evaluation. She started  having mild memory changes prior to June, however memory changes have progressively worsened since then. She gets confused with dates and repeats herself several times. She says "sometimes I forget." She stopped driving at the beginning of the year because "I was just tired." She denied getting lost driving. She takes her own medications in the morning, her grandson helps her for the evening medications because she may forget. She remembers to pay the rent but gets confused, so Dahlia Client helps. She does not cook. 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 Her daughter had seizures and passed away. She had a normal birth and early development.  There is no history of febrile convulsions, CNS infections such as meningitis/encephalitis, significant traumatic brain injury, neurosurgical procedures.  Diagnostic Data: MRI brain in June 2021 showed restricted diffusion in the right medial frontal cortex, right insula, right hippocampus.  Repeat MRI brain with and without contrast done 03/2020 was unremarkable with interval resolution of restricted diffusion on the right hemisphere.   EEG in June 2021 initially showed generalized bifrontal periodic epileptiform discharges, then right frontocentral periodic discharges. 1-hour EEG in 12/2019 was normal.   Neuropsychological testing in November 2021 noted cognitive impairment mainly with frontal-subcortical variety with additional visuospatial findings. There was some lateralization (greater right hemispheric problems). 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    PAST MEDICAL HISTORY: Past Medical History:  Diagnosis Date   Diabetes mellitus without complication (HCC)    Hyperlipidemia     Hypothyroidism    Thyroid disease     MEDICATIONS: Current Outpatient Medications on File Prior to Visit  Medication Sig Dispense Refill   aspirin EC 81 MG tablet Take 81 mg by mouth daily.     Cholecalciferol 125 MCG (5000 UT) TABS Take 5,000 Units by mouth daily.     insulin detemir (LEVEMIR) 100 UNIT/ML injection Inject 0.1 mLs (10 Units total) into the skin 2 (two) times daily. 10 mL 11   levETIRAcetam (KEPPRA) 750 MG tablet Take 1 tablet in AM, 2 tablets in PM 270 tablet 3   levonorgestrel (MIRENA) 20 MCG/24HR IUD 1 each by Intrauterine route once.      levothyroxine (SYNTHROID) 150 MCG tablet Take 150 mcg by mouth every morning.     liothyronine (CYTOMEL) 5 MCG tablet Take 5 mcg by mouth daily. Take in addition to levothyroxine.     lisinopril (PRINIVIL,ZESTRIL) 20 MG tablet Take 20 mg by mouth daily.     metoprolol tartrate (LOPRESSOR) 50 MG tablet Take 1 tablet (50 mg total) by mouth 2 (two) times daily.     potassium chloride SA (KLOR-CON) 20 MEQ tablet Take 1 tablet (20 mEq total) by mouth daily. (Patient taking differently: Take 20 mEq by mouth 2 (two) times daily.) 30 tablet 0   rosuvastatin (CRESTOR) 20 MG tablet Take 20 mg by mouth at bedtime.      insulin aspart (NOVOLOG) 100 UNIT/ML injection Inject 0-6 Units into the skin 3 (three) times daily with meals. (Patient not taking: Reported on 11/17/2022) 10 mL 11   [DISCONTINUED] pravastatin (PRAVACHOL) 40 MG tablet Take 40 mg by mouth every evening.      No current facility-administered medications on file prior to visit.    ALLERGIES: Allergies  Allergen Reactions   Latex Hives    FAMILY HISTORY: Family History  Problem Relation Age of Onset   Heart disease Father    Breast cancer Neg Hx     SOCIAL HISTORY: Social History   Socioeconomic History   Marital status: Single    Spouse name: Not on file   Number of children: Not on file   Years of education: Not on file   Highest education level: Not on file   Occupational History   Not on file  Tobacco Use   Smoking status: Never   Smokeless tobacco: Never  Vaping Use   Vaping status: Never Used  Substance and Sexual Activity   Alcohol use: No   Drug use: No   Sexual activity: Not on file  Other Topics Concern   Not on file  Social History Narrative   Right handed    Lives with family    Social Determinants of Health   Financial Resource Strain: Not on file  Food Insecurity: Not on file  Transportation Needs: Not on file  Physical Activity: Not on file  Stress: Not on file  Social Connections: Not on file  Intimate Partner Violence: Not on file     PHYSICAL EXAM: Vitals:   11/17/22 1453  BP: 123/80  Pulse: 98  SpO2: 98%   General: No acute distress Head:  Normocephalic/atraumatic Skin/Extremities: No rash, no edema Neurological Exam: alert and awake. No aphasia or dysarthria. Fund of knowledge is appropriate.  Attention and concentration are normal.   Cranial nerves: Pupils equal, round. Extraocular movements intact with no nystagmus. Visual fields full.  No facial asymmetry.  Motor: Bulk and tone normal, muscle strength 5/5 throughout with no pronator drift.   Finger to nose testing intact.  Gait narrow-based and steady, no ataxia. Negative Romberg test.   IMPRESSION: This is a pleasant 62 yo RH woman with a history of type I DM, hypothyroidism, new onset focal status epilepticus in the setting of hypoglycemia (BG 30 on arrival). EEG in June 2021 captured multiple episodes of left facial twitching with electrographic correlate. With clinical improvement, EEG continued to show lateralized periodic discharges over the right hemisphere, maximal over the right medial frontocentral region. Her MRI brain on 6/21 showed restricted diffusion in the right medical frontal cortex, right insula, and right hippocampus. LP unremarkable, autoimmune/paraneoplastic panel negative. She was treated empirically with 5 days of IV Solumedrol.  Repeat MRI brain in 03/2020 showed interval resolution of right hemisphere changes, EEG was normal. She has been seizure-free since June 2021 on Levetiracetam 750mg  in AM, 1500mg  in PM. They report glucose levels have been low, discussed that would continue seizure medication as glucose levels continue to be optimized since glucose levels were very low when she was in status epilepticus. She is aware of [place] driving laws to stop driving after a seizure until 6 months seizure-free. Follow-up in 1 year, call for any changes.    Thank you for allowing me to participate in her care.  Please do not hesitate to call for any questions or concerns.    Patrcia Dolly, M.D.   CC: Dr. Birder Robson

## 2022-11-17 NOTE — Patient Instructions (Signed)
Good to see you doing well. Continue Levetiracetam (Keppra) 750mg : take 1 tablet in AM, 2 tablets in PM. Continue working on glucose level control. Follow-up in 1 year, call for any changes.    Seizure Precautions: 1. If medication has been prescribed for you to prevent seizures, take it exactly as directed.  Do not stop taking the medicine without talking to your doctor first, even if you have not had a seizure in a long time.   2. Avoid activities in which a seizure would cause danger to yourself or to others.  Don't operate dangerous machinery, swim alone, or climb in high or dangerous places, such as on ladders, roofs, or girders.  Do not drive unless your doctor says you may.  3. If you have any warning that you may have a seizure, lay down in a safe place where you can't hurt yourself.    4.  No driving for 6 months from last seizure, as per [facility].   Please refer to the following link on the [employer]'s website for more information: http://www.epilepsyfoundation.org/answerplace/Social/driving/drivingu.cfm   5.  Maintain good sleep hygiene. Avoid alcohol.  6.  Contact your doctor if you have any problems that may be related to the medicine you are taking.  7.  Call 911 and bring the patient back to the ED if:        A.  The seizure lasts longer than 5 minutes.       B.  The patient doesn't awaken shortly after the seizure  C.  The patient has new problems such as difficulty seeing, speaking or moving  D.  The patient was injured during the seizure  E.  The patient has a temperature over 102 F (39C)  F.  The patient vomited and now is having trouble breathing

## 2022-11-22 ENCOUNTER — Ambulatory Visit: Payer: Medicare Other | Admitting: Neurology

## 2022-12-05 DIAGNOSIS — E10649 Type 1 diabetes mellitus with hypoglycemia without coma: Secondary | ICD-10-CM | POA: Diagnosis not present

## 2022-12-19 DIAGNOSIS — I10 Essential (primary) hypertension: Secondary | ICD-10-CM | POA: Diagnosis not present

## 2022-12-19 DIAGNOSIS — K649 Unspecified hemorrhoids: Secondary | ICD-10-CM | POA: Diagnosis not present

## 2022-12-19 DIAGNOSIS — E109 Type 1 diabetes mellitus without complications: Secondary | ICD-10-CM | POA: Diagnosis not present

## 2022-12-19 DIAGNOSIS — Z91040 Latex allergy status: Secondary | ICD-10-CM | POA: Diagnosis not present

## 2022-12-19 DIAGNOSIS — E785 Hyperlipidemia, unspecified: Secondary | ICD-10-CM | POA: Diagnosis not present

## 2022-12-19 DIAGNOSIS — Z87891 Personal history of nicotine dependence: Secondary | ICD-10-CM | POA: Diagnosis not present

## 2022-12-19 DIAGNOSIS — E079 Disorder of thyroid, unspecified: Secondary | ICD-10-CM | POA: Diagnosis not present

## 2022-12-19 DIAGNOSIS — K6289 Other specified diseases of anus and rectum: Secondary | ICD-10-CM | POA: Diagnosis not present

## 2022-12-19 DIAGNOSIS — R000 Tachycardia, unspecified: Secondary | ICD-10-CM | POA: Diagnosis not present

## 2023-01-04 ENCOUNTER — Other Ambulatory Visit: Payer: Self-pay | Admitting: Internal Medicine

## 2023-01-04 DIAGNOSIS — Z1231 Encounter for screening mammogram for malignant neoplasm of breast: Secondary | ICD-10-CM

## 2023-02-27 ENCOUNTER — Ambulatory Visit
Admission: RE | Admit: 2023-02-27 | Discharge: 2023-02-27 | Disposition: A | Discharge status: Home / Self Care | Payer: Medicare HMO | Source: Ambulatory Visit | Attending: Internal Medicine | Admitting: Internal Medicine

## 2023-02-27 DIAGNOSIS — Z1231 Encounter for screening mammogram for malignant neoplasm of breast: Secondary | ICD-10-CM | POA: Diagnosis present

## 2023-10-30 ENCOUNTER — Ambulatory Visit: Payer: Medicare (Managed Care) | Admitting: Neurology

## 2023-10-30 ENCOUNTER — Encounter: Payer: Self-pay | Admitting: Neurology

## 2023-10-30 VITALS — BP 140/86 | HR 86 | Ht 61.0 in | Wt 180.0 lb

## 2023-10-30 DIAGNOSIS — G40009 Localization-related (focal) (partial) idiopathic epilepsy and epileptic syndromes with seizures of localized onset, not intractable, without status epilepticus: Secondary | ICD-10-CM | POA: Diagnosis not present

## 2023-10-30 MED ORDER — LEVETIRACETAM 750 MG PO TABS: ORAL_TABLET | ORAL | 4 refills | Status: AC

## 2023-10-30 NOTE — Patient Instructions (Signed)
 Good to see you doing well! Continue Keppra  (Levetiracetam ) 750mg : take 1 tablet in AM, 2 tablets in PM. Continue glucose control. Follow-up in 1 year, call for any changes.    Seizure Precautions: 1. If medication has been prescribed for you to prevent seizures, take it exactly as directed.  Do not stop taking the medicine without talking to your doctor first, even if you have not had a seizure in a long time.   2. Avoid activities in which a seizure would cause danger to yourself or to others.  Don't operate dangerous machinery, swim alone, or climb in high or dangerous places, such as on ladders, roofs, or girders.  Do not drive unless your doctor says you may.  3. If you have any warning that you may have a seizure, lay down in a safe place where you can't hurt yourself.    4.  No driving for 6 months from last seizure, as per   state law.   Please refer to the following link on the [employer]'s website for more information: http://www.epilepsyfoundation.org/answerplace/Social/driving/drivingu.cfm   5.  Maintain good sleep hygiene. Avoid alcohol.  6.  Contact your doctor if you have any problems that may be related to the medicine you are taking.  7.  Call 911 and bring the patient back to the ED if:        A.  The seizure lasts longer than 5 minutes.       B.  The patient doesn't awaken shortly after the seizure  C.  The patient has new problems such as difficulty seeing, speaking or moving  D.  The patient was injured during the seizure  E.  The patient has a temperature over 102 F (39C)  F.  The patient vomited and now is having trouble breathing

## 2023-10-30 NOTE — Progress Notes (Signed)
 NEUROLOGY FOLLOW UP OFFICE NOTE  Andrea [name] [id number] 1960-09-16  HISTORY OF PRESENT ILLNESS: I had the pleasure of seeing Andrea [name] in follow-up in the neurology clinic on 10/30/2023.  The patient was last seen a year ago for seizures. She is alone in the office today. Records and images were personally reviewed where available. In June 2021, she had episodes of confusion in the setting on hypoglycemia, then had witnessed focal seizures with left facial twitching. She was somnolent on arrival to the ER with MRI brain showing restricted diffusion in the right medial frontal cortex, right insula, right hippocampus. EEG initially showed generalized bifrontal periodic epileptiform discharges, then right frontocentral periodic discharges. Repeat MRI brain with and without contrast done 03/2020 was unremarkable with interval resolution of restricted diffusion on the right hemisphere. Her 1-hour EEG in 12/2019 was normal.  She has been doing well seizure-free since June 2021 on Levetiracetam  750mg  in AM, 1500mg  in PM without side effects. She denies any staring/unresponsive episodes, gaps in time, olfactory/gustatory hallucinations, focal numbness/tingling/weakness, myoclonic jerks. She had a headache lasting 3 daysoff and on over the left occipital region where she was feeling a tapping sensation 2 weeks ago, no nausea/vomiting. It has not recurred. No dizziness, vision changes, no falls. Glucose levels can still go up and down, but not as low as before. She gets 6-8 hours of sleep. Mood is fine. She manages her own medications and denies missing medication. She drives some. She lives with her grandchildren.   History on Initial Assessment 01/09/2020: This is a pleasant 63 year old right-handed woman with a history of type I DM, hypothyroidism, presenting after hospital discharge for new onset seizures. She is alone in the office today. 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 Andrea [name] reports that  she started having episodes of confusion earlier in June, they were back and forth to the hospital, she was in the ER for hypoglycemia of 24 on 6/19, non-verbal with generalized weakness. She was back in the ER on 6/20 again hypoglycemic (glucose 30) with left-sided weakness. Family had not witnessed any seizure activity, however EMS on scene noted 2 focal seizures, given IV Versed  and dextrose . She was somnolent and not following commands on arrival. MRI brain showed restricted diffusion in the right medial frontal cortex, right insual, and right hippocampus, no abnormal enhancement. Her prolonged EEG showed generalized, maximal bifrontal periodic epileptiform discharges initially, then right frontocentral periodic discharges. She had multiple episodes of left facial twitching with generalized theta-alpha activity. Seizures improved with Levetiracetam  and clobazam , she continued to have left sensory hemineglect, EEG continued to show LPDs on the right hemisphere. She then had recurrence of facial twitching and had a malignancy workup, CT abdomen/pelvis noted sigmoid colonic wall thickening which could represent focal colitis versus colonic neoplasm. She had LP which was overall unremarkable except for elevated glucose. Pending autoimmune and paraneoplastic [facility] panel, she was started empirically on Solumedrol for 5 days. Panel has come back negative. She was discharged home on Levetiracetam  1500mg  BID and Clobazam  5mg  BID. They deny any further seizures since hospital discharge. She missed 2 appointments scheduled and follows up today, they report running out of seizure medications 3 weeks ago with no seizure recurrence. Glucose levels have stabilized, she has not had significant hypoglycemia like before. She denies any facial twitching, no focal numbness/tingling/weakness. Andrea [name] has not witnessed any staring/unresponsive episodes. Her main concern today is right hip/thigh pain, which started before her June  admission. Andrea [name] is asking for  memory evaluation. She started having mild memory changes prior to June, however memory changes have progressively worsened since then. She gets confused with dates and repeats herself several times. She says sometimes I forget. She stopped driving at the beginning of the year because I was just tired. She denied getting lost driving. She takes her own medications in the morning, her grandson helps her for the evening medications because she may forget. She remembers to pay the rent but gets confused, so Andrea [name] helps. She does not cook. 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 Her daughter had seizures and passed away. She had a normal birth and early development.  There is no history of febrile convulsions, CNS infections such as meningitis/encephalitis, significant traumatic brain injury, neurosurgical procedures.  Diagnostic Data: MRI brain in June 2021 showed restricted diffusion in the right medial frontal cortex, right insula, right hippocampus.  Repeat MRI brain with and without contrast done 03/2020 was unremarkable with interval resolution of restricted diffusion on the right hemisphere.   EEG in June 2021 initially showed generalized bifrontal periodic epileptiform discharges, then right frontocentral periodic discharges. 1-hour EEG in 12/2019 was normal.   Neuropsychological testing in November 2021 noted cognitive impairment mainly with frontal-subcortical variety with additional visuospatial findings. There was some lateralization (greater right hemispheric problems). 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    PAST MEDICAL HISTORY: Past Medical History:  Diagnosis Date   Diabetes mellitus without complication (HCC)    Hyperlipidemia     Hypothyroidism    Thyroid  disease     MEDICATIONS: Current Outpatient Medications on File Prior to Visit  Medication Sig Dispense Refill   aspirin  EC 81 MG tablet Take 81 mg by mouth daily.     Cholecalciferol  125 MCG (5000 UT) TABS Take 5,000 Units by mouth daily.     insulin  aspart (NOVOLOG ) 100 UNIT/ML injection Inject 0-6 Units into the skin 3 (three) times daily with meals. 10 mL 11   levETIRAcetam  (KEPPRA ) 750 MG tablet Take 1 tablet in AM, 2 tablets in PM 270 tablet 3   levonorgestrel  (MIRENA ) 20 MCG/24HR IUD 1 each by Intrauterine route once.      levothyroxine  (SYNTHROID ) 150 MCG tablet Take 150 mcg by mouth every morning.     liothyronine  (CYTOMEL ) 5 MCG tablet Take 5 mcg by mouth daily. Take in addition to levothyroxine .     lisinopril  (PRINIVIL ,ZESTRIL ) 20 MG tablet Take 20 mg by mouth daily.     metoprolol  tartrate (LOPRESSOR ) 50 MG tablet Take 1 tablet (50 mg total) by mouth 2 (two) times daily.     potassium chloride  SA (KLOR-CON ) 20 MEQ tablet Take 1 tablet (20 mEq total) by mouth daily. 30 tablet 0   rosuvastatin  (CRESTOR ) 20 MG tablet Take 20 mg by mouth at bedtime.      TRESIBA FLEXTOUCH 200 UNIT/ML FlexTouch Pen Inject 50 Units into the skin daily at 10 pm.     insulin  detemir (LEVEMIR ) 100 UNIT/ML injection Inject 0.1 mLs (10 Units total) into the skin 2 (two) times daily. (Patient not taking: Reported on 10/30/2023) 10 mL 11   [DISCONTINUED] pravastatin  (PRAVACHOL ) 40 MG tablet Take 40 mg by mouth every evening.      No current facility-administered medications on file prior to visit.    ALLERGIES: Allergies  Allergen  Reactions   Latex Hives    FAMILY HISTORY: Family History  Problem Relation Age of Onset   Heart disease Father    Breast cancer Neg Hx     SOCIAL HISTORY: Social History   Socioeconomic History   Marital status: Single    Spouse name: Not on file   Number of children: Not on file   Years of education: Not on file   Highest education  level: Not on file  Occupational History   Not on file  Tobacco Use   Smoking status: Never   Smokeless tobacco: Never  Vaping Use   Vaping status: Never Used  Substance and Sexual Activity   Alcohol use: No   Drug use: No   Sexual activity: Not on file  Other Topics Concern   Not on file  Social History Narrative   Right handed    Lives with family    Social Drivers of Health   Financial Resource Strain: Not on file  Food Insecurity: Not on file  Transportation Needs: Not on file  Physical Activity: Not on file  Stress: Not on file  Social Connections: Not on file  Intimate Partner Violence: Not on file     PHYSICAL EXAM: Vitals:   10/30/23 1402 10/30/23 1436  BP: (!) 152/83 (!) 140/86  Pulse: 86   SpO2: 99%    General: No acute distress Head:  Normocephalic/atraumatic Skin/Extremities: No rash, no edema Neurological Exam: alert and awake. No aphasia or dysarthria. Fund of knowledge is appropriate.  Attention and concentration are normal.   Cranial nerves: Pupils equal, round. Extraocular movements intact with no nystagmus. Visual fields full.  No facial asymmetry.  Motor: Bulk and tone normal, muscle strength 5/5 throughout with no pronator drift.   Finger to nose testing intact.  Gait narrow-based and steady, mild difficulty with tandem walk but able. Romberg negative.   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 EEG in June 2021 captured multiple episodes of left facial twitching with electrographic correlate. With clinical improvement, EEG continued to show lateralized periodic discharges over the right hemisphere, maximal over the right medial frontocentral region. Her MRI brain on 6/21 showed restricted diffusion in the right medical frontal cortex, right insula, and right hippocampus. LP unremarkable, autoimmune/paraneoplastic panel  negative. She was treated empirically with 5 days of IV Solumedrol. Repeat MRI brain in 03/2020 showed interval resolution of right hemisphere changes, EEG was normal. She continues to do well seizure-free since 2021 on Levetiracetam  750mg  in AM, 1500mg  in PM, refills sent. Continue working on glucose control. She is aware of [place] driving laws to stop driving after a seizure until 6 months seizure-free. Follow-up in 1 year, call for any changes.  Thank you for allowing me to participate in her care.  Please do not hesitate to call for any questions or concerns.    Darice Shivers, M.D.   CC: Dr. Eward

## 2024-01-25 ENCOUNTER — Other Ambulatory Visit: Payer: Self-pay

## 2024-01-25 DIAGNOSIS — Z1231 Encounter for screening mammogram for malignant neoplasm of breast: Secondary | ICD-10-CM

## 2024-02-29 ENCOUNTER — Ambulatory Visit
Admission: RE | Admit: 2024-02-29 | Discharge: 2024-02-29 | Disposition: A | Discharge status: Home / Self Care | Source: Ambulatory Visit

## 2024-02-29 DIAGNOSIS — Z1231 Encounter for screening mammogram for malignant neoplasm of breast: Secondary | ICD-10-CM | POA: Diagnosis present

## 2024-03-19 ENCOUNTER — Encounter: Payer: Self-pay | Admitting: Neurology

## 2024-10-29 ENCOUNTER — Ambulatory Visit: Admitting: Neurology
